# Patient Record
Sex: Female | Born: 1955
Health system: Southern US, Community
[De-identification: ages and names within clinical notes are randomized; demographics above are authoritative.]

## PROBLEM LIST (undated history)

## (undated) DIAGNOSIS — T4145XA Adverse effect of unspecified anesthetic, initial encounter: Secondary | ICD-10-CM

## (undated) DIAGNOSIS — E785 Hyperlipidemia, unspecified: Secondary | ICD-10-CM

## (undated) DIAGNOSIS — M542 Cervicalgia: Secondary | ICD-10-CM

## (undated) DIAGNOSIS — C8590 Non-Hodgkin lymphoma, unspecified, unspecified site: Secondary | ICD-10-CM

## (undated) DIAGNOSIS — F3289 Other specified depressive episodes: Secondary | ICD-10-CM

## (undated) DIAGNOSIS — G8929 Other chronic pain: Secondary | ICD-10-CM

## (undated) DIAGNOSIS — F329 Major depressive disorder, single episode, unspecified: Secondary | ICD-10-CM

## (undated) DIAGNOSIS — I10 Essential (primary) hypertension: Secondary | ICD-10-CM

## (undated) DIAGNOSIS — D72820 Lymphocytosis (symptomatic): Secondary | ICD-10-CM

## (undated) DIAGNOSIS — F419 Anxiety disorder, unspecified: Secondary | ICD-10-CM

## (undated) DIAGNOSIS — K429 Umbilical hernia without obstruction or gangrene: Secondary | ICD-10-CM

## (undated) DIAGNOSIS — R109 Unspecified abdominal pain: Secondary | ICD-10-CM

## (undated) DIAGNOSIS — T8859XA Other complications of anesthesia, initial encounter: Secondary | ICD-10-CM

## (undated) DIAGNOSIS — Z87442 Personal history of urinary calculi: Secondary | ICD-10-CM

## (undated) DIAGNOSIS — K5792 Diverticulitis of intestine, part unspecified, without perforation or abscess without bleeding: Secondary | ICD-10-CM

## (undated) DIAGNOSIS — M5030 Other cervical disc degeneration, unspecified cervical region: Secondary | ICD-10-CM

## (undated) DIAGNOSIS — M25532 Pain in left wrist: Secondary | ICD-10-CM

## (undated) DIAGNOSIS — N390 Urinary tract infection, site not specified: Secondary | ICD-10-CM

## (undated) HISTORY — DX: Other cervical disc degeneration, unspecified cervical region: M50.30

## (undated) HISTORY — DX: Major depressive disorder, single episode, unspecified: F32.9

## (undated) HISTORY — DX: Urinary tract infection, site not specified: N39.0

## (undated) HISTORY — DX: Other complications of anesthesia, initial encounter: T88.59XA

## (undated) HISTORY — DX: Adverse effect of unspecified anesthetic, initial encounter: T41.45XA

## (undated) HISTORY — DX: Non-Hodgkin lymphoma, unspecified, unspecified site: C85.90

## (undated) HISTORY — DX: Hyperlipidemia, unspecified: E78.5

## (undated) HISTORY — DX: Lymphocytosis (symptomatic): D72.820

## (undated) HISTORY — DX: Other specified depressive episodes: F32.89

## (undated) HISTORY — DX: Personal history of urinary calculi: Z87.442

---

## 1979-10-06 HISTORY — PX: MYOMECTOMY: SHX85

## 1979-10-06 HISTORY — PX: APPENDECTOMY: SHX54

## 1999-01-07 ENCOUNTER — Other Ambulatory Visit: Admission: RE | Admit: 1999-01-07 | Discharge: 1999-01-07 | Payer: Self-pay | Admitting: Obstetrics and Gynecology

## 1999-10-06 HISTORY — PX: ABDOMINAL HYSTERECTOMY: SHX81

## 1999-10-06 HISTORY — PX: LEFT OOPHORECTOMY: SHX1961

## 2000-04-23 ENCOUNTER — Encounter: Payer: Self-pay | Admitting: Emergency Medicine

## 2000-04-23 ENCOUNTER — Emergency Department (HOSPITAL_COMMUNITY): Admission: EM | Admit: 2000-04-23 | Discharge: 2000-04-23 | Payer: Self-pay | Admitting: Emergency Medicine

## 2000-10-24 ENCOUNTER — Inpatient Hospital Stay (HOSPITAL_COMMUNITY): Admission: AD | Admit: 2000-10-24 | Discharge: 2000-10-24 | Payer: Self-pay | Admitting: Obstetrics and Gynecology

## 2000-11-09 ENCOUNTER — Inpatient Hospital Stay (HOSPITAL_COMMUNITY): Admission: RE | Admit: 2000-11-09 | Discharge: 2000-11-11 | Payer: Self-pay | Admitting: Obstetrics and Gynecology

## 2000-11-09 ENCOUNTER — Encounter (INDEPENDENT_AMBULATORY_CARE_PROVIDER_SITE_OTHER): Payer: Self-pay | Admitting: Specialist

## 2000-11-09 ENCOUNTER — Encounter (INDEPENDENT_AMBULATORY_CARE_PROVIDER_SITE_OTHER): Payer: Self-pay

## 2001-01-26 ENCOUNTER — Other Ambulatory Visit: Admission: RE | Admit: 2001-01-26 | Discharge: 2001-01-26 | Payer: Self-pay | Admitting: Obstetrics and Gynecology

## 2001-04-04 ENCOUNTER — Encounter: Payer: Self-pay | Admitting: Emergency Medicine

## 2001-04-04 ENCOUNTER — Emergency Department (HOSPITAL_COMMUNITY): Admission: EM | Admit: 2001-04-04 | Discharge: 2001-04-04 | Payer: Self-pay | Admitting: Emergency Medicine

## 2001-06-12 ENCOUNTER — Emergency Department (HOSPITAL_COMMUNITY): Admission: EM | Admit: 2001-06-12 | Discharge: 2001-06-13 | Payer: Self-pay

## 2001-08-21 ENCOUNTER — Emergency Department (HOSPITAL_COMMUNITY): Admission: EM | Admit: 2001-08-21 | Discharge: 2001-08-21 | Payer: Self-pay | Admitting: Emergency Medicine

## 2001-08-21 ENCOUNTER — Encounter: Payer: Self-pay | Admitting: Emergency Medicine

## 2001-10-06 ENCOUNTER — Emergency Department (HOSPITAL_COMMUNITY): Admission: EM | Admit: 2001-10-06 | Discharge: 2001-10-06 | Payer: Self-pay | Admitting: Emergency Medicine

## 2002-02-02 ENCOUNTER — Emergency Department (HOSPITAL_COMMUNITY): Admission: EM | Admit: 2002-02-02 | Discharge: 2002-02-02 | Payer: Self-pay | Admitting: Emergency Medicine

## 2002-02-02 ENCOUNTER — Encounter: Payer: Self-pay | Admitting: Emergency Medicine

## 2002-04-28 ENCOUNTER — Encounter: Admission: RE | Admit: 2002-04-28 | Discharge: 2002-04-28 | Payer: Self-pay | Admitting: Internal Medicine

## 2002-06-13 ENCOUNTER — Encounter: Admission: RE | Admit: 2002-06-13 | Discharge: 2002-06-13 | Payer: Self-pay | Admitting: Internal Medicine

## 2002-06-23 ENCOUNTER — Encounter: Payer: Self-pay | Admitting: Emergency Medicine

## 2002-06-23 ENCOUNTER — Emergency Department (HOSPITAL_COMMUNITY): Admission: EM | Admit: 2002-06-23 | Discharge: 2002-06-23 | Payer: Self-pay | Admitting: Emergency Medicine

## 2002-08-25 ENCOUNTER — Emergency Department (HOSPITAL_COMMUNITY): Admission: EM | Admit: 2002-08-25 | Discharge: 2002-08-25 | Payer: Self-pay | Admitting: Emergency Medicine

## 2002-09-05 ENCOUNTER — Emergency Department (HOSPITAL_COMMUNITY): Admission: EM | Admit: 2002-09-05 | Discharge: 2002-09-05 | Payer: Self-pay | Admitting: Emergency Medicine

## 2002-09-05 ENCOUNTER — Encounter: Payer: Self-pay | Admitting: Emergency Medicine

## 2002-10-02 ENCOUNTER — Other Ambulatory Visit: Admission: RE | Admit: 2002-10-02 | Discharge: 2002-10-02 | Payer: Self-pay | Admitting: Internal Medicine

## 2002-11-11 ENCOUNTER — Encounter: Admission: RE | Admit: 2002-11-11 | Discharge: 2002-11-11 | Payer: Self-pay | Admitting: Internal Medicine

## 2002-11-18 ENCOUNTER — Encounter: Payer: Self-pay | Admitting: Internal Medicine

## 2002-11-18 ENCOUNTER — Encounter: Admission: RE | Admit: 2002-11-18 | Discharge: 2002-11-18 | Payer: Self-pay | Admitting: Internal Medicine

## 2003-01-10 ENCOUNTER — Encounter: Admission: RE | Admit: 2003-01-10 | Discharge: 2003-01-10 | Payer: Self-pay | Admitting: Family Medicine

## 2003-01-10 ENCOUNTER — Encounter: Payer: Self-pay | Admitting: Family Medicine

## 2003-02-12 ENCOUNTER — Emergency Department (HOSPITAL_COMMUNITY): Admission: EM | Admit: 2003-02-12 | Discharge: 2003-02-12 | Payer: Self-pay | Admitting: Emergency Medicine

## 2003-02-12 ENCOUNTER — Encounter: Payer: Self-pay | Admitting: Emergency Medicine

## 2003-09-20 ENCOUNTER — Emergency Department (HOSPITAL_COMMUNITY): Admission: EM | Admit: 2003-09-20 | Discharge: 2003-09-20 | Payer: Self-pay | Admitting: Emergency Medicine

## 2004-01-15 ENCOUNTER — Other Ambulatory Visit: Admission: RE | Admit: 2004-01-15 | Discharge: 2004-01-15 | Payer: Self-pay | Admitting: Obstetrics and Gynecology

## 2004-01-25 ENCOUNTER — Encounter: Admission: RE | Admit: 2004-01-25 | Discharge: 2004-01-25 | Payer: Self-pay | Admitting: Obstetrics and Gynecology

## 2004-05-07 ENCOUNTER — Emergency Department (HOSPITAL_COMMUNITY): Admission: EM | Admit: 2004-05-07 | Discharge: 2004-05-07 | Payer: Self-pay | Admitting: Emergency Medicine

## 2004-08-16 ENCOUNTER — Emergency Department (HOSPITAL_COMMUNITY): Admission: EM | Admit: 2004-08-16 | Discharge: 2004-08-16 | Payer: Self-pay | Admitting: Family Medicine

## 2004-12-08 ENCOUNTER — Emergency Department (HOSPITAL_COMMUNITY): Admission: EM | Admit: 2004-12-08 | Discharge: 2004-12-08 | Payer: Self-pay | Admitting: Emergency Medicine

## 2004-12-15 ENCOUNTER — Encounter: Admission: RE | Admit: 2004-12-15 | Discharge: 2004-12-15 | Payer: Self-pay | Admitting: Occupational Medicine

## 2004-12-23 ENCOUNTER — Encounter: Admission: RE | Admit: 2004-12-23 | Discharge: 2005-01-30 | Payer: Self-pay | Admitting: Occupational Medicine

## 2005-08-23 ENCOUNTER — Emergency Department (HOSPITAL_COMMUNITY): Admission: EM | Admit: 2005-08-23 | Discharge: 2005-08-23 | Payer: Self-pay | Admitting: Emergency Medicine

## 2005-08-25 ENCOUNTER — Inpatient Hospital Stay (HOSPITAL_COMMUNITY): Admission: AD | Admit: 2005-08-25 | Discharge: 2005-08-27 | Payer: Self-pay | Admitting: Internal Medicine

## 2005-10-05 HISTORY — PX: KIDNEY STONE SURGERY: SHX686

## 2006-12-09 ENCOUNTER — Inpatient Hospital Stay (HOSPITAL_COMMUNITY): Admission: EM | Admit: 2006-12-09 | Discharge: 2006-12-09 | Payer: Self-pay | Admitting: Emergency Medicine

## 2006-12-23 ENCOUNTER — Ambulatory Visit (HOSPITAL_COMMUNITY): Admission: RE | Admit: 2006-12-23 | Discharge: 2006-12-23 | Payer: Self-pay | Admitting: Urology

## 2007-01-30 ENCOUNTER — Emergency Department (HOSPITAL_COMMUNITY): Admission: EM | Admit: 2007-01-30 | Discharge: 2007-01-30 | Payer: Self-pay | Admitting: Emergency Medicine

## 2007-02-04 ENCOUNTER — Ambulatory Visit (HOSPITAL_BASED_OUTPATIENT_CLINIC_OR_DEPARTMENT_OTHER): Admission: RE | Admit: 2007-02-04 | Discharge: 2007-02-04 | Payer: Self-pay | Admitting: Urology

## 2007-05-10 ENCOUNTER — Encounter: Admission: RE | Admit: 2007-05-10 | Discharge: 2007-05-10 | Payer: Self-pay | Admitting: Emergency Medicine

## 2007-07-02 ENCOUNTER — Encounter: Admission: RE | Admit: 2007-07-02 | Discharge: 2007-07-02 | Payer: Self-pay | Admitting: Emergency Medicine

## 2007-07-14 ENCOUNTER — Encounter: Admission: RE | Admit: 2007-07-14 | Discharge: 2007-07-14 | Payer: Self-pay | Admitting: Emergency Medicine

## 2007-07-29 ENCOUNTER — Encounter: Admission: RE | Admit: 2007-07-29 | Discharge: 2007-07-29 | Payer: Self-pay | Admitting: Emergency Medicine

## 2007-08-29 ENCOUNTER — Ambulatory Visit (HOSPITAL_COMMUNITY): Admission: RE | Admit: 2007-08-29 | Discharge: 2007-08-29 | Payer: Self-pay | Admitting: Neurosurgery

## 2007-09-25 ENCOUNTER — Encounter: Admission: RE | Admit: 2007-09-25 | Discharge: 2007-09-25 | Payer: Self-pay | Admitting: Emergency Medicine

## 2007-10-06 DIAGNOSIS — G8929 Other chronic pain: Secondary | ICD-10-CM

## 2007-10-06 HISTORY — PX: NECK SURGERY: SHX720

## 2007-10-06 HISTORY — DX: Other chronic pain: G89.29

## 2007-10-10 ENCOUNTER — Encounter: Admission: RE | Admit: 2007-10-10 | Discharge: 2007-10-10 | Payer: Self-pay | Admitting: Emergency Medicine

## 2007-12-02 ENCOUNTER — Other Ambulatory Visit: Admission: RE | Admit: 2007-12-02 | Discharge: 2007-12-02 | Payer: Self-pay | Admitting: Obstetrics and Gynecology

## 2008-01-08 ENCOUNTER — Emergency Department (HOSPITAL_COMMUNITY): Admission: EM | Admit: 2008-01-08 | Discharge: 2008-01-09 | Payer: Self-pay | Admitting: Emergency Medicine

## 2010-06-08 ENCOUNTER — Inpatient Hospital Stay (HOSPITAL_COMMUNITY): Admission: AD | Admit: 2010-06-08 | Discharge: 2010-06-09 | Payer: Self-pay | Admitting: Obstetrics & Gynecology

## 2010-06-08 ENCOUNTER — Ambulatory Visit: Payer: Self-pay | Admitting: Family

## 2010-10-26 ENCOUNTER — Encounter: Payer: Self-pay | Admitting: Obstetrics & Gynecology

## 2010-10-26 ENCOUNTER — Encounter: Payer: Self-pay | Admitting: Emergency Medicine

## 2010-12-18 LAB — WET PREP, GENITAL: Yeast Wet Prep HPF POC: NONE SEEN

## 2010-12-18 LAB — URINE MICROSCOPIC-ADD ON

## 2010-12-18 LAB — URINE CULTURE
Colony Count: NO GROWTH
Culture  Setup Time: 201109051057
Culture: NO GROWTH

## 2010-12-18 LAB — URINALYSIS, ROUTINE W REFLEX MICROSCOPIC
Glucose, UA: NEGATIVE mg/dL
Nitrite: NEGATIVE
pH: 6 (ref 5.0–8.0)

## 2010-12-22 ENCOUNTER — Other Ambulatory Visit: Payer: Medicare Other

## 2010-12-22 ENCOUNTER — Ambulatory Visit (INDEPENDENT_AMBULATORY_CARE_PROVIDER_SITE_OTHER): Payer: Medicare Other | Admitting: Internal Medicine

## 2010-12-22 ENCOUNTER — Encounter: Payer: Self-pay | Admitting: Internal Medicine

## 2010-12-22 ENCOUNTER — Other Ambulatory Visit: Payer: Self-pay | Admitting: Internal Medicine

## 2010-12-22 DIAGNOSIS — N390 Urinary tract infection, site not specified: Secondary | ICD-10-CM | POA: Insufficient documentation

## 2010-12-22 DIAGNOSIS — Z202 Contact with and (suspected) exposure to infections with a predominantly sexual mode of transmission: Secondary | ICD-10-CM

## 2010-12-22 DIAGNOSIS — M1990 Unspecified osteoarthritis, unspecified site: Secondary | ICD-10-CM

## 2010-12-22 DIAGNOSIS — M5030 Other cervical disc degeneration, unspecified cervical region: Secondary | ICD-10-CM | POA: Insufficient documentation

## 2010-12-22 DIAGNOSIS — F329 Major depressive disorder, single episode, unspecified: Secondary | ICD-10-CM | POA: Insufficient documentation

## 2010-12-22 DIAGNOSIS — E785 Hyperlipidemia, unspecified: Secondary | ICD-10-CM

## 2010-12-22 DIAGNOSIS — Z87442 Personal history of urinary calculi: Secondary | ICD-10-CM | POA: Insufficient documentation

## 2010-12-22 DIAGNOSIS — M159 Polyosteoarthritis, unspecified: Secondary | ICD-10-CM | POA: Insufficient documentation

## 2010-12-22 LAB — BASIC METABOLIC PANEL
CO2: 28 mEq/L (ref 19–32)
Calcium: 10 mg/dL (ref 8.4–10.5)
Chloride: 101 mEq/L (ref 96–112)
GFR: 97.37 mL/min (ref >60.00)
Glucose, Bld: 106 mg/dL — ABNORMAL HIGH (ref 70–99)

## 2010-12-22 LAB — CBC WITH DIFFERENTIAL/PLATELET
Eosinophils Absolute: 0.1 10*3/uL (ref 0.0–0.7)
Eosinophils Relative: 1.1 % (ref 0.0–5.0)
HCT: 40.4 % (ref 36.0–46.0)
Lymphocytes Relative: 51.9 % — ABNORMAL HIGH (ref 12.0–46.0)
Lymphs Abs: 3.4 10*3/uL (ref 0.7–4.0)
Monocytes Relative: 9.8 % (ref 3.0–12.0)
Neutrophils Relative %: 36.5 % — ABNORMAL LOW (ref 43.0–77.0)
Platelets: 384 10*3/uL (ref 150.0–400.0)
RDW: 13.8 % (ref 11.5–14.6)

## 2010-12-22 LAB — LIPID PANEL
Cholesterol: 255 mg/dL — ABNORMAL HIGH (ref 0–200)
HDL: 64.1 mg/dL (ref >39.00)

## 2010-12-22 LAB — CONVERTED CEMR LAB: HIV: NONREACTIVE

## 2010-12-22 LAB — TSH: TSH: 0.77 u[IU]/mL (ref 0.35–5.50)

## 2010-12-23 ENCOUNTER — Encounter: Payer: Self-pay | Admitting: Internal Medicine

## 2010-12-31 ENCOUNTER — Telehealth: Payer: Self-pay | Admitting: *Deleted

## 2010-12-31 NOTE — Telephone Encounter (Signed)
Pt left vm ? About lovastatin, Left mess to call office back.  Per last ov notes MD was going to send in lovastatin 40mg  1 qd. Need to get pharm info from pt so RX can be sent in.

## 2011-01-01 NOTE — Assessment & Plan Note (Signed)
Summary: new / medicare / #/cd   Vital Signs:  Patient profile:   55 year old female Height:      66 inches Weight:      156 pounds BMI:     25.27 O2 Sat:      98 % on Room air Temp:     98.5 degrees F oral Pulse rate:   77 / minute BP sitting:   120 / 80  (left arm) Cuff size:   regular  Vitals Entered By: Bill Salinas CMA (December 22, 2010 2:26 PM)  O2 Flow:  Room air CC: New pt here to est care with primary/ ab   Primary Care Provider:  Illene Regulus  CC:  New pt here to est care with primary/ ab.  History of Present Illness: Ms. Lauren Orozco is well known to me due to caring for her family and having seen her in the past. She is here to reestablish now that she has coverage through disability.  She has a mass in the right supraclavicular region.  She did walk 3-4 miles and she subsequently had marked leg pain.   She has chronic back pain and is currently on disability due to the orthopedic issues.  Preventive Screening-Counseling & Management  Alcohol-Tobacco     Alcohol drinks/day: 0     Smoking Status: current     Packs/Day: 15 cigs per day  Caffeine-Diet-Exercise     Caffeine use/day: 1 cup per day     Does Patient Exercise: yes     Type of exercise: walking     Times/week: 3  Hep-HIV-STD-Contraception     Hepatitis Risk: no risk noted     HIV Risk: risk noted     HIV Risk Counseling: to avoid increased HIV risk     STD Risk: risk noted     STD Risk Counseling: to avoid increased STD risk     Dental Visit-last 6 months no     SBE monthly: yes     Sun Exposure-Excessive: no  Safety-Violence-Falls     Seat Belt Use: yes     Helmet Use: n/a     Firearms in the Home: no firearms in the home     Smoke Detectors: yes     Violence in the Home: no risk noted     Sexual Abuse: no     Fall Risk: no fall risk  Comments: She does have unprotected intercourse. She did have treatment for chlymdia - September '11 but final alb repot negative for chlamydia an dGC.  No HIV test done.       Sexual History:  currently monogamous.        Drug Use:  never.        Blood Transfusions:  no.    Current Medications (verified): 1)  None  Allergies (verified): 1)  ! Floxin  Past History:  Past Medical History: DEPRESSION (ICD-311) UTI'S, CHRONIC (ICD-599.0) NEPHROLITHIASIS, HX OF (ICD-V13.01) HYPERLIPIDEMIA (ICD-272.4) DISC DISEASE, CERVICAL (ICD-722.4) LOC OSTEOARTHROS NOT SPEC PRIM/SEC UNSPEC SITE (ICD-715.30)   Phsyician Roster:                NS-- Dr. 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  Smoking Status:  current Packs/Day:  15 cigs per day Caffeine use/day:  1 cup per day Does Patient Exercise:  yes Sun Exposure-Excessive:  no Seat Belt Use:  yes Fall Risk:  no fall risk Hepatitis Risk:  no risk noted HIV Risk:  risk noted STD Risk:  risk noted Dental Care w/in 6 mos.:  no Drug Use:  never Blood Transfusions:  no Sexual History:  currently monogamous  Review of Systems  The patient denies anorexia, fever, weight loss, weight gain, decreased hearing, hoarseness, syncope, dyspnea on exertion, prolonged cough, hemoptysis, abdominal pain, severe indigestion/heartburn, incontinence, suspicious skin lesions, transient blindness, depression, unusual weight change, enlarged lymph nodes, and breast masses.    Physical Exam  General:  alert, well-developed, well-nourished, well-hydrated, normal appearance, and healthy-appearing.   Head:  normocephalic and atraumatic.   Eyes:  vision grossly  intact, pupils equal, pupils round, pupils react to accomodation, and corneas and lenses clear.   Ears:  External ear exam shows no significant lesions or deformities.  Otoscopic examination reveals clear canals, tympanic membranes are intact bilaterally without bulging, retraction, inflammation or discharge. Hearing is grossly normal bilaterally. Nose:  no external deformity and no external erythema.   Mouth:  missing many teeth: molars and premolars. No buccal lesions, posterior pharynx is clear Neck:  supple, full ROM, no thyromegaly, and no carotid bruits.   Chest Wall:  No deformities, masses, or tenderness noted. Breasts:  No mass, nodules, thickening, tenderness, bulging, retraction, inflamation, nipple discharge or skin changes noted.   Lungs:  normal respiratory effort, normal breath sounds, no crackles, and no wheezes.   Heart:  normal rate, regular rhythm, no JVD, and no HJR.   Abdomen:  soft, non-tender, normal bowel sounds, no guarding, and no hepatomegaly.  Umbilical hernia, soft and reducible but tender to reducing Genitalia:  deferred to pelvic exam in Sept '11  Msk:  normal ROM, no joint tenderness, no joint swelling, no joint warmth, and no joint instability.   Pulses:  2+ radial pulses Extremities:  No clubbing, cyanosis, edema, or deformity noted with normal full range of motion of all joints.   Neurologic:  alert & oriented X3, cranial nerves II-XII intact, strength normal in all extremities, gait normal, and DTRs symmetrical and normal.   Skin:  turgor normal, color normal, and no ulcerations.   Cervical Nodes:  no anterior cervical adenopathy and no posterior cervical adenopathy.   Axillary Nodes:  no R axillary adenopathy and no L axillary adenopathy.   Psych:  Oriented X3, memory intact for recent and remote, normally interactive, and good eye contact.     Impression & Recommendations:  Problem # 1:  SEXUALLY TRANSMITTED DISEASE, EXPOSURE TO (ICD-V01.6) Counselled  about the absolute need to [place] barrier protection (condoms) unless she can bet her life on the fidelity of any partner!  Plan - HIV screen today.  Orders: T-HIV Antibody  (Reflex) ([id number])  Addendum - HIV screen in NEGATIVE  Problem # 2:  DEPRESSION (ICD-311) Seems to be doing well without signs of vegetive depression at today's exam.  Problem # 3:  HYPERLIPIDEMIA (ICD-272.4)  For lab with recommendations to follow.  Orders: TLB-Lipid Panel (80061-LIPID) TLB-TSH (Thyroid Stimulating Hormone) (84443-TSH)  addendum - LDL is 170, way above goal of 130 or less.  Plan - recommend lovastatin 40mg  once a day - rx to pharmacy           follow-up lab in 4 weeks.   Her updated medication list for this problem includes:    Lovastatin 40 Mg Tabs (Lovastatin) .Marland Kitchen... 1po q pm for cholesterol  Problem # 4:  Preventive Health Care (ICD-V70.0) General labs today. She had a pelvic done in Sept '11 at ED - will be due for pelvic/pap in Sept '12.  Complete Medication List: 1)  Lovastatin 40 Mg Tabs (Lovastatin) .Marland Kitchen.. 1po q pm for cholesterol  Other Orders: TLB-BMP (Basic Metabolic Panel-BMET) (80048-METABOL) TLB-CBC Platelet - w/Differential (85025-CBCD) Patient: Lauren Orozco Note: All result statuses are Final unless otherwise noted.  Tests: (1) Lipid Panel (LIPID)   Cholesterol          [H]  255 mg/dL                   1-191     ATP III Classification            Desirable:  < 200 mg/dL                    Borderline High:  200 - 239 mg/dL               High:  > = 240 mg/dL   Triglycerides             73.0 mg/dL                  4.7-829.5     Normal:  <150 mg/dL     Borderline High:  621 - 199 mg/dL   HDL                       30.86 mg/dL                 >57.84   VLDL Cholesterol          14.6 mg/dL                  6.9-62.9  CHO/HDL Ratio:  CHD Risk                             4                    Men          Women     1/2 Average Risk     3.4          3.3     Average Risk           5.0          4.4     2X Average Risk          9.6          7.1     3X Average Risk          15.0          11.0                           Tests: (2) BMP (METABOL)   Sodium                    137 mEq/L  135-145   Potassium            [H]  5.3 mEq/L                   3.5-5.1   Chloride                  101 mEq/L                   96-112   Carbon Dioxide            28 mEq/L                    19-32   Glucose              [H]  106 mg/dL                   04-54   BUN                       13 mg/dL                    0-98   Creatinine                0.8 mg/dL                   1.1-9.1   Calcium                   10.0 mg/dL                  4.7-82.9   GFR                       97.37 mL/min                >60.00  Tests: (3) CBC Platelet w/Diff (CBCD)   White Cell Count          6.5 K/uL                    4.5-10.5   Red Cell Count            4.37 Mil/uL                 3.87-5.11   Hemoglobin                13.7 g/dL                   56.2-13.0   Hematocrit                40.4 %                      36.0-46.0   MCV                       92.3 fl                     78.0-100.0   MCHC                      33.9 g/dL                   86.5-78.4   RDW  13.8 %                      11.5-14.6   Platelet Count            384.0 K/uL                  150.0-400.0   Neutrophil %         [L]  36.5 %                      43.0-77.0   Lymphocyte %         [H]  51.9 %                      12.0-46.0   Monocyte %                9.8 %                       3.0-12.0   Eosinophils%              1.1 %                       0.0-5.0   Basophils %               0.7 %                       0.0-3.0   Neutrophill Absolute      2.4 K/uL                    1.4-7.7   Lymphocyte Absolute       3.4 K/uL                    0.7-4.0   Monocyte Absolute         0.6 K/uL                    0.1-1.0  Eosinophils, Absolute                             0.1 K/uL                    0.0-0.7    Basophils Absolute        0.0 K/uL                    0.0-0.1  Tests: (4) TSH (TSH)   FastTSH                   0.77 uIU/mL                 0.35-5.50  Tests: (5) Cholesterol LDL - Direct (DIRLDL)  Cholesterol LDL - Direct                             169.8 mg/dL     Optimal:  <604 mg/dL     Near or Above Optimal:  100-129 mg/dL     Borderline High:  540-981 mg/dL     High:  191-478 mg/dL     Very High:  >295 mg/dL   Orders Added: 1)  TLB-Lipid Panel [80061-LIPID] 2)  TLB-BMP (Basic Metabolic Panel-BMET) [80048-METABOL] 3)  TLB-CBC Platelet -  w/Differential [85025-CBCD] 4)  TLB-TSH (Thyroid Stimulating Hormone) [84443-TSH] 5)  T-HIV Antibody  (Reflex) [[id number]] 6)  New Patient Level IV [14782]

## 2011-01-02 MED ORDER — LOVASTATIN 40 MG PO TABS: 40.0000 mg | ORAL_TABLET | Freq: Every day | ORAL | Status: DC

## 2011-01-02 NOTE — Telephone Encounter (Signed)
Spoke w/pt,   1.She received letter w/her lab results but no rx was sent in. Will send in now. It was noted she needs labs in 4 wks after starting meds, please put in orders.   2. She was dx with low Vit D by previous MD. Wants to know if MD would check Vit D level at same time as labs in 4 wks?

## 2011-01-02 NOTE — Telephone Encounter (Signed)
Addended by: Lamar Sprinkles on: 01/02/2011 11:43 AM   Modules accepted: Orders

## 2011-02-20 NOTE — Op Note (Signed)
NAME:  Lauren Orozco, SPADACCINI            ACCOUNT NO.:  [id number]   MEDICAL RECORD NO.:  [id number]          PATIENT TYPE:  AMB   LOCATION:  [facility]                         FACILITY:  [facility]   PHYSICIAN:  Mark C. Vernie Ammons, M.D.  DATE OF BIRTH:  1956-08-19   DATE OF PROCEDURE:  02/04/2007  DATE OF DISCHARGE:                               OPERATIVE REPORT   PREOPERATIVE DIAGNOSIS:  Left ureteral stone.   POSTOPERATIVE DIAGNOSIS:  Left ureteral stone.   PROCEDURES:  1. Cystoscopy.  2. Left retrograde pyelography with interpretation.  3. Left ureteroscopic stone manipulation with laser lithotripsy and      electrohydraulic lithotripsy.  4. Left ureteral stent placement.   SURGEON:  Mark C. Vernie Ammons, M.D.  [name], MD   ANESTHESIA:  General.   SPECIMEN:  Stone fragments.   BLOOD LOSS:  Minimal.   COMPLICATIONS:  None.   DRAINS:  Six (6)-French 24 cm double-J ureteral stent.   DISPOSITION:  Stable to post anesthesia care unit.   INDICATIONS FOR PROCEDURE:  The patient is a 55 year old female who had  recent left-sided flank pain.  CT scan in the emergency room has shown a  9 mm x 7 mm mid proximal ureteral stone.  She was counseled regarding  treatment options.  Lithotripsy was not an option as this was not  visible on KUB x-ray.  She has therefore elected to proceed with  ureteroscopy after a full discussion of benefits and risks of the  procedure.  Her consent was obtained.   DESCRIPTION OF PROCEDURE:  The patient brought to the operating room and  was properly identified.  Time-out was performed to confirm correct  patient and procedure and side.  She was administered general  anesthesia, given preoperative antibiotics and then placed in the dorsal  lithotomy position, prepped and draped in the sterile fashion.  Care was  taken to properly pad and position to minimize risk of DVT or  compartment syndrome.   We then performed cystoscopy using 12 degrees lens and 22-French  sheath.  The bladder was normal without evidence of mucosal abnormality, tumor or  stone.  Both ureteral orifices were in the normal anatomic position and  effluxing clear urine.   A 6-French end-hole catheter was introduced in the left ureter.  Contrast was injected to perform retrograde pyelogram.  The mid to  distal ureter was normal without evidence of a filling defect.  There  was narrowing in the proximal ureter with a filling defect consistent  with a stone, we therefore elected to proceed with ureteroscopic stone  manipulation.   A wire was then placed through the catheter into the ureter and was  manipulated past the stone into the collecting system of the kidney.  The cystoscope was removed and a semi rigid ureteroscope was introduced  into the ureter after dilation of the ureteral orifice with an 8-French  serial dilator.  The scope then passed easily.  The ureter was normal up  to the level of the stone.  At this point there was a significant amount  of edema and the stone appeared to be impacted.  We were able to get a  good view of the stone and then performed laser lithotripsy using the  holmium laser.  This was initially performed but the stone was unable to  be fully fragmented using the laser due to a technical malfunction with  the generator.  We therefore elected to continued to fragment the stone  using the electrohydraulic lithotriptor.  The 3-French EHL was placed  through the device and the stone was fragmented.  We were able to basket  a few small stone fragments.  The remaining small fragments passed  through the ureter and there were a couple that migrated proximally into  the kidney.  These fragments were not significant and we elected not to  pursue these further at this time.  At this point we decided to  terminate the procedure.  The scope was removed.  Cystoscope was then  reintroduced and a 6-French 24 cm double-J ureteral stent was advanced  under  cystoscopic and fluoroscopic guidance and proper position was  confirmed.  The bladder was emptied and the procedure was terminated.  The patient was awoken from anesthesia and transported to recovery room  in stable condition.  There was no complications.  Please note, Dr.  Vernie Ammons was present and participated in all aspects of this procedures  as the primary surgeon.   The patient will continue with her stent and will be seen in Dr.  Margrett Rud clinic in approximately 1 week.  She was given a prescription  of Keflex x3 days, Vicodin #30 with no refills, Ditropan #15 with one  refill for p.r.n. bladder spasm.     ______________________________  [name], MD      Lauren Orozco. Vernie Ammons, M.D.  Electronically Signed    JH/MEDQ  D:  02/04/2007  T:  02/04/2007  Job:  [id number]   cc:   Lauren Orozco. Vernie Ammons, M.D.  Fax: [phone]

## 2011-02-20 NOTE — Discharge Summary (Signed)
[facility]  Patient:    Lauren Orozco, Lauren Orozco                   MRN: [id number] Adm. Date:  [id number] Disc. Date: [id number] Attending:  Brynda Peon                           Discharge Summary  DISCHARGE DIAGNOSIS:  Menorrhagia, pelvic pain, fibroids, and a left adnexal mass.  PROCEDURES:  Total abdominal hysterectomy, left salpingo-oophorectomy.  HISTORY OF PRESENT ILLNESS:  This is a 55 year old, single, black female, gravida 3, para 3, with incapacitating menorrhagia, clotting, right groin pain, and an ultrasound showing multiple fibroids, and a 4.3 cm left ovarian cyst.  HOSPITAL COURSE:  On November 09, 2000, she underwent a total abdominal hysterectomy and left salpingo-oophorectomy.  Pathology report showed leiomyoma uteri with an endometrial polyp and a benign follicular cyst and fibrous adhesions of the left ovary and a small subserosal adenomatoid tumor on the uterus.  Postoperatively, she did very well, had an uneventful course and was sent home on her second postoperative day afebrile and in good condition.  DISCHARGE INSTRUCTIONS:  She was given discharge instructions regarding pelvic rest and to follow up in the office.  LABORATORY AND ACCESSORY DATA:  Labs showed admission hemoglobin and hematocrit of 9.9 and 29.8, and discharge 9.0 and 27.5, respectively. DD:  12/20/00 TD:  12/21/00 Job: [id number]

## 2011-02-20 NOTE — Discharge Summary (Signed)
Lauren Orozco, Lauren Orozco            ACCOUNT NO.:  [id number]   MEDICAL RECORD NO.:  [id number]          PATIENT TYPE:  INP   LOCATION:  5012                         FACILITY:  [facility]   PHYSICIAN:  Nelma Rothman, MD   DATE OF BIRTH:  31-Mar-1956   DATE OF ADMISSION:  08/25/2005  DATE OF DISCHARGE:  08/27/2005                                 DISCHARGE SUMMARY   PRIMARY CARE PHYSICIAN:  Reuben Likes, M.D.   DISCHARGE DIAGNOSES:  1.  Acute bronchitis.  2.  Depression.   PROCEDURES:  Chest x-ray on admission did not demonstrate any evidence of  infiltrate or acute cardiopulmonary disease.   LABORATORY DATA:  HIV ELISA was negative.  White blood cell count on  admission was 7.3.  Liver function tests demonstrated a mild transaminitis  with AST of 44, ALT of 76, and alkaline phosphatase of 214.  Urinalysis was  essentially negative.   HOSPITAL COURSE:  Ms. Stahlman is a very pleasant 55 year old female who  was admitted directly from her primary care physician's office after  continuing to complain of fever, cough, and shortness of breath, failing  outpatient therapy.  Briefly, she was tried on Avelox as an outpatient,  however, developed a rash likely secondary to fluoroquinolone allergy, and  then presented to her primary care physician's office for further  evaluation.  Given that she was still febrile and persistently coughing, she  was admitted to the hospital.  She was started on IV ceftriaxone and  azithromycin.  However, chest x-ray demonstrated no evidence of infiltrates,  so ceftriaxone was subsequently discontinued and she was continued on  azithromycin alone.  For 24 hours prior to discharge, she remained afebrile  on azithromycin.  She was feeling much better with oxygen saturation of 97%  on room air on the morning of discharge and wished to be home for  Thanksgiving.   The patient does report history of being around someone who also has a  chronic cough and she  requested testing for both HIV and TB which were  initiated.  Her HIV ELISA was negative.  PPD was placed on November 22, but  will need to be read either on the 24th or 25th, which is this Friday or  Saturday, to be followed up.   Finally, on admission labs, she had a mild transaminitis.  She should have  liver function tests rechecked by her primary care physician in  approximately three months to ensure resolution of this.  This could be  related to recent antibiotic.  However, should these not return to normal,  could consider viral hepatitis serologies.   DISCHARGE MEDICATIONS:  1.  Azithromycin 500 mg p.o. daily x5 more days.  2.  Resume Wellbutrin XL 150 mg p.o. daily.   DISCHARGE INSTRUCTIONS:  The patient is to present either her primary care  physician's office if open tomorrow or an urgent care either tomorrow or the  next day to have a PPD read that was placed on November 22.  She will follow  up with her primary care physician, Dr. Lorenz Coaster, as previously scheduled.  ______________________________  Nelma Rothman, MD     RAR/MEDQ  D:  08/27/2005  T:  08/27/2005  Job:  [id number]   cc:   Reuben Likes, M.D.  Fax: [phone]

## 2011-02-20 NOTE — Discharge Summary (Signed)
Lauren Orozco, Lauren Orozco            ACCOUNT NO.:  [id number]   MEDICAL RECORD NO.:  [id number]          PATIENT TYPE:  INP   LOCATION:  5522                         FACILITY:  [facility]   PHYSICIAN:  Martina Sinner, MD DATE OF BIRTH:  1956/06/13   DATE OF ADMISSION:  12/08/2006  DATE OF DISCHARGE:  12/09/2006                               DISCHARGE SUMMARY   ADMISSION AND DISCHARGE DIAGNOSIS:  Left ureteral stone.   CHIEF COMPLAINT ON ADMISSION:  Left flank pain.   HISTORY OF PRESENT ILLNESS:  Lauren Orozco is a 55 year old woman  admitted for left flank pain with nausea but no vomiting.  She was  having flank pain.  She is admitted for pain control.   SOCIAL HISTORY:  [place]ives locally.   PAST MEDICAL HISTORY:  Previous hysterectomy and appendectomy.   MEDICATIONS:  None.   ALLERGIES:  Cipro.   FAMILY HISTORY:  Brother has had a stone.   REVIEW OF SYSTEMS:  The rest of the review of systems is negative.   Several hours after being admitted to the hospital, she felt very  comfortable.  Her white blood count was mildly elevated.  Her creatinine  was 1.0.   She had a CT scan which showed a 12 x 5 mm left proximal stone with mild  hydronephrosis.   She was afebrile and feeling well.  She was discharged home, and she was  scheduled for a lithotripsy early in the week.           ______________________________  Martina Sinner, MD  Electronically Signed     SAM/MEDQ  D:  03/02/2007  T:  03/02/2007  Job:  [id number]

## 2011-02-20 NOTE — H&P (Signed)
NAME:  Lauren Orozco, Lauren Orozco            ACCOUNT NO.:  [id number]   MEDICAL RECORD NO.:  [id number]          PATIENT TYPE:  INP   LOCATION:  5012                         FACILITY:  [facility]   PHYSICIAN:  Toby L. Fugate, D.O.   DATE OF BIRTH:  06-10-1956   DATE OF ADMISSION:  08/25/2005  DATE OF DISCHARGE:                                HISTORY & PHYSICAL   PRIMARY CARE PHYSICIAN:  Reuben Likes, M.D.   CHIEF COMPLAINT:  Fevers, chills, cough, and myalgias.   HISTORY OF PRESENT ILLNESS:  Lauren Orozco is a 55 year old African American  female directly admitted from her primary care physician's office due to the  above mentioned symptoms.  She has been having fevers and chills now for  about 3-4 days.  She has gradually developed a cough that is productive of  brownish sputum.  She has also developed generalized myalgias.  She says  that her illness initially began with a runny nose.  She has had fevers up  as high as 102 measured at home.  On August 23, 2005, she presented to the  [facility] where she was diagnosed with pneumonia.  At that time  no chest x-ray was obtained.  The patient was prescribed Avelox and  discharged home.  Her symptoms have worsened over the past few days.  In  addition, she has developed a diffuse macular rash.  Of note, the patient  has a known history of allergy to ciprofloxacin.  Currently, she feels  feverish.  She continues to cough up brown sputum.  She also complains of  diffuse muscle aches.   PAST MEDICAL HISTORY/PAST SURGICAL HISTORY:  1.  Menorrhagia, fibroids, benign polyp.  2.  Status post hysterectomy in 2002.  3.  Depression.   MEDICATIONS:  1.  Avelox 400 mg p.o. every day.  2.  Wellbutrin XL 150 mg p.o. daily.   ALLERGIES:  CIPRO.   SOCIAL HISTORY:  She smokes one half pack of cigarettes per day.  She denies  alcohol and IV drug abuse.  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   FAMILY HISTORY:   Father died due to coronary artery disease.  Mother died  due to multiple sclerosis.   REVIEW OF SYSTEMS:  A complete 12-point review of systems was obtained.  The  review is negative except for that stated in the HPI.   PHYSICAL EXAMINATION:  VITAL SIGNS:  Temperature 100.8, pulse 99,  respiratory rate 16, blood pressure 128/82.  HEENT:  Pupils were equally round and reactive to light.  Extraocular  muscles were intact.  There is no scleral icterus.  Tympanic membranes  clear.  Oropharynx slightly erythematous and moist.  NECK:  No JVD.  No carotid bruit.  No adenopathy.  HEART:  Regular rate and rhythm.  No murmurs, rubs, or gallops.  LUNGS:  Scattered rhonchi, otherwise clear.  ABDOMEN:  Positive bowel sounds.  Nontender, nondistended.  No  hepatosplenomegaly.  EXTREMITIES:  No edema, clubbing, or cyanosis.  NEUROLOGIC:  Cranial nerves II-XII intact.  No focal deficits.  DTRs 2/4 in  all  extremities.  Strength is 5/5 in all extremities.   ASSESSMENT/PLAN:  1.  Cough, sputum production, fever, and myalgias.  The differential      includes pneumonia, versus bronchitis, versus influenza, versus other.      I will admit the patient to a general medicine bed.  I will place the      patient in droplet isolation until influenza can be ruled out.  I will      initiate Rocephin 1 gram intravenously every 24 hours and azithromycin      500 mg orally every 24 hours to cover community-acquired      pneumonia/bronchitis.  I will provide the patient with Tylenol as      needed.  In addition, the patient will receive Robitussin as needed.  I      will obtain sputum cultures, blood cultures, UA, and urine culture.  As      mentioned above, an influenza nasal swab will be obtained.  A chest x-      ray (PA and lateral) will be obtained as well.  The patient will need      basic labs including CBC and CMP.  2.  Rash.  This is most likely due to Avelox.  The patient has a known      history of  allergy (rash) to Cipro.  I will provide Benadryl as needed.  3.  Depression.  I will continue the patient on Wellbutrin.  4.  This patient is a full code.      Toby L. Fugate, D.O.  Electronically Signed     TLF/MEDQ  D:  08/25/2005  T:  08/25/2005  Job:  [id number]

## 2011-02-20 NOTE — Op Note (Signed)
[facility]  Patient:    Lauren Orozco, Lauren Orozco                   MRN: [id number] Proc. Date: 11/09/00 Adm. Date:  [id number] Attending:  Brynda Peon                           Operative Report  PREOPERATIVE DIAGNOSIS:  Menorrhagia, uterine fibroids, left ovarian cyst.  POSTOPERATIVE DIAGNOSIS:  Menorrhagia, uterine fibroids, left ovarian cyst. Pathology pending.  OPERATION PERFORMED:  Total abdominal hysterectomy, left salpingo-oophorectomy.  SURGEON:  Cynthia P. Ashley Royalty, M.D.  ASSISTANT:  Andres Ege, M.D.  ANESTHESIA:  General endotracheal.  ESTIMATED BLOOD LOSS:  300 cc.  COMPLICATIONS:  None.  DESCRIPTION OF PROCEDURE:  The patient was taken to the operating room and after the induction of adequate general endotracheal anesthesia, was prepped and draped in the usual fashion and Foley catheter placed.  A Pfannenstiel incision was made at the site of the previous Pfannenstiel.  The incision was carried down to the fascia using a deep knife. The fascia was nicked and opened transversely.  Kocher was used to grasp the fascial margins and it was separated from the underlying rectus muscles with sharp dissection.  The rectus muscles were separated sharply in the midline. The underlying peritoneum was elevated between hemostats and entered atraumatically.  The peritoneum was opened vertically.  The abdomen was explored.  The upper abdomen was normal.  The diaphragms were smooth.  The liver edge was smooth. The gallbladder contained no stones.  There was no periaortic or pelvic adenopathy in the pelvis.  The uterus was enlarged.  There was an approximately 4 cm myoma in the left broad ligament.  The left ovary had a clear cyst approximately 4 cm in diameter.  On the right, the ovary was adherent to the posterior leaf of the broad ligament.  The tube appeared normal.  The uterus was grasped at the cornu with [facility].  The round  ligaments were identified, elevated, sutured, cut and held.  The anterior leaf of the broad ligament was taken down sharply.  The bladder was taken down sharply off the cervix.  The pedicle containing the utero-ovarian ligament and the tube was doubly clamped, cut and doubly tied on the patients right using 0 Vicryl. On the left the ureter was identified and then the infundibulopelvic ligament was skeletonized, doubly clamped, doubly tied with 0 Vicryl and cut.  The uterine arteries were skeletonized, clamped, cut and tied with 0 Vicryl.  The cardinal ligament was taken down on both sides with straight Heaneys, clamping, cutting and tying in sequence.  The uterosacral ligaments were taken as separate pedicles and held.  The vagina was entered and using Satinsky scissors, specimen was removed.  The vaginal margins were grasped with Kochers.  Angled sutures were placed of 0 Vicryl on each angle of the vagina. The vagina was closed with interrupted figure-of-eight sutures of 0 Vicryl. The pelvis was irrigated.  Hemostasis was achieved using the Bovie on small bleeders on the vaginal margin.  The pedicles were all inspected and were hemostatic.  The right ovary was tied up to the side wall to keep it out of the cul-de-sac and help prevent dyspareunia.  It was attached to the pedicle that was the round ligament.  The pelvis being hemostatic, the bowel was allowed to return to its anatomic position.  The packs were removed. The peritoneum as grasped  with hemostats and closed in running fashion using 2-0 Vicryl.  The fascia was closed in running fashion using 0 Vicryl going from each angle to the midline.  Hemostasis was achieved in the subcutaneous tissues with the Bovie.  The subcutaneous tissues were irrigated and skin was closed with staples.  The patient tolerated the procedure well and went in ____________ good condition to post anesthesia recovery. DD:  11/09/00 TD:  11/10/00 Job:  [id number]

## 2011-03-27 ENCOUNTER — Encounter: Payer: Self-pay | Admitting: *Deleted

## 2011-03-27 ENCOUNTER — Ambulatory Visit (INDEPENDENT_AMBULATORY_CARE_PROVIDER_SITE_OTHER): Payer: Medicare Other | Admitting: Internal Medicine

## 2011-03-27 VITALS — BP 128/82 | HR 97 | Temp 98.6°F | Ht 66.0 in | Wt 160.8 lb

## 2011-03-27 DIAGNOSIS — B009 Herpesviral infection, unspecified: Secondary | ICD-10-CM

## 2011-03-27 DIAGNOSIS — K130 Diseases of lips: Secondary | ICD-10-CM

## 2011-03-27 DIAGNOSIS — B001 Herpesviral vesicular dermatitis: Secondary | ICD-10-CM

## 2011-03-27 MED ORDER — MUPIROCIN 2 % EX OINT: TOPICAL_OINTMENT | CUTANEOUS | Status: AC

## 2011-03-27 MED ORDER — LOVASTATIN 40 MG PO TABS: 40.0000 mg | ORAL_TABLET | Freq: Every day | ORAL | Status: DC

## 2011-03-27 MED ORDER — DOXYCYCLINE HYCLATE 100 MG PO TABS: 100.0000 mg | ORAL_TABLET | Freq: Two times a day (BID) | ORAL | Status: AC

## 2011-03-27 NOTE — Patient Instructions (Signed)
It was good to see you today. We have "drained" your lip blister today Doxycycline antibiotics and bactroban ointment to use on lip sore - Your prescription(s) have been submitted to your pharmacy. Please take as directed and contact our office if you believe you are having problem(s) with the medication(s). Keep clean - wask with warm soapy water 2x/day and warm compress 2-3x/day as discussed If blister is not improved with these treatments, call for re-evalution as needed

## 2011-03-27 NOTE — Progress Notes (Signed)
  Subjective:    Patient ID: Lauren Orozco, female    DOB: 02-26-1956, 55 y.o.   MRN: [id number]  HPI  complains of fever blister Onset 3 days ago Located left lower corner of mouth - No hx same, denies trauma Progressive swelling and pain - lat night blister became "white" and extremely tender No fever No other skin rash/blisters  PMH reviewed  Review of Systems  Constitutional: Negative for fever, fatigue and unexpected weight change.  HENT: Negative for sore throat, mouth sores, trouble swallowing and dental problem.   Respiratory: Negative for shortness of breath.   Musculoskeletal: Negative for myalgias.  Neurological: Negative for headaches.       Objective:   Physical Exam BP 128/82  Pulse 97  Temp(Src) 98.6 F (37 C) (Oral)  Ht 5\' 6"  (1.676 m)  Wt 160 lb 12.8 oz (72.938 kg)  BMI 25.95 kg/m2  SpO2 97% Physical Exam  Constitutional: She is oriented to person, place, and time. She appears well-developed and well-nourished. No distress.  HENT: Head: Normocephalic and atraumatic. Ears; B TMs ok, no erythema or effusion; Nose: Nose normal.  Mouth/Throat: Oropharynx is clear and moist. No oropharyngeal exudate or mucosal lesions.  Eyes: Conjunctivae and EOM are normal. Pupils are equal, round, and reactive to light. No scleral icterus.  Neck: Normal range of motion. Neck supple. No JVD present. No thyromegaly present.  Skin: 6mm pustule at left lower corner of mouth (lip spared), no vesicles evident - No rash noted. no surrounding erythema.  Psychiatric: She has a normal mood and affect. Her behavior is normal. Judgment and thought content normal.         Assessment & Plan:  ?Fever blister, no hx same - now pustule infected -  s/p "I&D" with 25g syringe and expression of thick yellow green purulent material in office today (sterile technique used throughout) tx with doxy and topical bactracin - erx done, hold antiviral as no vesicles present To keep warm  compress on affected skin 3x/day and prn - Pt will call if unimproved, sooner if worse

## 2011-10-07 ENCOUNTER — Encounter: Payer: Medicare Other | Admitting: Internal Medicine

## 2011-10-16 ENCOUNTER — Encounter: Payer: Self-pay | Admitting: Internal Medicine

## 2011-10-16 ENCOUNTER — Telehealth: Payer: Self-pay | Admitting: *Deleted

## 2011-10-16 ENCOUNTER — Ambulatory Visit (INDEPENDENT_AMBULATORY_CARE_PROVIDER_SITE_OTHER): Payer: Medicare Other | Admitting: Internal Medicine

## 2011-10-16 VITALS — BP 130/82 | HR 85 | Temp 98.9°F

## 2011-10-16 DIAGNOSIS — J069 Acute upper respiratory infection, unspecified: Secondary | ICD-10-CM

## 2011-10-16 DIAGNOSIS — F17200 Nicotine dependence, unspecified, uncomplicated: Secondary | ICD-10-CM

## 2011-10-16 DIAGNOSIS — J0100 Acute maxillary sinusitis, unspecified: Secondary | ICD-10-CM

## 2011-10-16 DIAGNOSIS — Z720 Tobacco use: Secondary | ICD-10-CM

## 2011-10-16 MED ORDER — AMOXICILLIN-POT CLAVULANATE 875-125 MG PO TABS: 1.0000 | ORAL_TABLET | Freq: Two times a day (BID) | ORAL | Status: AC

## 2011-10-16 MED ORDER — HYDROCODONE-HOMATROPINE 5-1.5 MG/5ML PO SYRP: 5.0000 mL | ORAL_SOLUTION | Freq: Four times a day (QID) | ORAL | Status: AC | PRN

## 2011-10-16 NOTE — Progress Notes (Signed)
  Subjective:    HPI  complains of cold symptoms  Onset >1 week ago, wax/wane symptoms  associated with rhinorrhea, sneezing, sore throat, mild headache and low grade fever Now myalgias, sinus pressure and mild-mod chest congestion + thick green nasal discharge/PND Not using OTC meds Precipitated by sick contacts  Past Medical History  Diagnosis Date  . DEPRESSION   . HYPERLIPIDEMIA   . UTI'S, CHRONIC   . DISC DISEASE, CERVICAL   . NEPHROLITHIASIS, HX OF     Review of Systems Constitutional: No fever or night sweats, no unexpected weight change Pulmonary: No pleurisy or hemoptysis Cardiovascular: No chest pain or palpitations     Objective:   Physical Exam BP 130/82  Pulse 85  Temp(Src) 98.9 F (37.2 C) (Oral)  SpO2 98% GEN: mildly ill appearing and audible head/chest congestion, hoarse HENT: NCAT, mild sinus tenderness bilaterally, nares with thick discharge, oropharynx mild erythema, no exudate Eyes: Vision grossly intact, no conjunctivitis Lungs: Clear to auscultation with few rhonchi, no wheeze, no increased work of breathing Cardiovascular: Regular rate and rhythm, no bilateral edema      Assessment & Plan:  Viral URI >> maxillary sinusitis  Cough, postnasal drip related to above Tobacco abuse   Empiric antibiotics prescribed due to symptom duration greater than 7 days Prescription cough suppression - new prescriptions done Symptomatic care with Tylenol or Advil, hydration and rest -  salt gargle advised as needed Encouraged cessation

## 2011-10-16 NOTE — Patient Instructions (Signed)
It was good to see you today. Augmentin antibiotics and prescription cough syrup - Your prescription(s) have been submitted to your pharmacy. Please take as directed and contact our office if you believe you are having problem(s) with the medication(s). Alternate between ibuprofen and tylenol for aches, pain and fever symptoms as discussed Continue to think about giving up those cigarettes! Dr. Debby Bud can help with medications when you're ready to quit!

## 2011-10-16 NOTE — Telephone Encounter (Signed)
Pt c/o Influenza symptoms & sinusitis w/yellow mucus and Cough w/respiratory pain. OV scheduled w/ Dr Felicity Coyer at 3:15pm 01.11.13

## 2011-10-19 ENCOUNTER — Other Ambulatory Visit (INDEPENDENT_AMBULATORY_CARE_PROVIDER_SITE_OTHER): Payer: Medicare Other

## 2011-10-19 ENCOUNTER — Encounter: Payer: Self-pay | Admitting: Internal Medicine

## 2011-10-19 ENCOUNTER — Other Ambulatory Visit: Payer: Self-pay | Admitting: Internal Medicine

## 2011-10-19 ENCOUNTER — Ambulatory Visit (INDEPENDENT_AMBULATORY_CARE_PROVIDER_SITE_OTHER): Payer: Medicare Other | Admitting: Internal Medicine

## 2011-10-19 VITALS — BP 136/88 | HR 77 | Temp 98.1°F | Wt 157.0 lb

## 2011-10-19 DIAGNOSIS — K439 Ventral hernia without obstruction or gangrene: Secondary | ICD-10-CM

## 2011-10-19 DIAGNOSIS — M5030 Other cervical disc degeneration, unspecified cervical region: Secondary | ICD-10-CM

## 2011-10-19 DIAGNOSIS — F17200 Nicotine dependence, unspecified, uncomplicated: Secondary | ICD-10-CM

## 2011-10-19 DIAGNOSIS — Z0000 Encounter for general adult medical examination without abnormal findings: Secondary | ICD-10-CM

## 2011-10-19 DIAGNOSIS — F329 Major depressive disorder, single episode, unspecified: Secondary | ICD-10-CM

## 2011-10-19 DIAGNOSIS — E785 Hyperlipidemia, unspecified: Secondary | ICD-10-CM

## 2011-10-19 DIAGNOSIS — Z136 Encounter for screening for cardiovascular disorders: Secondary | ICD-10-CM

## 2011-10-19 DIAGNOSIS — Z720 Tobacco use: Secondary | ICD-10-CM | POA: Insufficient documentation

## 2011-10-19 DIAGNOSIS — N390 Urinary tract infection, site not specified: Secondary | ICD-10-CM

## 2011-10-19 LAB — COMPREHENSIVE METABOLIC PANEL
ALT: 23 U/L (ref 0–35)
AST: 19 U/L (ref 0–37)
BUN: 12 mg/dL (ref 6–23)
CO2: 27 mEq/L (ref 19–32)
Calcium: 9.5 mg/dL (ref 8.4–10.5)
Chloride: 97 mEq/L (ref 96–112)
Creatinine, Ser: 0.7 mg/dL (ref 0.4–1.2)
GFR: 115.41 mL/min (ref >60.00)
Total Bilirubin: 0.4 mg/dL (ref 0.3–1.2)

## 2011-10-19 LAB — LIPID PANEL
Cholesterol: 227 mg/dL — ABNORMAL HIGH (ref 0–200)
HDL: 51.9 mg/dL (ref >39.00)
Total CHOL/HDL Ratio: 4
Triglycerides: 105 mg/dL (ref 0.0–149.0)

## 2011-10-19 LAB — TSH: TSH: 0.75 u[IU]/mL (ref 0.35–5.50)

## 2011-10-19 MED ORDER — BUPROPION HCL ER (XL) 150 MG PO TB24: 150.0000 mg | ORAL_TABLET | Freq: Every day | ORAL | Status: DC

## 2011-10-19 NOTE — Patient Instructions (Addendum)
Good exam except there does seem to be a hernia at the lower abdomen that is very tender. Plan - refer to [facility] for consultation and probable repair.  Blood pressure is up just a little. NO need for medication. Please monitor your BP at home or the drugstore.  Health maintenance - you need to have a mammogram. You need to have colonoscopy, but will wait until hernia is resolved.  Smoking cessation: study your habit by keeping a diary of cigarettes writing down when you smoke and what you are doing. Rate every cigrette as 3 - didn't remember lighting it; 2 - lite it, want it; 1 - get between me and this cigarette and someone will die! Develop an altenative activity/strategy for the #1 rated cigarettes/ Set a quit date. Clear out all the smoking paraphenalia and quit on schedule. Start a necotine patch 21 mg 2 days before you quit. Start Wellbutrin xl 150 mg 1 week  Before your quit date.    Smoking Cessation This document explains the best ways for you to quit smoking and new treatments to help. It lists new medicines that can double or triple your chances of quitting and quitting for good. It also considers ways to avoid relapses and concerns you may have about quitting, including weight gain. NICOTINE: A POWERFUL ADDICTION If you have tried to quit smoking, you know how hard it can be. It is hard because nicotine is a very addictive drug. For some people, it can be as addictive as heroin or cocaine. Usually, people make 2 or 3 tries, or more, before finally being able to quit. Each time you try to quit, you can learn about what helps and what hurts. Quitting takes hard work and a lot of effort, but you can quit smoking. QUITTING SMOKING IS ONE OF THE MOST IMPORTANT THINGS YOU WILL EVER DO.  You will live longer, feel better, and live better.     The impact on your body of quitting smoking is felt almost immediately:     Within 20 minutes, blood pressure decreases. Pulse returns  to its normal level.     After 8 hours, carbon monoxide levels in the blood return to normal. Oxygen level increases.     After 24 hours, chance of heart attack starts to decrease. Breath, hair, and body stop smelling like smoke.     After 48 hours, damaged nerve endings begin to recover. Sense of taste and smell improve.     After 72 hours, the body is virtually free of nicotine. Bronchial tubes relax and breathing becomes easier.     After 2 to 12 weeks, lungs can hold more air. Exercise becomes easier and circulation improves.     Quitting will reduce your risk of having a heart attack, stroke, cancer, or lung disease:     After 1 year, the risk of coronary heart disease is cut in half.     After 5 years, the risk of stroke falls to the same as a nonsmoker.     After 10 years, the risk of lung cancer is cut in half and the risk of other cancers decreases significantly.     After 15 years, the risk of coronary heart disease drops, usually to the level of a nonsmoker.     If you are pregnant, quitting smoking will improve your chances of having a healthy baby.     The people you live with, especially your children, will be healthier.  You will have extra money to spend on things other than cigarettes.  FIVE KEYS TO QUITTING Studies have shown that these 5 steps will help you quit smoking and quit for good. You have the best chances of quitting if you use them together: 1. Get ready.    2. Get support and encouragement.    3. Learn new skills and behaviors.    4. Get medicine to reduce your nicotine addiction and use it correctly.    5. Be prepared for relapse or difficult situations. Be determined to continue trying to quit, even if you do not succeed at first.  1. GET READY  Set a quit date.     Change your environment.     Get rid of ALL cigarettes, ashtrays, matches, and lighters in your home, car, and place of work.     Do not let people smoke in your home.      Review your past attempts to quit. Think about what worked and what did not.     Once you quit, do not smoke. NOT EVEN A PUFF!  2. GET SUPPORT AND ENCOURAGEMENT Studies have shown that you have a better chance of being successful if you have help. You can get support in many ways.  Tell your family, friends, and coworkers that you are going to quit and need their support. Ask them not to smoke around you.     Talk to your caregivers (doctor, dentist, nurse, pharmacist, psychologist, and/or smoking counselor).     Get individual, group, or telephone counseling and support. The more counseling you have, the better your chances are of quitting. Programs are available at [employer] and health centers. Call your local health department for information about programs in your area.     Spiritual beliefs and practices may help some smokers quit.     Quit meters are Photographer that keep track of quit statistics, such as amount of "quit-time," cigarettes not smoked, and money saved.     Many smokers find one or more of the many self-help books available useful in helping them quit and stay off tobacco.  3. LEARN NEW SKILLS AND BEHAVIORS  Try to distract yourself from urges to smoke. Talk to someone, go for a walk, or occupy your time with a task.     When you first try to quit, change your routine. Take a different route to work. Drink tea instead of coffee. Eat breakfast in a different place.     Do something to reduce your stress. Take a hot bath, exercise, or read a book.     Plan something enjoyable to do every day. Reward yourself for not smoking.     Explore interactive web-based programs that specialize in helping you quit.  4. GET MEDICINE AND USE IT CORRECTLY Medicines can help you stop smoking and decrease the urge to smoke. Combining medicine with the above behavioral methods and support can quadruple your chances of successfully quitting  smoking. The U.S. Food and Drug Administration (FDA) has approved 7 medicines to help you quit smoking. These medicines fall into 3 categories.  Nicotine replacement therapy (delivers nicotine to your body without the negative effects and risks of smoking):     Nicotine gum: Available over-the-counter.     Nicotine lozenges: Available over-the-counter.     Nicotine inhaler: Available by prescription.     Nicotine nasal spray: Available by prescription.     Nicotine skin patches (transdermal): Available  by prescription and over-the-counter.     Antidepressant medicine (helps people abstain from smoking, but how this works is unknown):     Bupropion sustained-release (SR) tablets: Available by prescription.     Nicotinic receptor partial agonist (simulates the effect of nicotine in your brain):     Varenicline tartrate tablets: Available by prescription.     Ask your caregiver for advice about which medicines to use and how to use them. Carefully read the information on the package.     Everyone who is trying to quit may benefit from using a medicine. If you are pregnant or trying to become pregnant, nursing an infant, you are under age 50, or you smoke fewer than 10 cigarettes per day, talk to your caregiver before taking any nicotine replacement medicines.     You should stop using a nicotine replacement product and call your caregiver if you experience nausea, dizziness, weakness, vomiting, fast or irregular heartbeat, mouth problems with the lozenge or gum, or redness or swelling of the skin around the patch that does not go away.     Do not use any other product containing nicotine while using a nicotine replacement product.     Talk to your caregiver before using these products if you have diabetes, heart disease, asthma, stomach ulcers, you had a recent heart attack, you have high blood pressure that is not controlled with medicine, a history of irregular heartbeat, or you have  been prescribed medicine to help you quit smoking.  5. BE PREPARED FOR RELAPSE OR DIFFICULT SITUATIONS  Most relapses occur within the first 3 months after quitting. Do not be discouraged if you start smoking again. Remember, most people try several times before they finally quit.     You may have symptoms of withdrawal because your body is used to nicotine. You may crave cigarettes, be irritable, feel very hungry, cough often, get headaches, or have difficulty concentrating.     The withdrawal symptoms are only temporary. They are strongest when you first quit, but they will go away within 10 to 14 days.  Here are some difficult situations to watch for:  Alcohol. Avoid drinking alcohol. Drinking lowers your chances of successfully quitting.     Caffeine. Try to reduce the amount of caffeine you consume. It also lowers your chances of successfully quitting.     Other smokers. Being around smoking can make you want to smoke. Avoid smokers.     Weight gain. Many smokers will gain weight when they quit, usually less than 10 pounds. Eat a healthy diet and stay active. Do not let weight gain distract you from your main goal, quitting smoking. Some medicines that help you quit smoking may also help delay weight gain. You can always lose the weight gained after you quit.     Bad mood or depression. There are a lot of ways to improve your mood other than smoking.  If you are having problems with any of these situations, talk to your caregiver. SPECIAL SITUATIONS AND CONDITIONS Studies suggest that everyone can quit smoking. Your situation or condition can give you a special reason to quit.  Pregnant women/new mothers: By quitting, you protect your baby's health and your own.     Hospitalized patients: By quitting, you reduce health problems and help healing.     Heart attack patients: By quitting, you reduce your risk of a second heart attack.     Lung, head, and neck cancer patients: By  quitting, you  reduce your chance of a second cancer.     Parents of children and adolescents: By quitting, you protect your children from illnesses caused by secondhand smoke.  QUESTIONS TO THINK ABOUT Think about the following questions before you try to stop smoking. You may want to talk about your answers with your caregiver.  Why do you want to quit?     If you tried to quit in the past, what helped and what did not?     What will be the most difficult situations for you after you quit? How will you plan to handle them?     Who can help you through the tough times? Your family? Friends? Caregiver?     What pleasures do you get from smoking? What ways can you still get pleasure if you quit?  Here are some questions to ask your caregiver:  How can you help me to be successful at quitting?     What medicine do you think would be best for me and how should I take it?     What should I do if I need more help?     What is smoking withdrawal like? How can I get information on withdrawal?  Quitting takes hard work and a lot of effort, but you can quit smoking. FOR MORE INFORMATION   Smokefree.gov (http://www.davis-sullivan.com/) provides free, accurate, evidence-based information and professional assistance to help support the immediate and long-term needs of people trying to quit smoking. Document Released: 09/15/2001 Document Revised: 06/03/2011 Document Reviewed: 07/08/2009 [facility] Patient Information 2012 [place], [place].Smoking Cessation, Tips for Success YOU CAN QUIT SMOKING If you are ready to quit smoking, congratulations! You have chosen to help yourself be healthier. Cigarettes bring nicotine, tar, carbon monoxide, and other irritants into your body. Your lungs, heart, and blood vessels will be able to work better without these poisons. There are many different ways to quit smoking. Nicotine gum, nicotine patches, a nicotine inhaler, or nicotine nasal spray can help with physical  craving. Hypnosis, support groups, and medicines help break the habit of smoking. Here are some tips to help you quit for good.  Throw away all cigarettes.     Clean and remove all ashtrays from your home, work, and car.     On a card, write down your reasons for quitting. Carry the card with you and read it when you get the urge to smoke.     Cleanse your body of nicotine. Drink enough water and fluids to keep your urine clear or pale yellow. Do this after quitting to flush the nicotine from your body.     Learn to predict your moods. Do not let a bad situation be your excuse to have a cigarette. Some situations in your life might tempt you into wanting a cigarette.     Never have "just one" cigarette. It leads to wanting another and another. Remind yourself of your decision to quit.     Change habits associated with smoking. If you smoked while driving or when feeling stressed, try other activities to replace smoking. Stand up when drinking your coffee. Brush your teeth after eating. Sit in a different chair when you read the paper. Avoid alcohol while trying to quit, and try to drink fewer caffeinated beverages. Alcohol and caffeine may urge you to smoke.     Avoid foods and drinks that can trigger a desire to smoke, such as sugary or spicy foods and alcohol.     Ask people who smoke  not to smoke around you.     Have something planned to do right after eating or having a cup of coffee. Take a walk or exercise to perk you up. This will help to keep you from overeating.     Try a relaxation exercise to calm you down and decrease your stress. Remember, you may be tense and nervous for the first 2 weeks after you quit, but this will pass.     Find new activities to keep your hands busy. Play with a pen, coin, or rubber band. Doodle or draw things on paper.     Brush your teeth right after eating. This will help cut down on the craving for the taste of tobacco after meals. You can try  mouthwash, too.     Use oral substitutes, such as lemon drops, carrots, a cinnamon stick, or chewing gum, in place of cigarettes. Keep them handy so they are available when you have the urge to smoke.     When you have the urge to smoke, try deep breathing.     Designate your home as a nonsmoking area.     If you are a heavy smoker, ask your caregiver about a prescription for nicotine chewing gum. It can ease your withdrawal from nicotine.     Reward yourself. Set aside the cigarette money you save and buy yourself something nice.     Look for support from others. Join a support group or smoking cessation program. Ask someone at home or at work to help you with your plan to quit smoking.     Always ask yourself, "Do I need this cigarette or is this just a reflex?" Tell yourself, "Today, I choose not to smoke," or "I do not want to smoke." You are reminding yourself of your decision to quit, even if you do smoke a cigarette.  HOW WILL I FEEL WHEN I QUIT SMOKING?  The benefits of not smoking start within days of quitting.     You may have symptoms of withdrawal because your body is used to nicotine (the addictive substance in cigarettes). You may crave cigarettes, be irritable, feel very hungry, cough often, get headaches, or have difficulty concentrating.     The withdrawal symptoms are only temporary. They are strongest when you first quit but will go away within 10 to 14 days.     When withdrawal symptoms occur, stay in control. Think about your reasons for quitting. Remind yourself that these are signs that your body is healing and getting used to being without cigarettes.     Remember that withdrawal symptoms are easier to treat than the major diseases that smoking can cause.     Even after the withdrawal is over, expect periodic urges to smoke. However, these cravings are generally short-lived and will go away whether you smoke or not. Do not smoke!     If you relapse and smoke  again, do not lose hope. Most smokers quit 3 times before they are successful.     If you relapse, do not give up! Plan ahead and think about what you will do the next time you get the urge to smoke.  LIFE AS A NONSMOKER: MAKE IT FOR A MONTH, MAKE IT FOR LIFE Day 1: Hang this page where you will see it every day. Day 2: Get rid of all ashtrays, matches, and lighters. Day 3: Drink water. Breathe deeply between sips. Day 4: Avoid places with smoke-filled air, such as bars,  clubs, or the smoking section of restaurants. Day 5: Keep track of how much money you save by not smoking. Day 6: Avoid boredom. Keep a good book with you or go to the movies. Day 7: Reward yourself! One week without smoking! Day 8: Make a dental appointment to get your teeth cleaned. Day 9: Decide how you will turn down a cigarette before it is offered to you. Day 10: Review your reasons for quitting. Day 11: Distract yourself. Stay active to keep your mind off smoking and to relieve tension. Take a walk, exercise, read a book, do a crossword puzzle, or try a new hobby. Day 12: Exercise. Get off the bus before your stop or use stairs instead of escalators. Day 13: Call on friends for support and encouragement. Day 14: Reward yourself! Two weeks without smoking! Day 15: Practice deep breathing exercises. Day 16: Bet a friend that you can stay a nonsmoker. Day 17: Ask to sit in nonsmoking sections of restaurants. Day 18: Hang up "No Smoking" signs. Day 19: Think of yourself as a nonsmoker. Day 20: Each morning, tell yourself you will not smoke. Day 21: Reward yourself! Three weeks without smoking! Day 22: Think of smoking in negative ways. Remember how it stains your teeth, gives you bad breath, and leaves you short of breath. Day 23: Eat a nutritious breakfast. Day 24:Do not relive your days as a smoker. Day 25: Hold a pencil in your hand when talking on the telephone. Day 26: Tell all your friends you do not smoke. Day  27: Think about how much better food tastes. Day 28: Remember, one cigarette is one too many. Day 29: Take up a hobby that will keep your hands busy. Day 30: Congratulations! One month without smoking! Give yourself a big reward. Your caregiver can direct you to community resources or hospitals for support, which may include:  Group support.     Education.    Hypnosis.    Subliminal therapy.  Document Released: 06/19/2004 Document Revised: 06/03/2011 Document Reviewed: 07/08/2009 [facility] Patient Information 2012 [place], [place].

## 2011-10-19 NOTE — Progress Notes (Signed)
Subjective:    Patient ID: Lauren Orozco, female    DOB: 1956/02/25, 56 y.o.   MRN: [id number]  HPI Mrs. Lazarz presents for a general physical exam. She is s/p hysterectomy and her last PAP about 2 years ago was OK. Her chief complaint is a hernia in the suprapubic area is now painful, worse at night and feels like a flutter. She is having continued pain in the neck and upper back. She is not taking any medication for this. She does do ROM exercise.  Past Medical History  Diagnosis Date  . DEPRESSION   . HYPERLIPIDEMIA   . UTI'S, CHRONIC   . DISC DISEASE, CERVICAL   . NEPHROLITHIASIS, HX OF    Past Surgical History  Procedure Date  . Appendectomy 1981  . Abdominal hysterectomy 2001  . Left oophorectomy 2001   Family History  Problem Relation Age of Onset  . Hypertension Mother   . Diabetes Mother   . Hyperlipidemia Mother   . Heart attack Father     MI  . Hyperlipidemia Father   . Hypertension Father   . Diabetes Father   . Cancer Neg Hx     Negative for Breast or Colon Cancer   History   Social History  . Marital Status: Single    Spouse Name: N/A    Number of Children: 1  . Years of Education: HSG   Occupational History  . Disabled    Social History Main Topics  . Smoking status: Current Some Day Smoker -- 0.5 packs/day for 30 years    Types: Cigarettes  . Smokeless tobacco: Never Used  . Alcohol Use: Yes  . Drug Use: No  . Sexually Active: Not Currently   Other Topics Concern  . 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 Work-disability due to neck and arthritis problems. Lives-alone. No history of physical or sexual abuse       Review of Systems Constitutional:  Negative for fever, activity change and unexpected weight change. Has had chills and felt cold. HEENT:  Negative for hearing loss, ear pain, congestion, neck stiffness and postnasal drip. Negative  for sore throat or swallowing problems. Still with frontal sinus pain. Negative for dental complaints.   Eyes: Negative for vision loss or change in visual acuity.  Respiratory: Negative for chest tightness and wheezing. Negative for DOE.   Cardiovascular: Negative for chest pain or palpitations. No decreased exercise tolerance Gastrointestinal: No change in bowel habit. No bloating or gas. No reflux or indigestion Genitourinary: Negative for urgency, frequency, flank pain and difficulty urinating.  Musculoskeletal: Negative for myalgias, back pain, arthralgias and gait problem.  Neurological: Negative for dizziness, tremors, weakness and headaches.  Hematological: Negative for adenopathy.  Psychiatric/Behavioral: Negative for behavioral problems and dysphoric mood.       Objective:   Physical Exam Filed Vitals:   10/19/11 1144  BP: 136/88  Pulse: 77  Temp: 98.1 F (36.7 C)    Gen'l: well nourished, well developed AA woman in no distress HEENT - /AT, EACs/TMs normal, oropharynx - missing many teeth, no frank carious teeth, no gingivitis, no buccal or palatal lesions, posterior pharynx clear, mucous membranes moist. C&S clear, PERRLA, fundi - normal Neck - supple, no thyromegaly Nodes- negative submental, cervical, supraclavicular regions Chest - no deformity, no CVAT Lungs - clear without rales, wheezes. No increased work of breathing Breast - nipples w/o discharge, skin  normal, no fixed mass or lesion. No axillary adenopathy. Cardiovascular - regular rate and rhythm, quiet precordium, no murmurs, rubs or gallops, 2+ radial, DP and PT pulses Abdomen - BS+ x 4, no HSM, no guarding or rebound or tenderness. Umbilical hernia small and easily reducible. Ventral hernia/bulge 3-4 cm below umblicus, hard, very tender, irreducible. Pelvic - deferred s/p hysterectomy Rectal - deferred Extremities - no clubbing, cyanosis, edema or deformity.  Neuro - A&O x 3, CN II-XII normal, motor  strength normal and equal, DTRs 2+ and symmetrical biceps, radial, and patellar tendons. Cerebellar - no tremor, no rigidity, fluid movement and normal gait. Derm - Head, neck, back, abdomen and extremities without suspicious lesions          Assessment & Plan:  Ventral hernia - on exam there is a firm, tender bulge 3-4 cm below the umbilicus in midline suspicious for a hernia.   Plan - refer to [facility] for evaluation

## 2011-10-20 ENCOUNTER — Ambulatory Visit (INDEPENDENT_AMBULATORY_CARE_PROVIDER_SITE_OTHER)
Admission: RE | Admit: 2011-10-20 | Discharge: 2011-10-20 | Disposition: A | Discharge status: Home / Self Care | Payer: Medicare Other | Source: Ambulatory Visit | Attending: Internal Medicine | Admitting: Internal Medicine

## 2011-10-20 ENCOUNTER — Telehealth: Payer: Self-pay | Admitting: *Deleted

## 2011-10-20 DIAGNOSIS — Z720 Tobacco use: Secondary | ICD-10-CM

## 2011-10-20 DIAGNOSIS — Z0000 Encounter for general adult medical examination without abnormal findings: Secondary | ICD-10-CM | POA: Insufficient documentation

## 2011-10-20 DIAGNOSIS — F17200 Nicotine dependence, unspecified, uncomplicated: Secondary | ICD-10-CM

## 2011-10-20 DIAGNOSIS — Z0001 Encounter for general adult medical examination with abnormal findings: Secondary | ICD-10-CM | POA: Insufficient documentation

## 2011-10-20 NOTE — Telephone Encounter (Signed)
Pt. Was seen yesterday and was told she needed a chest x-ray but they were unable to find the order. OK to come back today to have x-ray?

## 2011-10-20 NOTE — Assessment & Plan Note (Signed)
Continues to have some neck pain with mild reduction in ROM

## 2011-10-20 NOTE — Assessment & Plan Note (Signed)
Continues on Mevacor.   Plan - lipid panel with recommendations to follow.

## 2011-10-20 NOTE — Telephone Encounter (Signed)
Original order entered Jan 14th at 1300 hrs. May return for CXR today. (could have called up for order clarifications yesterday).

## 2011-10-20 NOTE — Assessment & Plan Note (Signed)
Discussed smoking cessation and smoking cessation plan - see AVS  Plan - cessation using nicoderm patch 21 mg to start; wellbutrin xl 150 qAM to start 1 week prior to quit date.

## 2011-10-20 NOTE — Assessment & Plan Note (Addendum)
Interval medical history is unremarkable for any new medical problems, surgery or injury. Physical exam significant for hernia otherwise normal. Labs are pending. Gyn- s/p hysterectomy. Due for colorectal cancer screening - will wait to schedule until after hernia repair. Immunizations - no record of tetanus found. 12 lead EKG - no evidence of ischemia or injury.  In summary- a very nice woman with a ventral hernia that will need to be evaluated by general surgery. She is committed to smoking cessation and support will be provided.She will need routine immunizations.

## 2011-10-20 NOTE — Assessment & Plan Note (Addendum)
Seems stable at this time.  With prior use of wellbutrin that was well tolerated will use for smoking cessation.

## 2011-10-20 NOTE — Telephone Encounter (Signed)
Phoned pt & she will be coming in today for chest x-ray.

## 2011-10-23 ENCOUNTER — Ambulatory Visit (INDEPENDENT_AMBULATORY_CARE_PROVIDER_SITE_OTHER): Payer: Self-pay | Admitting: Surgery

## 2011-11-11 ENCOUNTER — Encounter (INDEPENDENT_AMBULATORY_CARE_PROVIDER_SITE_OTHER): Payer: Self-pay | Admitting: Surgery

## 2011-11-12 ENCOUNTER — Encounter (INDEPENDENT_AMBULATORY_CARE_PROVIDER_SITE_OTHER): Payer: Self-pay | Admitting: Surgery

## 2011-11-12 ENCOUNTER — Ambulatory Visit (INDEPENDENT_AMBULATORY_CARE_PROVIDER_SITE_OTHER): Payer: Medicare Other | Admitting: Surgery

## 2011-11-12 DIAGNOSIS — K439 Ventral hernia without obstruction or gangrene: Secondary | ICD-10-CM

## 2011-11-12 DIAGNOSIS — R109 Unspecified abdominal pain: Secondary | ICD-10-CM

## 2011-11-12 DIAGNOSIS — L7682 Other postprocedural complications of skin and subcutaneous tissue: Secondary | ICD-10-CM | POA: Insufficient documentation

## 2011-11-12 NOTE — Progress Notes (Signed)
Re:   Lauren Orozco DOB:   06/27/1955 MRN:   [id number]  ASSESSMENT AND PLAN: 1.  Nodular area in right aspect of pfannenstiel incision, but I do not feel a ventral hernia  I reviewed with the patient hernias and hernia surgery.  With her symptoms, one option would be to do a CT scan to check her abdominal wall.  But she is thin enough, I ought to be able to feel something.  So for now, I will recheck her in 6 months.  If her symptoms worsen, she will see [place] earlier.  She is not anxious to have surgery.  In the mean time, I encourage to continue her drive to quit smoking.  And if she is going to get a colonoscopy, go ahead and get one.  2.  Small umbilcal hernia.  I would not recommend surgery on this at this time.  3.  Smokes.  Before any contemplated surgery, she needs to stop smoking.  She says she is down to 4-6 cigs per day. 4.  Nephrolithiasis. 5.  Disability secondary to neck pain.  Since 2009. 6.  Nausea and problems with BM, chronic.  Chief Complaint  Patient presents with  . Hernia    new pt- eval ventral hernia   REFERRING PHYSICIAN: Illene Regulus, MD, MD  HISTORY OF PRESENT ILLNESS: Lauren Orozco is a 56 y.o. (DOB: [birth year])  AA female whose primary care physician is Illene Regulus, MD, MD and comes to me today for possible ventral hernia.  The patient has had stomach pain, trouble with her bowel movements, and nausea that has gone on for years. She has tried heating pads on her abdomen with some symptomatic relief. She had a surgery for an ovarian cyst (side?) and appendectomy in 1981 by Dr. Laureen Ochs. She had a hysterectomy for fibroids by Dr. Amedeo Kinsman in 2001.  She denies any history of stomach ulcers, liver disease, gallbladder disease, or pancreas disease. She's never had a colonoscopy, but plans to get one after any type of surgery.  She saw Dr. Illene Regulus on 20 October 2011. Dr. Debby Bud was concerned about a hard mass he could feel in her  lower abdomen. The patient had no prior history of hernia disease or hernia surgery.    Past Medical History  Diagnosis Date  . DEPRESSION   . HYPERLIPIDEMIA   . UTI'S, CHRONIC   . DISC DISEASE, CERVICAL   . NEPHROLITHIASIS, HX OF       Past Surgical History  Procedure Date  . Appendectomy 1981  . Abdominal hysterectomy 2001  . Left oophorectomy 2001  . Neck surgery 2009    fusion of C1, C2, C3  . Kidney stone surgery 2007  . Myomectomy 1981      Current Outpatient Prescriptions  Medication Sig Dispense Refill  . buPROPion (WELLBUTRIN XL) 150 MG 24 hr tablet Take 1 tablet (150 mg total) by mouth daily.  30 tablet  5  . lovastatin (MEVACOR) 40 MG tablet Take 1 tablet (40 mg total) by mouth daily.  90 tablet  1      Allergies  Allergen Reactions  . Ciprofloxacin     All floxacin drugs  . Ofloxacin     REVIEW OF SYSTEMS: Skin:  No history of rash.  No history of abnormal moles. Infection:  No history of hepatitis or HIV.  No history of MRSA. Neurologic:  No history of stroke.  No history of seizure.  No history of headaches. Cardiac:  No history of hypertension. No history of heart disease.  No history of prior cardiac catheterization.  No history of seeing a cardiologist. Pulmonary:  Smokes.  Endocrine:  No diabetes. No thyroid disease. Gastrointestinal:  See HPI. Urologic:  No history of kidney stones.  No history of bladder infections. Musculoskeletal:  Chronic back pain.  Cervical spine surgery in 2009 by Dr. Winferd Humphrey at [facility].  On disability since then. Hematologic:  No bleeding disorder.  No history of anemia.  Not anticoagulated. Psycho-social:  The patient is oriented.   On Wellbutrin for anxiety/qutting smoking.  SOCIAL and FAMILY HISTORY: Single.  Lives by self. Has one daughter in college.  PHYSICAL EXAM: BP 134/88  Pulse 92  Temp(Src) 97.8 F (36.6 C) (Temporal)  Resp 18  Ht 5\' 6"  (1.676 m)  Wt 159 lb (72.122 kg)  BMI 25.66 kg/m2  General: AA F  who is alert and generally healthy appearing.  HEENT: Normal. Pupils equal. Good dentition. Neck: Supple. No mass.  No thyroid mass.  Carotid pulse okay with no bruit. Lymph Nodes:  No supraclavicular or cervical nodes. Lungs: Clear to auscultation and symmetric breath sounds. Heart:  RRR. No murmur or rub.  Abdomen: Soft. Small umbilical hernia, which is reducible.  Though she has some nodularity to her lower abdomen, I can not feel a ventral hernia, either supine or standing.  Particular attention spent on her pfannenstiel incision. Rectal: Not done. Extremities:  Good strength and ROM  in upper and lower extremities. Neurologic:  Grossly intact to motor and sensory function. Psychiatric: Has normal mood and affect. Behavior is normal.   DATA REVIEWED: Notes from Dr. Debby Bud.  Ovidio Kin, MD,  [facility] [address]  [address]   [place], [place]    [address] Phone:  [phone] FAX:  [phone]

## 2011-12-07 ENCOUNTER — Other Ambulatory Visit (INDEPENDENT_AMBULATORY_CARE_PROVIDER_SITE_OTHER): Payer: Medicare Other

## 2011-12-07 ENCOUNTER — Encounter: Payer: Self-pay | Admitting: Internal Medicine

## 2011-12-07 ENCOUNTER — Ambulatory Visit (INDEPENDENT_AMBULATORY_CARE_PROVIDER_SITE_OTHER): Payer: Medicare Other | Admitting: Internal Medicine

## 2011-12-07 VITALS — BP 138/86 | HR 89 | Temp 97.6°F | Resp 16 | Wt 157.8 lb

## 2011-12-07 DIAGNOSIS — M353 Polymyalgia rheumatica: Secondary | ICD-10-CM | POA: Insufficient documentation

## 2011-12-07 LAB — SEDIMENTATION RATE: Sed Rate: 16 mm/hr (ref 0–22)

## 2011-12-07 NOTE — Progress Notes (Signed)
  Subjective:    Patient ID: Lauren Orozco, female    DOB: 1955-11-26, 56 y.o.   MRN: [id number]  HPI Mr.s Berhe presents for progressive pain in the upper back, lower cervical spine with increasing stiffness and loss of ROM with rotation. She has reports progressive paresthesia in the fingers, no change in strength but she was weak to begin with. She has used otc ibuprofen, rubs all to no avail. She is having a hard time resting due to the pain. She is s/p cervical fusion C3-C7 in '09 at [facility] - no records are retrievably in the system.   PMH, FamHx and SocHx reviewed for any changes and relevance.    Review of Systems System review is negative for any constitutional, cardiac, pulmonary, GI or neuro symptoms or complaints other than as described in the HPI.     Objective:   Physical Exam Filed Vitals:   12/07/11 1315  BP: 138/86  Pulse: 89  Temp: 97.6 F (36.4 C)  Resp: 16   Weight: 157 lb 12 oz (71.555 kg)  Gen'l WNWD AA woman Cor - RRR Pulm - normal respirations MSK - full ROM neck with flexion and extension and rotation. Normal ROM UE. Normal strength UE proximally and distally, normal grip strength, normal DTRs biceps and radial tendons; normal sensation to light touch, pin-prick.        Assessment & Plan:

## 2011-12-07 NOTE — Patient Instructions (Signed)
Neck pain  - there is no evidence of new cervical disk disease. I suspect an inflammatory disease - Polymyalgia rheumatica. Plan - sed rate - if elevated will treat with prednisone.    Polymyalgia Rheumatica Polymyalgia rheumatica (also called PMR or polymyalgia) is a rheumatologic (arthritic) condition that causes pain and morning stiffness in your neck, shoulders, and hips. It is an inflammatory condition. In some people, inflammation of certain structures in the shoulder, hips, or other joints can be seen on special testing. It does not cause joint destruction, as occurs in other arthritic conditions. It usually occurs after 56 years of age, and is more common as you age. It can be confused with several other diseases, but it is usually easily treated. People with PMR often have, or can develop, a more severe rheumatologic condition called giant cell arteritis (also called CGA or temporal arteritis).   CAUSES   The exact cause of PMR is not known.    There are genetic factors involved.   Viruses have been suspected in the cause of PMR. This has not been proven.  SYMPTOMS    Aching, pain, and morning stiffness your neck, both shoulders, or both hips.   Symptoms usually start slowly and build gradually.   Morning stiffness usually lasts at least 30 minutes.   Swelling and tenderness in other joints of the arms, hands, legs, and feet may occur.   Swelling and inflammation in the wrists can cause nerve inflammation at the wrist (carpal tunnel syndrome).   You may also have low grade fever, fatigue, weakness, decreased appetite and weight loss.  DIAGNOSIS    Your caregiver may suspect that you have PMR based on your description of your symptoms and on your exam.   Your caregiver will examine you to be sure you do not have diseases that can be confused with PMR. These diseases include rheumatoid arthritis, fibromyalgia, or thyroid disease.   Your caregiver should check for signs of giant  cell arteritis. This can cause serious complications such as blindness.   Lab tests can help confirm that you have PMR and not other diseases, but are sometimes inconclusive.   X-rays cannot show PMR. However, it can identify other diseases like rheumatoid arthritis. Your caregiver may have you see a specialist in arthritis and inflammatory diseases (rheumatologist).  TREATMENT   The goal of treatment is relief of symptoms. Treatment does not shorten the course of the illness or prevent complications. With proper treatment, you usually feel better almost right away.    The initial treatment of PMR is usually cortisone (steroid) medication, such as prednisone or prednisolone. Your caregiver will help determine a starting dose, which is usually a low to moderate dose. The dose is gradually reduced every few weeks to months. Treatment usually lasts one to three years.   Other stronger medications are rarely needed. They will only be prescribed if your symptoms do not get better on cortisone medication alone, or if they recur as the dose is reduced.   Cortisone medication can have different side effects. With the doses of cortisone needed for PMR, the side effects are usually mild. Discuss this with your caregiver.   Your caregiver will evaluate you regularly during your treatment. They will do this in order to assess progress and to check for complications of the illness or treatment.   Physical therapy is sometimes useful. This is especially true if your joints are still stiff after other symptoms have improved.  HOME CARE INSTRUCTIONS  Follow your caregiver's instructions. Do not change your dose of cortisone medication on your own.   Keep your appointments for follow-up lab tests and caregiver visits. Your lab tests need to be monitored. You must get checked periodically for giant cell arteritis.   Follow your caregiver's guidance regarding physical activity (usually no restrictions are  needed) or physical therapy.   Your caregiver may have instructions to prevent or check for side effects from cortisone medication (including bone density testing or treatment). Follow their instructions carefully.  SEEK MEDICAL CARE IF:    You develop any side effects from treatment. Side effects can include:   Elevated blood pressure.   High blood sugar (or worsening of diabetes, if you are diabetic).   Difficulty fighting off infections.   Weight gain.   Weakness of the bones (osteoporosis).   Your aches, pains, morning stiffness, or other symptoms get worse with time. This is especially true after your dose of cortisone is reduced.   You develop new joint symptoms (pain, swelling, etc.)  SEEK IMMEDIATE MEDICAL CARE IF:    You develop a severe headache.   You start vomiting.   You have problems with your vision.   You have an oral temperature above 102 F (38.9 C), not controlled by medicine.  Document Released: 10/29/2004 Document Revised: 09/10/2011 Document Reviewed: 02/11/2009 [facility] Patient Information 2012 [place], [place].

## 2011-12-07 NOTE — Assessment & Plan Note (Addendum)
Pain and weakness in the shoulder girdle and to a lesser extent in the hip girdle. Exam is w/o radiculopathy making recurrent disk disease unlikely.  Plan - ESR           If ESR elevated will treat with steroids.   Addendum - ESR 1!!  Plan - for continued pain will need follow-up OV

## 2011-12-08 ENCOUNTER — Telehealth: Payer: Self-pay | Admitting: *Deleted

## 2011-12-08 NOTE — Telephone Encounter (Signed)
Message copied by Regis Bill on Tue Dec 08, 2011  5:08 PM ------      Message from: Jacques Navy      Created: Tue Dec 08, 2011  8:54 AM       Please call patient: Sed rate was normal - PMR not likely diagnosis. For continued pain will need follow-up office visit

## 2011-12-08 NOTE — Telephone Encounter (Signed)
LMOM to inform patient. 

## 2011-12-15 ENCOUNTER — Telehealth: Payer: Self-pay | Admitting: Internal Medicine

## 2011-12-15 NOTE — Telephone Encounter (Signed)
Received 14 pages. Sent to Dr. Debby Bud. SD

## 2012-01-04 ENCOUNTER — Telehealth: Payer: Self-pay

## 2012-01-04 MED ORDER — MELOXICAM 15 MG PO TABS: 15.0000 mg | ORAL_TABLET | Freq: Every day | ORAL | Status: DC

## 2012-01-04 NOTE — Telephone Encounter (Signed)
Per MD ok to send in meloxicam 15mg  qd # 30/5 rf. Patient notified

## 2012-01-04 NOTE — Telephone Encounter (Signed)
Patient called c/o continued bilateral hip, shoulder, and hand pain. Heating pad not work nor is OTC ibuprofen. She is requesting that MD send in something to help manage pain.

## 2012-06-16 ENCOUNTER — Telehealth (INDEPENDENT_AMBULATORY_CARE_PROVIDER_SITE_OTHER): Payer: Self-pay | Admitting: General Surgery

## 2012-06-16 ENCOUNTER — Ambulatory Visit (INDEPENDENT_AMBULATORY_CARE_PROVIDER_SITE_OTHER): Payer: Medicare Other | Admitting: Surgery

## 2012-06-16 NOTE — Telephone Encounter (Signed)
Called patient and advised of revised appointment for 07/20/12 at 10:00.

## 2012-06-21 ENCOUNTER — Telehealth: Payer: Self-pay | Admitting: Internal Medicine

## 2012-06-21 NOTE — Telephone Encounter (Signed)
Caller: [name]/Patient; Patient Name: Lauren Orozco; PCP: Illene Regulus (Adults only); Best Callback Phone Number: [phone]; Reason for call: Sore throat, headache, cough, congestion and fever 99. Onset 06/18/12. Body aches. Coughing up brownish mucous that now has some specks of  blood in it. All emergent symtpoms per Upper Respiratory Infection protocol ruled out. Home care advice given. Advised to be seen within 24 hours per guidelines. No appointments available with Dr. Debby Bud. Appointment scheduled with Dr. Everardo All for 06/22/12 at 0930AM.

## 2012-06-22 ENCOUNTER — Encounter: Payer: Self-pay | Admitting: Endocrinology

## 2012-06-22 ENCOUNTER — Ambulatory Visit (INDEPENDENT_AMBULATORY_CARE_PROVIDER_SITE_OTHER)
Admission: RE | Admit: 2012-06-22 | Discharge: 2012-06-22 | Disposition: A | Discharge status: Home / Self Care | Payer: Medicare Other | Source: Ambulatory Visit | Attending: Endocrinology | Admitting: Endocrinology

## 2012-06-22 ENCOUNTER — Ambulatory Visit (INDEPENDENT_AMBULATORY_CARE_PROVIDER_SITE_OTHER): Payer: Medicare Other | Admitting: Endocrinology

## 2012-06-22 VITALS — BP 142/88 | HR 73 | Temp 99.0°F

## 2012-06-22 DIAGNOSIS — J209 Acute bronchitis, unspecified: Secondary | ICD-10-CM

## 2012-06-22 MED ORDER — PROMETHAZINE-CODEINE 6.25-10 MG/5ML PO SYRP: 5.0000 mL | ORAL_SOLUTION | ORAL | Status: AC | PRN

## 2012-06-22 MED ORDER — CEFUROXIME AXETIL 250 MG PO TABS: 250.0000 mg | ORAL_TABLET | Freq: Two times a day (BID) | ORAL | Status: DC

## 2012-06-22 NOTE — Progress Notes (Signed)
  Subjective:    Patient ID: Lauren Orozco, female    DOB: [birth year], 56 y.o.   MRN: [id number]  HPI Pt states 4 days of moderate prod-quality cough in the chest, but no assoc wheezing Past Medical History  Diagnosis Date  . DEPRESSION   . HYPERLIPIDEMIA   . UTI'S, CHRONIC   . DISC DISEASE, CERVICAL   . NEPHROLITHIASIS, HX OF     Past Surgical History  Procedure Date  . Appendectomy 1981  . Abdominal hysterectomy 2001  . Left oophorectomy 2001  . Neck surgery 2009    fusion of C1, C2, C3  . Kidney stone surgery 2007  . Myomectomy 1981    History   Social History  . Marital Status: Single    Spouse Name: N/A    Number of Children: 1  . Years of Education: HSG   Occupational History  . Disabled    Social History Main Topics  . Smoking status: Current Some Day Smoker -- 0.5 packs/day for 30 years    Types: Cigarettes  . Smokeless tobacco: Never Used  . Alcohol Use: No  . Drug Use: No  . Sexually Active: Not Currently   Other Topics Concern  . 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 Work-disability due to neck and arthritis problems. Lives-alone. No history of physical or sexual abuse    Current Outpatient Prescriptions on File Prior to Visit  Medication Sig Dispense Refill  . meloxicam (MOBIC) 15 MG tablet Take 1 tablet (15 mg total) by mouth daily.  30 tablet  5  . buPROPion (WELLBUTRIN XL) 150 MG 24 hr tablet Take 1 tablet (150 mg total) by mouth daily.  30 tablet  5  . lovastatin (MEVACOR) 40 MG tablet Take 1 tablet (40 mg total) by mouth daily.  90 tablet  1    Allergies  Allergen Reactions  . Ciprofloxacin     All floxacin drugs  . Ofloxacin     Family History  Problem Relation Age of Onset  . Hypertension Mother   . Diabetes Mother   . Hyperlipidemia Mother   . Heart attack Father     MI  . Hyperlipidemia Father   . Hypertension Father   . Diabetes  Father   . Heart disease Father   . Cancer Neg Hx     Negative for Breast or Colon Cancer    BP 142/88  Pulse 73  Temp 99 F (37.2 C) (Oral)  SpO2 97%   Review of Systems She has low-grade temp, but no earache    Objective:   Physical Exam VITAL SIGNS:  See vs page GENERAL: no distress head: no deformity eyes: no periorbital swelling, no proptosis external nose and ears are normal mouth: no lesion seen Both eac's and tm's are normal LUNGS:  Clear to auscultation  CXR: NAD    Assessment & Plan:  URI, new

## 2012-06-22 NOTE — Patient Instructions (Addendum)
i have sent a prescription to your pharmacy, for an antibiotic pill.   here is a prescription for cough syrup. I hope you feel better soon.  If you don't feel better by next week, please call back.  Please call sooner if you get worse. A chest-x-ray is requested for you today.  You will receive a letter with results.

## 2012-06-23 ENCOUNTER — Telehealth: Payer: Self-pay | Admitting: *Deleted

## 2012-06-23 MED ORDER — CEFUROXIME AXETIL 250 MG PO TABS: 250.0000 mg | ORAL_TABLET | Freq: Two times a day (BID) | ORAL | Status: AC

## 2012-06-23 NOTE — Telephone Encounter (Signed)
Pt needs rx for ABX sent to [facility] on [address] because [facility] [employer] does not have the medication at this time. Rx sent to [facility].

## 2012-07-10 ENCOUNTER — Emergency Department (HOSPITAL_COMMUNITY)
Admission: EM | Admit: 2012-07-10 | Discharge: 2012-07-10 | Disposition: A | Discharge status: Home / Self Care | Payer: Medicare Other | Attending: Emergency Medicine | Admitting: Emergency Medicine

## 2012-07-10 ENCOUNTER — Encounter (HOSPITAL_COMMUNITY): Payer: Self-pay | Admitting: Emergency Medicine

## 2012-07-10 DIAGNOSIS — F329 Major depressive disorder, single episode, unspecified: Secondary | ICD-10-CM | POA: Insufficient documentation

## 2012-07-10 DIAGNOSIS — F17200 Nicotine dependence, unspecified, uncomplicated: Secondary | ICD-10-CM | POA: Insufficient documentation

## 2012-07-10 DIAGNOSIS — M65839 Other synovitis and tenosynovitis, unspecified forearm: Secondary | ICD-10-CM | POA: Insufficient documentation

## 2012-07-10 DIAGNOSIS — Z981 Arthrodesis status: Secondary | ICD-10-CM | POA: Insufficient documentation

## 2012-07-10 DIAGNOSIS — E785 Hyperlipidemia, unspecified: Secondary | ICD-10-CM | POA: Insufficient documentation

## 2012-07-10 DIAGNOSIS — M778 Other enthesopathies, not elsewhere classified: Secondary | ICD-10-CM

## 2012-07-10 DIAGNOSIS — F3289 Other specified depressive episodes: Secondary | ICD-10-CM | POA: Insufficient documentation

## 2012-07-10 DIAGNOSIS — M5030 Other cervical disc degeneration, unspecified cervical region: Secondary | ICD-10-CM | POA: Insufficient documentation

## 2012-07-10 MED ORDER — IBUPROFEN 800 MG PO TABS: 800.0000 mg | ORAL_TABLET | Freq: Three times a day (TID) | ORAL | Status: DC

## 2012-07-10 MED ORDER — KETOROLAC TROMETHAMINE 60 MG/2ML IM SOLN
60.0000 mg | Freq: Once | INTRAMUSCULAR | Status: AC
  Administered 2012-07-10: 60 mg via INTRAMUSCULAR
  Filled 2012-07-10: qty 2

## 2012-07-10 NOTE — ED Notes (Addendum)
Pt. C/o of left wrist pain radiating up her arm that started this evening.. States "its a weakening pain". Denies pain or weakness anywhere else. Stroke scale negative.  Pt denies injury. Pt also c/o nausea.

## 2012-07-10 NOTE — ED Provider Notes (Signed)
Medical screening examination/treatment/procedure(s) were performed by non-physician practitioner and as supervising physician I was immediately available for consultation/collaboration.  Virgen Belland K Amarii Amy-Rasch, MD 07/10/12 0612 

## 2012-07-10 NOTE — ED Provider Notes (Signed)
History     CSN: [id number]  Arrival date & time 07/10/12  0228   First MD Initiated Contact with Patient 07/10/12 [phone]      Chief Complaint  Patient presents with  . Arm Pain    (Consider location/radiation/quality/duration/timing/severity/associated sxs/prior treatment) HPI History provided by pt.   Pt has had mild, throbbing pain in left wrist w/ radiation to elbow for the past several days.  Acutely worsened yesterday evening and is currently severe.  Aggravated by palpation and slightly by ROM of wrist.  No associated fever, skin changes or paresthesias.  Denies CP and SOB.  Denies trauma and does not do any repetitive activities with her hand.    Past Medical History  Diagnosis Date  . DEPRESSION   . HYPERLIPIDEMIA   . UTI'S, CHRONIC   . DISC DISEASE, CERVICAL   . NEPHROLITHIASIS, HX OF     Past Surgical History  Procedure Date  . Appendectomy 1981  . Abdominal hysterectomy 2001  . Left oophorectomy 2001  . Neck surgery 2009    fusion of C1, C2, C3  . Kidney stone surgery 2007  . Myomectomy 1981    Family History  Problem Relation Age of Onset  . Hypertension Mother   . Diabetes Mother   . Hyperlipidemia Mother   . Heart attack Father     MI  . Hyperlipidemia Father   . Hypertension Father   . Diabetes Father   . Heart disease Father   . Cancer Neg Hx     Negative for Breast or Colon Cancer    History  Substance Use Topics  . Smoking status: Current Some Day Smoker -- 0.5 packs/day for 30 years    Types: Cigarettes  . Smokeless tobacco: Never Used  . Alcohol Use: No    OB History    Grav Para Term Preterm Abortions TAB SAB Ect Mult Living                  Review of Systems  All other systems reviewed and are negative.    Allergies  Ciprofloxacin and Ofloxacin  Home Medications   Current Outpatient Rx  Name Route Sig Dispense Refill  . IBUPROFEN 800 MG PO TABS Oral Take 1 tablet (800 mg total) by mouth 3 (three) times daily. 12 tablet  0    BP 159/99  Pulse 97  Temp 99 F (37.2 C) (Oral)  Resp 20  SpO2 99%  Physical Exam  Nursing note and vitals reviewed. Constitutional: She is oriented to person, place, and time. She appears well-developed and well-nourished. No distress.  HENT:  Head: Normocephalic and atraumatic.  Eyes:       Normal appearance  Neck: Normal range of motion.  Pulmonary/Chest: Effort normal.  Musculoskeletal: Normal range of motion.       Left forearm w/out edema or skin changes.  Tenderness most prominent at on flexor surface of wrist.  Mild pain w/ flexion and supination of wrist.  No pain w/ ROM of elbow. 2+ radial pulse and distal sensation intact.      Neurological: She is alert and oriented to person, place, and time.  Psychiatric: She has a normal mood and affect. Her behavior is normal.    ED Course  Procedures (including critical care time)  Labs Reviewed - No data to display No results found.   1. Tendonitis of wrist, left       MDM  56yo F presents w/ non-traumatic L wrist pain.  Tenderness  at flexor surface of wrist and mild pain w/ wrist flexion/supination on exam.  S/sx most consistent w/ tendonitis.  Ortho tech placed in velcrow volar splint and pt received IM toradol.  D/c'd home w/ ibuprofen and recommendation to ice, rest and elevate.  She has a PCP to f/u with for persistent sx.  Return precautions discussed.         Otilio Miu, [place] 07/10/12 [phone]

## 2012-07-20 ENCOUNTER — Ambulatory Visit (INDEPENDENT_AMBULATORY_CARE_PROVIDER_SITE_OTHER): Payer: Medicare Other | Admitting: Surgery

## 2012-07-20 ENCOUNTER — Encounter (INDEPENDENT_AMBULATORY_CARE_PROVIDER_SITE_OTHER): Payer: Self-pay | Admitting: Surgery

## 2012-07-20 VITALS — BP 138/92 | HR 84 | Temp 97.6°F | Ht 66.0 in | Wt 156.8 lb

## 2012-07-20 DIAGNOSIS — L7682 Other postprocedural complications of skin and subcutaneous tissue: Secondary | ICD-10-CM

## 2012-07-20 DIAGNOSIS — R209 Unspecified disturbances of skin sensation: Secondary | ICD-10-CM

## 2012-07-20 NOTE — Progress Notes (Addendum)
Re:   Lauren Orozco DOB:   1956-08-10 MRN:   [id number]  ASSESSMENT AND PLAN: 1.  Nodular area in right aspect of pfannenstiel incision, but I still do not feel a ventral hernia  Since she has had persistent symptoms (pain) going on over 6 months we talked about doing a CT scan abd/pelvis to check her abdominal wall.  I think it will be low yield, but her symptoms have been persistent.  One problem in trying to evaluate and follow Lauren Orozco, she has a lot of pain issues - neck, back, left wrist - all of which obscure her diagnosis.  I will otherwise see her back in 6 months.  If my next exam is unremarkable and the CT scan shows nothing, then her next visit can be her last.  [CT scan - 07/21/2102 - was negative except for a small right adrenal adenoma which has been there since 2008.  Discussed with patient by phone. DN]  2.  Small umbilcal hernia.  I do not recommend surgery on this.  3.  Smokes.  She has not been successful quitting. 4.  Nephrolithiasis. 5.  Disability secondary to neck pain.  Since 2009. 6.  Nausea and problems with BM, chronic this is better. 7.  Anxiety because her sister now has renal failure and is on dialysis.  Chief Complaint  Patient presents with  . Routine Post Op    reck hernia   REFERRING PHYSICIAN: Illene Regulus, MD  HISTORY OF PRESENT ILLNESS: Lauren Orozco is a 56 y.o. (DOB: 07-15-1956)  AA female whose primary care physician is Lauren Regulus, MD and comes to me follow up for right abdominal/groin pain that radiates down her right thigh.  In the last month, she has been treated for a URI, which has resolved.  And she has gone to the ER for left wrist pain.  She comes with a splint on her left wrist. She said that she had this pain prior to her neck surgery in 2009 and the neck surgery was supposed to take care of the wrist pain.  She has not been back in touch with the surgeon who did her neck surgery.   She is continuing to have  right groin pain which radiates down her anterior right leg.  She said that her family is worried about her groin/right leg pain and that she lives by herself.  The pain does not seem to limit her activity.  The pain is unrelated to eating or bowel habits.  She is doing well with her bowels. She has tried heating pads on her abdomen with some symptomatic relief.   Past abdominal history: She had a surgery for an ovarian cyst (side?) and appendectomy in 1981 by Dr. Laureen Orozco. She had a hysterectomy for fibroids by Dr. Amedeo Orozco in 2001.  She saw Dr. Illene Orozco on 20 October 2011. Dr. Debby Orozco was concerned about a hard mass he could feel in her lower abdomen. The patient had no prior history of hernia disease or hernia surgery.    Past Medical History  Diagnosis Date  . DEPRESSION   . HYPERLIPIDEMIA   . UTI'S, CHRONIC   . DISC DISEASE, CERVICAL   . NEPHROLITHIASIS, HX OF      Current Outpatient Prescriptions  Medication Sig Dispense Refill  . ibuprofen (ADVIL,MOTRIN) 800 MG tablet Take 1 tablet (800 mg total) by mouth 3 (three) times daily.  12 tablet  0    Allergies  Allergen Reactions  .  Ciprofloxacin     All floxacin drugs  . Ofloxacin     REVIEW OF SYSTEMS: Pulmonary:  Smokes.  Unsuccessful at quitting. Endocrine:  No diabetes. No thyroid disease. Gastrointestinal:  See HPI. Urologic:  No history of kidney stones.  No history of bladder infections. Musculoskeletal:  Chronic back pain.  Cervical spine surgery in 2009 by Lauren Orozco at [facility].  On disability since then. Hematologic:  No bleeding disorder.  No history of anemia.  Not anticoagulated. Psycho-social:  The patient is oriented.   On Lauren Orozco for anxiety/qutting smoking.  SOCIAL and FAMILY HISTORY: Single.  Lives by self. Has one daughter in college.  PHYSICAL EXAM: BP 138/92  Pulse 84  Temp 97.6 F (36.4 C) (Temporal)  Ht 5\' 6"  (1.676 m)  Wt 156 lb 12.8 oz (71.124 kg)  BMI 25.31 kg/m2  General: AA  F who is alert and generally healthy appearing.  HEENT: Normal. Pupils equal. Good dentition. Lungs: Clear to auscultation and symmetric breath sounds. Heart:  RRR. No murmur or rub.  Abdomen: Soft. Small umbilical hernia, which is reducible.  Though she has some nodularity to her lower abdomen, I can not feel a ventral hernia, either supine or standing.  Her pfannenstiel incision is 13 cm transversely.  She points to an area about 3 to 4 cm below the right corner of the pfannenstiel incision as the location of her pain. Rectal: Not done. Extremities:  Left wrist is in a splint. Neurologic:  Grossly intact to motor and sensory function. Psychiatric: Has normal mood and affect.  DATA REVIEWED: ER notes.  Ovidio Kin, MD,  [facility] [address] [place]  [address]   [place], [place]    [address] Phone:  [phone] FAX:  [phone]

## 2012-07-21 ENCOUNTER — Ambulatory Visit
Admission: RE | Admit: 2012-07-21 | Discharge: 2012-07-21 | Disposition: A | Discharge status: Home / Self Care | Payer: Medicare Other | Source: Ambulatory Visit | Attending: Surgery | Admitting: Surgery

## 2012-07-21 MED ORDER — IOHEXOL 300 MG/ML  SOLN
100.0000 mL | Freq: Once | INTRAMUSCULAR | Status: AC | PRN
  Administered 2012-07-21: 100 mL via INTRAVENOUS

## 2012-11-19 ENCOUNTER — Other Ambulatory Visit: Payer: Self-pay

## 2012-12-21 ENCOUNTER — Ambulatory Visit: Payer: Medicare Other | Admitting: Internal Medicine

## 2012-12-29 ENCOUNTER — Ambulatory Visit (INDEPENDENT_AMBULATORY_CARE_PROVIDER_SITE_OTHER)
Admission: RE | Admit: 2012-12-29 | Discharge: 2012-12-29 | Disposition: A | Discharge status: Home / Self Care | Payer: Medicare Other | Source: Ambulatory Visit | Attending: Internal Medicine | Admitting: Internal Medicine

## 2012-12-29 ENCOUNTER — Ambulatory Visit (INDEPENDENT_AMBULATORY_CARE_PROVIDER_SITE_OTHER): Payer: Medicare Other | Admitting: Internal Medicine

## 2012-12-29 ENCOUNTER — Encounter: Payer: Self-pay | Admitting: Internal Medicine

## 2012-12-29 VITALS — BP 132/86 | HR 84 | Temp 98.4°F | Resp 12 | Ht 66.0 in | Wt 157.0 lb

## 2012-12-29 DIAGNOSIS — M25561 Pain in right knee: Secondary | ICD-10-CM | POA: Insufficient documentation

## 2012-12-29 DIAGNOSIS — M25569 Pain in unspecified knee: Secondary | ICD-10-CM

## 2012-12-29 MED ORDER — IBUPROFEN 800 MG PO TABS: 800.0000 mg | ORAL_TABLET | Freq: Three times a day (TID) | ORAL | Status: DC

## 2012-12-29 NOTE — Progress Notes (Signed)
  Subjective:    Patient ID: Lauren Orozco, female    DOB: [birth year], 57 y.o.   MRN: [id number]  HPI Lauren Orozco presents for knee pain right. She has minor swelling. She has had her knee give out. Heat and NSAIDs help. She is able to do most of her normal activities. She has not had prior x-rays.   PMH, FamHx and SocHx reviewed for any changes and relevance.  Current Outpatient Prescriptions on File Prior to Visit  Medication Sig Dispense Refill  . ibuprofen (ADVIL,MOTRIN) 800 MG tablet Take 1 tablet (800 mg total) by mouth 3 (three) times daily.  12 tablet  0   No current facility-administered medications on file prior to visit.      Review of Systems System review is negative for any constitutional, cardiac, pulmonary, GI or neuro symptoms or complaints other than as described in the HPI.     Objective:   Physical Exam Filed Vitals:   12/29/12 1456  BP: 132/86  Pulse: 84  Temp: 98.4 F (36.9 C)  Resp: 12  . Wt Readings from Last 3 Encounters:  12/29/12 157 lb (71.215 kg)  07/20/12 156 lb 12.8 oz (71.124 kg)  12/07/11 157 lb 12 oz (71.555 kg)    Gen'l WNWD AA woman in no distress Cor 2+ radial pulse Pulm - normal respirations MSK - right knee w/o click, crepitus, negative drawer sign, no lateral or medial instability, no effusion. Greatest tenderness to palpation is along the medial joint line just at insertion of vastus medialis and sartorious.  Right Knee x-ray: RIGHT KNEE - COMPLETE 4+ VIEW  Comparison: None.  Findings: No acute fracture or dislocation is identified. No gross  soft tissue abnormality is seen. No joint effusion is noted.  IMPRESSION:  No acute abnormality noted.         Assessment & Plan:  Pain at right knee - early OA vs tendonitis  Plan With X-ray being negative - APAP, rub of choice. May use NSAIDs but cautioned about stomach upset

## 2012-12-29 NOTE — Patient Instructions (Addendum)
Right knee pain - good movement on exam today. Suspect early degenerative joint disease.  Plan Bilateral knee x-rays to establish the degree of wear and tear in the joint(s)  Rub of choice along with heat  Ibuprofen as needed.  Wear and Tear Disorders of the Knee (Arthritis, Osteoarthritis) Everyone will experience wear and tear injuries (arthritis, osteoarthritis) of the knee. These are the changes we all get as we age. They come from the joint stress of daily living. The amount of cartilage damage in your knee and your symptoms determine if you need surgery. Mild problems require approximately two months recovery time. More severe problems take several months to recover. With mild problems, your surgeon may find worn and rough cartilage surfaces. With severe changes, your surgeon may find cartilage that has completely worn away and exposed the bone. Loose bodies of bone and cartilage, bone spurs (excess bone growth), and injuries to the menisci (cushions between the large bones of your leg) are also common. All of these problems can cause pain. For a mild wear and tear problem, rough cartilage may simply need to be shaved and smoothed. For more severe problems with areas of exposed bone, your surgeon may use an instrument for roughing up the bone surfaces to stimulate new cartilage growth. Loose bodies are usually removed. Torn menisci may be trimmed or repaired. ABOUT THE ARTHROSCOPIC PROCEDURE Arthroscopy is a surgical technique. It allows your orthopedic surgeon to diagnose and treat your knee injury with accuracy. The surgeon looks into your knee through a small scope. The scope is like a small (pencil-sized) telescope. Arthroscopy is less invasive than open knee surgery. You can expect a more rapid recovery. After the procedure, you will be moved to a recovery area until most of the effects of the medication have worn off. Your caregiver will discuss the test results with you. RECOVERY The  severity of the arthritis and the type of procedure performed will determine recovery time. Other important factors include age, physical condition, medical conditions, and the type of rehabilitation program. Strengthening your muscles after arthroscopy helps guarantee a better recovery. Follow your caregiver's instructions. Use crutches, rest, elevate, ice, and do knee exercises as instructed. Your caregivers will help you and instruct you with exercises and other physical therapy required to regain your mobility, muscle strength, and functioning following surgery. Only take over-the-counter or prescription medicines for pain, discomfort, or fever as directed by your caregiver.  SEEK MEDICAL CARE IF:   There is increased bleeding (more than a small spot) from the wound.  You notice redness, swelling, or increasing pain in the wound.  Pus is coming from wound.  You develop an unexplained oral temperature above 102 F (38.9 C) , or as your caregiver suggests.  You notice a foul smell coming from the wound or dressing.  You have severe pain with motion of the knee. SEEK IMMEDIATE MEDICAL CARE IF:   You develop a rash.  You have difficulty breathing.  You have any allergic problems. MAKE SURE YOU:   Understand these instructions.  Will watch your condition.  Will get help right away if you are not doing well or get worse. Document Released: 09/18/2000 Document Revised: 12/14/2011 Document Reviewed: 02/15/2008 [facility] Patient Information 2013 [place], [place].

## 2013-02-09 ENCOUNTER — Ambulatory Visit (INDEPENDENT_AMBULATORY_CARE_PROVIDER_SITE_OTHER): Payer: Medicare Other | Admitting: Surgery

## 2013-05-23 ENCOUNTER — Encounter: Payer: Self-pay | Admitting: Internal Medicine

## 2013-05-23 ENCOUNTER — Ambulatory Visit (INDEPENDENT_AMBULATORY_CARE_PROVIDER_SITE_OTHER): Payer: Medicare Other | Admitting: Internal Medicine

## 2013-05-23 VITALS — BP 124/82 | HR 77 | Temp 99.7°F | Wt 152.0 lb

## 2013-05-23 DIAGNOSIS — K0401 Reversible pulpitis: Secondary | ICD-10-CM

## 2013-05-23 MED ORDER — AMOXICILLIN-POT CLAVULANATE 875-125 MG PO TABS: 1.0000 | ORAL_TABLET | Freq: Two times a day (BID) | ORAL | Status: DC

## 2013-05-23 NOTE — Patient Instructions (Addendum)
Thanks for working with me Lauren Orozco) today!  It sounds like you do have an infection in your gums.  We prescribed Augmentin, an antibiotic, to help clear the infection.  It will help your body clear the infection.  An NSAID (such as ibuprofen) will help with the inflammation and pain.  You should take generic Zantac or Pepsid to accompany the NSAIDs and help your protect stomach.  Plan: take Augmentin twice daily for one week.  Please make sure to use the full course of the antibiotic. Continue to take ibuprofen to help with the pain and inflammation. Take ranitidine (generic Zantac) or Famotidine (generic Pepsid) to help protect your stomach from problems due to NSAIDs.

## 2013-05-23 NOTE — Progress Notes (Signed)
Subjective:     Patient ID: Lauren Orozco, female   DOB: 1956/08/03, 57 y.o.   MRN: [id number]  HPI Lauren Orozco is a 57 year-old lady here for pressure and swelling in her face which has been present since Saturday.  She went to a dental clinic ([facility]) last Friday and Saturday.  The dentists there pulled five teeth and told her that one of them had an abscess underneath it.  On Sunday morning, she felt pressure behind her upper lip which she attributes to a buildup of pus.  The pressure has slightly improved since Sunday.  She also feels like it is draining into the nasal passages and states that she feels she can "taste" the infection.  She endorses nausea from the taste.  She states that it does not cause her any pain unless it is pressed on.  Lauren Orozco endorses chills, a low grade fever since Friday and clammyness.  She denies shakes and cough.  She has only taken ibuprofen for this and feels that it has been helping.  She has not detected anything which exacerbates the pressure.      Review of Systems Constitutional: no appetite change or fatigue HEENT: no ear pain, difficulty hearing, tinnitus, eye pain or changes in vision CV: no palpitations, no chest pain Pulm: no choking, wheezing or shortness of breath GI: negative for difficulty swallowing, constipation, diarrhea or blood in the stool  Past Medical History  Diagnosis Date   DEPRESSION    HYPERLIPIDEMIA    UTI'S, CHRONIC    DISC DISEASE, CERVICAL    NEPHROLITHIASIS, HX OF    Past Surgical History  Procedure Laterality Date   Appendectomy  1981   Abdominal hysterectomy  2001   Left oophorectomy  2001   Neck surgery  2009    fusion of C1, C2, C3   Kidney stone surgery  2007   Myomectomy  1981   Family History  Problem Relation Age of Onset   Hypertension Mother    Diabetes Mother    Hyperlipidemia Mother    Heart attack Father     MI   Hyperlipidemia Father    Hypertension Father     Diabetes Father    Heart disease Father    Cancer Neg Hx     Negative for Breast or Colon Cancer   History   Social History   Marital Status: Single    Spouse Name: N/A    Number of Children: 1   Years of Education: HSG   Occupational History   Disabled    Social History Main Topics   Smoking status: Current Some Day Smoker -- 0.50 packs/day for 30 years    Types: Cigarettes   Smokeless tobacco: Never Used   Alcohol Use: No   Drug Use: No   Sexual Activity: Not Currently   Other Topics Concern   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 Work-disability due to neck and arthritis problems. Lives-alone. No history of physical or sexual abuse       Objective:   Physical Exam Gen'l: 57 YO lady in NAD HEENT: palantine process tender to palpitation. No visible oral lesion at the gum line - no open wound, no swelling CV: RRR, nl S1/S2, no murmurs, gallops or rubs. Pulm: negative for increased WOB, chest tenderness, wheezes or rales Neuro A&O x3, CN 2-12 bilaterally intact    Assessment  and Plan:     Infected Gum -  Lauren Orozco' presentation and complaint is consistent an infection in the gums.   Plan:  Take Augmentin twice per day for seven days. ( Augmentin is indicated for oral infections of both aerobes and anaerobes)  An NSAID (such as ibuprofen) will help decrease the inflammation and pain.  Generic Zantac or Pepsid will help protect the stomach for the anti-inflammatory effects of chronic NSAID use.

## 2013-07-12 ENCOUNTER — Ambulatory Visit (INDEPENDENT_AMBULATORY_CARE_PROVIDER_SITE_OTHER): Payer: Medicare Other | Admitting: Internal Medicine

## 2013-07-12 ENCOUNTER — Encounter: Payer: Self-pay | Admitting: Internal Medicine

## 2013-07-12 VITALS — BP 130/84 | HR 70 | Temp 98.2°F | Wt 151.0 lb

## 2013-07-12 DIAGNOSIS — F3289 Other specified depressive episodes: Secondary | ICD-10-CM

## 2013-07-12 DIAGNOSIS — R195 Other fecal abnormalities: Secondary | ICD-10-CM | POA: Insufficient documentation

## 2013-07-12 DIAGNOSIS — M353 Polymyalgia rheumatica: Secondary | ICD-10-CM

## 2013-07-12 DIAGNOSIS — F329 Major depressive disorder, single episode, unspecified: Secondary | ICD-10-CM

## 2013-07-12 NOTE — Assessment & Plan Note (Signed)
07/12/13: Presents with diffuse muscle and joint aches with a benign physical exam. Continue to use heating pads as needed for pain control.

## 2013-07-12 NOTE — Assessment & Plan Note (Signed)
07/12/13: Reports 3 days of green stools with no other changes in bowel movements. Bristol scale 4. Occasional constipation for which typically takes Senna. Also reports sensation of occasional stomach fluttering. Benign physical exam. Most likely idiopathic and benign but may represent change in small bowel flora.   Plan: Take metamucil or other bulk laxative. Consider probiotics such as align or florastor.

## 2013-07-12 NOTE — Patient Instructions (Signed)
Good to see you.  Green stool is not a medical problem. Your exam was normal. It may that there has been a little derangement in the normal bowel flora. Plan Take a bulk laxative, like metamucil, daily to regulate your bowels and give you a good bowel habit  Take a probiotic, Align or Florastor  PMR - it is what it is.

## 2013-07-12 NOTE — Progress Notes (Signed)
Subjective:     Patient ID: Lauren Orozco, female   DOB: October 19, 1955, 57 y.o.   MRN: [id number]  HPI Ms. Lauren Orozco is a 57 year old woman who presents with "stomach fluttering." No pain, just a weird feeling and has to bend over. No N/V. Stools were green like turnip greens and like type 4 on bristol stool chart.   No blood in stools. This feels like how she felt when pregnant--is now s/p partial hysterectomy. Spoke with Dr. Danella Sensing surgeon who follows her for an abdominal hernia, who referred her to come here. No major changes in diet. Has had recent constipation, for which she takes [place] which controls it. Normally has about 1 bowel movement each day after drinking coffee, and this has not changed.    Review of Systems General: No fever, chills or night sweats. Has been intentionally trying to lose weight, and has lost about 6 lbs in the past 6 months. Sleep is not restful due to arm pain.  CV: No chest pain, tightness or pressure. No DOE.  Pulm: No coughing, wheezing.  GI: As per HPI.   MSK: Left hip pain for years, not taking any medicines. Sharp pains  "all down my arms all day long" Are positional and worsened by turning her head and laying down. Uses heating pad which helps some.  Neuro: Feels off-balance when she is standing up. Frontal, crushing HA for the past month, happen at night and when she closes her eyes feels "spin-y, like I'm falling".   No changes in gait. No changes in vision. Uro/Gyn: No dysuria, no urinary frequency. No vaginal discharge. No feeling of fullness. Post-menopausal 4-5 years ago.  Psych: Admits to periods of low mood and tearfulness due to stress she is dealing with. Says she is dealing with by her strong faith in god and supportive church community. Continues to be involved in her normal activities. Denies any SI, and knows to call here if that ever changes.     Objective:   Physical Exam Filed Vitals:   07/12/13 1129  BP: 130/84  Pulse: 70  Temp:  98.2 F (36.8 C)   General: Cooperative woman sitting in the exam room in no acute distress.  CV: RRR Pulm: CTAB Abdominal: Normal active bowel sounds. Abdomen is soft. LLQ and RLQ tenderness to light and deep palpation, no rebound or guarding.  MSK: Some point tenderness over right lateral epicondyle. No other point tenderness. No swelling or erythema of UE joints. No pain on supination or pronation. On should abduction, feels "tired"  No current outpatient prescriptions on file prior to visit.   No current facility-administered medications on file prior to visit.       Assessment:     Ms. Lauren Orozco is a 57 year old woman with a history of depression, chronic UTIs, hyperlipidemia, polymyalgia rheumatic, an inguinal hernia, and tobacco abuse who presents today with green stools and a sensation of non-painful stomach fluttering and no other changes in bowel habits. Given the lack of pain, no systemic signs of infection, and benign physical exam, her GI changes are likely completely benign, but may represent a change in her bowel flora. On review of systems, continues to describe diffuse joint and muscle pains consistent with polymyalgia rheumatica with a benign physical exam.     Plan:     See plan by problem list.

## 2013-07-12 NOTE — Assessment & Plan Note (Signed)
07/12/13: Reports episodes of tearfulness and low mood. Reports good social supports. No SI.

## 2013-08-10 ENCOUNTER — Other Ambulatory Visit: Payer: Self-pay

## 2013-09-13 ENCOUNTER — Ambulatory Visit (INDEPENDENT_AMBULATORY_CARE_PROVIDER_SITE_OTHER): Payer: Medicare Other | Admitting: Internal Medicine

## 2013-09-13 ENCOUNTER — Encounter: Payer: Self-pay | Admitting: Internal Medicine

## 2013-09-13 VITALS — BP 138/90 | HR 74 | Temp 99.1°F | Wt 150.8 lb

## 2013-09-13 DIAGNOSIS — K5733 Diverticulitis of large intestine without perforation or abscess with bleeding: Secondary | ICD-10-CM

## 2013-09-13 MED ORDER — AMOXICILLIN-POT CLAVULANATE 875-125 MG PO TABS: 1.0000 | ORAL_TABLET | Freq: Two times a day (BID) | ORAL | Status: DC

## 2013-09-13 NOTE — Progress Notes (Signed)
Subjective:    Patient ID: Lauren Orozco, female    DOB: 11/24/1955, 57 y.o.   MRN: [id number]  HPI Lauren Orozco presents for evaluation of hematochezia. Monday she took a laxative. Starting Tuesday she had prolonged diarrhea to the point of weakness. Since that time she has had multiple episodes of frank hematochezia: with sitting on the commode she will have bleeding with flatus or a BM. She does have a history of internal hemorrhoids. NO recrtal pain. She has never had a colonoscopy. She does have abdominal pain and she has had fevers since last night. She feels generally sick.  Past Medical History  Diagnosis Date  . DEPRESSION   . HYPERLIPIDEMIA   . UTI'S, CHRONIC   . DISC DISEASE, CERVICAL   . NEPHROLITHIASIS, HX OF    Past Surgical History  Procedure Laterality Date  . Appendectomy  1981  . Abdominal hysterectomy  2001  . Left oophorectomy  2001  . Neck surgery  2009    fusion of C1, C2, C3  . Kidney stone surgery  2007  . Myomectomy  1981   Family History  Problem Relation Age of Onset  . Hypertension Mother   . Diabetes Mother   . Hyperlipidemia Mother   . Heart attack Father     MI  . Hyperlipidemia Father   . Hypertension Father   . Diabetes Father   . Heart disease Father   . Cancer Neg Hx     Negative for Breast or Colon Cancer   History   Social History  . Marital Status: Single    Spouse Name: N/A    Number of Children: 1  . Years of Education: HSG   Occupational History  . Disabled    Social History Main Topics  . Smoking status: Current Some Day Smoker -- 0.50 packs/day for 30 years    Types: Cigarettes  . Smokeless tobacco: Never Used  . Alcohol Use: No  . Drug Use: No  . Sexual Activity: Not Currently   Other Topics Concern  . 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 Work-disability due to neck and arthritis problems. Lives-alone. No history of  physical or sexual abuse    No current outpatient prescriptions on file prior to visit.   No current facility-administered medications on file prior to visit.      Review of Systems System review is negative for any constitutional, cardiac, pulmonary, GI or neuro symptoms or complaints other than as described in the HPI.      Objective:   Physical Exam Filed Vitals:   09/13/13 1714  BP: 138/90  Pulse: 74  Temp: 99.1 F (37.3 C)    gen'l - WNWD woman  HEENT- C&S clear Cor - RRR PUlm - normal respirations Abd- BS+ x 4, no guarding but she is tender to percussion and palpation worse at the lower quadrant, R>L.  Rectal - deferred  - no chaperone.      Assessment & Plan:  Diverticulitis - with blood diarrhea, lower abdominal pain and tenderness and low grade fever favor diverticulitis over internal hemorrhoids as source of blood stools. She has never had a colonoscopy.  Plan Augmentin bid x 7 day  Tylenol for fever and pain  Bland diet - grits, rice, cereal, etc.  Hydrate  Take a bulk laxative daily, e.g. Metamucil, benefiber  For continued fever, pain or worsening bleeding call.

## 2013-09-13 NOTE — Progress Notes (Signed)
Pre visit review using our clinic review tool, if applicable. No additional management support is needed unless otherwise documented below in the visit note. 

## 2013-09-13 NOTE — Patient Instructions (Signed)
Diverticulitis - with blood diarrhea, lower abdominal pain and tenderness and low grade fever favor diverticulitis over internal hemorrhoids as source of blood stools.   Plan Augmentin bid x 7 day  Tylenol for fever and pain  Bland diet - grits, rice, cereal, etc.  Hydrate  Take a bulk laxative daily, e.g. Metamucil, benefiber  For continued fever, pain or worsening bleeding call.   Diverticulitis A diverticulum is a small pouch or sac on the colon. Diverticulosis is the presence of these diverticula on the colon. Diverticulitis is the irritation (inflammation) or infection of diverticula. CAUSES  The colon and its diverticula contain bacteria. If food particles block the tiny opening to a diverticulum, the bacteria inside can grow and cause an increase in pressure. This leads to infection and inflammation and is called diverticulitis. SYMPTOMS   Abdominal pain and tenderness. Usually, the pain is located on the left side of your abdomen. However, it could be located elsewhere.  Fever.  Bloating.  Feeling sick to your stomach (nausea).  Throwing up (vomiting).  Abnormal stools. DIAGNOSIS  Your caregiver will take a history and perform a physical exam. Since many things can cause abdominal pain, other tests may be necessary. Tests may include:  Blood tests.  Urine tests.  X-ray of the abdomen.  CT scan of the abdomen. Sometimes, surgery is needed to determine if diverticulitis or other conditions are causing your symptoms. TREATMENT  Most of the time, you can be treated without surgery. Treatment includes:  Resting the bowels by only having liquids for a few days. As you improve, you will need to eat a low-fiber diet.  Intravenous (IV) fluids if you are losing body fluids (dehydrated).  Antibiotic medicines that treat infections may be given.  Pain and nausea medicine, if needed.  Surgery if the inflamed diverticulum has burst. HOME CARE INSTRUCTIONS   Try a clear  liquid diet (broth, tea, or water for as long as directed by your caregiver). You may then gradually begin a low-fiber diet as tolerated.  A low-fiber diet is a diet with less than 10 grams of fiber. Choose the foods below to reduce fiber in the diet:  White breads, cereals, rice, and pasta.  Cooked fruits and vegetables or soft fresh fruits and vegetables without the skin.  Ground or well-cooked tender beef, ham, veal, lamb, pork, or poultry.  Eggs and seafood.  After your diverticulitis symptoms have improved, your caregiver may put you on a high-fiber diet. A high-fiber diet includes 14 grams of fiber for every 1000 calories consumed. For a standard 2000 calorie diet, you would need 28 grams of fiber. Follow these diet guidelines to help you increase the fiber in your diet. It is important to slowly increase the amount fiber in your diet to avoid gas, constipation, and bloating.  Choose whole-grain breads, cereals, pasta, and brown rice.  Choose fresh fruits and vegetables with the skin on. Do not overcook vegetables because the more vegetables are cooked, the more fiber is lost.  Choose more nuts, seeds, legumes, dried peas, beans, and lentils.  Look for food products that have greater than 3 grams of fiber per serving on the Nutrition Facts label.  Take all medicine as directed by your caregiver.  If your caregiver has given you a follow-up appointment, it is very important that you go. Not going could result in lasting (chronic) or permanent injury, pain, and disability. If there is any problem keeping the appointment, call to reschedule. SEEK MEDICAL CARE IF:  Your pain does not improve.  You have a hard time advancing your diet beyond clear liquids.  Your bowel movements do not return to normal. SEEK IMMEDIATE MEDICAL CARE IF:   Your pain becomes worse.  You have an oral temperature above 102 F (38.9 C), not controlled by medicine.  You have repeated vomiting.  You  have bloody or black, tarry stools.  Symptoms that brought you to your caregiver become worse or are not getting better. MAKE SURE YOU:   Understand these instructions.  Will watch your condition.  Will get help right away if you are not doing well or get worse. Document Released: 07/01/2005 Document Revised: 12/14/2011 Document Reviewed: 10/27/2010 [facility] Patient Information 2014 [place], [place].

## 2013-09-15 ENCOUNTER — Telehealth: Payer: Self-pay

## 2013-09-15 NOTE — Telephone Encounter (Signed)
Patient returned call. She states no improvement. She is taking the antibiotic. She's constipated and has had a low fever. Please advise.

## 2013-09-15 NOTE — Telephone Encounter (Signed)
Phone call to patient today (I forgot to call yesterday, per Dr Debby Bud) left a message for her to return call so we can see how she is feeling.

## 2013-09-15 NOTE — Telephone Encounter (Signed)
Called patient - left her a message to call me back. - left my cell #

## 2013-09-20 ENCOUNTER — Telehealth: Payer: Self-pay

## 2013-09-20 ENCOUNTER — Encounter (HOSPITAL_COMMUNITY): Payer: Self-pay | Admitting: Emergency Medicine

## 2013-09-20 ENCOUNTER — Emergency Department (HOSPITAL_COMMUNITY)
Admission: EM | Admit: 2013-09-20 | Discharge: 2013-09-20 | Disposition: A | Discharge status: Home / Self Care | Payer: Medicare Other | Attending: Emergency Medicine | Admitting: Emergency Medicine

## 2013-09-20 ENCOUNTER — Emergency Department (HOSPITAL_COMMUNITY): Payer: Medicare Other

## 2013-09-20 DIAGNOSIS — M545 Low back pain, unspecified: Secondary | ICD-10-CM | POA: Insufficient documentation

## 2013-09-20 DIAGNOSIS — Z8639 Personal history of other endocrine, nutritional and metabolic disease: Secondary | ICD-10-CM | POA: Insufficient documentation

## 2013-09-20 DIAGNOSIS — M79609 Pain in unspecified limb: Secondary | ICD-10-CM | POA: Insufficient documentation

## 2013-09-20 DIAGNOSIS — F17200 Nicotine dependence, unspecified, uncomplicated: Secondary | ICD-10-CM | POA: Insufficient documentation

## 2013-09-20 DIAGNOSIS — Z87442 Personal history of urinary calculi: Secondary | ICD-10-CM | POA: Insufficient documentation

## 2013-09-20 DIAGNOSIS — Z8659 Personal history of other mental and behavioral disorders: Secondary | ICD-10-CM | POA: Insufficient documentation

## 2013-09-20 DIAGNOSIS — Z862 Personal history of diseases of the blood and blood-forming organs and certain disorders involving the immune mechanism: Secondary | ICD-10-CM | POA: Insufficient documentation

## 2013-09-20 DIAGNOSIS — Z792 Long term (current) use of antibiotics: Secondary | ICD-10-CM | POA: Insufficient documentation

## 2013-09-20 DIAGNOSIS — Z87440 Personal history of urinary (tract) infections: Secondary | ICD-10-CM | POA: Insufficient documentation

## 2013-09-20 DIAGNOSIS — K5732 Diverticulitis of large intestine without perforation or abscess without bleeding: Secondary | ICD-10-CM | POA: Insufficient documentation

## 2013-09-20 DIAGNOSIS — K5792 Diverticulitis of intestine, part unspecified, without perforation or abscess without bleeding: Secondary | ICD-10-CM

## 2013-09-20 LAB — CBC WITH DIFFERENTIAL/PLATELET
Hemoglobin: 14 g/dL (ref 12.0–15.0)
Lymphocytes Relative: 63 % — ABNORMAL HIGH (ref 12–46)
Lymphs Abs: 5 10*3/uL — ABNORMAL HIGH (ref 0.7–4.0)
Monocytes Relative: 8 % (ref 3–12)
Neutrophils Relative %: 25 % — ABNORMAL LOW (ref 43–77)
Platelets: 349 10*3/uL (ref 150–400)
RBC: 4.56 MIL/uL (ref 3.87–5.11)
WBC: 7.9 10*3/uL (ref 4.0–10.5)

## 2013-09-20 LAB — URINALYSIS, ROUTINE W REFLEX MICROSCOPIC
Glucose, UA: NEGATIVE mg/dL
Hgb urine dipstick: NEGATIVE
Leukocytes, UA: NEGATIVE
Specific Gravity, Urine: 1.03 (ref 1.005–1.030)

## 2013-09-20 LAB — COMPREHENSIVE METABOLIC PANEL
ALT: 13 U/L (ref 0–35)
BUN: 13 mg/dL (ref 6–23)
CO2: 26 mEq/L (ref 19–32)
Chloride: 103 mEq/L (ref 96–112)
Glucose, Bld: 115 mg/dL — ABNORMAL HIGH (ref 70–99)
Potassium: 4.3 mEq/L (ref 3.5–5.1)
Sodium: 140 mEq/L (ref 135–145)

## 2013-09-20 MED ORDER — OXYCODONE-ACETAMINOPHEN 5-325 MG PO TABS: 1.0000 | ORAL_TABLET | ORAL | Status: DC | PRN

## 2013-09-20 MED ORDER — IOHEXOL 300 MG/ML  SOLN
80.0000 mL | Freq: Once | INTRAMUSCULAR | Status: AC | PRN
  Administered 2013-09-20: 80 mL via INTRAVENOUS

## 2013-09-20 MED ORDER — MORPHINE SULFATE 4 MG/ML IJ SOLN
4.0000 mg | Freq: Once | INTRAMUSCULAR | Status: AC
  Administered 2013-09-20: 4 mg via INTRAVENOUS
  Filled 2013-09-20: qty 1

## 2013-09-20 MED ORDER — ONDANSETRON 4 MG PO TBDP: 4.0000 mg | ORAL_TABLET | Freq: Three times a day (TID) | ORAL | Status: DC | PRN

## 2013-09-20 MED ORDER — ONDANSETRON HCL 4 MG/2ML IJ SOLN
4.0000 mg | Freq: Once | INTRAMUSCULAR | Status: AC
  Administered 2013-09-20: 4 mg via INTRAVENOUS
  Filled 2013-09-20: qty 2

## 2013-09-20 MED ORDER — SODIUM CHLORIDE 0.9 % IV SOLN
INTRAVENOUS | Status: DC
  Administered 2013-09-20: 07:00:00 via INTRAVENOUS

## 2013-09-20 MED ORDER — IOHEXOL 300 MG/ML  SOLN
25.0000 mL | INTRAMUSCULAR | Status: AC
  Administered 2013-09-20: 25 mL via ORAL

## 2013-09-20 NOTE — Telephone Encounter (Signed)
ED visit notes, labs and CT scan reviewed. CT confirmed diverticulitis.  If she is not improving the next step may be in -patient treatment. If seeing some improvement would continue antibiotic regimen.

## 2013-09-20 NOTE — ED Notes (Signed)
IV removed from left tape applied with 2x2 gauze.

## 2013-09-20 NOTE — ED Notes (Signed)
Pt returned from CT. VSS.

## 2013-09-20 NOTE — Telephone Encounter (Signed)
Phone call to patient and let her know you advise. She states no improvement. She is still having the pain down her leg.

## 2013-09-20 NOTE — ED Notes (Signed)
Patient with abdominal pain since last week.  Patient states that she has been treated for diverticulitis by PCP.  Patient continues with abdominal pain, no vomiting, nausea or diarrhea.  Patient states that her left leg is in pain from her groin all the way down her leg to her knee.

## 2013-09-20 NOTE — Telephone Encounter (Signed)
Phone call from patient she states she went to [facility] ER. CT was done, stool still seen on CT. Abdominal pain radiates down her left leg. She was advised to call her PCP. She can be reached at her sisters house, [phone]

## 2013-09-20 NOTE — ED Provider Notes (Signed)
CSN: [id number]     Arrival date & time 09/20/13  4098 History   First MD Initiated Contact with Patient 09/20/13 [phone]     Chief Complaint  Patient presents with  . Abdominal Pain  . Leg Pain   (Consider location/radiation/quality/duration/timing/severity/associated sxs/prior Treatment) The history is provided by the patient and medical records.   This is a 57 year old female with past history significant for hyperlipidemia, chronic UTIs, kidney stones, presenting to the ED for abdominal pain. Patient was treated for suspected diverticulitis last week by her PCP, Dr. Debby Bud.  Pt was experiencing left and right lower abdominal pain, nausea, diarrhea with mild hematochezia, and fever.  Pt was started on augmentin and states sx initially improved with but have since worsened. Pt states last episode of hematochezia was 4 days ago. States she now is experiencing some pain in her left leg that radiates down her left lateral thigh but does not descend past the knee.  No difficulty walking but states it is painful.  No recent falls, injury, or trauma.  No numbness, paresthesias, of weakness of left leg.  Denies urinary sx or vaginal complaints.  Pt has never had a colonoscopy.  Denies chest pain, SOB, fatigue, generalized weakness, or syncopal events.  Past Medical History  Diagnosis Date  . DEPRESSION   . HYPERLIPIDEMIA   . UTI'S, CHRONIC   . DISC DISEASE, CERVICAL   . NEPHROLITHIASIS, HX OF    Past Surgical History  Procedure Laterality Date  . Appendectomy  1981  . Abdominal hysterectomy  2001  . Left oophorectomy  2001  . Neck surgery  2009    fusion of C1, C2, C3  . Kidney stone surgery  2007  . Myomectomy  1981   Family History  Problem Relation Age of Onset  . Hypertension Mother   . Diabetes Mother   . Hyperlipidemia Mother   . Heart attack Father     MI  . Hyperlipidemia Father   . Hypertension Father   . Diabetes Father   . Heart disease Father   . Cancer Neg Hx      Negative for Breast or Colon Cancer   History  Substance Use Topics  . Smoking status: Current Some Day Smoker -- 0.50 packs/day for 30 years    Types: Cigarettes  . Smokeless tobacco: Never Used  . Alcohol Use: No   OB History   Grav Para Term Preterm Abortions TAB SAB Ect Mult Living                 Review of Systems  Gastrointestinal: Positive for abdominal pain.  All other systems reviewed and are negative.    Allergies  Ciprofloxacin and Ofloxacin  Home Medications   Current Outpatient Rx  Name  Route  Sig  Dispense  Refill  . amoxicillin-clavulanate (AUGMENTIN) 875-125 MG per tablet   Oral   Take 1 tablet by mouth 2 (two) times daily.   20 tablet   0    BP 139/90  Pulse 80  Temp(Src) 98 F (36.7 C) (Oral)  Resp 16  SpO2 99%  Physical Exam  Nursing note and vitals reviewed. Constitutional: She is oriented to person, place, and time. She appears well-developed and well-nourished. No distress.  HENT:  Head: Normocephalic and atraumatic.  Mouth/Throat: Oropharynx is clear and moist.  Eyes: Conjunctivae and EOM are normal. Pupils are equal, round, and reactive to light.  Neck: Normal range of motion.  Cardiovascular: Normal rate, regular rhythm and normal heart  sounds.   Pulmonary/Chest: Effort normal and breath sounds normal. No respiratory distress. She has no wheezes.  Abdominal: Soft. Bowel sounds are normal. There is tenderness in the left lower quadrant. There is no CVA tenderness, no tenderness at McBurney's point and negative Murphy's sign.  Abdomen soft, non-distended, TTP RLQ and LLQ  Musculoskeletal: Normal range of motion.  Left lumbar paraspinal TTP; no midline TTP, step-off, or deformity; full ROM maintained with some pain; strong distal pule and cap refill; normal gait  Neurological: She is alert and oriented to person, place, and time.  Skin: Skin is warm and dry. She is not diaphoretic.  Psychiatric: She has a normal mood and affect.    ED  Course  Procedures (including critical care time) Labs Review Labs Reviewed  CBC WITH DIFFERENTIAL - Abnormal; Notable for the following:    Neutrophils Relative % 25 (*)    Lymphocytes Relative 63 (*)    Lymphs Abs 5.0 (*)    All other components within normal limits  COMPREHENSIVE METABOLIC PANEL - Abnormal; Notable for the following:    Glucose, Bld 115 (*)    Total Bilirubin 0.2 (*)    All other components within normal limits  URINALYSIS, ROUTINE W REFLEX MICROSCOPIC - Abnormal; Notable for the following:    Bilirubin Urine SMALL (*)    All other components within normal limits   Imaging Review Ct Abdomen Pelvis W Contrast  09/20/2013   CLINICAL DATA:  Abdominal pain.  Left leg and groin pain.  EXAM: CT ABDOMEN AND PELVIS WITH CONTRAST  TECHNIQUE: Multidetector CT imaging of the abdomen and pelvis was performed using the standard protocol following bolus administration of intravenous contrast.  CONTRAST:  80mL OMNIPAQUE IOHEXOL 300 MG/ML  SOLN  COMPARISON:  07/21/2012 and 01/30/2007.  FINDINGS: Lung bases show no acute findings. Heart size normal. No pericardial effusion.  Liver and gallbladder are unremarkable. A 1.7 x 2.5 cm low-attenuation lesion in the right adrenal gland is unchanged from 01/30/2007. Left adrenal gland is unremarkable. A 1.5 cm low-attenuation lesion in the right kidney is unchanged from 07/21/2012 and statistically, likely represents a cyst. A sub cm low-attenuation lesion in the upper pole right kidney is too small to characterize but stable but also likely a cyst. Left kidney, spleen, pancreas, stomach and small bowel are unremarkable. Fair amount of stool is seen in the colon. Mild haziness is seen adjacent to the proximal descending colon (series 2, image 19). Scattered small peritoneal nodular densities measure up to 12 mm adjacent to the descending colon (series 2, image 26), as before.  Hysterectomy. Atherosclerotic calcification of the arterial vasculature  without abdominal aortic aneurysm. No pathologically enlarged lymph nodes. No free fluid. No worrisome lytic or sclerotic lesions.  IMPRESSION: 1. Mild haziness and stranding adjacent to the descending colon, possibly due to resolving diverticulitis. 2. Probable constipation. 3. Right adrenal adenoma.   Electronically Signed   By: Leanna Battles M.D.   On: 09/20/2013 09:14    EKG Interpretation   None       MDM   1. Diverticulitis    Labs as above.  CT revealing likely resolving diverticulitis and mild constipation-- no abscess formation or perforation noted.  Pt sx well controlled in the ED.  Left hip pain without numbness or weakness of extremity.  No signs/sx concerning for cauda equina. Pt afebrile, non-toxic appearing, NAD, VS stable- ok for discharge.  Rx zofran and percocet.  Instructed to continue taking abx as prescribed. Recommended colonscopy-- GI  referral provided. FU with PCP.  Discussed plan with pt, she agreed.  Return precautions advised.  Discussed with Dr. Blinda Leatherwood who agrees with assessment and plan of care.  Garlon Hatchet, PA-C 09/20/13 1146

## 2013-09-21 ENCOUNTER — Encounter (HOSPITAL_COMMUNITY): Payer: Self-pay | Admitting: Emergency Medicine

## 2013-09-21 ENCOUNTER — Inpatient Hospital Stay (HOSPITAL_COMMUNITY)
Admission: EM | Admit: 2013-09-21 | Discharge: 2013-09-23 | DRG: 392 | Disposition: A | Discharge status: Home / Self Care | Payer: Medicare Other | Attending: Internal Medicine | Admitting: Internal Medicine

## 2013-09-21 DIAGNOSIS — K5792 Diverticulitis of intestine, part unspecified, without perforation or abscess without bleeding: Secondary | ICD-10-CM

## 2013-09-21 DIAGNOSIS — M129 Arthropathy, unspecified: Secondary | ICD-10-CM | POA: Diagnosis present

## 2013-09-21 DIAGNOSIS — M25551 Pain in right hip: Secondary | ICD-10-CM

## 2013-09-21 DIAGNOSIS — K5900 Constipation, unspecified: Secondary | ICD-10-CM

## 2013-09-21 DIAGNOSIS — F17200 Nicotine dependence, unspecified, uncomplicated: Secondary | ICD-10-CM | POA: Diagnosis present

## 2013-09-21 DIAGNOSIS — Z981 Arthrodesis status: Secondary | ICD-10-CM

## 2013-09-21 DIAGNOSIS — F329 Major depressive disorder, single episode, unspecified: Secondary | ICD-10-CM | POA: Diagnosis present

## 2013-09-21 DIAGNOSIS — Z79899 Other long term (current) drug therapy: Secondary | ICD-10-CM

## 2013-09-21 DIAGNOSIS — Z8249 Family history of ischemic heart disease and other diseases of the circulatory system: Secondary | ICD-10-CM

## 2013-09-21 DIAGNOSIS — K5732 Diverticulitis of large intestine without perforation or abscess without bleeding: Principal | ICD-10-CM | POA: Diagnosis present

## 2013-09-21 DIAGNOSIS — E785 Hyperlipidemia, unspecified: Secondary | ICD-10-CM

## 2013-09-21 DIAGNOSIS — Z720 Tobacco use: Secondary | ICD-10-CM | POA: Diagnosis present

## 2013-09-21 DIAGNOSIS — Z833 Family history of diabetes mellitus: Secondary | ICD-10-CM

## 2013-09-21 DIAGNOSIS — Z889 Allergy status to unspecified drugs, medicaments and biological substances status: Secondary | ICD-10-CM

## 2013-09-21 DIAGNOSIS — R195 Other fecal abnormalities: Secondary | ICD-10-CM

## 2013-09-21 DIAGNOSIS — F3289 Other specified depressive episodes: Secondary | ICD-10-CM

## 2013-09-21 DIAGNOSIS — M353 Polymyalgia rheumatica: Secondary | ICD-10-CM

## 2013-09-21 LAB — CBC WITH DIFFERENTIAL/PLATELET
Eosinophils Relative: 3 % (ref 0–5)
HCT: 36.9 % (ref 36.0–46.0)
Lymphocytes Relative: 50 % — ABNORMAL HIGH (ref 12–46)
Lymphs Abs: 3.7 10*3/uL (ref 0.7–4.0)
MCV: 87.4 fL (ref 78.0–100.0)
Monocytes Absolute: 0.6 10*3/uL (ref 0.1–1.0)
Neutro Abs: 2.8 10*3/uL (ref 1.7–7.7)
Platelets: 345 10*3/uL (ref 150–400)
RBC: 4.22 MIL/uL (ref 3.87–5.11)
WBC: 7.3 10*3/uL (ref 4.0–10.5)

## 2013-09-21 LAB — URINALYSIS, ROUTINE W REFLEX MICROSCOPIC
Glucose, UA: NEGATIVE mg/dL
Leukocytes, UA: NEGATIVE
Protein, ur: NEGATIVE mg/dL
pH: 6 (ref 5.0–8.0)

## 2013-09-21 LAB — BASIC METABOLIC PANEL
CO2: 27 mEq/L (ref 19–32)
Calcium: 9.3 mg/dL (ref 8.4–10.5)
Chloride: 104 mEq/L (ref 96–112)
Glucose, Bld: 112 mg/dL — ABNORMAL HIGH (ref 70–99)
Sodium: 140 mEq/L (ref 135–145)

## 2013-09-21 LAB — URINE MICROSCOPIC-ADD ON

## 2013-09-21 MED ORDER — SODIUM CHLORIDE 0.9 % IV SOLN
INTRAVENOUS | Status: AC
  Administered 2013-09-21 (×2): via INTRAVENOUS

## 2013-09-21 MED ORDER — OXYCODONE HCL 5 MG PO TABS
5.0000 mg | ORAL_TABLET | ORAL | Status: DC | PRN
  Administered 2013-09-21 – 2013-09-22 (×2): 5 mg via ORAL
  Filled 2013-09-21 (×2): qty 1

## 2013-09-21 MED ORDER — ACETAMINOPHEN 650 MG RE SUPP: 650.0000 mg | Freq: Four times a day (QID) | RECTAL | Status: DC | PRN

## 2013-09-21 MED ORDER — ACETAMINOPHEN 325 MG PO TABS: 650.0000 mg | ORAL_TABLET | Freq: Four times a day (QID) | ORAL | Status: DC | PRN

## 2013-09-21 MED ORDER — ONDANSETRON HCL 4 MG/2ML IJ SOLN
4.0000 mg | Freq: Once | INTRAMUSCULAR | Status: AC
  Administered 2013-09-21: 4 mg via INTRAVENOUS
  Filled 2013-09-21: qty 2

## 2013-09-21 MED ORDER — MORPHINE SULFATE 4 MG/ML IJ SOLN
4.0000 mg | Freq: Once | INTRAMUSCULAR | Status: AC
  Administered 2013-09-21: 4 mg via INTRAVENOUS
  Filled 2013-09-21: qty 1

## 2013-09-21 MED ORDER — PSYLLIUM 95 % PO PACK
1.0000 | PACK | Freq: Every day | ORAL | Status: DC
  Administered 2013-09-21: 1 via ORAL
  Administered 2013-09-22: 11:00:00 via ORAL
  Administered 2013-09-23: 1 via ORAL
  Filled 2013-09-21 (×4): qty 1

## 2013-09-21 MED ORDER — SODIUM CHLORIDE 0.9 % IV SOLN
Freq: Once | INTRAVENOUS | Status: AC
  Administered 2013-09-21: 05:00:00 via INTRAVENOUS

## 2013-09-21 MED ORDER — ONDANSETRON 4 MG PO TBDP
4.0000 mg | ORAL_TABLET | Freq: Three times a day (TID) | ORAL | Status: DC | PRN
  Filled 2013-09-21: qty 1

## 2013-09-21 MED ORDER — PIPERACILLIN-TAZOBACTAM 3.375 G IVPB
3.3750 g | Freq: Three times a day (TID) | INTRAVENOUS | Status: DC
  Administered 2013-09-21 – 2013-09-23 (×8): 3.375 g via INTRAVENOUS
  Filled 2013-09-21 (×11): qty 50

## 2013-09-21 MED ORDER — MORPHINE SULFATE 2 MG/ML IJ SOLN
2.0000 mg | INTRAMUSCULAR | Status: DC | PRN
  Administered 2013-09-21: 2 mg via INTRAVENOUS
  Filled 2013-09-21: qty 1

## 2013-09-21 MED ORDER — SENNOSIDES-DOCUSATE SODIUM 8.6-50 MG PO TABS
1.0000 | ORAL_TABLET | Freq: Two times a day (BID) | ORAL | Status: DC
  Administered 2013-09-21 – 2013-09-23 (×5): 1 via ORAL
  Filled 2013-09-21 (×6): qty 1

## 2013-09-21 MED ORDER — ENOXAPARIN SODIUM 40 MG/0.4ML ~~LOC~~ SOLN
40.0000 mg | SUBCUTANEOUS | Status: DC
  Administered 2013-09-21: 40 mg via SUBCUTANEOUS
  Filled 2013-09-21 (×3): qty 0.4

## 2013-09-21 MED ORDER — PIPERACILLIN-TAZOBACTAM 4.5 G IVPB
4.5000 g | Freq: Once | INTRAVENOUS | Status: AC
  Administered 2013-09-21: 4.5 g via INTRAVENOUS
  Filled 2013-09-21: qty 100

## 2013-09-21 NOTE — ED Provider Notes (Signed)
CSN: [id number]     Arrival date & time 09/21/13  0129 History   First MD Initiated Contact with Patient 09/21/13 0407     Chief Complaint  Patient presents with  . Abdominal Pain   (Consider location/radiation/quality/duration/timing/severity/associated sxs/prior Treatment) HPI Comments: Pt with CT confirmed diverticulitis, on augmentin for the past few days comes in with cc of abd pain. Pt states that her pain started about 2 weeks ago, and her PCP started her on Augmentin. Despite taking her meds, she has had persistent abd pain, which had gotten worse. She reports pain being sharp, with intermittent flare ups (constant baseline pain), that shoots to her groin. She deneise any n/v/f/c currently, but admits to some emesis and bloody stools over the past few days.  Patient is a 57 y.o. female presenting with abdominal pain. The history is provided by the patient.  Abdominal Pain Associated symptoms: no chest pain, no constipation, no cough, no diarrhea, no dysuria, no fever, no hematuria, no nausea, no shortness of breath and no vomiting     Past Medical History  Diagnosis Date  . DEPRESSION   . HYPERLIPIDEMIA   . UTI'S, CHRONIC   . DISC DISEASE, CERVICAL   . NEPHROLITHIASIS, HX OF    Past Surgical History  Procedure Laterality Date  . Appendectomy  1981  . Abdominal hysterectomy  2001  . Left oophorectomy  2001  . Neck surgery  2009    fusion of C1, C2, C3  . Kidney stone surgery  2007  . Myomectomy  1981   Family History  Problem Relation Age of Onset  . Hypertension Mother   . Diabetes Mother   . Hyperlipidemia Mother   . Heart attack Father     MI  . Hyperlipidemia Father   . Hypertension Father   . Diabetes Father   . Heart disease Father   . Cancer Neg Hx     Negative for Breast or Colon Cancer   History  Substance Use Topics  . Smoking status: Current Some Day Smoker -- 0.50 packs/day for 30 years    Types: Cigarettes  . Smokeless tobacco: Never Used  .  Alcohol Use: No   OB History   Grav Para Term Preterm Abortions TAB SAB Ect Mult Living                 Review of Systems  Constitutional: Positive for activity change. Negative for fever.  HENT: Negative for facial swelling.   Respiratory: Negative for cough, shortness of breath and wheezing.   Cardiovascular: Negative for chest pain.  Gastrointestinal: Positive for abdominal pain and blood in stool. Negative for nausea, vomiting, diarrhea, constipation and abdominal distention.  Genitourinary: Negative for dysuria, hematuria and difficulty urinating.  Musculoskeletal: Negative for neck pain.  Skin: Negative for color change.  Neurological: Negative for speech difficulty.  Hematological: Does not bruise/bleed easily.  Psychiatric/Behavioral: Negative for confusion.    Allergies  Ciprofloxacin and Ofloxacin  Home Medications  No current outpatient prescriptions on file. BP 122/67  Pulse 67  Temp(Src) 98.5 F (36.9 C) (Oral)  Resp 12  Wt 154 lb 14.4 oz (70.262 kg)  SpO2 99% Physical Exam  Nursing note and vitals reviewed. Constitutional: She is oriented to person, place, and time. She appears well-developed and well-nourished.  HENT:  Head: Normocephalic and atraumatic.  Eyes: EOM are normal. Pupils are equal, round, and reactive to light.  Neck: Neck supple.  Cardiovascular: Normal rate, regular rhythm and normal heart sounds.  Pulmonary/Chest: Effort normal. No respiratory distress.  Abdominal: Soft. She exhibits no distension. There is tenderness. There is no rebound and no guarding.  Lower quadrant tenderness, worse in the LLQ with no rebound or guarding.  Neurological: She is alert and oriented to person, place, and time.  Skin: Skin is warm and dry.    ED Course  Procedures (including critical care time) Labs Review Labs Reviewed  CBC WITH DIFFERENTIAL - Abnormal; Notable for the following:    Neutrophils Relative % 38 (*)    Lymphocytes Relative 50 (*)     All other components within normal limits  BASIC METABOLIC PANEL - Abnormal; Notable for the following:    Glucose, Bld 112 (*)    All other components within normal limits  URINALYSIS, ROUTINE W REFLEX MICROSCOPIC - Abnormal; Notable for the following:    Hgb urine dipstick TRACE (*)    All other components within normal limits  URINE CULTURE  URINE MICROSCOPIC-ADD ON  CBC  BASIC METABOLIC PANEL   Imaging Review Ct Abdomen Pelvis W Contrast  09/20/2013   CLINICAL DATA:  Abdominal pain.  Left leg and groin pain.  EXAM: CT ABDOMEN AND PELVIS WITH CONTRAST  TECHNIQUE: Multidetector CT imaging of the abdomen and pelvis was performed using the standard protocol following bolus administration of intravenous contrast.  CONTRAST:  80mL OMNIPAQUE IOHEXOL 300 MG/ML  SOLN  COMPARISON:  07/21/2012 and 01/30/2007.  FINDINGS: Lung bases show no acute findings. Heart size normal. No pericardial effusion.  Liver and gallbladder are unremarkable. A 1.7 x 2.5 cm low-attenuation lesion in the right adrenal gland is unchanged from 01/30/2007. Left adrenal gland is unremarkable. A 1.5 cm low-attenuation lesion in the right kidney is unchanged from 07/21/2012 and statistically, likely represents a cyst. A sub cm low-attenuation lesion in the upper pole right kidney is too small to characterize but stable but also likely a cyst. Left kidney, spleen, pancreas, stomach and small bowel are unremarkable. Fair amount of stool is seen in the colon. Mild haziness is seen adjacent to the proximal descending colon (series 2, image 19). Scattered small peritoneal nodular densities measure up to 12 mm adjacent to the descending colon (series 2, image 26), as before.  Hysterectomy. Atherosclerotic calcification of the arterial vasculature without abdominal aortic aneurysm. No pathologically enlarged lymph nodes. No free fluid. No worrisome lytic or sclerotic lesions.  IMPRESSION: 1. Mild haziness and stranding adjacent to the  descending colon, possibly due to resolving diverticulitis. 2. Probable constipation. 3. Right adrenal adenoma.   Electronically Signed   By: Leanna Battles M.D.   On: 09/20/2013 09:14    EKG Interpretation    Date/Time:    Ventricular Rate:    PR Interval:    QRS Duration:   QT Interval:    QTC Calculation:   R Axis:     Text Interpretation:              MDM   1. Diverticulitis   2. Change in stool   3. Depressive disorder, not elsewhere classified   4. Other and unspecified hyperlipidemia   5. PMR (polymyalgia rheumatica)    Pt comes in w/ cc of LLQ abd pain. On augmentin for it. Despite being on AB, pain is persistent, and worse today. She is taking percocet at home with no significant relief. CT abd done just hours ago shows diverticulitis - no complication of it. Exam is consistent with diverticulitis. Will admit for persistent pain.   Derwood Kaplan, MD 09/21/13  2233 

## 2013-09-21 NOTE — H&P (Signed)
[facility] History and Physical  Lauren Orozco ZOX:[id number] DOB: Dec 02, 1955 DOA: 09/21/2013  Referring physician:  PCP: Illene Regulus, MD  Specialists:   Chief Complaint: Abdominal pain  HPI: Lauren Orozco is a 57 y.o. female  With a history of depression and hyperlipidemia with recently diagnosed diverticulitis that presents emergency with continued and persistent abdominal pain. Patient was seen by Dr. Alvera Novel was placed on Augmentin on 09/13/2013. Her abdominal pain had again has persisted which prompted her to physical ER twice. Patient also states that she has had constipation and does not record her last bowel movement occurred. She denies any recent illnesses. She also denies having a colonoscopy.   Review of Systems:  Constitutional: Denies fever, chills, diaphoresis, appetite change and fatigue.  HEENT: Denies photophobia, eye pain, redness, hearing loss, ear pain, congestion, sore throat, rhinorrhea, sneezing, mouth sores, trouble swallowing, neck pain, neck stiffness and tinnitus.   Respiratory: Denies SOB, DOE, cough, chest tightness,  and wheezing.   Cardiovascular: Denies chest pain, palpitations and leg swelling.  Gastrointestinal: Complains of abdominal pain and constipation. Genitourinary: Denies dysuria, urgency, frequency, hematuria, flank pain and difficulty urinating.  Musculoskeletal: Denies myalgias, back pain, joint swelling, arthralgias and gait problem.  Skin: Denies pallor, rash and wound.  Neurological: Denies dizziness, seizures, syncope, weakness, light-headedness, numbness and headaches.  Hematological: Denies adenopathy. Easy bruising, personal or family bleeding history  Psychiatric/Behavioral: Denies suicidal ideation, mood changes, confusion, nervousness, sleep disturbance and agitation  Past Medical History  Diagnosis Date  . DEPRESSION   . HYPERLIPIDEMIA   . UTI'S, CHRONIC   . DISC DISEASE, CERVICAL   .  NEPHROLITHIASIS, HX OF    Past Surgical History  Procedure Laterality Date  . Appendectomy  1981  . Abdominal hysterectomy  2001  . Left oophorectomy  2001  . Neck surgery  2009    fusion of C1, C2, C3  . Kidney stone surgery  2007  . Myomectomy  1981   Social History:  reports that she has been smoking Cigarettes.  She has a 15 pack-year smoking history. She has never used smokeless tobacco. She reports that she does not drink alcohol or use illicit drugs.   Allergies  Allergen Reactions  . Ciprofloxacin Hives    All floxacin drugs Burn-like hives  . Ofloxacin Hives    Burn like hives    Family History  Problem Relation Age of Onset  . Hypertension Mother   . Diabetes Mother   . Hyperlipidemia Mother   . Heart attack Father     MI  . Hyperlipidemia Father   . Hypertension Father   . Diabetes Father   . Heart disease Father   . Cancer Neg Hx     Negative for Breast or Colon Cancer   Prior to Admission medications   Medication Sig Start Date End Date Taking? Authorizing Provider  amoxicillin-clavulanate (AUGMENTIN) 875-125 MG per tablet Take 1 tablet by mouth 2 (two) times daily. 09/13/13  Yes Jacques Navy, MD  ondansetron (ZOFRAN ODT) 4 MG disintegrating tablet Take 1 tablet (4 mg total) by mouth every 8 (eight) hours as needed for nausea. 09/20/13  Yes Garlon Hatchet, PA-C  oxyCODONE-acetaminophen (PERCOCET/ROXICET) 5-325 MG per tablet Take 1 tablet by mouth every 4 (four) hours as needed. 09/20/13  Yes Garlon Hatchet, PA-C   Physical Exam: Filed Vitals:   09/21/13 0645  BP: 101/58  Pulse: 65  Temp:   Resp: 12     General: Well developed, well  nourished, NAD, appears stated age  HEENT: NCAT, PERRLA, EOMI, Anicteic Sclera, mucous membranes moist. No pharyngeal erythema or exudates  Neck: Supple, no JVD, no masses  Cardiovascular: S1 S2 auscultated, no rubs, murmurs or gallops. Regular rate and rhythm.  Respiratory: Clear to auscultation bilaterally  with equal chest rise  Abdomen: Soft, tender to palpation LLQ and RLQ, nondistended, + bowel sounds, no rebound or guarding  Extremities: warm dry without cyanosis clubbing or edema  Neuro: AAOx3, cranial nerves grossly intact. Strength 5/5 in patient's upper and lower extremities bilaterally  Skin: Without rashes exudates or nodules  Psych: Normal affect and demeanor with intact judgement and insight  Labs on Admission:  Basic Metabolic Panel:  Recent Labs Lab 09/20/13 0647 09/21/13 0520  NA 140 140  K 4.3 3.9  CL 103 104  CO2 26 27  GLUCOSE 115* 112*  BUN 13 13  CREATININE 0.76 0.72  CALCIUM 9.7 9.3   Liver Function Tests:  Recent Labs Lab 09/20/13 0647  AST 15  ALT 13  ALKPHOS 81  BILITOT 0.2*  PROT 7.8  ALBUMIN 4.0   No results found for this basename: LIPASE, AMYLASE,  in the last 168 hours No results found for this basename: AMMONIA,  in the last 168 hours CBC:  Recent Labs Lab 09/20/13 0647 09/21/13 0520  WBC 7.9 7.3  NEUTROABS 2.0 2.8  HGB 14.0 12.9  HCT 39.5 36.9  MCV 86.6 87.4  PLT 349 345   Cardiac Enzymes: No results found for this basename: CKTOTAL, CKMB, CKMBINDEX, TROPONINI,  in the last 168 hours  BNP (last 3 results) No results found for this basename: PROBNP,  in the last 8760 hours CBG: No results found for this basename: GLUCAP,  in the last 168 hours  Radiological Exams on Admission: Ct Abdomen Pelvis W Contrast  09/20/2013   CLINICAL DATA:  Abdominal pain.  Left leg and groin pain.  EXAM: CT ABDOMEN AND PELVIS WITH CONTRAST  TECHNIQUE: Multidetector CT imaging of the abdomen and pelvis was performed using the standard protocol following bolus administration of intravenous contrast.  CONTRAST:  80mL OMNIPAQUE IOHEXOL 300 MG/ML  SOLN  COMPARISON:  07/21/2012 and 01/30/2007.  FINDINGS: Lung bases show no acute findings. Heart size normal. No pericardial effusion.  Liver and gallbladder are unremarkable. A 1.7 x 2.5 cm  low-attenuation lesion in the right adrenal gland is unchanged from 01/30/2007. Left adrenal gland is unremarkable. A 1.5 cm low-attenuation lesion in the right kidney is unchanged from 07/21/2012 and statistically, likely represents a cyst. A sub cm low-attenuation lesion in the upper pole right kidney is too small to characterize but stable but also likely a cyst. Left kidney, spleen, pancreas, stomach and small bowel are unremarkable. Fair amount of stool is seen in the colon. Mild haziness is seen adjacent to the proximal descending colon (series 2, image 19). Scattered small peritoneal nodular densities measure up to 12 mm adjacent to the descending colon (series 2, image 26), as before.  Hysterectomy. Atherosclerotic calcification of the arterial vasculature without abdominal aortic aneurysm. No pathologically enlarged lymph nodes. No free fluid. No worrisome lytic or sclerotic lesions.  IMPRESSION: 1. Mild haziness and stranding adjacent to the descending colon, possibly due to resolving diverticulitis. 2. Probable constipation. 3. Right adrenal adenoma.   Electronically Signed   By: Leanna Battles M.D.   On: 09/20/2013 09:14    Assessment/Plan Active Problems:   HYPERLIPIDEMIA   DEPRESSION   Tobacco abuse   PMR (polymyalgia rheumatica)  Diverticulitis  Abdominal pain with diverticulitis This is a patient of Dr. Scarlett Presto. She was started on Augmentin 09/13/2013. She was found to have diverticulitis at that time however patient's pain continued to persist. Patient came to the emergency department twice at which point she was actually admitted. Patient will be admitted to the medical floor. She will be started on Zosyn IV as she does have a ciprofloxacin allergy.  CT of the abdomen shows mild haziness and stranding of the adjacent ascending colon possibly due to resolving diverticulitis.  Depression Patient states she's not on any medications for this.  Tobacco abuse Counseled patient on  tobacco and smoking cessation.  Polymyalgia rheumatica Patient states that she has been on medication in the past however is no longer taking medication.  Hyperlipidemia On no medications for her hyperlipidemia. She states she tries to watch her diet.  Constipation Will order stool softeners as well as Metamucil. Counseled patient on diet modification.  DVT prophylaxis: Lovenox  Code Status: Full  Condition: Guarded  Family Communication: Friend at bedside. Admission, patients condition and plan of care including tests being ordered have been discussed with the patient and friend who indicate understanding and agree with the plan and Code Status.  Disposition Plan: Admitted. Anticipated length of stay 2-3 midnights.  Time spent: 45 minutes  Shyasia Funches D.O. [facility] Pager [phone]  If 7PM-7AM, please contact night-coverage [email or web address] Password [facility] 09/21/2013, 7:49 AM

## 2013-09-21 NOTE — ED Notes (Signed)
Pt LLQ pain that radiates down left leg. States she was seen earlier today. Pain medications not working

## 2013-09-21 NOTE — Telephone Encounter (Signed)
To ED twice - currently, this AM, is back in the ED.

## 2013-09-21 NOTE — ED Notes (Signed)
Pt states pain is now a 9 out of 10

## 2013-09-21 NOTE — Progress Notes (Signed)
ANTIBIOTIC CONSULT NOTE - INITIAL  Pharmacy Consult for zosyn  Indication: diverticulitis  Allergies  Allergen Reactions  . Ciprofloxacin Hives    All floxacin drugs Burn-like hives  . Ofloxacin Hives    Burn like hives    Patient Measurements: Weight: 154 lb 14.4 oz (70.262 kg)  Vital Signs: Temp: 98.4 F (36.9 C) (12/18 0820) Temp src: Oral (12/18 0820) BP: 120/71 mmHg (12/18 0820) Pulse Rate: 73 (12/18 0820) Intake/Output from previous day: 12/17 0701 - 12/18 0700 In: 100 [I.V.:100] Out: -  Intake/Output from this shift:    Labs:  Recent Labs  09/20/13 0647 09/21/13 0520  WBC 7.9 7.3  HGB 14.0 12.9  PLT 349 345  CREATININE 0.76 0.72   The CrCl is unknown because both a height and weight (above a minimum accepted value) are required for this calculation. No results found for this basename: VANCOTROUGH, VANCOPEAK, VANCORANDOM, GENTTROUGH, GENTPEAK, GENTRANDOM, TOBRATROUGH, TOBRAPEAK, TOBRARND, AMIKACINPEAK, AMIKACINTROU, AMIKACIN,  in the last 72 hours   Microbiology: No results found for this or any previous visit (from the past 720 hour(s)).  Medical History: Past Medical History  Diagnosis Date  . DEPRESSION   . HYPERLIPIDEMIA   . UTI'S, CHRONIC   . DISC DISEASE, CERVICAL   . NEPHROLITHIASIS, HX OF    Assessment: 34 yoF with PMH of HLD, chronic UTIs, kidney stones presents to ED on 12/18 for abd pain. Pt came in 12/17 as well with same complaint but pain was well controlled in ED with CT revealing resolving diverticulitis which was started on augmentin initially on 12/10 (10 day course intended) with minimal improvement. She was sent home but is now back. Denies recent falls, injury, trauma. Cipro allergy  12/18 UCx>>  12/18 Zosyn>>  Plan:  Zosyn 3.375 g IV q8h Follow clinical status, renal function   Chalsea Darko B. Artelia Laroche, PharmD Clinical Pharmacist - Resident Pager: [phone] Phone: [phone] 09/21/2013 11:32 AM

## 2013-09-22 ENCOUNTER — Inpatient Hospital Stay (HOSPITAL_COMMUNITY): Payer: Medicare Other

## 2013-09-22 DIAGNOSIS — M25559 Pain in unspecified hip: Secondary | ICD-10-CM

## 2013-09-22 DIAGNOSIS — K5900 Constipation, unspecified: Secondary | ICD-10-CM

## 2013-09-22 LAB — BASIC METABOLIC PANEL
BUN: 8 mg/dL (ref 6–23)
CO2: 23 mEq/L (ref 19–32)
Calcium: 9.2 mg/dL (ref 8.4–10.5)
Chloride: 106 mEq/L (ref 96–112)
GFR calc Af Amer: >90 mL/min (ref >90)
Glucose, Bld: 104 mg/dL — ABNORMAL HIGH (ref 70–99)
Potassium: 3.9 mEq/L (ref 3.5–5.1)

## 2013-09-22 LAB — CBC
HCT: 38 % (ref 36.0–46.0)
Hemoglobin: 13.2 g/dL (ref 12.0–15.0)
RBC: 4.33 MIL/uL (ref 3.87–5.11)
WBC: 5.4 10*3/uL (ref 4.0–10.5)

## 2013-09-22 LAB — URINE CULTURE
Colony Count: NO GROWTH
Culture: NO GROWTH

## 2013-09-22 LAB — SEDIMENTATION RATE: Sed Rate: 12 mm/hr (ref 0–22)

## 2013-09-22 MED ORDER — POLYETHYLENE GLYCOL 3350 17 G PO PACK
17.0000 g | PACK | Freq: Every day | ORAL | Status: DC
Start: 2013-09-22 — End: 2013-09-22
  Administered 2013-09-22: 17 g via ORAL
  Filled 2013-09-22: qty 1

## 2013-09-22 MED ORDER — BISACODYL 10 MG RE SUPP
10.0000 mg | Freq: Once | RECTAL | Status: AC
  Administered 2013-09-22: 10 mg via RECTAL
  Filled 2013-09-22: qty 1

## 2013-09-22 MED ORDER — POLYETHYLENE GLYCOL 3350 17 G PO PACK
17.0000 g | PACK | Freq: Two times a day (BID) | ORAL | Status: DC
  Administered 2013-09-22 – 2013-09-23 (×2): 17 g via ORAL
  Filled 2013-09-22 (×3): qty 1

## 2013-09-22 MED ORDER — DULOXETINE HCL 30 MG PO CPEP
30.0000 mg | ORAL_CAPSULE | Freq: Every day | ORAL | Status: DC
  Administered 2013-09-23: 30 mg via ORAL
  Filled 2013-09-22 (×2): qty 1

## 2013-09-22 MED ORDER — ACETAMINOPHEN 500 MG PO TABS
1000.0000 mg | ORAL_TABLET | Freq: Three times a day (TID) | ORAL | Status: DC
  Administered 2013-09-22 – 2013-09-23 (×4): 1000 mg via ORAL
  Filled 2013-09-22 (×5): qty 2

## 2013-09-22 MED ORDER — MELOXICAM 15 MG PO TABS
15.0000 mg | ORAL_TABLET | Freq: Every day | ORAL | Status: DC
  Administered 2013-09-22 – 2013-09-23 (×2): 15 mg via ORAL
  Filled 2013-09-22 (×2): qty 1

## 2013-09-22 NOTE — ED Provider Notes (Signed)
Medical screening examination/treatment/procedure(s) were performed by non-physician practitioner and as supervising physician I was immediately available for consultation/collaboration.  EKG Interpretation   None          Christopher J. Pollina, MD 09/22/13 1359 

## 2013-09-22 NOTE — Progress Notes (Signed)
09/21/13 patient refuse duloxetine. [facility] RN.

## 2013-09-22 NOTE — Progress Notes (Signed)
Subjective: Lauren Orozco was seen in the office Dec 10th for abdominal pain and hematochezia, Clinically diagnosed with diverticulitis and started on Augmentin. Over the succeeding days she had worsening pain. She was seen in the ED x 2 where she was confirmed to have diverticulitis w/o abscess but due to her uncontrolled by has been admitted for further care.   [name] is c/o leg pain, more left than right. She also has continued pain in the lower abdomen with radiation to the lower back. She does appear to be in acute distress    Objective: Lab:  Recent Labs  09/20/13 0647 09/21/13 0520 09/22/13 0645  WBC 7.9 7.3 5.4  NEUTROABS 2.0 2.8  --   HGB 14.0 12.9 13.2  HCT 39.5 36.9 38.0  MCV 86.6 87.4 87.8  PLT 349 345 332    Recent Labs  09/20/13 0647 09/21/13 0520  NA 140 140  K 4.3 3.9  CL 103 104  GLUCOSE 115* 112*  BUN 13 13  CREATININE 0.76 0.72  CALCIUM 9.7 9.3    Imaging: 09/21/13 CT abd/pelvis: IMPRESSION:  1. Mild haziness and stranding adjacent to the descending colon,  possibly due to resolving diverticulitis.  2. Probable constipation.  3. Right adrenal adenoma.  Scheduled Meds: . enoxaparin (LOVENOX) injection  40 mg Subcutaneous Q24H  . piperacillin-tazobactam (ZOSYN)  IV  3.375 g Intravenous Q8H  . psyllium  1 packet Oral Daily  . senna-docusate  1 tablet Oral BID   Continuous Infusions:  PRN Meds:.acetaminophen, acetaminophen, morphine injection, ondansetron, oxyCODONE   Physical Exam: Filed Vitals:   09/22/13 0603  BP: 118/54  Pulse: 64  Temp: 98.3 F (36.8 C)  Resp: 16    Intake/Output Summary (Last 24 hours) at 09/22/13 0903 Last data filed at 09/22/13 1610  Gross per 24 hour  Intake    870 ml  Output      0 ml  Net    870 ml   Total this adm: +970  Gen'l  WNWD woman in no distress HEENT_ C&S clear Cor RRR Pulm - normal respirations Abd - BS+ x 4, no guarding or rebound, mild ot moderate tenderness but least at  the LLQ Ext - no deformity. Mild discomfort with internal and external rotation of the hip, L>R. No problems at the knee Neuro - A&O x 3, Sensation to light touch normal both legs, DTRs patellar tendons are brisk.        Assessment/Plan: 1. GI/ID - CT confirmation of resolving diverticulitis. WBC is normal and the patient is afebrile. No guarding or rebound and not really tender at the LLQ  PLan Continue zosyn with plan to change to oral antibiotics 12/20  Miralax 17 g daily for constipation  2. RTheum - patient with a h/o PMR but no particularly tender in muscle groups and no complaints of pain with shoulder or proximal LE movement  Plan ESR  3. MSK- h/o arthritis now c/o leg pain which on exam is located more in the hips than distal leg. Neurologically intact.   Plan - bilateral hip films.    Illene Regulus [facility] (o) [phone]; (c) [phone] Call-grp - [facility]  Tele: [phone]  09/22/2013, 7:34 AM

## 2013-09-22 NOTE — Progress Notes (Signed)
Utilization review completed.  

## 2013-09-23 MED ORDER — AMOXICILLIN-POT CLAVULANATE 875-125 MG PO TABS: 1.0000 | ORAL_TABLET | Freq: Two times a day (BID) | ORAL | Status: DC

## 2013-09-23 MED ORDER — POLYETHYLENE GLYCOL 3350 17 G PO PACK: 17.0000 g | PACK | Freq: Two times a day (BID) | ORAL | Status: DC | PRN

## 2013-09-23 NOTE — Discharge Summary (Signed)
Physician Discharge Summary  Patient ID: [name] MRN: [id number] DOB/AGE: May 31, 1956 57 y.o.  Admit date: 09/21/2013 Discharge date: 09/23/2013  Admission Diagnoses: Acute diverticulitis Hyperlipidemia Depression PMR   Discharge Diagnoses:  Active Problems: Diverticulitis   HYPERLIPIDEMIA   DEPRESSION   Tobacco abuse   PMR (polymyalgia rheumatica)      Discharged Condition: good  Hospital Course: The patient was admitted on December 18. She had presented to her primary physician's office on December 10 complaining of abdominal pain and was placed on Augmentin. She came to the hospital December 18 complaining of persistent left lower part for abdominal pain and constipation. A CT scan of the abdomen showed mild haziness stranding adjacent to the descending colon possibly due to resolving diverticulitis, probable constipation and right adrenal adenoma. The patient was put on IV Zosyn for 2 days. Her bowel pain improved. She was given Senokot and MiraLAX and had good results and felt better on the morning of discharge. She complained of some pain in her left leg and hips, bilateral hip films were negative. She has a history of polymyalgia rheumatica, sedimentation rate was normal at 12.  Consults: None  Significant Diagnostic Studies: labs: Sodium 140 potassium 3.9 chloride 106 bicarbonate 23 glucose 104 BUN 8 creatinine 0.78 and radiology: CT scan: As above  Treatments: IV hydration, antibiotics: Zosyn and analgesia: Oxycodone  Discharge Exam: Blood pressure 118/83, pulse 70, temperature 98.2 F (36.8 C), temperature source Oral, resp. rate 18, weight 70.262 kg (154 lb 14.4 oz), SpO2 100.00%. General appearance: alert and cooperative Resp: clear to auscultation bilaterally Cardio: regular rate and rhythm, S1, S2 normal, no murmur, click, rub or gallop GI: Mild left lower quadrant tenderness  Disposition: 01-Home or Self Care   Future Appointments Provider  Department Dept Phone   10/18/2013 10:15 AM Kandis Cocking, MD [facility], [place] [phone]       Medication List         amoxicillin-clavulanate 875-125 MG per tablet  Commonly known as:  AUGMENTIN  Take 1 tablet by mouth 2 (two) times daily.     ondansetron 4 MG disintegrating tablet  Commonly known as:  ZOFRAN ODT  Take 1 tablet (4 mg total) by mouth every 8 (eight) hours as needed for nausea.     oxyCODONE-acetaminophen 5-325 MG per tablet  Commonly known as:  PERCOCET/ROXICET  Take 1 tablet by mouth every 4 (four) hours as needed.     polyethylene glycol packet  Commonly known as:  MIRALAX / GLYCOLAX  Take 17 g by mouth 2 (two) times daily as needed for mild constipation.           Follow-up Information   Follow up with Illene Regulus, MD In 10 days.   Specialty:  Internal Medicine   Contact information:   [address] [place] [address] [phone]       Signed: Lillia Mountain 09/23/2013, 9:02 AM

## 2013-09-25 ENCOUNTER — Telehealth: Payer: Self-pay

## 2013-09-25 NOTE — Telephone Encounter (Signed)
Message copied by Newell Coral on Mon Sep 25, 2013  2:12 PM ------      Message from: Illene Regulus E      Created: Mon Sep 25, 2013  9:05 AM       hosp f/u visit Weds      thanks ------

## 2013-09-25 NOTE — Telephone Encounter (Signed)
Lvmom to call to make apt

## 2013-09-27 ENCOUNTER — Ambulatory Visit (INDEPENDENT_AMBULATORY_CARE_PROVIDER_SITE_OTHER): Payer: Medicare Other | Admitting: Surgery

## 2013-10-02 ENCOUNTER — Encounter: Payer: Self-pay | Admitting: Internal Medicine

## 2013-10-02 ENCOUNTER — Ambulatory Visit (INDEPENDENT_AMBULATORY_CARE_PROVIDER_SITE_OTHER): Payer: Medicare Other | Admitting: Internal Medicine

## 2013-10-02 VITALS — BP 130/90 | HR 82 | Temp 99.4°F | Wt 148.4 lb

## 2013-10-02 DIAGNOSIS — K5732 Diverticulitis of large intestine without perforation or abscess without bleeding: Secondary | ICD-10-CM

## 2013-10-02 DIAGNOSIS — K5900 Constipation, unspecified: Secondary | ICD-10-CM

## 2013-10-02 NOTE — Progress Notes (Signed)
   Subjective:    Patient ID: Lauren Orozco, female    DOB: Dec 31, 1955, 57 y.o.   MRN: [id number]  HPI Mrs. Parlee presents for hospital follow up. She was seen Dec 10th and diagnosed with diverticulitis. She did not do well and was seen twice in the ED being admitted the second time on Dec 18th. In hospital CT confirmed diverticulitis. She was continued on antibiotic therapy. She was also found on CT to be constipated and did well with Senekot and miralax. She was able to be d/c'd home Dec 20th on Augmentin and a bowel regimen.  Since being home she has done well. Resolution of abdominal pain LLQ. Her bowel habit has been regular on the regimen of senekot and miralax.   PMH, FamHx and SocHx reviewed for any changes and relevance. Current Outpatient Prescriptions on File Prior to Visit  Medication Sig Dispense Refill  . amoxicillin-clavulanate (AUGMENTIN) 875-125 MG per tablet Take 1 tablet by mouth 2 (two) times daily.  10 tablet  0  . ondansetron (ZOFRAN ODT) 4 MG disintegrating tablet Take 1 tablet (4 mg total) by mouth every 8 (eight) hours as needed for nausea.  10 tablet  0  . oxyCODONE-acetaminophen (PERCOCET/ROXICET) 5-325 MG per tablet Take 1 tablet by mouth every 4 (four) hours as needed.  10 tablet  0  . polyethylene glycol (MIRALAX / GLYCOLAX) packet Take 17 g by mouth 2 (two) times daily as needed for mild constipation.  14 each  0   No current facility-administered medications on file prior to visit.      Review of Systems System review is negative for any constitutional, cardiac, pulmonary, GI or neuro symptoms or complaints other than as described in the HPI.     Objective:   Physical Exam Filed Vitals:   10/02/13 1304  BP: 130/90  Pulse: 82  Temp: 99.4 F (37.4 C)   Wt Readings from Last 3 Encounters:  10/02/13 148 lb 6.4 oz (67.314 kg)  09/21/13 154 lb 14.4 oz (70.262 kg)  09/13/13 150 lb 12.8 oz (68.402 kg)   Gen'l - WNWD woman in no  distress HEENT- C&S clear Cor - RRR Pulm - CTAP Abd - BS+ x 4, soft, non-tender       Assessment & Plan:  1. Diverticulitis - resolved. Normal exam today.  2. Constipation - controlled on miralax and sennekot daily.

## 2013-10-02 NOTE — Patient Instructions (Signed)
Happy Holidays  You have made a good recovery from diverticulitis.  Plan Finish the antibiotics    For chronic constipation:  Plan Senekot at bedtime  Miralax (glycolax) in the AM

## 2013-10-02 NOTE — Progress Notes (Signed)
Pre visit review using our clinic review tool, if applicable. No additional management support is needed unless otherwise documented below in the visit note. 

## 2013-10-03 DIAGNOSIS — K5900 Constipation, unspecified: Secondary | ICD-10-CM | POA: Insufficient documentation

## 2013-10-18 ENCOUNTER — Ambulatory Visit (INDEPENDENT_AMBULATORY_CARE_PROVIDER_SITE_OTHER): Payer: Medicare Other | Admitting: Surgery

## 2013-10-18 VITALS — BP 126/64 | HR 76 | Temp 98.0°F | Resp 18 | Ht 66.0 in | Wt 152.0 lb

## 2013-10-18 DIAGNOSIS — L7682 Other postprocedural complications of skin and subcutaneous tissue: Secondary | ICD-10-CM

## 2013-10-18 DIAGNOSIS — R209 Unspecified disturbances of skin sensation: Secondary | ICD-10-CM

## 2013-10-18 NOTE — Progress Notes (Signed)
Re:   Lauren Orozco DOB:   Sep 26, 1956 MRN:   [id number]  ASSESSMENT AND PLAN: 1.  Diverticulitis  Hospitalized at [facility] 09/21/2014 - 09/23/2014 This episode responded well to antiobiotics  To get colonoscopy in about 3 months (she's never had one)  Return is PRN  2.  Nodular area in right aspect of pfannenstiel incision, but not hernia.  3.  Small umbilcal hernia. 4.  Smokes.  Trying to quit, but her BIL has been put on Hospice. 5.  Nephrolithiasis. 6.  Nausea and problems with BM, chronic this is better. 7.  Anxiety because her sister now has renal failure and is on dialysis. 8.  Disability secondary to neck pain.  Since 2009.  Chief Complaint  Patient presents with  . Routine Post Op    diverticulitis   REFERRING PHYSICIAN: Adella Hare, MD  HISTORY OF PRESENT ILLNESS: Lauren Orozco is a 58 y.o. (DOB: 1956/04/14)  AA female whose primary care physician is Adella Hare, MD and comes to me follow up for right abdominal/groin pain.  The pain I saw her for before is better.  But since I saw her, she was hospitalized at [facility] for 3 days in December 2014 for diverticulitis.  She has responded to antibiotics. She has a  Plan going forward of a colonoscopy about 3 to 4 months after this attack.  Her CT scan showed minimal disease.   She is taking Miralax to help with BM's - which appears to be working. She is stressed right now because her brother in law has multiple medical problems and is being managed by Hospice.  History of abdominal pain (October 2014): In the last month, she has been treated for a URI, which has resolved.  And she has gone to the ER for left wrist pain.  She comes with a splint on her left wrist. She said that she had this pain prior to her neck surgery in 2009 and the neck surgery was supposed to take care of the wrist pain.  She has not been back in touch with the surgeon who did her neck surgery.   She is continuing to have right groin  pain which radiates down her anterior right leg.  She said that her family is worried about her groin/right leg pain and that she lives by herself.  The pain does not seem to limit her activity.  The pain is unrelated to eating or bowel habits.  She is doing well with her bowels. She has tried heating pads on her abdomen with some symptomatic relief.   Past abdominal history: She had a surgery for an ovarian cyst (side?) and appendectomy in 1981 by Dr. Maryruth Eve. She had a hysterectomy for fibroids by Dr. Tempie Hoist in 2001.  She saw Dr. Adella Hare on 20 October 2011. Dr. Linda Hedges was concerned about a hard mass he could feel in her lower abdomen. The patient had no prior history of hernia disease or hernia surgery.    Past Medical History  Diagnosis Date  . DEPRESSION   . HYPERLIPIDEMIA   . UTI'S, CHRONIC   . DISC DISEASE, CERVICAL   . NEPHROLITHIASIS, HX OF      Current Outpatient Prescriptions  Medication Sig Dispense Refill  . polyethylene glycol (MIRALAX / GLYCOLAX) packet Take 17 g by mouth 2 (two) times daily as needed for mild constipation.  14 each  0  . amoxicillin-clavulanate (AUGMENTIN) 875-125 MG per tablet Take 1 tablet by mouth 2 (two) times  daily.  10 tablet  0  . ondansetron (ZOFRAN ODT) 4 MG disintegrating tablet Take 1 tablet (4 mg total) by mouth every 8 (eight) hours as needed for nausea.  10 tablet  0  . oxyCODONE-acetaminophen (PERCOCET/ROXICET) 5-325 MG per tablet Take 1 tablet by mouth every 4 (four) hours as needed.  10 tablet  0   No current facility-administered medications for this visit.    Allergies  Allergen Reactions  . Ciprofloxacin Hives    All floxacin drugs Burn-like hives  . Ofloxacin Hives    Burn like hives    REVIEW OF SYSTEMS: Pulmonary:  Smokes.  Unsuccessful at quitting. Endocrine:  No diabetes. No thyroid disease. Gastrointestinal:  See HPI. Urologic:  No history of kidney stones.  No history of bladder  infections. Musculoskeletal:  Chronic back pain.  Cervical spine surgery in 2009 by Dr. Sampson Goon at [facility].  On disability since then. Hematologic:  No bleeding disorder.  No history of anemia.  Not anticoagulated. Psycho-social:  The patient is oriented.   On Wellbutrin for anxiety/qutting smoking.  SOCIAL and FAMILY HISTORY: Single.  Lives by self. Has one daughter in college. On chronic disability.  PHYSICAL EXAM: BP 126/64  Pulse 76  Temp(Src) 98 F (36.7 C)  Resp 18  Ht 5\' 6"  (1.676 m)  Wt 152 lb (68.947 kg)  BMI 24.55 kg/m2  General: AA F who is alert and generally healthy appearing.  HEENT: Normal. Pupils equal.  Lungs: Clear to auscultation and symmetric breath sounds. Heart:  RRR. No murmur or rub. Abdomen: Small umbilical hernia, which is reducible.  Soft.  No localized tenderness or mass.  DATA REVIEWED: ER notes.  Alphonsa Overall, MD,  [facility], [address]  [place], [place]    [place] Phone:  [phone] FAX:  [phone]

## 2013-11-15 ENCOUNTER — Emergency Department (HOSPITAL_COMMUNITY)
Admission: EM | Admit: 2013-11-15 | Discharge: 2013-11-15 | Disposition: A | Discharge status: Home / Self Care | Payer: Medicare Other | Attending: Emergency Medicine | Admitting: Emergency Medicine

## 2013-11-15 ENCOUNTER — Emergency Department (HOSPITAL_COMMUNITY): Payer: Medicare Other

## 2013-11-15 ENCOUNTER — Encounter (HOSPITAL_COMMUNITY): Payer: Self-pay | Admitting: Emergency Medicine

## 2013-11-15 DIAGNOSIS — Y939 Activity, unspecified: Secondary | ICD-10-CM | POA: Insufficient documentation

## 2013-11-15 DIAGNOSIS — S0990XA Unspecified injury of head, initial encounter: Secondary | ICD-10-CM | POA: Insufficient documentation

## 2013-11-15 DIAGNOSIS — G8929 Other chronic pain: Secondary | ICD-10-CM | POA: Insufficient documentation

## 2013-11-15 DIAGNOSIS — W010XXA Fall on same level from slipping, tripping and stumbling without subsequent striking against object, initial encounter: Secondary | ICD-10-CM | POA: Insufficient documentation

## 2013-11-15 DIAGNOSIS — Z862 Personal history of diseases of the blood and blood-forming organs and certain disorders involving the immune mechanism: Secondary | ICD-10-CM | POA: Insufficient documentation

## 2013-11-15 DIAGNOSIS — S0993XA Unspecified injury of face, initial encounter: Secondary | ICD-10-CM | POA: Insufficient documentation

## 2013-11-15 DIAGNOSIS — Z8719 Personal history of other diseases of the digestive system: Secondary | ICD-10-CM | POA: Insufficient documentation

## 2013-11-15 DIAGNOSIS — M545 Low back pain, unspecified: Secondary | ICD-10-CM

## 2013-11-15 DIAGNOSIS — F17200 Nicotine dependence, unspecified, uncomplicated: Secondary | ICD-10-CM | POA: Insufficient documentation

## 2013-11-15 DIAGNOSIS — W19XXXA Unspecified fall, initial encounter: Secondary | ICD-10-CM

## 2013-11-15 DIAGNOSIS — Z87442 Personal history of urinary calculi: Secondary | ICD-10-CM | POA: Insufficient documentation

## 2013-11-15 DIAGNOSIS — Z8659 Personal history of other mental and behavioral disorders: Secondary | ICD-10-CM | POA: Insufficient documentation

## 2013-11-15 DIAGNOSIS — W108XXA Fall (on) (from) other stairs and steps, initial encounter: Secondary | ICD-10-CM | POA: Insufficient documentation

## 2013-11-15 DIAGNOSIS — Z87440 Personal history of urinary (tract) infections: Secondary | ICD-10-CM | POA: Insufficient documentation

## 2013-11-15 DIAGNOSIS — W1809XA Striking against other object with subsequent fall, initial encounter: Secondary | ICD-10-CM | POA: Insufficient documentation

## 2013-11-15 DIAGNOSIS — Z8639 Personal history of other endocrine, nutritional and metabolic disease: Secondary | ICD-10-CM | POA: Insufficient documentation

## 2013-11-15 DIAGNOSIS — Y929 Unspecified place or not applicable: Secondary | ICD-10-CM | POA: Insufficient documentation

## 2013-11-15 DIAGNOSIS — S199XXA Unspecified injury of neck, initial encounter: Secondary | ICD-10-CM

## 2013-11-15 DIAGNOSIS — T07XXXA Unspecified multiple injuries, initial encounter: Secondary | ICD-10-CM

## 2013-11-15 HISTORY — DX: Anxiety disorder, unspecified: F41.9

## 2013-11-15 HISTORY — DX: Cervicalgia: M54.2

## 2013-11-15 HISTORY — DX: Diverticulitis of intestine, part unspecified, without perforation or abscess without bleeding: K57.92

## 2013-11-15 HISTORY — DX: Other chronic pain: G89.29

## 2013-11-15 HISTORY — DX: Umbilical hernia without obstruction or gangrene: K42.9

## 2013-11-15 HISTORY — DX: Unspecified abdominal pain: R10.9

## 2013-11-15 HISTORY — DX: Pain in left wrist: M25.532

## 2013-11-15 MED ORDER — OXYCODONE-ACETAMINOPHEN 5-325 MG PO TABS
1.0000 | ORAL_TABLET | Freq: Once | ORAL | Status: AC
  Administered 2013-11-15: 1 via ORAL
  Filled 2013-11-15: qty 1

## 2013-11-15 MED ORDER — OXYCODONE-ACETAMINOPHEN 5-325 MG PO TABS: ORAL_TABLET | ORAL | Status: DC

## 2013-11-15 MED ORDER — METHOCARBAMOL 500 MG PO TABS: 1000.0000 mg | ORAL_TABLET | Freq: Four times a day (QID) | ORAL | Status: DC | PRN

## 2013-11-15 NOTE — Discharge Instructions (Signed)
°Emergency Department Resource Guide °1) Find a Doctor and Pay Out of Pocket °Although you won't have to find out who is covered by your insurance plan, it is a good idea to ask around and get recommendations. You will then need to call the office and see if the doctor you have chosen will accept you as a new patient and what types of options they offer for patients who are self-pay. Some doctors offer discounts or will set up payment plans for their patients who do not have insurance, but you will need to ask so you aren't surprised when you get to your appointment. ° °2) Contact Your Local Health Department °Not all health departments have doctors that can see patients for sick visits, but many do, so it is worth a call to see if yours does. If you don't know where your local health department is, you can check in your phone book. The CDC also has a tool to help you locate your state's health department, and many state websites also have listings of all of their local health departments. ° °3) Find a Walk-in Clinic °If your illness is not likely to be very severe or complicated, you may want to try a walk in clinic. These are popping up all over the country in pharmacies, drugstores, and shopping centers. They're usually staffed by nurse practitioners or physician assistants that have been trained to treat common illnesses and complaints. They're usually fairly quick and inexpensive. However, if you have serious medical issues or chronic medical problems, these are probably not your best option. ° °No Primary Care Doctor: °- Call Health Connect at  832-8000 - they can help you locate a primary care doctor that  accepts your insurance, provides certain services, etc. °- Physician Referral Service- 1-800-533-3463 ° °Chronic Pain Problems: °Organization         Address  Phone   Notes  °[facility]  (336) 297-2271 Patients need to be referred by their primary care doctor.  ° °Medication  Assistance: °Organization         Address  Phone   Notes  °[place] [facility] [address] °[place], [place] [address] (336) 641-8030 --Must be a resident of [place] °-- Must have NO insurance coverage whatsoever (no Medicaid/ Medicare, etc.) °-- The pt. MUST have a primary care doctor that directs their care regularly and follows them in the community °  °[facility]  (866) 331-1348   °[employer]  (888) 892-1162   ° °Agencies that provide inexpensive medical care: °Organization         Address  Phone   Notes  °[facility]  (336) 832-8035   °[facility]    (336) 832-7272   °[facility] [address] °[place], [place] [address] (336) 832-4777   °[facility] [address], °[place] (336) 271-4999   °[facility]    (336) 373-0678   °[facility]    (336) 272-1050   °[facility] ° [address], [place] Phone:  (336) 832-4444, Fax:  (336) 832-4440 Hours of Operation:  9 am - 6 pm, M-F.  Also accepts Medicaid/Medicare and self-pay.  °[facility] ° [address], [place] Phone: (336) 832-3150, Fax: (336) [phone]. Hours of Operation:  8:30 am - 5:30 pm, M-F.  Also accepts Medicaid and self-pay.  °[facility] [place] [address], [place] Phone: (336) 878-6027   °[facility] [address], [place], [place] (336)723-1848, Ext. 123 Mondays & Thursdays: 7-9 AM.  First 15 patients are seen on a first come, first serve basis. °  ° °Medicaid-accepting [place] Providers: ° °Organization         Address  Phone   Notes  °[facility] [address], [place] (336) 641-2100 Also accepts self-pay patients.  °[facility] [address], [place] ° (336) 856-9996   °[facility] [address], [place]  (336) 288-8857   °[facility] [address] [place] [address], [place] (336) 299-7000   °Veita Bland [address], [place]  ° (336) 373-1557 Only accepts [place] Access Medicaid patients after they have their name applied to their card.  ° °Self-Pay (no insurance) in [place]: ° °Organization         Address  Phone   Notes  °Sickle Cell Patients, [facility] [address], [place] (336) 832-1970   °[facility] [address], [place] (336) 832-4400   °[facility] ° [address], [place] (336) 992-4800   °[facility]/Dr. Osei-Bonsu ° [address] [place] [address], [place] or [address], [place] (336) 841-8500 Phone number for both [place] and [place] locations is the same.  °[facility] [address], [place] (336) 299-0000   °[facility] [address] [place] [address], [place] or [address] (336) 852-7530 °(336) 878-2260   °[facility] [address], [place] (336) 350-1642, phone; (336) 294-5005, fax Sees patients 1st and 3rd Saturday of every month.  Must not qualify for public or private insurance (i.e. Medicaid, Medicare, [place] Health Choice, Veterans' Benefits) • Household income should be no more than 200% of the poverty level •The clinic cannot treat you if you are pregnant or think you are pregnant • Sexually transmitted diseases are not treated at the clinic.  ° ° °Dental Care: °Organization         Address  Phone  Notes  °[place] [facility] [address], [place] (336) 641-6152 Accepts children up to age 21 who are enrolled in Medicaid or [place] Health Choice; pregnant women with a Medicaid card; and children who have applied for Medicaid or [place] Health Choice, but were declined, whose parents can pay a reduced fee at time of service.  °[place]  [facility] [place]  [address], [place] (336) 641-7733 Accepts children up to age 21 who are enrolled in Medicaid or [place] Health Choice; pregnant women with a Medicaid card; and children who have applied for Medicaid or [place] Health Choice, but were declined, whose parents can pay a reduced fee at time of service.  °[facility] ° [address], [place] (336) 641-4533 Patients are seen by appointment only. Walk-ins are not accepted. [facility] will see patients 18 years of age and older. °Monday - Tuesday (8am-5pm) °Most Wednesdays (8:30-5pm) °$30 per visit, cash only  °[facility] ° [address], [place] (336) 641-4533 Patients are seen by appointment only. Walk-ins are not accepted. [facility] will see patients 18 years of age and older. °One   Wednesday Evening (Monthly: Volunteer Based).  $30 per visit, cash only  °[facility]  (919) 537-3737 for adults; Children under age 4, call Graduate Pediatric Dentistry at ([phone]) [phone]. Children aged 4-14, please call (919) [phone] to request a pediatric application. ° Dental services are provided in all areas of dental care including fillings, crowns and bridges, complete and partial dentures, implants, gum treatment, root canals, and extractions. Preventive care is also provided. Treatment is provided to both adults and children. °Patients are selected via a lottery and there is often a waiting list. °  °[facility] [address], °[place] ° (336) 763-8833 [email or web address] °  °[facility] [address], [place], [place] (336)723-1848, Ext. 123 Second and Fourth Thursday of each month, opens at 6:30 AM; Clinic ends at 9 AM.  Patients are seen on a first-come first-served basis, and a limited number are seen during each clinic.  ° °[facility] ° [address], [place], [place] (336) 723-7904    Eligibility Requirements °You must have lived in [place], [place], or [place] counties for at least the last three months. °  You cannot be eligible for state or federal sponsored healthcare insurance, including Veterans Administration, Medicaid, or Medicare. °  You generally cannot be eligible for healthcare insurance through your employer.  °  How to apply: °Eligibility screenings are held every Tuesday and Wednesday afternoon from 1:00 pm until 4:00 pm. You do not need an appointment for the interview!  °[facility] [address], [place], [place] 336-631-2330   °[facility] [facility]  336-342-8273   °[facility]  336-703-3100   °[facility]  336-570-6415   ° °Behavioral Health Resources in the Community: °Intensive Outpatient Programs °Organization         Address  Phone  Notes  °[facility] [address], [place], [place] 336-878-6098   °[facility] [address], [place], [place] 336-832-9800   °[facility] [address], [place], [place] ° 336-882-2125   °[facility] [address],  °[place], [place] 1-800-853-5163 or 336-641-4981   °Substance Abuse Resources °Organization         Address  Phone  Notes  °[facility]  336-882-2125   °[facility]  336-784-9470   °[facility]  336-285-9073   °[facility]  336-845-3988   °Residential & Outpatient Substance Abuse Program  1-800-659-3381   °Psychological Services °Organization         Address  Phone  Notes  °[facility]  336- 832-9600   °[facility]  336- 378-7881   °[facility] [address], [place] 1-800-853-5163 or 336-641-4981   ° °Mobile Crisis Teams °Organization         Address  Phone  Notes  °[facility], Mobile Crisis Care Unit  1-877-626-1772   °Assertive °[facility] ° [address]  [place], [place] 336-834-9664   °Elias DeEsch [address] °[place] 336-554-5454   ° °Self-Help/Support Groups °Organization         Address  Phone             Notes  °[facility] - variety of support groups  336- 373-1402 Call for more information  °Narcotics Anonymous (NA), [facility] [address], °[place]  2 meetings at this location  ° °  Residential Treatment Programs °Organization         Address  Phone  Notes  °[facility] [address],    °[place]  1-866-801-8205   °[facility] ° [address], [place], [place] 704-293-8524   °[facility] [address], [place] 336-845-3988 Admissions: 8am-3pm M-F  °[facility] [address]    °[place], [place] 336-841-1104   °[facility] [address], [place], [place] 336-379-7146   °[facility] [address]  °[place], [place] 336-285-9073   °[facility] - Intensive Outpatient [address], [place], [place] 336-852-3033   °[facility] [address]  °[place], [place] 1-877-615-2722 or 336-784-9470   °[facility] [address] [place], [place] 336-227-7417 Accepts Medicaid  °[facility] [address]  °[place] 1-800-659-3381 Substance Abuse/Addiction Treatment  ° °[place] Behavioral Health Resources °Organization         Address  Phone  Notes  °[facility]  (888) 581-9988   °Julie Brannon, PhD [address] [place], [place]   (336) 349-5553 or (336) 951-0000   °[facility]   [address] °[place], [place] (336) 349-4454   °[facility] [address], [place], [place] (336) 342-8316 Insurance/Medicaid/sponsorship through [facility]  °[facility] [address]                                    [place], [place] (336) 342-8316 Therapy/tele-psych/case    °[facility] [address]  ° [place], [place] (336) 349-2233    °Dr. Arfeen  (336) 349-4544   °[facility] [place]  [employer] [place] [facility] 1) [address], [place] °2) [address], [place] °3)  [address], [place] (336) 349-3220 °(336) 342-7768 ° °(336) 342-8140   °[place] Child Abuse [facility] (336) 342-1394 or (336) 342-3537 (After Hours)    ° ° °Take the prescriptions as directed.  Apply moist heat or ice to the area(s) of discomfort, for 15 minutes at a time, several times per day for the next few days.  Do not fall asleep on a heating or ice pack.  Call your regular medical doctor today to schedule a follow up appointment in the next 2 days.  Return to the Emergency Department immediately if worsening. ° °

## 2013-11-15 NOTE — ED Notes (Signed)
Pt reports to the ED for eval of pain following a fall this morning. Pt reports neck, back, head, right arm, and buttock pain following the fall. Pt reports she fell down 5 wooden stairs. Pt reports she did hit her head but denies any LOC. Pt was ambulatory following the incident. CMS and full ROM intact in bilateral upper arms. Pt has hx of neck surgery in 2009 and is concerned about her neck. Pt reports her right arm is numb but reports this is not unusual for her. Denies any tingling, paralysis, or bowel or bladder changes. Pt A&Ox4, resp e/u, and skin warm and dry.

## 2013-11-15 NOTE — ED Provider Notes (Signed)
CSN: [id number]     Arrival date & time 11/15/13  0705 History   First MD Initiated Contact with Patient 11/15/13 0710     Chief Complaint  Patient presents with  . Fall      HPI Pt was seen at [place]. Per pt, c/o sudden onset and resolution of one episode of slip and fall down several steps this morning PTA. Pt states she fell onto her buttocks and hit her head. Pt c/o acute flair of her chronic low back and neck pain as well as head injury. Pain worsens with palpation of the area and body position changes. Pt was ambulatory immediately after and since the fall. Denies LOC, no AMS/confusion, no prodromal symptoms before fall, no CP/SOB, no abd pain, no N/V/D, no visual changes, no focal motor weakness, no tingling/numbness in extremities, no ataxia, no slurred speech, no facial droop. Denies incont/retention of bowel or bladder, no saddle anesthesia.     Past Medical History  Diagnosis Date  . DEPRESSION   . HYPERLIPIDEMIA   . UTI'S, CHRONIC   . DISC DISEASE, CERVICAL   . NEPHROLITHIASIS, HX OF   . Umbilical hernia   . Chronic neck pain   . Anxiety   . Diverticulitis   . Chronic pain of left wrist 2009    "since neck surgery"   Past Surgical History  Procedure Laterality Date  . Appendectomy  1981  . Abdominal hysterectomy  2001  . Left oophorectomy  2001  . Neck surgery  2009    fusion of C1, C2, C3  . Kidney stone surgery  2007  . Myomectomy  1981   Family History  Problem Relation Age of Onset  . Hypertension Mother   . Diabetes Mother   . Hyperlipidemia Mother   . Heart attack Father     MI  . Hyperlipidemia Father   . Hypertension Father   . Diabetes Father   . Heart disease Father   . Cancer Neg Hx     Negative for Breast or Colon Cancer   History  Substance Use Topics  . Smoking status: Current Some Day Smoker -- 0.50 packs/day for 30 years    Types: Cigarettes  . Smokeless tobacco: Never Used  . Alcohol Use: No    Review of Systems ROS: Statement:  All systems negative except as marked or noted in the HPI; Constitutional: Negative for fever and chills. ; ; Eyes: Negative for eye pain, redness and discharge. ; ; ENMT: Negative for ear pain, hoarseness, nasal congestion, sinus pressure and sore throat. ; ; Cardiovascular: Negative for chest pain, palpitations, diaphoresis, dyspnea and peripheral edema. ; ; Respiratory: Negative for cough, wheezing and stridor. ; ; Gastrointestinal: Negative for nausea, vomiting, diarrhea, abdominal pain, blood in stool, hematemesis, jaundice and rectal bleeding. . ; ; Genitourinary: Negative for dysuria, flank pain and hematuria. ; ; Musculoskeletal: +LBP, neck pain, head injury. Negative for swelling and deformity..; ; Skin: Negative for pruritus, rash, abrasions, blisters, bruising and skin lesion.; ; Neuro: Negative for headache, lightheadedness and neck stiffness. Negative for weakness, altered level of consciousness , altered mental status, extremity weakness, paresthesias, involuntary movement, seizure and syncope.      Allergies  Ciprofloxacin; Ofloxacin; and Other  Home Medications  No current outpatient prescriptions on file. BP 141/73  Pulse 76  Temp(Src) 98.5 F (36.9 C) (Oral)  Resp 18  SpO2 100% Physical Exam 0720: Physical examination: Vital signs and O2 SAT: Reviewed; Constitutional: Well developed, Well nourished, Well hydrated,  In no acute distress; Head and Face: Normocephalic, Atraumatic; Eyes: EOMI, PERRL, No scleral icterus; ENMT: Mouth and pharynx normal, Left TM normal, Right TM normal, Mucous membranes moist; Neck: Immobilized in C-collar, Trachea midline; Spine: No midline CS, TS, LS tenderness. +TTP bilat hypertonic trapezius muscles. +TTP bilat lower lumbar paraspinal muscles. No abrasions or ecchymosis.; Cardiovascular: Regular rate and rhythm, No gallop; Respiratory: Breath sounds clear & equal bilaterally, No wheezes, Normal respiratory effort/excursion; Chest: Nontender, No  deformity, Movement normal, No crepitus, No abrasions or ecchymosis.; Abdomen: Soft, Nontender, Nondistended, Normal bowel sounds, No abrasions or ecchymosis.; Genitourinary: No CVA tenderness;; Extremities: No deformity, Full range of motion major/large joints of bilat UE's and LE's without pain or tenderness to palp, Neurovascularly intact, Pulses normal, No tenderness, No ecchymosis bilat UE's. No edema, Pelvis stable; Neuro: AA&Ox3, GCS 15.  Major CN grossly intact. Speech clear. No gross focal motor or sensory deficits in extremities.; Skin: Color normal, Warm, Dry.   ED Course  Procedures    EKG Interpretation   None       MDM  MDM Reviewed: previous chart, nursing note and vitals Interpretation: CT scan and x-ray     Dg Chest 2 View 11/15/2013   CLINICAL DATA:  Fall, mid thoracic pain  EXAM: CHEST  2 VIEW  COMPARISON:  Prior chest x-ray 06/22/2010; concurrently obtained radiographs of the lumbar spine  FINDINGS: The lungs are clear and negative for focal airspace consolidation, pulmonary edema or suspicious pulmonary nodule. No pleural effusion or pneumothorax. Cardiac and mediastinal contours are within normal limits. Trace atherosclerotic calcification noted in the transverse aorta. No acute fracture or lytic or blastic osseous lesions. Incompletely evaluated cervical stabilization hardware. The visualized upper abdominal bowel gas pattern is unremarkable.  IMPRESSION: No active cardiopulmonary disease.   Electronically Signed   By: Jacqulynn Cadet M.D.   On: 11/15/2013 08:49   Dg Lumbar Spine Complete 11/15/2013   CLINICAL DATA:  Fall  EXAM: LUMBAR SPINE - COMPLETE 4+ VIEW  COMPARISON:  Concurrently obtained radiographs of the pelvis and thoracic spine  FINDINGS: There is no evidence of lumbar spine fracture. Alignment is normal. Intervertebral disc spaces are maintained. Minimal lower lumbar facet arthropathy at L4-L5 and L5-S1. Normal bony mineralization.  IMPRESSION: Negative.    Electronically Signed   By: Jacqulynn Cadet M.D.   On: 11/15/2013 08:47   Dg Sacrum/coccyx 11/15/2013   CLINICAL DATA:  Fall  EXAM: SACRUM AND COCCYX - 2+ VIEW  COMPARISON:  Concurrently obtained radiographs of the thoracic and lumbar spine ; prior radiographs of the pelvis and hips 09/22/2013  FINDINGS: There is no evidence of fracture or other focal bone lesions  IMPRESSION: Negative.   Electronically Signed   By: Jacqulynn Cadet M.D.   On: 11/15/2013 08:48   Ct Head Wo Contrast 11/15/2013   CLINICAL DATA:  Patient fell hitting the back of her head. Head and neck pain.  EXAM: CT HEAD WITHOUT CONTRAST  CT CERVICAL SPINE WITHOUT CONTRAST  TECHNIQUE: Multidetector CT imaging of the head and cervical spine was performed following the standard protocol without intravenous contrast. Multiplanar CT image reconstructions of the cervical spine were also generated.  COMPARISON:  Head CT, 05/07/2004.  Cervical CT, 08/29/2007  FINDINGS: CT HEAD FINDINGS  The ventricles are normal in size and configuration. There are no parenchymal masses or mass effect. There are no areas of abnormal parenchymal attenuation. No evidence of a cortical infarct.  There are no extra-axial masses or abnormal fluid collections.  There  is no intracranial hemorrhage.  No skull fracture. The visualized sinuses and mastoid air cells are clear.  CT CERVICAL SPINE FINDINGS  There is no fracture or spondylolisthesis.  There has been a along anterior cervical spine fusion from C3 through C7 with an anterior fusion plate, fixation screws and bone graft material all well-seated with no evidence of loosening or malalignment.  The central spinal canal and neural foramina are well preserved.  Unremarkable soft tissues.  Lung apices are clear.  IMPRESSION: HEAD CT:  NORMAL  CERVICAL CT: No fracture or acute finding. No evidence of loosening or malalignment of the orthopedic hardware.   Electronically Signed   By: Lajean Manes M.D.   On: 11/15/2013 08:23    Ct Cervical Spine Wo Contrast 11/15/2013   CLINICAL DATA:  Patient fell hitting the back of her head. Head and neck pain.  EXAM: CT HEAD WITHOUT CONTRAST  CT CERVICAL SPINE WITHOUT CONTRAST  TECHNIQUE: Multidetector CT imaging of the head and cervical spine was performed following the standard protocol without intravenous contrast. Multiplanar CT image reconstructions of the cervical spine were also generated.  COMPARISON:  Head CT, 05/07/2004.  Cervical CT, 08/29/2007  FINDINGS: CT HEAD FINDINGS  The ventricles are normal in size and configuration. There are no parenchymal masses or mass effect. There are no areas of abnormal parenchymal attenuation. No evidence of a cortical infarct.  There are no extra-axial masses or abnormal fluid collections.  There is no intracranial hemorrhage.  No skull fracture. The visualized sinuses and mastoid air cells are clear.  CT CERVICAL SPINE FINDINGS  There is no fracture or spondylolisthesis.  There has been a along anterior cervical spine fusion from C3 through C7 with an anterior fusion plate, fixation screws and bone graft material all well-seated with no evidence of loosening or malalignment.  The central spinal canal and neural foramina are well preserved.  Unremarkable soft tissues.  Lung apices are clear.  IMPRESSION: HEAD CT:  NORMAL  CERVICAL CT: No fracture or acute finding. No evidence of loosening or malalignment of the orthopedic hardware.   Electronically Signed   By: Lajean Manes M.D.   On: 11/15/2013 08:23    1000:  No midline CS tenderness, FROM CS without midline tenderness. No NMS changes. C-collar removed. XR/CT reassuring. Pt wants to go home now. Dx and testing d/w pt and family.  Questions answered.  Verb understanding, agreeable to d/c home with outpt f/u.    Alfonzo Feller, DO 11/17/13 [phone]

## 2013-11-27 ENCOUNTER — Encounter: Payer: Self-pay | Admitting: Internal Medicine

## 2013-11-27 ENCOUNTER — Ambulatory Visit (INDEPENDENT_AMBULATORY_CARE_PROVIDER_SITE_OTHER): Payer: Medicare Other | Admitting: Internal Medicine

## 2013-11-27 VITALS — BP 120/80 | HR 86 | Temp 99.6°F | Wt 151.0 lb

## 2013-11-27 DIAGNOSIS — F329 Major depressive disorder, single episode, unspecified: Secondary | ICD-10-CM

## 2013-11-27 DIAGNOSIS — S300XXA Contusion of lower back and pelvis, initial encounter: Secondary | ICD-10-CM | POA: Insufficient documentation

## 2013-11-27 DIAGNOSIS — F3289 Other specified depressive episodes: Secondary | ICD-10-CM

## 2013-11-27 MED ORDER — METHOCARBAMOL 500 MG PO TABS: 1000.0000 mg | ORAL_TABLET | Freq: Four times a day (QID) | ORAL | Status: DC | PRN

## 2013-11-27 NOTE — Assessment & Plan Note (Addendum)
Coccygeal contusion after fall. Continued pain and muscle spasm w/o focal neurologic deficit.  Plan Renewed robaxin 1,000 mg qid for muscle spasm.  NSIAD - otc ibuprofen 400 mg q 6 prn  Call for persistent pain.

## 2013-11-27 NOTE — Progress Notes (Signed)
Subjective:     Patient ID: Lauren Orozco, female   DOB: September 28, 1956, 58 y.o.   MRN: [id number]  HPI  [place] down the stairs at 5:30am Feb 11th. She went to  [facility] ED and was seen and evaluated- notes and studies reviewed: CT scan head and cervical spine and X-ray chest, lumbar spine/coccyx w/o fracture or major injury. Was given percocet and robaxin.She has been using epsom salt and bathwater twice a day. Has to stay on heating pad. Coccygeal pain is still 8-9/10 at night, less severe during the day. Pain does interfere with sleep. Pain worsens with palpation of the area and body position changes. Pt was ambulatory immediately after and since the fall. Denies LOC, no AMS/confusion, no prodromal symptoms prior to fall. No CP. Shortness of breath +/- rib discomfort. No new abd pain, no N/V/D. No tingling/numbness in extremities, Denies incont/retention of bowel or bladder, no saddle anesthesia. She does report relief with anti-spasm medication and has not been taking Percocet.    Past Medical History  Diagnosis Date  . DEPRESSION   . HYPERLIPIDEMIA   . UTI'S, CHRONIC   . DISC DISEASE, CERVICAL   . NEPHROLITHIASIS, HX OF   . Umbilical hernia   . Chronic neck pain     with radiculopathy  . Anxiety   . Diverticulitis   . Chronic pain of left wrist 2009    "since neck surgery"  . Recurrent abdominal pain    Past Surgical History  Procedure Laterality Date  . Appendectomy  1981  . Abdominal hysterectomy  2001  . Left oophorectomy  2001  . Neck surgery  2009    fusion of C1, C2, C3  . Kidney stone surgery  2007  . Myomectomy  1981   Family History  Problem Relation Age of Onset  . Hypertension Mother   . Diabetes Mother   . Hyperlipidemia Mother   . Heart attack Father     MI  . Hyperlipidemia Father   . Hypertension Father   . Diabetes Father   . Heart disease Father   . Cancer Neg Hx     Negative for Breast or Colon Cancer   History   Social History  . Marital  Status: Single    Spouse Name: N/A    Number of Children: 1  . Years of Education: HSG   Occupational History  . Disabled    Social History Main Topics  . Smoking status: Current Some Day Smoker -- 0.50 packs/day for 30 years    Types: Cigarettes  . Smokeless tobacco: Never Used  . Alcohol Use: No  . Drug Use: No  . Sexual Activity: Not Currently   Other Topics Concern  . Not on file   Social History Narrative   [employer], [employer]-CNA (in process). [place]. 1 dtr-'91 [place] college at [employer] (March '12). Work-disability due to neck and arthritis problems. Lives-alone. No history of physical or sexual abuse    Review of Systems  System review is negative for any constitutional, cardiac, pulmonary, GI or neuro symptoms or complaints other than as described in the HPI.     Objective:   Physical Exam  Filed Vitals:   11/27/13 1642  BP: 120/80  Pulse: 86  Temp: 99.6 F (37.6 C)   Filed Weights   11/27/13 1642  Weight: 151 lb (68.493 kg)   General: Well developed, well nourished, NAD, appears stated age HEENT: NCAT, PERRLA, EOMI, Anicteic Sclera, mucous membranes moist.  Neck: Supple, no JVD, no  masses  Cardiovascular: S1 S2 auscultated, no rubs, murmurs or gallops. Regular rate and rhythm.  Respiratory: Clear to auscultation bilaterally with equal chest rise  Abdomen: Soft, nontender, nondistended, + bowel sounds  Extremities: warm, dry without cyanosis, clubbing, or edema  Neuro: Alert and Oriented x3, cranial nerves II-XII grossly intact.  Skin: Without rashes, exudates, or nodules  Psych: Normal mood and affect with intact judgement and insight Back exam: normal stand; normal flex to greater than 100 degrees; normal gait; normal toe/heel walk; normal step up to exam table; normal SLR sitting; normal DTRs at the patellar tendons; normal sensation to light touch, pin-prick and deep vibratory stimulus; no  CVA tenderness; able to move supine to sitting witout  assistance.  Current Outpatient Prescriptions on File Prior to Visit  Medication Sig Dispense Refill  . methocarbamol (ROBAXIN) 500 MG tablet Take 2 tablets (1,000 mg total) by mouth 4 (four) times daily as needed for muscle spasms (muscle spasm/pain).  25 tablet  0  . oxyCODONE-acetaminophen (PERCOCET/ROXICET) 5-325 MG per tablet 1 or 2 tabs PO q6h prn pain  20 tablet  0   No current facility-administered medications on file prior to visit.       Assessment and Plan:

## 2013-11-27 NOTE — Patient Instructions (Addendum)
  1) Back pain - It seems like you fell right on your tailbone.  - Continue alternating ice and heat - Continue epsom salts as tolerated - We will start Robaxin - take two pills four times a day for muscle spasm - Take ibuprofen- two pills (400mg ) up to four times a day as needed for pain.     2) Retirement - I am retiring at the end of March - At that point, Dr. Ronnald Ramp will take over your care.   Tailbone Injury The tailbone (coccyx) is the small bone at the lower end of the spine. A tailbone injury may involve stretched ligaments, bruising, or a broken bone (fracture). Women are more vulnerable to this injury due to having a wider pelvis. CAUSES  This type of injury typically occurs from falling and landing on the tailbone. Repeated strain or friction from actions such as rowing and bicycling may also injure the area. The tailbone can be injured during childbirth. Infections or tumors may also press on the tailbone and cause pain. Sometimes, the cause of injury is unknown. SYMPTOMS   Bruising.  Pain when sitting.  Painful bowel movements.  In women, pain during intercourse. DIAGNOSIS  Your caregiver can diagnose a tailbone injury based on your symptoms and a physical exam. X-rays may be taken if a fracture is suspected. Your caregiver may also use an MRI scan imaging test to evaluate your symptoms. TREATMENT  Your caregiver may prescribe medicines to help relieve your pain. Most tailbone injuries heal on their own in 4 to 6 weeks. However, if the injury is caused by an infection or tumor, the recovery period may vary. PREVENTION  Wear appropriate padding and sports gear when bicycling and rowing. This can help prevent an injury from repeated strain or friction. HOME CARE INSTRUCTIONS   Put ice on the injured area.  Put ice in a plastic bag.  Place a towel between your skin and the bag.  Leave the ice on for 15-20 minutes, every hour while awake for the first 1 to 2  days.  Sit on a large, rubber or inflated ring or cushion to ease your pain. Lean forward when sitting to help decrease discomfort.  Avoid sitting for long periods of time.  Increase your activity as the pain allows.  Only take over-the-counter or prescription medicines for pain, discomfort, or fever as directed by your caregiver.  You may use stool softeners if it is painful to have a bowel movement, or as directed by your caregiver.  Eat a diet with plenty of fiber to help prevent constipation.  Keep all follow-up appointments as directed by your caregiver. SEEK MEDICAL CARE IF:   Your pain becomes worse.  Your bowel movements cause a great deal of discomfort.  You are unable to have a bowel movement.  You have a fever. MAKE SURE YOU:  Understand these instructions.  Will watch your condition.  Will get help right away if you are not doing well or get worse. Document Released: 09/18/2000 Document Revised: 12/14/2011 Document Reviewed: 04/16/2011 [facility] Patient Information 2014 [place], [place].

## 2013-11-27 NOTE — Assessment & Plan Note (Signed)
Patient reports a much improved mood. Not currently on medication and not currently depressed.

## 2013-11-27 NOTE — Progress Notes (Signed)
Pre visit review using our clinic review tool, if applicable. No additional management support is needed unless otherwise documented below in the visit note. 

## 2014-03-05 ENCOUNTER — Encounter (HOSPITAL_COMMUNITY): Payer: Self-pay | Admitting: Emergency Medicine

## 2014-03-05 ENCOUNTER — Emergency Department (HOSPITAL_COMMUNITY): Payer: Medicare Other

## 2014-03-05 ENCOUNTER — Emergency Department (HOSPITAL_COMMUNITY)
Admission: EM | Admit: 2014-03-05 | Discharge: 2014-03-05 | Disposition: A | Discharge status: Home / Self Care | Payer: Medicare Other | Attending: Emergency Medicine | Admitting: Emergency Medicine

## 2014-03-05 ENCOUNTER — Ambulatory Visit: Payer: Medicare Other | Admitting: Internal Medicine

## 2014-03-05 DIAGNOSIS — R059 Cough, unspecified: Secondary | ICD-10-CM | POA: Insufficient documentation

## 2014-03-05 DIAGNOSIS — R5381 Other malaise: Secondary | ICD-10-CM | POA: Insufficient documentation

## 2014-03-05 DIAGNOSIS — Z8719 Personal history of other diseases of the digestive system: Secondary | ICD-10-CM | POA: Insufficient documentation

## 2014-03-05 DIAGNOSIS — R079 Chest pain, unspecified: Secondary | ICD-10-CM | POA: Insufficient documentation

## 2014-03-05 DIAGNOSIS — Z862 Personal history of diseases of the blood and blood-forming organs and certain disorders involving the immune mechanism: Secondary | ICD-10-CM | POA: Insufficient documentation

## 2014-03-05 DIAGNOSIS — Z87440 Personal history of urinary (tract) infections: Secondary | ICD-10-CM | POA: Insufficient documentation

## 2014-03-05 DIAGNOSIS — Z8639 Personal history of other endocrine, nutritional and metabolic disease: Secondary | ICD-10-CM | POA: Insufficient documentation

## 2014-03-05 DIAGNOSIS — R4182 Altered mental status, unspecified: Secondary | ICD-10-CM | POA: Insufficient documentation

## 2014-03-05 DIAGNOSIS — Z8659 Personal history of other mental and behavioral disorders: Secondary | ICD-10-CM | POA: Insufficient documentation

## 2014-03-05 DIAGNOSIS — Z87442 Personal history of urinary calculi: Secondary | ICD-10-CM | POA: Insufficient documentation

## 2014-03-05 DIAGNOSIS — Z792 Long term (current) use of antibiotics: Secondary | ICD-10-CM | POA: Insufficient documentation

## 2014-03-05 DIAGNOSIS — Z87891 Personal history of nicotine dependence: Secondary | ICD-10-CM | POA: Insufficient documentation

## 2014-03-05 DIAGNOSIS — G8929 Other chronic pain: Secondary | ICD-10-CM | POA: Insufficient documentation

## 2014-03-05 DIAGNOSIS — R509 Fever, unspecified: Secondary | ICD-10-CM

## 2014-03-05 DIAGNOSIS — R05 Cough: Secondary | ICD-10-CM | POA: Insufficient documentation

## 2014-03-05 DIAGNOSIS — Z8739 Personal history of other diseases of the musculoskeletal system and connective tissue: Secondary | ICD-10-CM | POA: Insufficient documentation

## 2014-03-05 DIAGNOSIS — R0602 Shortness of breath: Secondary | ICD-10-CM | POA: Insufficient documentation

## 2014-03-05 DIAGNOSIS — R058 Other specified cough: Secondary | ICD-10-CM

## 2014-03-05 DIAGNOSIS — R5383 Other fatigue: Secondary | ICD-10-CM

## 2014-03-05 DIAGNOSIS — R52 Pain, unspecified: Secondary | ICD-10-CM | POA: Insufficient documentation

## 2014-03-05 DIAGNOSIS — IMO0002 Reserved for concepts with insufficient information to code with codable children: Secondary | ICD-10-CM | POA: Insufficient documentation

## 2014-03-05 LAB — BASIC METABOLIC PANEL
BUN: 8 mg/dL (ref 6–23)
CO2: 23 mEq/L (ref 19–32)
Calcium: 10.1 mg/dL (ref 8.4–10.5)
Chloride: 96 mEq/L (ref 96–112)
Creatinine, Ser: 0.64 mg/dL (ref 0.50–1.10)
GFR calc Af Amer: >90 mL/min (ref >90)
GFR calc non Af Amer: >90 mL/min (ref >90)
Glucose, Bld: 101 mg/dL — ABNORMAL HIGH (ref 70–99)
Potassium: 3.8 mEq/L (ref 3.7–5.3)
Sodium: 135 mEq/L — ABNORMAL LOW (ref 137–147)

## 2014-03-05 LAB — CBC WITH DIFFERENTIAL/PLATELET
Basophils Absolute: 0 10*3/uL (ref 0.0–0.1)
Basophils Relative: 0 % (ref 0–1)
Eosinophils Absolute: 0.1 10*3/uL (ref 0.0–0.7)
Eosinophils Relative: 1 % (ref 0–5)
HCT: 37.7 % (ref 36.0–46.0)
Hemoglobin: 13.1 g/dL (ref 12.0–15.0)
Lymphocytes Relative: 28 % (ref 12–46)
Lymphs Abs: 2.4 10*3/uL (ref 0.7–4.0)
MCH: 30 pg (ref 26.0–34.0)
MCHC: 34.7 g/dL (ref 30.0–36.0)
MCV: 86.3 fL (ref 78.0–100.0)
Monocytes Absolute: 0.6 10*3/uL (ref 0.1–1.0)
Monocytes Relative: 7 % (ref 3–12)
Neutro Abs: 5.5 10*3/uL (ref 1.7–7.7)
Neutrophils Relative %: 64 % (ref 43–77)
Platelets: 419 10*3/uL — ABNORMAL HIGH (ref 150–400)
RBC: 4.37 MIL/uL (ref 3.87–5.11)
RDW: 13 % (ref 11.5–15.5)
WBC: 8.6 10*3/uL (ref 4.0–10.5)

## 2014-03-05 LAB — URINE MICROSCOPIC-ADD ON

## 2014-03-05 LAB — URINALYSIS, ROUTINE W REFLEX MICROSCOPIC
Bilirubin Urine: NEGATIVE
Glucose, UA: NEGATIVE mg/dL
Ketones, ur: NEGATIVE mg/dL
Leukocytes, UA: NEGATIVE
Nitrite: NEGATIVE
Protein, ur: NEGATIVE mg/dL
Specific Gravity, Urine: 1.008 (ref 1.005–1.030)
Urobilinogen, UA: 0.2 mg/dL (ref 0.0–1.0)
pH: 7 (ref 5.0–8.0)

## 2014-03-05 MED ORDER — SODIUM CHLORIDE 0.9 % IV BOLUS (SEPSIS)
1000.0000 mL | Freq: Once | INTRAVENOUS | Status: AC
  Administered 2014-03-05: 1000 mL via INTRAVENOUS

## 2014-03-05 MED ORDER — DEXTROSE 5 % IV SOLN
1.0000 g | Freq: Once | INTRAVENOUS | Status: AC
  Administered 2014-03-05: 1 g via INTRAVENOUS
  Filled 2014-03-05: qty 10

## 2014-03-05 MED ORDER — GUAIFENESIN-CODEINE 100-10 MG/5ML PO SOLN: 10.0000 mL | Freq: Three times a day (TID) | ORAL | Status: DC | PRN

## 2014-03-05 MED ORDER — DEXTROSE 5 % IV SOLN
500.0000 mg | Freq: Once | INTRAVENOUS | Status: AC
  Administered 2014-03-05: 500 mg via INTRAVENOUS

## 2014-03-05 MED ORDER — AZITHROMYCIN 250 MG PO TABS: 250.0000 mg | ORAL_TABLET | Freq: Every day | ORAL | Status: DC

## 2014-03-05 MED ORDER — GUAIFENESIN-CODEINE 100-10 MG/5ML PO SOLN
10.0000 mL | Freq: Once | ORAL | Status: AC
  Administered 2014-03-05: 10 mL via ORAL
  Filled 2014-03-05: qty 10

## 2014-03-05 MED ORDER — KETOROLAC TROMETHAMINE 15 MG/ML IJ SOLN
15.0000 mg | Freq: Once | INTRAMUSCULAR | Status: AC
  Administered 2014-03-05: 15 mg via INTRAVENOUS
  Filled 2014-03-05: qty 1

## 2014-03-05 NOTE — ED Notes (Signed)
Patient transported to X-ray 

## 2014-03-05 NOTE — ED Notes (Signed)
Pt states that she has had a cough since last night and has had pain in her chest. Brown sputum. Hx of pneumonia. Alert and oriented.

## 2014-03-05 NOTE — ED Notes (Signed)
Patient upset about wait. She wanted to know who was attending to care. I explained that we are working on her case. She will be up for x-ray in approximately 15 minutes and that the attending will see her once x-ray has resulted. I have been in the patient's room 3 times since she was brought back. Assessment complete. Patient is in no distress.

## 2014-03-05 NOTE — ED Notes (Signed)
MD at bedside. 

## 2014-03-07 ENCOUNTER — Ambulatory Visit (INDEPENDENT_AMBULATORY_CARE_PROVIDER_SITE_OTHER): Payer: Medicare Other | Admitting: Internal Medicine

## 2014-03-07 ENCOUNTER — Encounter: Payer: Self-pay | Admitting: Internal Medicine

## 2014-03-07 VITALS — BP 138/88 | HR 87 | Temp 98.6°F | Wt 149.2 lb

## 2014-03-07 DIAGNOSIS — J209 Acute bronchitis, unspecified: Secondary | ICD-10-CM | POA: Insufficient documentation

## 2014-03-07 DIAGNOSIS — R062 Wheezing: Secondary | ICD-10-CM

## 2014-03-07 DIAGNOSIS — F17200 Nicotine dependence, unspecified, uncomplicated: Secondary | ICD-10-CM

## 2014-03-07 DIAGNOSIS — Z720 Tobacco use: Secondary | ICD-10-CM

## 2014-03-07 MED ORDER — PREDNISONE 10 MG PO TABS: ORAL_TABLET | ORAL | Status: DC

## 2014-03-07 MED ORDER — ALBUTEROL SULFATE HFA 108 (90 BASE) MCG/ACT IN AERS: 2.0000 | INHALATION_SPRAY | Freq: Four times a day (QID) | RESPIRATORY_TRACT | Status: DC | PRN

## 2014-03-07 NOTE — Assessment & Plan Note (Signed)
Mild to mod, for antibx course,  to f/u any worsening symptoms or concerns 

## 2014-03-07 NOTE — Assessment & Plan Note (Signed)
Mild to mod, for predpack asd,  to f/u any worsening symptoms or concerns 

## 2014-03-07 NOTE — Assessment & Plan Note (Signed)
Urged to quit 

## 2014-03-07 NOTE — Progress Notes (Signed)
Subjective:    Patient ID: Lauren Orozco, female    DOB: 1956/05/17, 58 y.o.   MRN: [id number]  HPI  Here with acute onset mild to mod 2-3 days ST, HA, general weakness and malaise, with prod cough greenish sputum, but Pt denies chest pain, increased sob or doe, wheezing, orthopnea, PND, increased LE swelling, palpitations, dizziness or syncope, except for onset mild wheezing last night.   Pt denies wt loss, night sweats, loss of appetite, or other constitutional symptoms.  Has sharp fleeting pains to diffuse chest and back with cough.   Past Medical History  Diagnosis Date  . DEPRESSION   . HYPERLIPIDEMIA   . UTI'S, CHRONIC   . DISC DISEASE, CERVICAL   . NEPHROLITHIASIS, HX OF   . Umbilical hernia   . Chronic neck pain     with radiculopathy  . Anxiety   . Diverticulitis   . Chronic pain of left wrist 2009    "since neck surgery"  . Recurrent abdominal pain    Past Surgical History  Procedure Laterality Date  . Appendectomy  1981  . Abdominal hysterectomy  2001  . Left oophorectomy  2001  . Neck surgery  2009    fusion of C1, C2, C3  . Kidney stone surgery  2007  . Myomectomy  1981    reports that she quit smoking about 3 months ago. Her smoking use included Cigarettes. She has a 15 pack-year smoking history. She has never used smokeless tobacco. She reports that she does not drink alcohol or use illicit drugs. family history includes Diabetes in her father and mother; Heart attack in her father; Heart disease in her father; Hyperlipidemia in her father and mother; Hypertension in her father and mother. There is no history of Cancer. Allergies  Allergen Reactions  . Ciprofloxacin Hives    All floxacin drugs Burn-like hives  . Ofloxacin Hives    Burn like hives  . Other Other (See Comments)    All NUTS and corn due to diverticulitis   Current Outpatient Prescriptions on File Prior to Visit  Medication Sig Dispense Refill  . azithromycin (ZITHROMAX) 250 MG  tablet Take 1 tablet (250 mg total) by mouth daily. Take first 2 tablets together, then 1 every day until finished.  6 tablet  0  . guaiFENesin-codeine 100-10 MG/5ML syrup Take 10 mLs by mouth 3 (three) times daily as needed for cough.  120 mL  0  . Multiple Vitamin (MULTIVITAMIN WITH MINERALS) TABS tablet Take 1 tablet by mouth daily.       No current facility-administered medications on file prior to visit.   Review of Systems  Constitutional: Negative for unusual diaphoresis or other sweats  HENT: Negative for ringing in ear Eyes: Negative for double vision or worsening visual disturbance.  Respiratory: Negative for choking and stridor.   Gastrointestinal: Negative for vomiting or other signifcant bowel change Genitourinary: Negative for hematuria or decreased urine volume.  Musculoskeletal: Negative for other MSK pain or swelling Skin: Negative for color change and worsening wound.  Neurological: Negative for tremors and numbness other than noted  Psychiatric/Behavioral: Negative for decreased concentration or agitation other than above       Objective:   Physical Exam BP 138/88  Pulse 87  Temp(Src) 98.6 F (37 C) (Oral)  Wt 149 lb 4 oz (67.699 kg)  SpO2 95% VS noted, mild ill Constitutional: Pt appears well-developed, well-nourished.  HENT: Head: NCAT.  Right Ear: External ear normal.  Left Ear:  External ear normal.  Eyes: . Pupils are equal, round, and reactive to light. Conjunctivae and EOM are normal Neck: Normal range of motion. Neck supple.  Cardiovascular: Normal rate and regular rhythm.   Pulmonary/Chest: Effort normal and breath sounds decreased, with few wheeze bilat  Neurological: Pt is alert. Not confused , motor grossly intact Skin: Skin is warm. No rash Psychiatric: Pt behavior is normal. No agitation.     Assessment & Plan:

## 2014-03-07 NOTE — Progress Notes (Signed)
Pre visit review using our clinic review tool, if applicable. No additional management support is needed unless otherwise documented below in the visit note. 

## 2014-03-07 NOTE — Patient Instructions (Signed)
Please take all new medication as prescribed - the prednisone  You are also given the inhaler to use as needed for shortness of breath  Please continue all other medications as before, and refills have been done if requested. Please have the pharmacy call with any other refills you may need.

## 2014-03-08 NOTE — ED Provider Notes (Signed)
CSN: [id number]     Arrival date & time 03/05/14  1548 History   First MD Initiated Contact with Patient 03/05/14 1627     Chief Complaint  Patient presents with  . Cough  . Shortness of Breath     (Consider location/radiation/quality/duration/timing/severity/associated sxs/prior Treatment) HPI  58 year old female with fever and cough. Symptom onset last night progressively worsening. Patient has pain in the center of her chest which is worse with coughing. Brownish colored sputum. Generalized fatigue and body aches. Subjective fever. Unusual leg pain or swelling. No sick contacts. Patient smokes.  Past Medical History  Diagnosis Date  . DEPRESSION   . HYPERLIPIDEMIA   . UTI'S, CHRONIC   . DISC DISEASE, CERVICAL   . NEPHROLITHIASIS, HX OF   . Umbilical hernia   . Chronic neck pain     with radiculopathy  . Anxiety   . Diverticulitis   . Chronic pain of left wrist 2009    "since neck surgery"  . Recurrent abdominal pain    Past Surgical History  Procedure Laterality Date  . Appendectomy  1981  . Abdominal hysterectomy  2001  . Left oophorectomy  2001  . Neck surgery  2009    fusion of C1, C2, C3  . Kidney stone surgery  2007  . Myomectomy  1981   Family History  Problem Relation Age of Onset  . Hypertension Mother   . Diabetes Mother   . Hyperlipidemia Mother   . Heart attack Father     MI  . Hyperlipidemia Father   . Hypertension Father   . Diabetes Father   . Heart disease Father   . Cancer Neg Hx     Negative for Breast or Colon Cancer   History  Substance Use Topics  . Smoking status: Former Smoker -- 0.50 packs/day for 30 years    Types: Cigarettes    Quit date: 11/18/2013  . Smokeless tobacco: Never Used  . Alcohol Use: No   OB History   Grav Para Term Preterm Abortions TAB SAB Ect Mult Living                 Review of Systems All systems reviewed and negative, other than as noted in HPI.    Allergies  Ciprofloxacin; Ofloxacin; and  Other  Home Medications   Prior to Admission medications   Medication Sig Start Date End Date Taking? Authorizing Provider  Multiple Vitamin (MULTIVITAMIN WITH MINERALS) TABS tablet Take 1 tablet by mouth daily.   Yes Historical Provider, MD  albuterol (PROVENTIL HFA;VENTOLIN HFA) 108 (90 BASE) MCG/ACT inhaler Inhale 2 puffs into the lungs every 6 (six) hours as needed for wheezing or shortness of breath. 03/07/14   Biagio Borg, MD  azithromycin (ZITHROMAX) 250 MG tablet Take 1 tablet (250 mg total) by mouth daily. Take first 2 tablets together, then 1 every day until finished. 03/05/14   Virgel Manifold, MD  guaiFENesin-codeine 100-10 MG/5ML syrup Take 10 mLs by mouth 3 (three) times daily as needed for cough. 03/05/14   Virgel Manifold, MD  predniSONE (DELTASONE) 10 MG tablet 3 tabs by mouth per day for 3 days,2tabs per day for 3 days,1tab per day for 3 days 03/07/14   Biagio Borg, MD   BP 130/78  Pulse 84  Temp(Src) 98.6 F (37 C) (Oral)  Resp 18  SpO2 100% Physical Exam  Nursing note and vitals reviewed. Constitutional: She appears well-developed and well-nourished. No distress.  HENT:  Head: Normocephalic and atraumatic.  Eyes: Conjunctivae are normal. Right eye exhibits no discharge. Left eye exhibits no discharge.  Neck: Neck supple.  Cardiovascular: Normal rate, regular rhythm and normal heart sounds.  Exam reveals no gallop and no friction rub.   No murmur heard. Pulmonary/Chest: Effort normal. No respiratory distress. She has wheezes.  Abdominal: Soft. She exhibits no distension. There is no tenderness.  Musculoskeletal: She exhibits no edema and no tenderness.  Lower extremities symmetric as compared to each other. No calf tenderness. Negative Homan's. No palpable cords.   Neurological: She is alert.  Skin: Skin is warm and dry.  Psychiatric: She has a normal mood and affect. Her behavior is normal. Thought content normal.    ED Course  Procedures (including critical care  time) Labs Review Labs Reviewed  CBC WITH DIFFERENTIAL - Abnormal; Notable for the following:    Platelets 419 (*)    All other components within normal limits  BASIC METABOLIC PANEL - Abnormal; Notable for the following:    Sodium 135 (*)    Glucose, Bld 101 (*)    All other components within normal limits  URINALYSIS, ROUTINE W REFLEX MICROSCOPIC - Abnormal; Notable for the following:    Hgb urine dipstick SMALL (*)    All other components within normal limits  URINE MICROSCOPIC-ADD ON    Imaging Review No results found.   EKG Interpretation   Date/Time:  Monday March 05 2014 16:03:36 EDT Ventricular Rate:  84 PR Interval:  140 QRS Duration: 84 QT Interval:  370 QTC Calculation: 437 R Axis:   79 Text Interpretation:  Sinus rhythm Biatrial enlargement Baseline wander in  lead(s) V5 ED PHYSICIAN INTERPRETATION AVAILABLE IN [facility]  Confirmed by TEST, Record ([id number]) on 03/07/2014 7:30:54 AM      MDM   Final diagnoses:  Fever  Productive cough   58 year old female with fever and cough. Consider pneumonia versus viral infection versus much less likely pulmonary embolism or myo/pericarditis. Chest x-ray does not show any focal infiltrate. There is no leukocytosis. She does not appear ill. Normotensive. Oxygen saturations are 100% on room air. Patient is concerned for pneumonia. Clinically it probably isn't, but she was provided with a prescription for azithromycin. As needed cough medication. Return precautions were discussed.   Virgel Manifold, MD 03/08/14 [phone]

## 2014-07-20 ENCOUNTER — Other Ambulatory Visit: Payer: Self-pay

## 2014-08-02 ENCOUNTER — Emergency Department (HOSPITAL_COMMUNITY)
Admission: EM | Admit: 2014-08-02 | Discharge: 2014-08-02 | Disposition: A | Discharge status: Home / Self Care | Payer: Medicare Other

## 2014-08-13 ENCOUNTER — Ambulatory Visit (INDEPENDENT_AMBULATORY_CARE_PROVIDER_SITE_OTHER): Payer: Medicare Other | Admitting: Internal Medicine

## 2014-08-13 ENCOUNTER — Ambulatory Visit (INDEPENDENT_AMBULATORY_CARE_PROVIDER_SITE_OTHER)
Admission: RE | Admit: 2014-08-13 | Discharge: 2014-08-13 | Disposition: A | Discharge status: Home / Self Care | Payer: Medicare Other | Source: Ambulatory Visit | Attending: Internal Medicine | Admitting: Internal Medicine

## 2014-08-13 ENCOUNTER — Encounter: Payer: Self-pay | Admitting: Internal Medicine

## 2014-08-13 DIAGNOSIS — M542 Cervicalgia: Secondary | ICD-10-CM

## 2014-08-13 DIAGNOSIS — M546 Pain in thoracic spine: Secondary | ICD-10-CM

## 2014-08-13 MED ORDER — IBUPROFEN 600 MG PO TABS: 600.0000 mg | ORAL_TABLET | Freq: Three times a day (TID) | ORAL | Status: DC | PRN

## 2014-08-13 NOTE — Progress Notes (Signed)
Pre visit review using our clinic review tool, if applicable. No additional management support is needed unless otherwise documented below in the visit note. 

## 2014-08-13 NOTE — Patient Instructions (Signed)

## 2014-08-14 NOTE — Assessment & Plan Note (Signed)
Prior hardware is stable Exam is normal Will start nsaids for the pain

## 2014-08-14 NOTE — Assessment & Plan Note (Signed)
Exam and xrays are okay Will start nsaids for the pain

## 2014-08-14 NOTE — Assessment & Plan Note (Signed)
Her exam is normal Will check xrays for fractures, etc.
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
## 2014-08-24 ENCOUNTER — Other Ambulatory Visit (INDEPENDENT_AMBULATORY_CARE_PROVIDER_SITE_OTHER): Payer: Medicare Other

## 2014-08-24 ENCOUNTER — Ambulatory Visit (INDEPENDENT_AMBULATORY_CARE_PROVIDER_SITE_OTHER): Payer: Medicare Other | Admitting: Internal Medicine

## 2014-08-24 ENCOUNTER — Encounter: Payer: Self-pay | Admitting: Internal Medicine

## 2014-08-24 ENCOUNTER — Other Ambulatory Visit (HOSPITAL_COMMUNITY)
Admission: RE | Admit: 2014-08-24 | Discharge: 2014-08-24 | Disposition: A | Discharge status: Home / Self Care | Payer: Medicare Other | Source: Ambulatory Visit | Attending: Internal Medicine | Admitting: Internal Medicine

## 2014-08-24 VITALS — BP 136/80 | HR 76 | Temp 98.3°F | Resp 16 | Ht 66.0 in | Wt 151.0 lb

## 2014-08-24 DIAGNOSIS — Z124 Encounter for screening for malignant neoplasm of cervix: Secondary | ICD-10-CM | POA: Insufficient documentation

## 2014-08-24 DIAGNOSIS — Z23 Encounter for immunization: Secondary | ICD-10-CM

## 2014-08-24 DIAGNOSIS — Z1231 Encounter for screening mammogram for malignant neoplasm of breast: Secondary | ICD-10-CM

## 2014-08-24 DIAGNOSIS — K5909 Other constipation: Secondary | ICD-10-CM

## 2014-08-24 DIAGNOSIS — E785 Hyperlipidemia, unspecified: Secondary | ICD-10-CM

## 2014-08-24 DIAGNOSIS — M5030 Other cervical disc degeneration, unspecified cervical region: Secondary | ICD-10-CM

## 2014-08-24 DIAGNOSIS — Z0000 Encounter for general adult medical examination without abnormal findings: Secondary | ICD-10-CM

## 2014-08-24 LAB — CBC WITH DIFFERENTIAL/PLATELET
BASOS PCT: 0.9 % (ref 0.0–3.0)
Basophils Absolute: 0.1 10*3/uL (ref 0.0–0.1)
EOS ABS: 0.1 10*3/uL (ref 0.0–0.7)
Eosinophils Relative: 0.9 % (ref 0.0–5.0)
HCT: 41.8 % (ref 36.0–46.0)
Hemoglobin: 13.8 g/dL (ref 12.0–15.0)
Lymphs Abs: 4.1 10*3/uL — ABNORMAL HIGH (ref 0.7–4.0)
MCHC: 32.9 g/dL (ref 30.0–36.0)
MCV: 90.8 fl (ref 78.0–100.0)
MONO ABS: 0.5 10*3/uL (ref 0.1–1.0)
Monocytes Relative: 7.8 % (ref 3.0–12.0)
NEUTROS PCT: 29.3 % — AB (ref 43.0–77.0)
Neutro Abs: 2 10*3/uL (ref 1.4–7.7)
Platelets: 378 10*3/uL (ref 150.0–400.0)
RBC: 4.61 Mil/uL (ref 3.87–5.11)
RDW: 13.9 % (ref 11.5–15.5)
WBC: 6.8 10*3/uL (ref 4.0–10.5)

## 2014-08-24 LAB — COMPREHENSIVE METABOLIC PANEL
ALBUMIN: 4.8 g/dL (ref 3.5–5.2)
ALT: 18 U/L (ref 0–35)
AST: 17 U/L (ref 0–37)
Alkaline Phosphatase: 73 U/L (ref 39–117)
BUN: 11 mg/dL (ref 6–23)
CO2: 27 mEq/L (ref 19–32)
Calcium: 9.8 mg/dL (ref 8.4–10.5)
Chloride: 103 mEq/L (ref 96–112)
Creatinine, Ser: 0.7 mg/dL (ref 0.4–1.2)
GFR: 108.69 mL/min (ref >60.00)
Glucose, Bld: 105 mg/dL — ABNORMAL HIGH (ref 70–99)
POTASSIUM: 4.2 meq/L (ref 3.5–5.1)
SODIUM: 138 meq/L (ref 135–145)
Total Bilirubin: 0.6 mg/dL (ref 0.2–1.2)
Total Protein: 7.9 g/dL (ref 6.0–8.3)

## 2014-08-24 LAB — LIPID PANEL
CHOL/HDL RATIO: 4
Cholesterol: 231 mg/dL — ABNORMAL HIGH (ref 0–200)
HDL: 59.3 mg/dL (ref >39.00)
LDL Cholesterol: 156 mg/dL — ABNORMAL HIGH (ref 0–99)
NonHDL: 171.7
TRIGLYCERIDES: 79 mg/dL (ref 0.0–149.0)
VLDL: 15.8 mg/dL (ref 0.0–40.0)

## 2014-08-24 LAB — FECAL OCCULT BLOOD, GUAIAC: Fecal Occult Blood: NEGATIVE

## 2014-08-24 LAB — TSH: TSH: 1.12 u[IU]/mL (ref 0.35–4.50)

## 2014-08-24 NOTE — Progress Notes (Signed)
Pre visit review using our clinic review tool, if applicable. No additional management support is needed unless otherwise documented below in the visit note. 

## 2014-08-24 NOTE — Patient Instructions (Signed)
Preventive Care for Adults A healthy lifestyle and preventive care can promote health and wellness. Preventive health guidelines for women include the following key practices.  A routine yearly physical is a good way to check with your health care provider about your health and preventive screening. It is a chance to share any concerns and updates on your health and to receive a thorough exam.  Visit your dentist for a routine exam and preventive care every 6 months. Brush your teeth twice a day and floss once a day. Good oral hygiene prevents tooth decay and gum disease.  The frequency of eye exams is based on your age, health, family medical history, use of contact lenses, and other factors. Follow your health care provider's recommendations for frequency of eye exams.  Eat a healthy diet. Foods like vegetables, fruits, whole grains, low-fat dairy products, and lean protein foods contain the nutrients you need without too many calories. Decrease your intake of foods high in solid fats, added sugars, and salt. Eat the right amount of calories for you.Get information about a proper diet from your health care provider, if necessary.  Regular physical exercise is one of the most important things you can do for your health. Most adults should get at least 150 minutes of moderate-intensity exercise (any activity that increases your heart rate and causes you to sweat) each week. In addition, most adults need muscle-strengthening exercises on 2 or more days a week.  Maintain a healthy weight. The body mass index (BMI) is a screening tool to identify possible weight problems. It provides an estimate of body fat based on height and weight. Your health care provider can find your BMI and can help you achieve or maintain a healthy weight.For adults 20 years and older:  A BMI below 18.5 is considered underweight.  A BMI of 18.5 to 24.9 is normal.  A BMI of 25 to 29.9 is considered overweight.  A BMI of  30 and above is considered obese.  Maintain normal blood lipids and cholesterol levels by exercising and minimizing your intake of saturated fat. Eat a balanced diet with plenty of fruit and vegetables. Blood tests for lipids and cholesterol should begin at age 76 and be repeated every 5 years. If your lipid or cholesterol levels are high, you are over 50, or you are at high risk for heart disease, you may need your cholesterol levels checked more frequently.Ongoing high lipid and cholesterol levels should be treated with medicines if diet and exercise are not working.  If you smoke, find out from your health care provider how to quit. If you do not use tobacco, do not start.  Lung cancer screening is recommended for adults aged 22-80 years who are at high risk for developing lung cancer because of a history of smoking. A yearly low-dose CT scan of the lungs is recommended for people who have at least a 30-pack-year history of smoking and are a current smoker or have quit within the past 15 years. A pack year of smoking is smoking an average of 1 pack of cigarettes a day for 1 year (for example: 1 pack a day for 30 years or 2 packs a day for 15 years). Yearly screening should continue until the smoker has stopped smoking for at least 15 years. Yearly screening should be stopped for people who develop a health problem that would prevent them from having lung cancer treatment.  If you are pregnant, do not drink alcohol. If you are breastfeeding,  be very cautious about drinking alcohol. If you are not pregnant and choose to drink alcohol, do not have more than 1 drink per day. One drink is considered to be 12 ounces (355 mL) of beer, 5 ounces (148 mL) of wine, or 1.5 ounces (44 mL) of liquor.  Avoid use of street drugs. Do not share needles with anyone. Ask for help if you need support or instructions about stopping the use of drugs.  High blood pressure causes heart disease and increases the risk of  stroke. Your blood pressure should be checked at least every 1 to 2 years. Ongoing high blood pressure should be treated with medicines if weight loss and exercise do not work.  If you are 75-52 years old, ask your health care provider if you should take aspirin to prevent strokes.  Diabetes screening involves taking a blood sample to check your fasting blood sugar level. This should be done once every 3 years, after age 15, if you are within normal weight and without risk factors for diabetes. Testing should be considered at a younger age or be carried out more frequently if you are overweight and have at least 1 risk factor for diabetes.  Breast cancer screening is essential preventive care for women. You should practice "breast self-awareness." This means understanding the normal appearance and feel of your breasts and may include breast self-examination. Any changes detected, no matter how small, should be reported to a health care provider. Women in their 58s and 30s should have a clinical breast exam (CBE) by a health care provider as part of a regular health exam every 1 to 3 years. After age 16, women should have a CBE every year. Starting at age 53, women should consider having a mammogram (breast X-ray test) every year. Women who have a family history of breast cancer should talk to their health care provider about genetic screening. Women at a high risk of breast cancer should talk to their health care providers about having an MRI and a mammogram every year.  Breast cancer gene (BRCA)-related cancer risk assessment is recommended for women who have family members with BRCA-related cancers. BRCA-related cancers include breast, ovarian, tubal, and peritoneal cancers. Having family members with these cancers may be associated with an increased risk for harmful changes (mutations) in the breast cancer genes BRCA1 and BRCA2. Results of the assessment will determine the need for genetic counseling and  BRCA1 and BRCA2 testing.  Routine pelvic exams to screen for cancer are no longer recommended for nonpregnant women who are considered low risk for cancer of the pelvic organs (ovaries, uterus, and vagina) and who do not have symptoms. Ask your health care provider if a screening pelvic exam is right for you.  If you have had past treatment for cervical cancer or a condition that could lead to cancer, you need Pap tests and screening for cancer for at least 20 years after your treatment. If Pap tests have been discontinued, your risk factors (such as having a new sexual partner) need to be reassessed to determine if screening should be resumed. Some women have medical problems that increase the chance of getting cervical cancer. In these cases, your health care provider may recommend more frequent screening and Pap tests.  The HPV test is an additional test that may be used for cervical cancer screening. The HPV test looks for the virus that can cause the cell changes on the cervix. The cells collected during the Pap test can be  tested for HPV. The HPV test could be used to screen women aged 30 years and older, and should be used in women of any age who have unclear Pap test results. After the age of 30, women should have HPV testing at the same frequency as a Pap test.  Colorectal cancer can be detected and often prevented. Most routine colorectal cancer screening begins at the age of 50 years and continues through age 75 years. However, your health care provider may recommend screening at an earlier age if you have risk factors for colon cancer. On a yearly basis, your health care provider may provide home test kits to check for hidden blood in the stool. Use of a small camera at the end of a tube, to directly examine the colon (sigmoidoscopy or colonoscopy), can detect the earliest forms of colorectal cancer. Talk to your health care provider about this at age 50, when routine screening begins. Direct  exam of the colon should be repeated every 5-10 years through age 75 years, unless early forms of pre-cancerous polyps or small growths are found.  People who are at an increased risk for hepatitis B should be screened for this virus. You are considered at high risk for hepatitis B if:  You were born in a country where hepatitis B occurs often. Talk with your health care provider about which countries are considered high risk.  Your parents were born in a high-risk country and you have not received a shot to protect against hepatitis B (hepatitis B vaccine).  You have HIV or AIDS.  You use needles to inject street drugs.  You live with, or have sex with, someone who has hepatitis B.  You get hemodialysis treatment.  You take certain medicines for conditions like cancer, organ transplantation, and autoimmune conditions.  Hepatitis C blood testing is recommended for all people born from 1945 through 1965 and any individual with known risks for hepatitis C.  Practice safe sex. Use condoms and avoid high-risk sexual practices to reduce the spread of sexually transmitted infections (STIs). STIs include gonorrhea, chlamydia, syphilis, trichomonas, herpes, HPV, and human immunodeficiency virus (HIV). Herpes, HIV, and HPV are viral illnesses that have no cure. They can result in disability, cancer, and death.  You should be screened for sexually transmitted illnesses (STIs) including gonorrhea and chlamydia if:  You are sexually active and are younger than 24 years.  You are older than 24 years and your health care provider tells you that you are at risk for this type of infection.  Your sexual activity has changed since you were last screened and you are at an increased risk for chlamydia or gonorrhea. Ask your health care provider if you are at risk.  If you are at risk of being infected with HIV, it is recommended that you take a prescription medicine daily to prevent HIV infection. This is  called preexposure prophylaxis (PrEP). You are considered at risk if:  You are a heterosexual woman, are sexually active, and are at increased risk for HIV infection.  You take drugs by injection.  You are sexually active with a partner who has HIV.  Talk with your health care provider about whether you are at high risk of being infected with HIV. If you choose to begin PrEP, you should first be tested for HIV. You should then be tested every 3 months for as long as you are taking PrEP.  Osteoporosis is a disease in which the bones lose minerals and strength   with aging. This can result in serious bone fractures or breaks. The risk of osteoporosis can be identified using a bone density scan. Women ages 65 years and over and women at risk for fractures or osteoporosis should discuss screening with their health care providers. Ask your health care provider whether you should take a calcium supplement or vitamin D to reduce the rate of osteoporosis.  Menopause can be associated with physical symptoms and risks. Hormone replacement therapy is available to decrease symptoms and risks. You should talk to your health care provider about whether hormone replacement therapy is right for you.  Use sunscreen. Apply sunscreen liberally and repeatedly throughout the day. You should seek shade when your shadow is shorter than you. Protect yourself by wearing long sleeves, pants, a wide-brimmed hat, and sunglasses year round, whenever you are outdoors.  Once a month, do a whole body skin exam, using a mirror to look at the skin on your back. Tell your health care provider of new moles, moles that have irregular borders, moles that are larger than a pencil eraser, or moles that have changed in shape or color.  Stay current with required vaccines (immunizations).  Influenza vaccine. All adults should be immunized every year.  Tetanus, diphtheria, and acellular pertussis (Td, Tdap) vaccine. Pregnant women should  receive 1 dose of Tdap vaccine during each pregnancy. The dose should be obtained regardless of the length of time since the last dose. Immunization is preferred during the 27th-36th week of gestation. An adult who has not previously received Tdap or who does not know her vaccine status should receive 1 dose of Tdap. This initial dose should be followed by tetanus and diphtheria toxoids (Td) booster doses every 10 years. Adults with an unknown or incomplete history of completing a 3-dose immunization series with Td-containing vaccines should begin or complete a primary immunization series including a Tdap dose. Adults should receive a Td booster every 10 years.  Varicella vaccine. An adult without evidence of immunity to varicella should receive 2 doses or a second dose if she has previously received 1 dose. Pregnant females who do not have evidence of immunity should receive the first dose after pregnancy. This first dose should be obtained before leaving the health care facility. The second dose should be obtained 4-8 weeks after the first dose.  Human papillomavirus (HPV) vaccine. Females aged 13-26 years who have not received the vaccine previously should obtain the 3-dose series. The vaccine is not recommended for use in pregnant females. However, pregnancy testing is not needed before receiving a dose. If a female is found to be pregnant after receiving a dose, no treatment is needed. In that case, the remaining doses should be delayed until after the pregnancy. Immunization is recommended for any person with an immunocompromised condition through the age of 26 years if she did not get any or all doses earlier. During the 3-dose series, the second dose should be obtained 4-8 weeks after the first dose. The third dose should be obtained 24 weeks after the first dose and 16 weeks after the second dose.  Zoster vaccine. One dose is recommended for adults aged 60 years or older unless certain conditions are  present.  Measles, mumps, and rubella (MMR) vaccine. Adults born before 1957 generally are considered immune to measles and mumps. Adults born in 1957 or later should have 1 or more doses of MMR vaccine unless there is a contraindication to the vaccine or there is laboratory evidence of immunity to   each of the three diseases. A routine second dose of MMR vaccine should be obtained at least 28 days after the first dose for students attending postsecondary schools, health care workers, or international travelers. People who received inactivated measles vaccine or an unknown type of measles vaccine during 1963-1967 should receive 2 doses of MMR vaccine. People who received inactivated mumps vaccine or an unknown type of mumps vaccine before 1979 and are at high risk for mumps infection should consider immunization with 2 doses of MMR vaccine. For females of childbearing age, rubella immunity should be determined. If there is no evidence of immunity, females who are not pregnant should be vaccinated. If there is no evidence of immunity, females who are pregnant should delay immunization until after pregnancy. Unvaccinated health care workers born before 1957 who lack laboratory evidence of measles, mumps, or rubella immunity or laboratory confirmation of disease should consider measles and mumps immunization with 2 doses of MMR vaccine or rubella immunization with 1 dose of MMR vaccine.  Pneumococcal 13-valent conjugate (PCV13) vaccine. When indicated, a person who is uncertain of her immunization history and has no record of immunization should receive the PCV13 vaccine. An adult aged 19 years or older who has certain medical conditions and has not been previously immunized should receive 1 dose of PCV13 vaccine. This PCV13 should be followed with a dose of pneumococcal polysaccharide (PPSV23) vaccine. The PPSV23 vaccine dose should be obtained at least 8 weeks after the dose of PCV13 vaccine. An adult aged 19  years or older who has certain medical conditions and previously received 1 or more doses of PPSV23 vaccine should receive 1 dose of PCV13. The PCV13 vaccine dose should be obtained 1 or more years after the last PPSV23 vaccine dose.  Pneumococcal polysaccharide (PPSV23) vaccine. When PCV13 is also indicated, PCV13 should be obtained first. All adults aged 65 years and older should be immunized. An adult younger than age 65 years who has certain medical conditions should be immunized. Any person who resides in a nursing home or long-term care facility should be immunized. An adult smoker should be immunized. People with an immunocompromised condition and certain other conditions should receive both PCV13 and PPSV23 vaccines. People with human immunodeficiency virus (HIV) infection should be immunized as soon as possible after diagnosis. Immunization during chemotherapy or radiation therapy should be avoided. Routine use of PPSV23 vaccine is not recommended for American Indians, [place] Natives, or people younger than 65 years unless there are medical conditions that require PPSV23 vaccine. When indicated, people who have unknown immunization and have no record of immunization should receive PPSV23 vaccine. One-time revaccination 5 years after the first dose of PPSV23 is recommended for people aged 19-64 years who have chronic kidney failure, nephrotic syndrome, asplenia, or immunocompromised conditions. People who received 1-2 doses of PPSV23 before age 65 years should receive another dose of PPSV23 vaccine at age 65 years or later if at least 5 years have passed since the previous dose. Doses of PPSV23 are not needed for people immunized with PPSV23 at or after age 65 years.  Meningococcal vaccine. Adults with asplenia or persistent complement component deficiencies should receive 2 doses of quadrivalent meningococcal conjugate (MenACWY-D) vaccine. The doses should be obtained at least 2 months apart.  Microbiologists working with certain meningococcal bacteria, military recruits, people at risk during an outbreak, and people who travel to or live in countries with a high rate of meningitis should be immunized. A first-year college student up through age   21 years who is living in a residence hall should receive a dose if she did not receive a dose on or after her 16th birthday. Adults who have certain high-risk conditions should receive one or more doses of vaccine.  Hepatitis A vaccine. Adults who wish to be protected from this disease, have certain high-risk conditions, work with hepatitis A-infected animals, work in hepatitis A research labs, or travel to or work in countries with a high rate of hepatitis A should be immunized. Adults who were previously unvaccinated and who anticipate close contact with an international adoptee during the first 60 days after arrival in the [place] from a country with a high rate of hepatitis A should be immunized.  Hepatitis B vaccine. Adults who wish to be protected from this disease, have certain high-risk conditions, may be exposed to blood or other infectious body fluids, are household contacts or sex partners of hepatitis B positive people, are clients or workers in certain care facilities, or travel to or work in countries with a high rate of hepatitis B should be immunized.  Haemophilus influenzae type b (Hib) vaccine. A previously unvaccinated person with asplenia or sickle cell disease or having a scheduled splenectomy should receive 1 dose of Hib vaccine. Regardless of previous immunization, a recipient of a hematopoietic stem cell transplant should receive a 3-dose series 6-12 months after her successful transplant. Hib vaccine is not recommended for adults with HIV infection. Preventive Services / Frequency Ages 64 to 68 years  Blood pressure check.** / Every 1 to 2 years.  Lipid and cholesterol check.** / Every 5 years beginning at age  22.  Clinical breast exam.** / Every 3 years for women in their 88s and 53s.  BRCA-related cancer risk assessment.** / For women who have family members with a BRCA-related cancer (breast, ovarian, tubal, or peritoneal cancers).  Pap test.** / Every 2 years from ages 90 through 51. Every 3 years starting at age 21 through age 56 or 3 with a history of 3 consecutive normal Pap tests.  HPV screening.** / Every 3 years from ages 24 through ages 1 to 46 with a history of 3 consecutive normal Pap tests.  Hepatitis C blood test.** / For any individual with known risks for hepatitis C.  Skin self-exam. / Monthly.  Influenza vaccine. / Every year.  Tetanus, diphtheria, and acellular pertussis (Tdap, Td) vaccine.** / Consult your health care provider. Pregnant women should receive 1 dose of Tdap vaccine during each pregnancy. 1 dose of Td every 10 years.  Varicella vaccine.** / Consult your health care provider. Pregnant females who do not have evidence of immunity should receive the first dose after pregnancy.  HPV vaccine. / 3 doses over 6 months, if 72 and younger. The vaccine is not recommended for use in pregnant females. However, pregnancy testing is not needed before receiving a dose.  Measles, mumps, rubella (MMR) vaccine.** / You need at least 1 dose of MMR if you were born in 1957 or later. You may also need a 2nd dose. For females of childbearing age, rubella immunity should be determined. If there is no evidence of immunity, females who are not pregnant should be vaccinated. If there is no evidence of immunity, females who are pregnant should delay immunization until after pregnancy.  Pneumococcal 13-valent conjugate (PCV13) vaccine.** / Consult your health care provider.  Pneumococcal polysaccharide (PPSV23) vaccine.** / 1 to 2 doses if you smoke cigarettes or if you have certain conditions.  Meningococcal vaccine.** /  1 dose if you are age 19 to 21 years and a first-year college  student living in a residence hall, or have one of several medical conditions, you need to get vaccinated against meningococcal disease. You may also need additional booster doses.  Hepatitis A vaccine.** / Consult your health care provider.  Hepatitis B vaccine.** / Consult your health care provider.  Haemophilus influenzae type b (Hib) vaccine.** / Consult your health care provider. Ages 40 to 64 years  Blood pressure check.** / Every 1 to 2 years.  Lipid and cholesterol check.** / Every 5 years beginning at age 20 years.  Lung cancer screening. / Every year if you are aged 55-80 years and have a 30-pack-year history of smoking and currently smoke or have quit within the past 15 years. Yearly screening is stopped once you have quit smoking for at least 15 years or develop a health problem that would prevent you from having lung cancer treatment.  Clinical breast exam.** / Every year after age 40 years.  BRCA-related cancer risk assessment.** / For women who have family members with a BRCA-related cancer (breast, ovarian, tubal, or peritoneal cancers).  Mammogram.** / Every year beginning at age 40 years and continuing for as long as you are in good health. Consult with your health care provider.  Pap test.** / Every 3 years starting at age 30 years through age 65 or 70 years with a history of 3 consecutive normal Pap tests.  HPV screening.** / Every 3 years from ages 30 years through ages 65 to 70 years with a history of 3 consecutive normal Pap tests.  Fecal occult blood test (FOBT) of stool. / Every year beginning at age 50 years and continuing until age 75 years. You may not need to do this test if you get a colonoscopy every 10 years.  Flexible sigmoidoscopy or colonoscopy.** / Every 5 years for a flexible sigmoidoscopy or every 10 years for a colonoscopy beginning at age 50 years and continuing until age 75 years.  Hepatitis C blood test.** / For all people born from 1945 through  1965 and any individual with known risks for hepatitis C.  Skin self-exam. / Monthly.  Influenza vaccine. / Every year.  Tetanus, diphtheria, and acellular pertussis (Tdap/Td) vaccine.** / Consult your health care provider. Pregnant women should receive 1 dose of Tdap vaccine during each pregnancy. 1 dose of Td every 10 years.  Varicella vaccine.** / Consult your health care provider. Pregnant females who do not have evidence of immunity should receive the first dose after pregnancy.  Zoster vaccine.** / 1 dose for adults aged 60 years or older.  Measles, mumps, rubella (MMR) vaccine.** / You need at least 1 dose of MMR if you were born in 1957 or later. You may also need a 2nd dose. For females of childbearing age, rubella immunity should be determined. If there is no evidence of immunity, females who are not pregnant should be vaccinated. If there is no evidence of immunity, females who are pregnant should delay immunization until after pregnancy.  Pneumococcal 13-valent conjugate (PCV13) vaccine.** / Consult your health care provider.  Pneumococcal polysaccharide (PPSV23) vaccine.** / 1 to 2 doses if you smoke cigarettes or if you have certain conditions.  Meningococcal vaccine.** / Consult your health care provider.  Hepatitis A vaccine.** / Consult your health care provider.  Hepatitis B vaccine.** / Consult your health care provider.  Haemophilus influenzae type b (Hib) vaccine.** / Consult your health care provider. Ages 65   years and over  Blood pressure check.** / Every 1 to 2 years.  Lipid and cholesterol check.** / Every 5 years beginning at age 77 years.  Lung cancer screening. / Every year if you are aged 13-80 years and have a 30-pack-year history of smoking and currently smoke or have quit within the past 15 years. Yearly screening is stopped once you have quit smoking for at least 15 years or develop a health problem that would prevent you from having lung cancer  treatment.  Clinical breast exam.** / Every year after age 45 years.  BRCA-related cancer risk assessment.** / For women who have family members with a BRCA-related cancer (breast, ovarian, tubal, or peritoneal cancers).  Mammogram.** / Every year beginning at age 57 years and continuing for as long as you are in good health. Consult with your health care provider.  Pap test.** / Every 3 years starting at age 68 years through age 95 or 39 years with 3 consecutive normal Pap tests. Testing can be stopped between 65 and 70 years with 3 consecutive normal Pap tests and no abnormal Pap or HPV tests in the past 10 years.  HPV screening.** / Every 3 years from ages 14 years through ages 19 or 18 years with a history of 3 consecutive normal Pap tests. Testing can be stopped between 65 and 70 years with 3 consecutive normal Pap tests and no abnormal Pap or HPV tests in the past 10 years.  Fecal occult blood test (FOBT) of stool. / Every year beginning at age 51 years and continuing until age 64 years. You may not need to do this test if you get a colonoscopy every 10 years.  Flexible sigmoidoscopy or colonoscopy.** / Every 5 years for a flexible sigmoidoscopy or every 10 years for a colonoscopy beginning at age 43 years and continuing until age 52 years.  Hepatitis C blood test.** / For all people born from [birth year] through 1965 and any individual with known risks for hepatitis C.  Osteoporosis screening.** / A one-time screening for women ages 79 years and over and women at risk for fractures or osteoporosis.  Skin self-exam. / Monthly.  Influenza vaccine. / Every year.  Tetanus, diphtheria, and acellular pertussis (Tdap/Td) vaccine.** / 1 dose of Td every 10 years.  Varicella vaccine.** / Consult your health care provider.  Zoster vaccine.** / 1 dose for adults aged 68 years or older.  Pneumococcal 13-valent conjugate (PCV13) vaccine.** / Consult your health care provider.  Pneumococcal  polysaccharide (PPSV23) vaccine.** / 1 dose for all adults aged 13 years and older.  Meningococcal vaccine.** / Consult your health care provider.  Hepatitis A vaccine.** / Consult your health care provider.  Hepatitis B vaccine.** / Consult your health care provider.  Haemophilus influenzae type b (Hib) vaccine.** / Consult your health care provider. ** Family history and personal history of risk and conditions may change your health care provider's recommendations. Document Released: 11/17/2001 Document Revised: 02/05/2014 Document Reviewed: 02/16/2011 [facility] Patient Information 2015 [place], [place]. This information is not intended to replace advice given to you by your health care provider. Make sure you discuss any questions you have with your health care provider. High Cholesterol High cholesterol refers to having a high level of cholesterol in your blood. Cholesterol is a white, waxy, fat-like protein that your body needs in small amounts. Your liver makes all the cholesterol you need. Excess cholesterol comes from the food you eat. Cholesterol travels in your bloodstream through your blood vessels.  If you have high cholesterol, deposits (plaque) may build up on the walls of your blood vessels. This makes the arteries narrower and stiffer. Plaque increases your risk of heart attack and stroke. Work with your health care provider to keep your cholesterol levels in a healthy range. RISK FACTORS Several things can make you more likely to have high cholesterol. These include:   Eating foods high in animal fat (saturated fat) or cholesterol.  Being overweight.  Not getting enough exercise.  Having a family history of high cholesterol. SIGNS AND SYMPTOMS High cholesterol does not cause symptoms. DIAGNOSIS  Your health care provider can do a blood test to check whether you have high cholesterol. If you are older than 20, your health care provider may check your cholesterol every 4-6  years. You may be checked more often if you already have high cholesterol or other risk factors for heart disease. The blood test for cholesterol measures the following:  Bad cholesterol (LDL cholesterol). This is the type of cholesterol that causes heart disease. This number should be less than 100.  Good cholesterol (HDL cholesterol). This type helps protect against heart disease. A healthy level of HDL cholesterol is 60 or higher.  Total cholesterol. This is the combined number of LDL cholesterol and HDL cholesterol. A healthy number is less than 200. TREATMENT  High cholesterol can be treated with diet changes, lifestyle changes, and medicine.   Diet changes may include eating more whole grains, fruits, vegetables, nuts, and fish. You may also have to cut back on red meat and foods with a lot of added sugar.  Lifestyle changes may include getting at least 40 minutes of aerobic exercise three times a week. Aerobic exercises include walking, biking, and swimming. Aerobic exercise along with a healthy diet can help you maintain a healthy weight. Lifestyle changes may also include quitting smoking.  If diet and lifestyle changes are not enough to lower your cholesterol, your health care provider may prescribe a statin medicine. This medicine has been shown to lower cholesterol and also lower the risk of heart disease. HOME CARE INSTRUCTIONS  Only take over-the-counter or prescription medicines as directed by your health care provider.   Follow a healthy diet as directed by your health care provider. For instance:   Eat chicken (without skin), fish, veal, shellfish, ground [place] breast, and round or loin cuts of red meat.  Do not eat fried foods and fatty meats, such as hot dogs and salami.   Eat plenty of fruits, such as apples.   Eat plenty of vegetables, such as broccoli, potatoes, and carrots.   Eat beans, peas, and lentils.   Eat grains, such as barley, rice, couscous, and  bulgur wheat.   Eat pasta without cream sauces.   Use skim or nonfat milk and low-fat or nonfat yogurt and cheeses. Do not eat or drink whole milk, cream, ice cream, egg yolks, and hard cheeses.   Do not eat stick margarine or tub margarines that contain trans fats (also called partially hydrogenated oils).   Do not eat cakes, cookies, crackers, or other baked goods that contain trans fats.   Do not eat saturated tropical oils, such as coconut and palm oil.   Exercise as directed by your health care provider. Increase your activity level with activities such as gardening or walking.   Keep all follow-up appointments.  SEEK MEDICAL CARE IF:  You are struggling to maintain a healthy diet or weight.  You need help starting an  exercise program.  You need help to stop smoking. SEEK IMMEDIATE MEDICAL CARE IF:  You have chest pain.  You have trouble breathing. Document Released: 09/21/2005 Document Revised: 02/05/2014 Document Reviewed: 07/14/2013 [facility] Patient Information 2015 [place], [place]. This information is not intended to replace advice given to you by your health care provider. Make sure you discuss any questions you have with your health care provider.

## 2014-08-24 NOTE — Progress Notes (Signed)
Subjective:    Patient ID: Lauren Orozco, female    DOB: 09-Sep-1956, 58 y.o.   MRN: [id number]  Hyperlipidemia This is a chronic problem. The problem is uncontrolled. Recent lipid tests were reviewed and are high. Exacerbating diseases include obesity. She has no history of chronic renal disease, diabetes, hypothyroidism, liver disease or nephrotic syndrome. Factors aggravating her hyperlipidemia include smoking. Pertinent negatives include no chest pain, focal sensory loss, focal weakness, leg pain, myalgias or shortness of breath. She is currently on no antihyperlipidemic treatment. The current treatment provides no improvement of lipids. Compliance problems include adherence to diet and adherence to exercise.       Review of Systems  Constitutional: Negative.  Negative for fever, chills, diaphoresis, appetite change and fatigue.  HENT: Negative.   Eyes: Negative.   Respiratory: Negative.  Negative for cough, choking, chest tightness, shortness of breath and stridor.   Cardiovascular: Negative.  Negative for chest pain, palpitations and leg swelling.  Gastrointestinal: Negative.  Negative for nausea, abdominal pain, diarrhea, constipation and blood in stool.  Endocrine: Negative.   Genitourinary: Negative.   Musculoskeletal: Positive for neck pain and neck stiffness. Negative for myalgias, back pain, joint swelling, arthralgias and gait problem.  Skin: Negative.   Allergic/Immunologic: Negative.   Neurological: Negative.  Negative for tremors, focal weakness, seizures, syncope, speech difficulty, light-headedness, numbness and headaches.  Hematological: Negative.  Negative for adenopathy. Does not bruise/bleed easily.  Psychiatric/Behavioral: Negative.        Objective:   Physical Exam  Constitutional: She is oriented to person, place, and time. She appears well-developed and well-nourished. No distress.  HENT:  Head: Normocephalic and atraumatic.  Mouth/Throat:  Oropharynx is clear and moist. No oropharyngeal exudate.  Eyes: Conjunctivae are normal. Right eye exhibits no discharge. Left eye exhibits no discharge. No scleral icterus.  Neck: Normal range of motion. Neck supple. No JVD present. No tracheal deviation present. No thyromegaly present.  Cardiovascular: Normal rate, regular rhythm, normal heart sounds and intact distal pulses.  Exam reveals no gallop and no friction rub.   No murmur heard. Pulmonary/Chest: Effort normal and breath sounds normal. No stridor. No respiratory distress. She has no wheezes. She has no rales. She exhibits no tenderness.  Abdominal: Soft. Bowel sounds are normal. She exhibits no distension and no mass. There is no tenderness. There is no rebound and no guarding. Hernia confirmed negative in the right inguinal area and confirmed negative in the left inguinal area.  Genitourinary: Rectum normal and vagina normal. Rectal exam shows no external hemorrhoid, no internal hemorrhoid, no fissure, no mass, no tenderness and anal tone normal. Guaiac negative stool. No breast swelling, tenderness, discharge or bleeding. Pelvic exam was performed with patient supine. No labial fusion. There is no rash, tenderness, lesion or injury on the right labia. There is no rash, tenderness, lesion or injury on the left labia. Cervix exhibits no discharge. Right adnexum displays no mass, no tenderness and no fullness. Left adnexum displays no mass, no tenderness and no fullness. No erythema, tenderness or bleeding in the vagina. No foreign body around the vagina. No signs of injury around the vagina. No vaginal discharge found.  Musculoskeletal: Normal range of motion. She exhibits no edema or tenderness.  Lymphadenopathy:    She has no cervical adenopathy.       Right: No inguinal adenopathy present.       Left: No inguinal adenopathy present.  Neurological: She is oriented to person, place, and time.  Skin: Skin  is warm and dry. No rash noted. She  is not diaphoretic. No erythema. No pallor.  Psychiatric: She has a normal mood and affect. Her behavior is normal. Judgment and thought content normal.  Vitals reviewed.     Lab Results  Component Value Date   WBC 8.6 03/05/2014   HGB 13.1 03/05/2014   HCT 37.7 03/05/2014   PLT 419* 03/05/2014   GLUCOSE 101* 03/05/2014   CHOL 227* 10/19/2011   TRIG 105.0 10/19/2011   HDL 51.90 10/19/2011   LDLDIRECT 150.8 10/19/2011   ALT 13 09/20/2013   AST 15 09/20/2013   NA 135* 03/05/2014   K 3.8 03/05/2014   CL 96 03/05/2014   CREATININE 0.64 03/05/2014   BUN 8 03/05/2014   CO2 23 03/05/2014   TSH 0.75 10/19/2011      Assessment & Plan:

## 2014-08-26 MED ORDER — ATORVASTATIN CALCIUM 40 MG PO TABS: 40.0000 mg | ORAL_TABLET | Freq: Every day | ORAL | Status: DC

## 2014-08-26 NOTE — Assessment & Plan Note (Signed)
She wants to see a specialist about Will refer to sports medicine

## 2014-08-26 NOTE — Assessment & Plan Note (Signed)
She agrees to start a statin

## 2014-08-27 ENCOUNTER — Encounter: Payer: Self-pay | Admitting: Internal Medicine

## 2014-08-28 ENCOUNTER — Encounter: Payer: Self-pay | Admitting: Internal Medicine

## 2014-08-28 LAB — CYTOLOGY - PAP

## 2014-08-29 ENCOUNTER — Encounter: Payer: Self-pay | Admitting: Internal Medicine

## 2014-08-29 LAB — HM PAP SMEAR: HM Pap smear: NORMAL

## 2014-09-03 ENCOUNTER — Ambulatory Visit (INDEPENDENT_AMBULATORY_CARE_PROVIDER_SITE_OTHER): Payer: Medicare Other | Admitting: Internal Medicine

## 2014-09-03 ENCOUNTER — Other Ambulatory Visit (INDEPENDENT_AMBULATORY_CARE_PROVIDER_SITE_OTHER): Payer: Medicare Other

## 2014-09-03 ENCOUNTER — Encounter: Payer: Self-pay | Admitting: Internal Medicine

## 2014-09-03 VITALS — BP 120/84 | HR 77 | Temp 98.3°F | Resp 16 | Ht 66.0 in | Wt 152.0 lb

## 2014-09-03 DIAGNOSIS — D72820 Lymphocytosis (symptomatic): Secondary | ICD-10-CM | POA: Insufficient documentation

## 2014-09-03 DIAGNOSIS — Z0000 Encounter for general adult medical examination without abnormal findings: Secondary | ICD-10-CM

## 2014-09-03 LAB — SEDIMENTATION RATE: Sed Rate: 20 mm/hr (ref 0–22)

## 2014-09-03 LAB — CBC WITH DIFFERENTIAL/PLATELET
BASOS ABS: 0 10*3/uL (ref 0.0–0.1)
Basophils Relative: 0.7 % (ref 0.0–3.0)
EOS ABS: 0.1 10*3/uL (ref 0.0–0.7)
Eosinophils Relative: 1.7 % (ref 0.0–5.0)
HCT: 40 % (ref 36.0–46.0)
HEMOGLOBIN: 13.2 g/dL (ref 12.0–15.0)
LYMPHS ABS: 3.4 10*3/uL (ref 0.7–4.0)
MCHC: 33 g/dL (ref 30.0–36.0)
MCV: 91.1 fl (ref 78.0–100.0)
Monocytes Absolute: 0.5 10*3/uL (ref 0.1–1.0)
Monocytes Relative: 9.2 % (ref 3.0–12.0)
Neutro Abs: 1.7 10*3/uL (ref 1.4–7.7)
Neutrophils Relative %: 29.9 % — ABNORMAL LOW (ref 43.0–77.0)
PLATELETS: 370 10*3/uL (ref 150.0–400.0)
RBC: 4.39 Mil/uL (ref 3.87–5.11)
RDW: 13.9 % (ref 11.5–15.5)
WBC: 5.8 10*3/uL (ref 4.0–10.5)

## 2014-09-03 LAB — HEPATITIS C ANTIBODY: HCV Ab: NEGATIVE

## 2014-09-03 NOTE — Patient Instructions (Signed)
Complete Blood Count A complete blood count is a group of tests that measures several characteristics of the three types of cells in your blood. The liquid portion of your blood (plasma) is not used in these tests. Irregularities found in results from these tests can indicate different conditions, such as anemia, infections, bleeding problems, and cancers. The blood tests included in a complete blood count can be broken down into the cell types that they examine and what they measure:   White blood cells.  White blood cell count. This is a measurement of the number of white blood cells in a standard volume (concentration) in your blood sample.  White blood cell differential. This identifies the types of white blood cells and the concentration of each in the sample of your blood. There are five different types of white blood cells. They all help you fight infection but in different ways. The differential also identifies immature white blood cells.  Red blood cells.  Red blood cell count. This is a measurement of the concentration of red blood cells in your blood sample.  Hemoglobin. This is a measurement of the amount of hemoglobin in the sample of your blood. This measurement indicates your blood's overall oxygen-carrying capacity.  Hematocrit. This is a measurement of the percentage of space that the red blood cells take up in your blood sample.  Mean corpuscular volume. This is a measurement of the average size of your red blood cells.  Mean corpuscular hemoglobin. This is a measurement of the average amount of hemoglobin inside each of your red blood cells.  Mean corpuscular hemoglobin concentration. This is a calculation of the average concentration of hemoglobin inside each of your red blood cells in your blood sample.  Red blood cell distribution width. This is a measurement of the variation in the size of your red blood cells.  Platelets.  The platelet count. This is a measurement  of the concentration of platelets in your blood sample.  Mean platelet volume. This is a measurement of the average size of the platelets in your blood sample. RESULTS It is your responsibility to obtain your test results. Ask the laboratory or department performing the test when and how you will get your results. Contact your health care provider to discuss any questions you have about your results. Results outside of normal ranges can be an indication of an illness. Examples of abnormal results and possible causes are listed as follows:   White blood cells.  An abnormally low concentration of white blood cells can be caused by certain infections and by conditions that interfere with white blood cell production that happens in the inner part of your bone (bone marrow).  An abnormally high concentration of white blood cells often indicates infections and conditions that cause inflammation. It can also be an indication of blood-related cancer.  Immature white blood cells can indicate an infection or an abnormal condition affecting your bone marrow.  Red blood cells.  An abnormally low concentration of red blood cells, hemoglobin, or hematocrit is called anemia.  An abnormally high concentration of red blood cells, hemoglobin, or hematocrit is called polycythemia. Abnormally high levels of red blood cells can indicate mild thalassemia. Thalassemia is a type of anemia that is passed down through families (hereditary).  When your mean corpuscular volume is abnormally low, your red blood cells are smaller than normal. This can be caused by thalassemia or iron deficiency anemia. Iron deficiency anemia is a type of anemia that is the   result of a deficiency of a nutrient (deficiency anemia). In this case, the nutrient is iron.  An abnormally high mean corpuscular volume means your red blood cells are larger than normal. This can indicate a deficiency anemia caused by a lack of vitamin B12. An  abnormally high mean corpuscular volume also can be caused by a lot of new red blood cells in your blood. This can happen after you have suddenly lost a lot of blood.  Abnormally low mean corpuscular hemoglobin concentration can indicate conditions in which your hemoglobin is abnormally diluted inside the red cells. Examples of these conditions are iron deficiency anemia and thalassemia.  An abnormally high mean corpuscular hemoglobin concentration can indicate the presence of certain hemolytic anemias. Hemolytic anemia is anemia that results from the abnormal breakdown of your red blood cells.  Red cell distribution width is abnormally increased in certain anemias, when new red blood cells are produced after acute blood loss, and with severe thalassemia.  Platelets.  An abnormally low concentration of platelets can be a sign of a bleeding disorder.  An abnormally high concentration of platelets can occur with iron deficiency anemia, inflammatory disorders, and cancers, or result from physical stresses, such as exercise or blood loss. However, it may also be a sign of a clotting disorder.  New platelets are larger than old platelets. An abnormally high mean platelet volume occurs with a large increase in the number of new platelets being produced by your bone marrow. This can happen after the loss of a large amount of blood or the destruction of your platelets by antibodies. An abnormally high mean platelet volume also can occur with certain bone marrow cancers. Document Released: 10/24/2004 Document Revised: 02/05/2014 Document Reviewed: 02/03/2012 [employer] Patient Information 2015 [employer]. This information is not intended to replace advice given to you by your health care provider. Make sure you discuss any questions you have with your health care provider.  

## 2014-09-03 NOTE — Progress Notes (Signed)
   Subjective:    Patient ID: Lauren Orozco, female    DOB: [birth year], 58 y.o.   MRN: [id number]  HPI  She returns for f/up on recent abnormal CBC that shows a significant increase in lymphocytes. Other than chronic neck and low back pain she offers no complaints.   Review of Systems  Constitutional: Negative.  Negative for fever, chills, diaphoresis, appetite change and fatigue.  HENT: Negative.   Eyes: Negative.   Respiratory: Negative.  Negative for choking, chest tightness, shortness of breath and stridor.   Cardiovascular: Negative.  Negative for chest pain, palpitations and leg swelling.  Gastrointestinal: Negative.  Negative for nausea, vomiting, abdominal pain, diarrhea, constipation and blood in stool.  Endocrine: Negative.   Genitourinary: Negative.   Musculoskeletal: Positive for back pain and arthralgias. Negative for myalgias, joint swelling, gait problem, neck pain and neck stiffness.  Skin: Negative.  Negative for rash.  Allergic/Immunologic: Negative.   Neurological: Negative.  Negative for dizziness, seizures, syncope, speech difficulty, weakness, light-headedness, numbness and headaches.  Hematological: Negative.  Negative for adenopathy. Does not bruise/bleed easily.  Psychiatric/Behavioral: Negative.        Objective:   Physical Exam  Constitutional: She is oriented to person, place, and time. She appears well-developed and well-nourished. No distress.  HENT:  Head: Normocephalic and atraumatic.  Mouth/Throat: Oropharynx is clear and moist. No oropharyngeal exudate.  Eyes: Conjunctivae are normal. Right eye exhibits no discharge. Left eye exhibits no discharge. No scleral icterus.  Neck: Normal range of motion. Neck supple. No JVD present. No tracheal deviation present. No thyromegaly present.  Cardiovascular: Normal rate, regular rhythm, normal heart sounds and intact distal pulses.  Exam reveals no gallop and no friction rub.   No murmur  heard. Pulmonary/Chest: Effort normal and breath sounds normal. No stridor. No respiratory distress. She has no wheezes. She has no rales. She exhibits no tenderness.  Abdominal: Soft. Bowel sounds are normal. She exhibits no distension and no mass. There is no tenderness. There is no rebound and no guarding.  Musculoskeletal: Normal range of motion. She exhibits no edema or tenderness.  Lymphadenopathy:    She has no cervical adenopathy.  Neurological: She is oriented to person, place, and time.  Skin: Skin is warm and dry. No rash noted. She is not diaphoretic. No erythema. No pallor.  Psychiatric: She has a normal mood and affect. Her behavior is normal. Judgment and thought content normal.  Vitals reviewed.    Lab Results  Component Value Date   WBC 6.8 08/24/2014   HGB 13.8 08/24/2014   HCT 41.8 08/24/2014   PLT 378.0 08/24/2014   GLUCOSE 105* 08/24/2014   CHOL 231* 08/24/2014   TRIG 79.0 08/24/2014   HDL 59.30 08/24/2014   LDLDIRECT 150.8 10/19/2011   LDLCALC 156* 08/24/2014   ALT 18 08/24/2014   AST 17 08/24/2014   NA 138 08/24/2014   K 4.2 08/24/2014   CL 103 08/24/2014   CREATININE 0.7 08/24/2014   BUN 11 08/24/2014   CO2 27 08/24/2014   TSH 1.12 08/24/2014       Assessment & Plan:

## 2014-09-03 NOTE — Progress Notes (Signed)
Pre visit review using our clinic review tool, if applicable. No additional management support is needed unless otherwise documented below in the visit note. 

## 2014-09-03 NOTE — Assessment & Plan Note (Signed)
Exam done Vaccines were updated She was referred for a mammo and colonoscopy Labs ordered Pt ed material was given

## 2014-09-03 NOTE — Assessment & Plan Note (Signed)
I will recheck her CBC today to see if this was a lab error Will get an ESR done to screen for inflammation Will check an SPEP to see if there is also a paraproteinemia My concern is for leukemia, lymphoma If the lymphs remain elevated will refer to hematology

## 2014-09-05 ENCOUNTER — Encounter: Payer: Self-pay | Admitting: Internal Medicine

## 2014-09-05 ENCOUNTER — Other Ambulatory Visit: Payer: Self-pay | Admitting: Internal Medicine

## 2014-09-05 DIAGNOSIS — D72820 Lymphocytosis (symptomatic): Secondary | ICD-10-CM

## 2014-09-05 LAB — SPEP & IFE WITH QIG
ALPHA-2-GLOBULIN: 10.4 % (ref 7.1–11.8)
Albumin ELP: 59.9 % (ref 55.8–66.1)
Alpha-1-Globulin: 3.8 % (ref 2.9–4.9)
BETA 2: 5.6 % (ref 3.2–6.5)
Beta Globulin: 5.6 % (ref 4.7–7.2)
GAMMA GLOBULIN: 14.7 % (ref 11.1–18.8)
IGG (IMMUNOGLOBIN G), SERUM: 1160 mg/dL (ref 690–1700)
IgA: 239 mg/dL (ref 69–380)
IgM, Serum: 58 mg/dL (ref 52–322)
Total Protein, Serum Electrophoresis: 7.5 g/dL (ref 6.0–8.3)

## 2014-09-07 ENCOUNTER — Telehealth: Payer: Self-pay | Admitting: Hematology and Oncology

## 2014-09-07 ENCOUNTER — Ambulatory Visit (INDEPENDENT_AMBULATORY_CARE_PROVIDER_SITE_OTHER): Payer: Medicare Other | Admitting: Family Medicine

## 2014-09-07 ENCOUNTER — Encounter: Payer: Self-pay | Admitting: Family Medicine

## 2014-09-07 VITALS — BP 126/82 | HR 78 | Ht 66.25 in | Wt 152.0 lb

## 2014-09-07 DIAGNOSIS — M5030 Other cervical disc degeneration, unspecified cervical region: Secondary | ICD-10-CM

## 2014-09-07 MED ORDER — NITROGLYCERIN 2 % TD OINT
TOPICAL_OINTMENT | TRANSDERMAL | Status: AC
  Filled 2014-09-07: qty 1

## 2014-09-07 MED ORDER — GABAPENTIN 100 MG PO CAPS: 100.0000 mg | ORAL_CAPSULE | Freq: Every day | ORAL | Status: DC

## 2014-09-07 MED ORDER — PREDNISONE 50 MG PO TABS: 50.0000 mg | ORAL_TABLET | Freq: Every day | ORAL | Status: DC
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
## 2014-09-07 NOTE — Telephone Encounter (Signed)
S/W PATIENT AND GAVE NP APPT FOR 12/15 @ 1:30 W/DR. [place] PATIENT NOTIFIED BY [facility]

## 2014-09-07 NOTE — Patient Instructions (Signed)
Verbal instructions given

## 2014-09-18 ENCOUNTER — Encounter: Payer: Self-pay | Admitting: Hematology and Oncology

## 2014-09-18 ENCOUNTER — Ambulatory Visit (HOSPITAL_BASED_OUTPATIENT_CLINIC_OR_DEPARTMENT_OTHER): Payer: Medicare Other | Admitting: Hematology and Oncology

## 2014-09-18 ENCOUNTER — Ambulatory Visit: Payer: Medicare Other

## 2014-09-18 ENCOUNTER — Telehealth: Payer: Self-pay | Admitting: Hematology and Oncology

## 2014-09-18 VITALS — BP 159/83 | HR 78 | Temp 98.4°F | Resp 18 | Ht 66.25 in | Wt 150.8 lb

## 2014-09-18 DIAGNOSIS — D72820 Lymphocytosis (symptomatic): Secondary | ICD-10-CM

## 2014-09-18 NOTE — Assessment & Plan Note (Signed)
Unfortunately, she was placed on prednisone recently. Certainly, low-grade lymphoproliferative disorder or CLL could manifest as lymphocytosis. I do not believe ordering blood work right now would be helpful. I recommend the patient to return in 3 months with blood work a week ahead of time and we will reassess next year to see if the patient have some cervical low-grade lymphoproliferative disorder or not. Clinically, she is not symptomatic. The patient is reassured.

## 2014-09-18 NOTE — Progress Notes (Signed)
Checked in new pt with no financial concerns at this time.  Pt stated she's here for a hematology concern so financial assistance may not be needed but she has Raquel's card for any billing or insurance questions or concerns.

## 2014-09-18 NOTE — Telephone Encounter (Signed)
Gave avs & cal for March. °

## 2014-09-18 NOTE — Progress Notes (Signed)
[facility] NOTE  Patient Care Team: Janith Lima, MD as PCP - General (Internal Medicine)  CHIEF COMPLAINTS/PURPOSE OF CONSULTATION:  Chronic lymphocytosis  HISTORY OF PRESENTING ILLNESS:  Lauren Orozco 58 y.o. female is here because of lymphocytosis without associated elevated WBC.  She was found to have abnormal CBC from abnormal CBC from routine blood work. She denies recent infection. The last prescription antibiotics was more than 3 months ago There is not reported symptoms of sinus congestion, cough, urinary frequency/urgency or dysuria, diarrhea, joint swelling/pain or abnormal skin rash. She has chronic degenerative joint pain. She had no prior history or diagnosis of cancer. Her age appropriate screening programs are up-to-date. The patient has no prior diagnosis of autoimmune disease. Recently, due to severe bone pain, she was prescribed corticosteroids related products with prednisone. She does not smoke. MEDICAL HISTORY:  Past Medical History  Diagnosis Date  . DEPRESSION   . HYPERLIPIDEMIA   . UTI'S, CHRONIC   . DISC DISEASE, CERVICAL   . NEPHROLITHIASIS, HX OF   . Umbilical hernia   . Chronic neck pain     with radiculopathy  . Anxiety   . Diverticulitis   . Chronic pain of left wrist 2009    "since neck surgery"  . Recurrent abdominal pain     SURGICAL HISTORY: Past Surgical History  Procedure Laterality Date  . Appendectomy  1981  . Abdominal hysterectomy  2001  . Left oophorectomy  2001  . Neck surgery  2009    fusion of C1, C2, C3  . Kidney stone surgery  2007  . Myomectomy  1981    SOCIAL HISTORY: History   Social History  . Marital Status: Single    Spouse Name: N/A    Number of Children: 1  . Years of Education: [employer]   Occupational History  . Disabled    Social History Main Topics  . Smoking status: Former Smoker -- 0.50 packs/day for 30 years    Types: Cigarettes    Quit date: 11/18/2013  .  Smokeless tobacco: Never Used  . Alcohol Use: No  . Drug Use: No  . Sexual Activity: Not Currently   Other Topics Concern  . Not on file   Social History Narrative   HS[employer], [employer]-CNA (in process). [place]. Daughter in '91 [place]. Work-disability due to neck and arthritis problems. Lives-alone. No history of physical or sexual abuse    FAMILY HISTORY: Family History  Problem Relation Age of Onset  . Hypertension Mother   . Diabetes Mother   . Hyperlipidemia Mother   . Heart attack Father     MI  . Hyperlipidemia Father   . Hypertension Father   . Diabetes Father   . Heart disease Father   . Cancer Neg Hx     Negative for Breast or Colon Cancer    ALLERGIES:  is allergic to ciprofloxacin; ofloxacin; and other.  MEDICATIONS:  No current outpatient prescriptions on file.   No current facility-administered medications for this visit.    REVIEW OF SYSTEMS:   Constitutional: Denies fevers, chills or abnormal night sweats Eyes: Denies blurriness of vision, double vision or watery eyes Ears, nose, mouth, throat, and face: Denies mucositis or sore throat Respiratory: Denies cough, dyspnea or wheezes Cardiovascular: Denies palpitation, chest discomfort or lower extremity swelling Gastrointestinal:  Denies nausea, heartburn or change in bowel habits Skin: Denies abnormal skin rashes Lymphatics: Denies new lymphadenopathy or easy bruising Neurological:Denies numbness, tingling or new  weaknesses Behavioral/Psych: Mood is stable, no new changes  All other systems were reviewed with the patient and are negative.  PHYSICAL EXAMINATION: ECOG PERFORMANCE STATUS: 0 - Asymptomatic  Filed Vitals:   09/18/14 1343  BP: 159/83  Pulse: 78  Temp: 98.4 F (36.9 C)  Resp: 18   Filed Weights   09/18/14 1343  Weight: 150 lb 12.8 oz (68.402 kg)    GENERAL:alert, no distress and comfortable SKIN: skin color, texture, turgor are normal, no rashes or significant lesions. No  lipoma on the right neck EYES: normal, conjunctiva are pink and non-injected, sclera clear OROPHARYNX:no exudate, no erythema and lips, buccal mucosa, and tongue normal  NECK: supple, thyroid normal size, non-tender, without nodularity LYMPH:  no palpable lymphadenopathy in the cervical, axillary or inguinal LUNGS: clear to auscultation and percussion with normal breathing effort HEART: regular rate & rhythm and no murmurs and no lower extremity edema ABDOMEN:abdomen soft, non-tender and normal bowel sounds. No splenomegaly Musculoskeletal:no cyanosis of digits and no clubbing  PSYCH: alert & oriented x 3 with fluent speech NEURO: no focal motor/sensory deficits  LABORATORY DATA:  I have reviewed the data as listed Recent Results (from the past 2160 hour(s))  Fecal Occult Blood, Guaiac     Status: None   Collection Time: 08/24/14 12:00 AM  Result Value Ref Range   Fecal Occult Blood Negative   Cytology - PAP     Status: None   Collection Time: 08/24/14 12:00 AM  Result Value Ref Range   CYTOLOGY - PAP PAP RESULT   Lipid panel     Status: Abnormal   Collection Time: 08/24/14  2:22 PM  Result Value Ref Range   Cholesterol 231 (H) 0 - 200 mg/dL    Comment: ATP III Classification       Desirable:  < 200 mg/dL               Borderline High:  200 - 239 mg/dL          High:  > = 240 mg/dL   Triglycerides 79.0 0.0 - 149.0 mg/dL    Comment: Normal:  <150 mg/dLBorderline High:  150 - 199 mg/dL   HDL 59.30 >39.00 mg/dL   VLDL 15.8 0.0 - 40.0 mg/dL   LDL Cholesterol 156 (H) 0 - 99 mg/dL   Total CHOL/HDL Ratio 4     Comment:                Men          Women1/2 Average Risk     3.4          3.3Average Risk          5.0          4.42X Average Risk          9.6          7.13X Average Risk          15.0          11.0                       NonHDL 171.70     Comment: NOTE:  Non-HDL goal should be 30 mg/dL higher than patient's LDL goal (i.e. LDL goal of < 70 mg/dL, would have non-HDL goal of < 100  mg/dL)  Comprehensive metabolic panel     Status: Abnormal   Collection Time: 08/24/14  2:22 PM  Result Value Ref Range   Sodium  138 135 - 145 mEq/L   Potassium 4.2 3.5 - 5.1 mEq/L   Chloride 103 96 - 112 mEq/L   CO2 27 19 - 32 mEq/L   Glucose, Bld 105 (H) 70 - 99 mg/dL   BUN 11 6 - 23 mg/dL   Creatinine, Ser 0.7 0.4 - 1.2 mg/dL   Total Bilirubin 0.6 0.2 - 1.2 mg/dL   Alkaline Phosphatase 73 39 - 117 U/L   AST 17 0 - 37 U/L   ALT 18 0 - 35 U/L   Total Protein 7.9 6.0 - 8.3 g/dL   Albumin 4.8 3.5 - 5.2 g/dL   Calcium 9.8 8.4 - 10.5 mg/dL   GFR 108.69 >60.00 mL/min  CBC with Differential     Status: Abnormal   Collection Time: 08/24/14  2:22 PM  Result Value Ref Range   WBC 6.8 4.0 - 10.5 K/uL   RBC 4.61 3.87 - 5.11 Mil/uL   Hemoglobin 13.8 12.0 - 15.0 g/dL   HCT 41.8 36.0 - 46.0 %   MCV 90.8 78.0 - 100.0 fl   MCHC 32.9 30.0 - 36.0 g/dL   RDW 13.9 11.5 - 15.5 %   Platelets 378.0 150.0 - 400.0 K/uL   Neutrophils Relative % 29.3 (L) 43.0 - 77.0 %   Lymphocytes Relative 61.1 Repeated and verified X2. (H) 12.0 - 46.0 %   Monocytes Relative 7.8 3.0 - 12.0 %   Eosinophils Relative 0.9 0.0 - 5.0 %   Basophils Relative 0.9 0.0 - 3.0 %   Neutro Abs 2.0 1.4 - 7.7 K/uL   Lymphs Abs 4.1 (H) 0.7 - 4.0 K/uL   Monocytes Absolute 0.5 0.1 - 1.0 K/uL   Eosinophils Absolute 0.1 0.0 - 0.7 K/uL   Basophils Absolute 0.1 0.0 - 0.1 K/uL  TSH     Status: None   Collection Time: 08/24/14  2:22 PM  Result Value Ref Range   TSH 1.12 0.35 - 4.50 uIU/mL  HM PAP SMEAR     Status: None   Collection Time: 08/29/14 12:00 AM  Result Value Ref Range   HM Pap smear normal   CBC with Differential     Status: Abnormal   Collection Time: 09/03/14  8:29 AM  Result Value Ref Range   WBC 5.8 4.0 - 10.5 K/uL   RBC 4.39 3.87 - 5.11 Mil/uL   Hemoglobin 13.2 12.0 - 15.0 g/dL   HCT 40.0 36.0 - 46.0 %   MCV 91.1 78.0 - 100.0 fl   MCHC 33.0 30.0 - 36.0 g/dL   RDW 13.9 11.5 - 15.5 %   Platelets 370.0 150.0 -  400.0 K/uL   Neutrophils Relative % 29.9 (L) 43.0 - 77.0 %   Lymphocytes Relative 58.5 Repeated and verified X2. (H) 12.0 - 46.0 %   Monocytes Relative 9.2 3.0 - 12.0 %   Eosinophils Relative 1.7 0.0 - 5.0 %   Basophils Relative 0.7 0.0 - 3.0 %   Neutro Abs 1.7 1.4 - 7.7 K/uL   Lymphs Abs 3.4 0.7 - 4.0 K/uL   Monocytes Absolute 0.5 0.1 - 1.0 K/uL   Eosinophils Absolute 0.1 0.0 - 0.7 K/uL   Basophils Absolute 0.0 0.0 - 0.1 K/uL  SPEP & IFE with QIG     Status: None   Collection Time: 09/03/14  8:29 AM  Result Value Ref Range   IgG (Immunoglobin G), Serum 1160 690 - 1700 mg/dL   IgA 239 69 - 380 mg/dL   IgM, Serum 58 52 -  322 mg/dL   Immunofix Electr Int SEE NOTE     Comment: No monoclonal protein identified. Reviewed by Francis Gaines Mammarappallil MD (Electronic Signature on File)    Total Protein, Serum Electrophoresis 7.5 6.0 - 8.3 g/dL   Albumin ELP 59.9 55.8 - 66.1 %   Alpha-1-Globulin 3.8 2.9 - 4.9 %   Alpha-2-Globulin 10.4 7.1 - 11.8 %   Beta Globulin 5.6 4.7 - 7.2 %   Beta 2 5.6 3.2 - 6.5 %   Gamma Globulin 14.7 11.1 - 18.8 %   M-Spike, % NOT DET g/dL   SPE Interp. SEE NOTE     Comment: Normal pattern. Reviewed by Francis Gaines Mammarappallil MD (Electronic Signature on File)    COMMENT (PROTEIN ELECTROPHOR) SEE NOTE     Comment: --------------- Serum protein electrophoresis is a useful screening procedure in the detection of various pathophysiologic states such as inflammation, gammopathies, protein loss and other dysproteinemias.  Immunofixation electrophoresis (IFE) is a more sensitive technique for the identification of M-proteins found in patients with monoclonal gammopathy of unknown significance (MGUS), amyloidosis, early or treated myeloma or macroglobulinemia, solitary plasmacytoma or extramedullary plasmacytoma.   Sedimentation rate     Status: None   Collection Time: 09/03/14  8:29 AM  Result Value Ref Range   Sed Rate 20 0 - 22 mm/hr  Hepatitis C antibody      Status: None   Collection Time: 09/03/14  8:29 AM  Result Value Ref Range   HCV Ab NEGATIVE NEGATIVE   ASSESSMENT & PLAN Lymphocytosis Unfortunately, she was placed on prednisone recently. Certainly, low-grade lymphoproliferative disorder or CLL could manifest as lymphocytosis. I do not believe ordering blood work right now would be helpful. I recommend the patient to return in 3 months with blood work a week ahead of time and we will reassess next year to see if the patient have some cervical low-grade lymphoproliferative disorder or not. Clinically, she is not symptomatic. The patient is reassured.

## 2014-09-24 ENCOUNTER — Encounter: Payer: Self-pay | Admitting: Family Medicine

## 2014-09-24 ENCOUNTER — Ambulatory Visit (INDEPENDENT_AMBULATORY_CARE_PROVIDER_SITE_OTHER): Payer: Medicare Other | Admitting: Family Medicine

## 2014-09-24 VITALS — BP 130/84 | HR 73 | Wt 151.0 lb

## 2014-09-24 DIAGNOSIS — M5030 Other cervical disc degeneration, unspecified cervical region: Secondary | ICD-10-CM

## 2014-09-24 NOTE — Assessment & Plan Note (Signed)
Patient did have an exacerbation. Patient is improving with conservative therapy and I think she will do very well. We discussed continuing the home exercises, icing protocol and we discussed home manipulation techniques with a trigger point. Patient continues to have pain she will come back for further evaluation and treatment. 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 Discussed with patient then like her to follow-up in 3 weeks for further evaluation and treatment.  Spent greater than 25 minutes with patient face-to-face and had greater than 50% of counseling including as described above in assessment and plan.

## 2014-09-24 NOTE — Patient Instructions (Signed)
Good to see you Heat before activity and ice after activity Take a tennis ball and rub it in the right shoulder blade.  Come back in 2-3 weeks and if still in pain we will try some trigger point injections.  Happy holidays!
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
## 2014-10-04 ENCOUNTER — Emergency Department (HOSPITAL_COMMUNITY): Payer: Medicare Other

## 2014-10-04 ENCOUNTER — Encounter (HOSPITAL_COMMUNITY): Payer: Self-pay | Admitting: *Deleted

## 2014-10-04 ENCOUNTER — Emergency Department (HOSPITAL_COMMUNITY)
Admission: EM | Admit: 2014-10-04 | Discharge: 2014-10-05 | Disposition: A | Discharge status: Home / Self Care | Payer: Medicare Other | Attending: Emergency Medicine | Admitting: Emergency Medicine

## 2014-10-04 DIAGNOSIS — R059 Cough, unspecified: Secondary | ICD-10-CM

## 2014-10-04 DIAGNOSIS — G8929 Other chronic pain: Secondary | ICD-10-CM | POA: Insufficient documentation

## 2014-10-04 DIAGNOSIS — Z8659 Personal history of other mental and behavioral disorders: Secondary | ICD-10-CM | POA: Diagnosis not present

## 2014-10-04 DIAGNOSIS — Z8739 Personal history of other diseases of the musculoskeletal system and connective tissue: Secondary | ICD-10-CM | POA: Diagnosis not present

## 2014-10-04 DIAGNOSIS — Z87440 Personal history of urinary (tract) infections: Secondary | ICD-10-CM | POA: Diagnosis not present

## 2014-10-04 DIAGNOSIS — Z87891 Personal history of nicotine dependence: Secondary | ICD-10-CM | POA: Insufficient documentation

## 2014-10-04 DIAGNOSIS — R05 Cough: Secondary | ICD-10-CM | POA: Diagnosis present

## 2014-10-04 DIAGNOSIS — J069 Acute upper respiratory infection, unspecified: Secondary | ICD-10-CM | POA: Insufficient documentation

## 2014-10-04 DIAGNOSIS — Z87442 Personal history of urinary calculi: Secondary | ICD-10-CM | POA: Diagnosis not present

## 2014-10-04 DIAGNOSIS — Z8719 Personal history of other diseases of the digestive system: Secondary | ICD-10-CM | POA: Insufficient documentation

## 2014-10-04 DIAGNOSIS — Z8639 Personal history of other endocrine, nutritional and metabolic disease: Secondary | ICD-10-CM | POA: Insufficient documentation

## 2014-10-04 LAB — CBC WITH DIFFERENTIAL/PLATELET
BASOS PCT: 0 % (ref 0–1)
Basophils Absolute: 0 10*3/uL (ref 0.0–0.1)
Eosinophils Absolute: 0.1 10*3/uL (ref 0.0–0.7)
Eosinophils Relative: 1 % (ref 0–5)
HCT: 36.6 % (ref 36.0–46.0)
Hemoglobin: 12.6 g/dL (ref 12.0–15.0)
LYMPHS PCT: 40 % (ref 12–46)
Lymphs Abs: 3.7 10*3/uL (ref 0.7–4.0)
MCH: 30.7 pg (ref 26.0–34.0)
MCHC: 34.4 g/dL (ref 30.0–36.0)
MCV: 89.1 fL (ref 78.0–100.0)
MONO ABS: 0.8 10*3/uL (ref 0.1–1.0)
Monocytes Relative: 9 % (ref 3–12)
NEUTROS ABS: 4.7 10*3/uL (ref 1.7–7.7)
NEUTROS PCT: 50 % (ref 43–77)
PLATELETS: 362 10*3/uL (ref 150–400)
RBC: 4.11 MIL/uL (ref 3.87–5.11)
RDW: 13.2 % (ref 11.5–15.5)
WBC: 9.2 10*3/uL (ref 4.0–10.5)

## 2014-10-04 LAB — I-STAT CHEM 8, ED
BUN: 9 mg/dL (ref 6–23)
Calcium, Ion: 1.22 mmol/L (ref 1.12–1.23)
Chloride: 103 mEq/L (ref 96–112)
Creatinine, Ser: 0.9 mg/dL (ref 0.50–1.10)
Glucose, Bld: 137 mg/dL — ABNORMAL HIGH (ref 70–99)
HCT: 40 % (ref 36.0–46.0)
HEMOGLOBIN: 13.6 g/dL (ref 12.0–15.0)
POTASSIUM: 3.5 mmol/L (ref 3.5–5.1)
Sodium: 141 mmol/L (ref 135–145)
TCO2: 23 mmol/L (ref 0–100)

## 2014-10-04 MED ORDER — HYDROCODONE-HOMATROPINE 5-1.5 MG/5ML PO SYRP
5.0000 mL | ORAL_SOLUTION | Freq: Once | ORAL | Status: AC
  Administered 2014-10-04: 5 mL via ORAL
  Filled 2014-10-04: qty 5

## 2014-10-04 MED ORDER — SODIUM CHLORIDE 0.9 % IV BOLUS (SEPSIS)
1000.0000 mL | Freq: Once | INTRAVENOUS | Status: AC
  Administered 2014-10-04: 1000 mL via INTRAVENOUS

## 2014-10-04 MED ORDER — HYDROCODONE-HOMATROPINE 5-1.5 MG/5ML PO SYRP: 5.0000 mL | ORAL_SOLUTION | Freq: Four times a day (QID) | ORAL | Status: DC | PRN

## 2014-10-04 NOTE — ED Notes (Signed)
Pt began feeling sick on Monday with a sore throat and then she began having having a URI with a productive cough (brown sputum) and also fever and chills, body aches and some sob starting last night, she woke up coughing last night and sob.  Pt speaking in full sentences in triage, no acute distress with some hoarseness.

## 2014-10-04 NOTE — ED Provider Notes (Signed)
CSN: [id number]     Arrival date & time 10/04/14  2121 History   First MD Initiated Contact with Patient 10/04/14 2134     Chief Complaint  Patient presents with  . URI     (Consider location/radiation/quality/duration/timing/severity/associated sxs/prior Treatment) HPI Comments: Patient presents emergency department with chief complaint of cough. She states that she has had a cough and sore throat since Monday. She reports cough productive for yellow, green, brown sputum. She also complains of tactile fever and chills, but has not measured her temperature. She also complains body aches. She has tried taking throat lozenges, TheraFlu, and using Vicks vapor rub for her symptoms with minimal relief. There are no aggravating factors.  The history is provided by the patient. No language interpreter was used.    Past Medical History  Diagnosis Date  . DEPRESSION   . HYPERLIPIDEMIA   . UTI'S, CHRONIC   . DISC DISEASE, CERVICAL   . NEPHROLITHIASIS, HX OF   . Umbilical hernia   . Chronic neck pain     with radiculopathy  . Anxiety   . Diverticulitis   . Chronic pain of left wrist 2009    "since neck surgery"  . Recurrent abdominal pain    Past Surgical History  Procedure Laterality Date  . Appendectomy  1981  . Abdominal hysterectomy  2001  . Left oophorectomy  2001  . Neck surgery  2009    fusion of C1, C2, C3  . Kidney stone surgery  2007  . Myomectomy  1981   Family History  Problem Relation Age of Onset  . Hypertension Mother   . Diabetes Mother   . Hyperlipidemia Mother   . Heart attack Father     MI  . Hyperlipidemia Father   . Hypertension Father   . Diabetes Father   . Heart disease Father   . Cancer Neg Hx     Negative for Breast or Colon Cancer   History  Substance Use Topics  . Smoking status: Former Smoker -- 0.50 packs/day for 30 years    Types: Cigarettes    Quit date: 11/18/2013  . Smokeless tobacco: Never Used  . Alcohol Use: No   OB History    No data available     Review of Systems  Constitutional: Positive for fever and chills.  HENT: Positive for postnasal drip, rhinorrhea, sinus pressure, sneezing and sore throat.   Respiratory: Positive for cough. Negative for shortness of breath.   Cardiovascular: Negative for chest pain.  Gastrointestinal: Negative for nausea, vomiting, abdominal pain, diarrhea and constipation.  Genitourinary: Negative for dysuria.  All other systems reviewed and are negative.     Allergies  Ciprofloxacin; Ofloxacin; and Other  Home Medications   Prior to Admission medications   Not on File   BP 171/79 mmHg  Pulse 101  Temp(Src) 99 F (37.2 C) (Oral)  Resp 20  Ht 5\' 6"  (1.676 m)  Wt 151 lb (68.493 kg)  BMI 24.38 kg/m2  SpO2 100% Physical Exam  Constitutional: She appears well-developed and well-nourished. No distress.  HENT:  Head: Normocephalic.  Right Ear: External ear normal.  Left Ear: External ear normal.  Mildly erythematous, no tonsillar exudate, no abscess, no stridor, uvula is midline  TMs clear bilaterally  Eyes: Conjunctivae and EOM are normal. Pupils are equal, round, and reactive to light.  Neck: Normal range of motion. Neck supple.  Cardiovascular: Normal rate, regular rhythm and normal heart sounds.  Exam reveals no gallop and no  friction rub.   No murmur heard. Pulmonary/Chest: Effort normal and breath sounds normal. No stridor. No respiratory distress. She has no wheezes. She has no rales. She exhibits no tenderness.  CTAB  Abdominal: Soft. Bowel sounds are normal. She exhibits no distension. There is no tenderness.  Musculoskeletal: Normal range of motion. She exhibits no tenderness.  Neurological: She is alert.  Skin: Skin is warm and dry. No rash noted. She is not diaphoretic.  Psychiatric: She has a normal mood and affect. Her behavior is normal. Judgment and thought content normal.  Nursing note and vitals reviewed.   ED Course  Procedures (including  critical care time) Results for orders placed or performed during the hospital encounter of 10/04/14  CBC with Differential  Result Value Ref Range   WBC 9.2 4.0 - 10.5 K/uL   RBC 4.11 3.87 - 5.11 MIL/uL   Hemoglobin 12.6 12.0 - 15.0 g/dL   HCT 36.6 36.0 - 46.0 %   MCV 89.1 78.0 - 100.0 fL   MCH 30.7 26.0 - 34.0 pg   MCHC 34.4 30.0 - 36.0 g/dL   RDW 13.2 11.5 - 15.5 %   Platelets 362 150 - 400 K/uL   Neutrophils Relative % 50 43 - 77 %   Neutro Abs 4.7 1.7 - 7.7 K/uL   Lymphocytes Relative 40 12 - 46 %   Lymphs Abs 3.7 0.7 - 4.0 K/uL   Monocytes Relative 9 3 - 12 %   Monocytes Absolute 0.8 0.1 - 1.0 K/uL   Eosinophils Relative 1 0 - 5 %   Eosinophils Absolute 0.1 0.0 - 0.7 K/uL   Basophils Relative 0 0 - 1 %   Basophils Absolute 0.0 0.0 - 0.1 K/uL  I-stat chem 8, ed  Result Value Ref Range   Sodium 141 135 - 145 mmol/L   Potassium 3.5 3.5 - 5.1 mmol/L   Chloride 103 96 - 112 mEq/L   BUN 9 6 - 23 mg/dL   Creatinine, Ser 0.90 0.50 - 1.10 mg/dL   Glucose, Bld 137 (H) 70 - 99 mg/dL   Calcium, Ion 1.22 1.12 - 1.23 mmol/L   TCO2 23 0 - 100 mmol/L   Hemoglobin 13.6 12.0 - 15.0 g/dL   HCT 40.0 36.0 - 46.0 %   Dg Chest 2 View  10/04/2014   CLINICAL DATA:  Cough for 4 days.  EXAM: CHEST  2 VIEW  COMPARISON:  Chest radiograph March 05, 2014  FINDINGS: Cardiomediastinal silhouette is nonsuspicious, mildly tortuous partially calcified aorta. No pleural effusions. No focal consolidation. No pneumothorax. Mild degenerative change of thoracic spine ACDF. Soft tissue planes are unremarkable.  IMPRESSION: No active cardiopulmonary disease. Stable appearance of the chest from March 05, 2014.   Electronically Signed   By: Elon Alas   On: 10/04/2014 23:34      Imaging Review No results found.   EKG Interpretation None      MDM   Final diagnoses:  Cough    Pt CXR negative for acute infiltrate. Patients symptoms are consistent with URI, likely viral etiology. Discussed that  antibiotics are not indicated for viral infections. Pt will be discharged with symptomatic treatment.  Verbalizes understanding and is agreeable with plan. Pt is hemodynamically stable & in NAD prior to dc.     Montine Circle, PA-C 10/04/14 2346  Quintella Reichert, MD 10/04/14 [phone]

## 2014-10-04 NOTE — Discharge Instructions (Signed)
Upper Respiratory Infection, Adult An upper respiratory infection (URI) is also sometimes known as the common cold. The upper respiratory tract includes the nose, sinuses, throat, trachea, and bronchi. Bronchi are the airways leading to the lungs. Most people improve within 1 week, but symptoms can last up to 2 weeks. A residual cough may last even longer.  CAUSES Many different viruses can infect the tissues lining the upper respiratory tract. The tissues become irritated and inflamed and often become very moist. Mucus production is also common. A cold is contagious. You can easily spread the virus to others by oral contact. This includes kissing, sharing a glass, coughing, or sneezing. Touching your mouth or nose and then touching a surface, which is then touched by another person, can also spread the virus. SYMPTOMS  Symptoms typically develop 1 to 3 days after you come in contact with a cold virus. Symptoms vary from person to person. They may include:  Runny nose.  Sneezing.  Nasal congestion.  Sinus irritation.  Sore throat.  Loss of voice (laryngitis).  Cough.  Fatigue.  Muscle aches.  Loss of appetite.  Headache.  Low-grade fever. DIAGNOSIS  You might diagnose your own cold based on familiar symptoms, since most people get a cold 2 to 3 times a year. Your caregiver can confirm this based on your exam. Most importantly, your caregiver can check that your symptoms are not due to another disease such as strep throat, sinusitis, pneumonia, asthma, or epiglottitis. Blood tests, throat tests, and X-rays are not necessary to diagnose a common cold, but they may sometimes be helpful in excluding other more serious diseases. Your caregiver will decide if any further tests are required. RISKS AND COMPLICATIONS  You may be at risk for a more severe case of the common cold if you smoke cigarettes, have chronic heart disease (such as heart failure) or lung disease (such as asthma), or if  you have a weakened immune system. The very young and very old are also at risk for more serious infections. Bacterial sinusitis, middle ear infections, and bacterial pneumonia can complicate the common cold. The common cold can worsen asthma and chronic obstructive pulmonary disease (COPD). Sometimes, these complications can require emergency medical care and may be life-threatening. PREVENTION  The best way to protect against getting a cold is to practice good hygiene. Avoid oral or hand contact with people with cold symptoms. Wash your hands often if contact occurs. There is no clear evidence that vitamin C, vitamin E, echinacea, or exercise reduces the chance of developing a cold. However, it is always recommended to get plenty of rest and practice good nutrition. TREATMENT  Treatment is directed at relieving symptoms. There is no cure. Antibiotics are not effective, because the infection is caused by a virus, not by bacteria. Treatment may include:  Increased fluid intake. Sports drinks offer valuable electrolytes, sugars, and fluids.  Breathing heated mist or steam (vaporizer or shower).  Eating chicken soup or other clear broths, and maintaining good nutrition.  Getting plenty of rest.  Using gargles or lozenges for comfort.  Controlling fevers with ibuprofen or acetaminophen as directed by your caregiver.  Increasing usage of your inhaler if you have asthma. Zinc gel and zinc lozenges, taken in the first 24 hours of the common cold, can shorten the duration and lessen the severity of symptoms. Pain medicines may help with fever, muscle aches, and throat pain. A variety of non-prescription medicines are available to treat congestion and runny nose. Your caregiver   can make recommendations and may suggest nasal or lung inhalers for other symptoms.  HOME CARE INSTRUCTIONS   Only take over-the-counter or prescription medicines for pain, discomfort, or fever as directed by your  caregiver.  Use a warm mist humidifier or inhale steam from a shower to increase air moisture. This may keep secretions moist and make it easier to breathe.  Drink enough water and fluids to keep your urine clear or pale yellow.  Rest as needed.  Return to work when your temperature has returned to normal or as your caregiver advises. You may need to stay home longer to avoid infecting others. You can also use a face mask and careful hand washing to prevent spread of the virus. SEEK MEDICAL CARE IF:   After the first few days, you feel you are getting worse rather than better.  You need your caregiver's advice about medicines to control symptoms.  You develop chills, worsening shortness of breath, or brown or red sputum. These may be signs of pneumonia.  You develop yellow or brown nasal discharge or pain in the face, especially when you bend forward. These may be signs of sinusitis.  You develop a fever, swollen neck glands, pain with swallowing, or white areas in the back of your throat. These may be signs of strep throat. SEEK IMMEDIATE MEDICAL CARE IF:   You have a fever.  You develop severe or persistent headache, ear pain, sinus pain, or chest pain.  You develop wheezing, a prolonged cough, cough up blood, or have a change in your usual mucus (if you have chronic lung disease).  You develop sore muscles or a stiff neck. Document Released: 03/17/2001 Document Revised: 12/14/2011 Document Reviewed: 12/27/2013 Exit[employer] Patient Information 2015 [employer]. This information is not intended to replace advice given to you by your health care provider. Make sure you discuss any questions you have with your health care provider.  

## 2014-11-12 ENCOUNTER — Encounter: Payer: Self-pay | Admitting: Family Medicine

## 2014-11-12 ENCOUNTER — Ambulatory Visit (INDEPENDENT_AMBULATORY_CARE_PROVIDER_SITE_OTHER): Payer: Self-pay | Admitting: Family Medicine

## 2014-11-12 ENCOUNTER — Ambulatory Visit (AMBULATORY_SURGERY_CENTER): Payer: Self-pay | Admitting: *Deleted

## 2014-11-12 VITALS — BP 128/78 | HR 86 | Wt 150.0 lb

## 2014-11-12 VITALS — Ht 66.25 in | Wt 149.0 lb

## 2014-11-12 DIAGNOSIS — Z1211 Encounter for screening for malignant neoplasm of colon: Secondary | ICD-10-CM

## 2014-11-12 DIAGNOSIS — M5030 Other cervical disc degeneration, unspecified cervical region: Secondary | ICD-10-CM

## 2014-11-12 MED ORDER — MOVIPREP 100 G PO SOLR: ORAL | Status: DC
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
## 2014-11-12 NOTE — Patient Instructions (Addendum)
Good to see you Lauren Orozco is your friend.  Try the pennsaid twice daily as needed Hit the gym but avoid overhead activity.  Your labs look great! See me again in 6 weeks to make sure you are doing well.
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
## 2014-11-21 ENCOUNTER — Ambulatory Visit (AMBULATORY_SURGERY_CENTER): Payer: Medicare Other | Admitting: Internal Medicine

## 2014-11-21 ENCOUNTER — Encounter: Payer: Self-pay | Admitting: Internal Medicine

## 2014-11-21 VITALS — BP 139/85 | HR 76 | Temp 97.4°F | Resp 20 | Ht 66.0 in | Wt 149.0 lb

## 2014-11-21 DIAGNOSIS — Z1211 Encounter for screening for malignant neoplasm of colon: Secondary | ICD-10-CM | POA: Diagnosis not present

## 2014-11-21 DIAGNOSIS — K635 Polyp of colon: Secondary | ICD-10-CM

## 2014-11-21 DIAGNOSIS — D127 Benign neoplasm of rectosigmoid junction: Secondary | ICD-10-CM

## 2014-11-21 MED ORDER — SODIUM CHLORIDE 0.9 % IV SOLN: 500.0000 mL | INTRAVENOUS | Status: DC

## 2014-11-21 NOTE — Progress Notes (Signed)
Called to room to assist during endoscopic procedure.  Patient ID and intended procedure confirmed with present staff. Received instructions for my participation in the procedure from the performing physician.  

## 2014-11-21 NOTE — Patient Instructions (Addendum)
YOU HAD AN ENDOSCOPIC PROCEDURE TODAY AT THE [facility]: Refer to the procedure report that was given to you for any specific questions about what was found during the examination.  If the procedure report does not answer your questions, please call your gastroenterologist to clarify.  If you requested that your care partner not be given the details of your procedure findings, then the procedure report has been included in a sealed envelope for you to review at your convenience later.  YOU SHOULD EXPECT: Some feelings of bloating in the abdomen. Passage of more gas than usual.  Walking can help get rid of the air that was put into your GI tract during the procedure and reduce the bloating. If you had a lower endoscopy (such as a colonoscopy or flexible sigmoidoscopy) you may notice spotting of blood in your stool or on the toilet paper. If you underwent a bowel prep for your procedure, then you may not have a normal bowel movement for a few days.  DIET: Your first meal following the procedure should be a light meal and then it is ok to progress to your normal diet.  A half-sandwich or bowl of soup is an example of a good first meal.  Heavy or fried foods are harder to digest and may make you feel nauseous or bloated.  Likewise meals heavy in dairy and vegetables can cause extra gas to form and this can also increase the bloating.  Drink plenty of fluids but you should avoid alcoholic beverages for 24 hours.  ACTIVITY: Your care partner should take you home directly after the procedure.  You should plan to take it easy, moving slowly for the rest of the day.  You can resume normal activity the day after the procedure however you should NOT DRIVE or use heavy machinery for 24 hours (because of the sedation medicines used during the test).    SYMPTOMS TO REPORT IMMEDIATELY: A gastroenterologist can be reached at any hour.  During normal business hours, 8:30 AM to 5:00 PM Monday through Friday,  call [phone].  After hours and on weekends, please call the GI answering service at [phone] who will take a message and have the physician on call contact you.   Following lower endoscopy (colonoscopy or flexible sigmoidoscopy):  Excessive amounts of blood in the stool  Significant tenderness or worsening of abdominal pains  Swelling of the abdomen that is new, acute  Fever of 100F or higher  FOLLOW UP: If any biopsies were taken you will be contacted by phone or by letter within the next 1-3 weeks.  Call your gastroenterologist if you have not heard about the biopsies in 3 weeks.  Our staff will call the home number listed on your records the next business day following your procedure to check on you and address any questions or concerns that you may have at that time regarding the information given to you following your procedure. This is a courtesy call and so if there is no answer at the home number and we have not heard from you through the emergency physician on call, we will assume that you have returned to your regular daily activities without incident.  SIGNATURES/CONFIDENTIALITY: You and/or your care partner have signed paperwork which will be entered into your electronic medical record.  These signatures attest to the fact that that the information above on your After Visit Summary has been reviewed and is understood.  Full responsibility of the confidentiality of this   discharge information lies with you and/or your care-partner.  Recommendations Discharge instructions provided to patient and/or care partner. High fiber diet and polyp handout provided. Await pathology results.

## 2014-11-21 NOTE — Progress Notes (Signed)
A/ox3, pleased with MAC, report to RN 

## 2014-11-21 NOTE — Op Note (Signed)
[place]  [address] [place], [id number]   COLONOSCOPY PROCEDURE REPORT  PATIENT: Lauren Orozco, Lauren Orozco  MR#: [id number] BIRTHDATE: 07-21-1956 , 58  yrs. old GENDER: female ENDOSCOPIST: Lafayette Dragon, MD REFERRED LX:[id number] Evalina Field, M.D. PROCEDURE DATE:  11/21/2014 PROCEDURE:   Colonoscopy with cold biopsy polypectomy and Colonoscopy with snare polypectomy First Screening Colonoscopy - Avg.  risk and is 50 yrs.  old or older Yes.  Prior Negative Screening - Now for repeat screening. N/A  History of Adenoma - Now for follow-up colonoscopy & has been > or = to 3 yrs.  N/A  Polyps Removed Today? Yes. ASA CLASS:   Class I INDICATIONS:average risk patient for colon cancer. MEDICATIONS: Monitored anesthesia care and Propofol 400 mg IV  DESCRIPTION OF PROCEDURE:   After the risks benefits and alternatives of the procedure were thoroughly explained, informed consent was obtained.  The digital rectal exam revealed no abnormalities of the rectum.   The LB PCF Q180 [id number]  endoscope was introduced through the anus and advanced to the cecum, which was identified by both the appendix and ileocecal valve. No adverse events experienced.   The quality of the prep was good, using MoviPrep  The instrument was then slowly withdrawn as the colon was fully examined.      COLON FINDINGS: Eight smooth sessile polyps measuring 4 mm in size were found in the sigmoid colon.  A polypectomy was performed with cold forceps.  The resection was complete, the polyp tissue was completely retrieved and sent to histology.  A polypectomy was performed with a cold snare.  The resection was complete, the polyp tissue was completely retrieved and sent to histology.  Retroflexed views revealed no abnormalities. The time to cecum=7 minutes 19 seconds.  Withdrawal time=17 minutes 07 seconds.  The scope was withdrawn and the procedure completed. COMPLICATIONS: There were no immediate  complications.  ENDOSCOPIC IMPRESSION: Eight sessile polyps were found in the sigmoid colon; polypectomy was performed with cold forceps; polypectomy was performed with a cold snare ,polyps appeared diminutive or hyperplastic  RECOMMENDATIONS: 1.  Await pathology results 2.  High fiber diet Recall colonoscopy pending path report  eSigned:  Lafayette Dragon, MD 11/21/2014 10:36 AM   cc:   PATIENT NAME:  Lauren Orozco, Lauren Orozco MR#: [id number]

## 2014-11-22 ENCOUNTER — Telehealth: Payer: Self-pay | Admitting: *Deleted

## 2014-11-22 NOTE — Telephone Encounter (Signed)
  Follow up Call-  Call back number 11/21/2014  Post procedure Call Back phone  # [phone]  Permission to leave phone message Yes     Patient questions:  Do you have a fever, pain , or abdominal swelling? No. Pain Score  0 *  Have you tolerated food without any problems? Yes.    Have you been able to return to your normal activities? Yes.    Do you have any questions about your discharge instructions: Diet   No. Medications  No. Follow up visit  No.  Do you have questions or concerns about your Care? No.  Actions: * If pain score is 4 or above: No action needed, pain <4.

## 2014-11-26 ENCOUNTER — Encounter: Payer: Self-pay | Admitting: Internal Medicine

## 2014-11-28 LAB — HM COLONOSCOPY: HM Colonoscopy: 8

## 2014-12-18 ENCOUNTER — Other Ambulatory Visit (HOSPITAL_BASED_OUTPATIENT_CLINIC_OR_DEPARTMENT_OTHER): Payer: Medicare Other

## 2014-12-18 ENCOUNTER — Other Ambulatory Visit (HOSPITAL_COMMUNITY)
Admission: RE | Admit: 2014-12-18 | Discharge: 2014-12-18 | Disposition: A | Discharge status: Home / Self Care | Payer: Medicare Other | Source: Ambulatory Visit | Attending: Hematology and Oncology | Admitting: Hematology and Oncology

## 2014-12-18 DIAGNOSIS — D72820 Lymphocytosis (symptomatic): Secondary | ICD-10-CM | POA: Diagnosis not present

## 2014-12-18 LAB — COMPREHENSIVE METABOLIC PANEL (CC13)
ALT: 12 U/L (ref 0–55)
AST: 12 U/L (ref 5–34)
Albumin: 3.9 g/dL (ref 3.5–5.0)
Alkaline Phosphatase: 69 U/L (ref 40–150)
Anion Gap: 10 mEq/L (ref 3–11)
BUN: 11.4 mg/dL (ref 7.0–26.0)
CALCIUM: 9.1 mg/dL (ref 8.4–10.4)
CHLORIDE: 107 meq/L (ref 98–109)
CO2: 25 mEq/L (ref 22–29)
Creatinine: 0.8 mg/dL (ref 0.6–1.1)
EGFR: >90 mL/min/{1.73_m2} (ref >90)
GLUCOSE: 109 mg/dL (ref 70–140)
POTASSIUM: 4.1 meq/L (ref 3.5–5.1)
SODIUM: 142 meq/L (ref 136–145)
TOTAL PROTEIN: 7.1 g/dL (ref 6.4–8.3)
Total Bilirubin: 0.27 mg/dL (ref 0.20–1.20)

## 2014-12-18 LAB — CBC WITH DIFFERENTIAL/PLATELET
BASO%: 0.4 % (ref 0.0–2.0)
Basophils Absolute: 0 10*3/uL (ref 0.0–0.1)
EOS%: 1.6 % (ref 0.0–7.0)
Eosinophils Absolute: 0.1 10*3/uL (ref 0.0–0.5)
HEMATOCRIT: 39 % (ref 34.8–46.6)
HEMOGLOBIN: 13.3 g/dL (ref 11.6–15.9)
LYMPH#: 2.8 10*3/uL (ref 0.9–3.3)
LYMPH%: 61.6 % — ABNORMAL HIGH (ref 14.0–49.7)
MCH: 30.2 pg (ref 25.1–34.0)
MCHC: 34.1 g/dL (ref 31.5–36.0)
MCV: 88.6 fL (ref 79.5–101.0)
MONO#: 0.5 10*3/uL (ref 0.1–0.9)
MONO%: 11.4 % (ref 0.0–14.0)
NEUT#: 1.1 10*3/uL — ABNORMAL LOW (ref 1.5–6.5)
NEUT%: 25 % — ABNORMAL LOW (ref 38.4–76.8)
PLATELETS: 352 10*3/uL (ref 145–400)
RBC: 4.4 10*6/uL (ref 3.70–5.45)
RDW: 13.4 % (ref 11.2–14.5)
WBC: 4.5 10*3/uL (ref 3.9–10.3)

## 2014-12-18 LAB — LACTATE DEHYDROGENASE (CC13): LDH: 174 U/L (ref 125–245)

## 2014-12-20 LAB — FLOW CYTOMETRY

## 2014-12-25 ENCOUNTER — Telehealth: Payer: Self-pay | Admitting: Hematology and Oncology

## 2014-12-25 ENCOUNTER — Ambulatory Visit (HOSPITAL_BASED_OUTPATIENT_CLINIC_OR_DEPARTMENT_OTHER): Payer: Medicare Other | Admitting: Hematology and Oncology

## 2014-12-25 ENCOUNTER — Encounter: Payer: Self-pay | Admitting: Hematology and Oncology

## 2014-12-25 VITALS — BP 132/79 | HR 79 | Temp 98.1°F | Resp 19 | Ht 66.0 in | Wt 150.1 lb

## 2014-12-25 DIAGNOSIS — Z720 Tobacco use: Secondary | ICD-10-CM

## 2014-12-25 DIAGNOSIS — R079 Chest pain, unspecified: Secondary | ICD-10-CM

## 2014-12-25 DIAGNOSIS — C8590 Non-Hodgkin lymphoma, unspecified, unspecified site: Secondary | ICD-10-CM | POA: Diagnosis not present

## 2014-12-25 DIAGNOSIS — D72820 Lymphocytosis (symptomatic): Secondary | ICD-10-CM

## 2014-12-25 DIAGNOSIS — R0789 Other chest pain: Secondary | ICD-10-CM | POA: Insufficient documentation

## 2014-12-25 HISTORY — DX: Non-Hodgkin lymphoma, unspecified, unspecified site: C85.90

## 2014-12-25 NOTE — Assessment & Plan Note (Signed)
She has nonspecific chest pain. The patient is a chronic smoker and would qualify for CT chest screening for lung cancer.  in addition, I felt that the CT scan of the chest would be a good screening modality to look for lymphoma as a potential cause of her chest pain.

## 2014-12-25 NOTE — Progress Notes (Signed)
[facility] OFFICE PROGRESS NOTE  Patient Care Team: Janith Lima, MD as PCP - General (Internal Medicine)  SUMMARY OF ONCOLOGIC HISTORY:   NHL (non-Hodgkin's lymphoma)   12/18/2014 Pathology Results Accession: [id number] Flow cytometry showed NHL    INTERVAL HISTORY: Please see below for problem oriented charting. She returns today for further follow-up. She comes plain off intermittent chest discomfort especially with deep inspiration and when she lie down. Denies recent cough. No signs and symptoms of recent infection. Denies new lymphadenopathy. No abnormal night sweats or change in appetite  REVIEW OF SYSTEMS:   Constitutional: Denies fevers, chills or abnormal weight loss Eyes: Denies blurriness of vision Ears, nose, mouth, throat, and face: Denies mucositis or sore throat Respiratory: Denies cough, dyspnea or wheezes Cardiovascular: Denies palpitation,or lower extremity swelling Gastrointestinal:  Denies nausea, heartburn or change in bowel habits Skin: Denies abnormal skin rashes Lymphatics: Denies new lymphadenopathy or easy bruising Neurological:Denies numbness, tingling or new weaknesses Behavioral/Psych: Mood is stable, no new changes  All other systems were reviewed with the patient and are negative.  I have reviewed the past medical history, past surgical history, social history and family history with the patient and they are unchanged from previous note.  ALLERGIES:  is allergic to ciprofloxacin; ibuprofen; ofloxacin; and other.  MEDICATIONS:  Current Outpatient Prescriptions  Medication Sig Dispense Refill  . acetaminophen (TYLENOL) 325 MG tablet Take 650 mg by mouth every 6 (six) hours as needed.     No current facility-administered medications for this visit.    PHYSICAL EXAMINATION: ECOG PERFORMANCE STATUS: 1 - Symptomatic but completely ambulatory  Filed Vitals:   12/25/14 1112  BP: 132/79  Pulse: 79  Temp: 98.1 F (36.7 C)  Resp:  19   Filed Weights   12/25/14 1112  Weight: 150 lb 1.6 oz (68.085 kg)    GENERAL:alert, no distress and comfortable SKIN: skin color, texture, turgor are normal, no rashes or significant lesions EYES: normal, Conjunctiva are pink and non-injected, sclera clear OROPHARYNX:no exudate, no erythema and lips, buccal mucosa, and tongue normal  NECK: supple, thyroid normal size, non-tender, without nodularity LYMPH:  no palpable lymphadenopathy in the cervical, axillary or inguinal LUNGS: clear to auscultation and percussion with normal breathing effort HEART: regular rate & rhythm and no murmurs and no lower extremity edema ABDOMEN:abdomen soft, non-tender and normal bowel sounds Musculoskeletal:no cyanosis of digits and no clubbing  NEURO: alert & oriented x 3 with fluent speech, no focal motor/sensory deficits  LABORATORY DATA:  I have reviewed the data as listed    Component Value Date/Time   NA 142 12/18/2014 1112   NA 141 10/04/2014 2216   K 4.1 12/18/2014 1112   K 3.5 10/04/2014 2216   CL 103 10/04/2014 2216   CO2 25 12/18/2014 1112   CO2 27 08/24/2014 1422   GLUCOSE 109 12/18/2014 1112   GLUCOSE 137* 10/04/2014 2216   BUN 11.4 12/18/2014 1112   BUN 9 10/04/2014 2216   CREATININE 0.8 12/18/2014 1112   CREATININE 0.90 10/04/2014 2216   CALCIUM 9.1 12/18/2014 1112   CALCIUM 9.8 08/24/2014 1422   PROT 7.1 12/18/2014 1112   PROT 7.9 08/24/2014 1422   ALBUMIN 3.9 12/18/2014 1112   ALBUMIN 4.8 08/24/2014 1422   AST 12 12/18/2014 1112   AST 17 08/24/2014 1422   ALT 12 12/18/2014 1112   ALT 18 08/24/2014 1422   ALKPHOS 69 12/18/2014 1112   ALKPHOS 73 08/24/2014 1422   BILITOT 0.27 12/18/2014 1112  BILITOT 0.6 08/24/2014 1422   GFRNONAA >90 03/05/2014 1750   GFRAA >90 03/05/2014 1750    No results found for: SPEP, UPEP  Lab Results  Component Value Date   WBC 4.5 12/18/2014   NEUTROABS 1.1* 12/18/2014   HGB 13.3 12/18/2014   HCT 39.0 12/18/2014   MCV 88.6  12/18/2014   PLT 352 12/18/2014      Chemistry      Component Value Date/Time   NA 142 12/18/2014 1112   NA 141 10/04/2014 2216   K 4.1 12/18/2014 1112   K 3.5 10/04/2014 2216   CL 103 10/04/2014 2216   CO2 25 12/18/2014 1112   CO2 27 08/24/2014 1422   BUN 11.4 12/18/2014 1112   BUN 9 10/04/2014 2216   CREATININE 0.8 12/18/2014 1112   CREATININE 0.90 10/04/2014 2216      Component Value Date/Time   CALCIUM 9.1 12/18/2014 1112   CALCIUM 9.8 08/24/2014 1422   ALKPHOS 69 12/18/2014 1112   ALKPHOS 73 08/24/2014 1422   AST 12 12/18/2014 1112   AST 17 08/24/2014 1422   ALT 12 12/18/2014 1112   ALT 18 08/24/2014 1422   BILITOT 0.27 12/18/2014 1112   BILITOT 0.6 08/24/2014 1422      ASSESSMENT & PLAN:  NHL (non-Hodgkin's lymphoma) Clinically, she has early-stage disease. No palpable lymphadenopathy of the symptoms. The patient would be at risk for secondary malignancy. I will see her on a regular basis. I recommend she quit smoking entirely. She will be coming here once or twice a year with observation and blood work.   Tobacco abuse I spent some time counseling the patient the importance of tobacco cessation. she is currently attempting to quit on her own  I gave her patient education handout and encouraged her to sign up for smoking cessation class.    Chest discomfort She has nonspecific chest pain. The patient is a chronic smoker and would qualify for CT chest screening for lung cancer.  in addition, I felt that the CT scan of the chest would be a good screening modality to look for lymphoma as a potential cause of her chest pain.    Orders Placed This Encounter  Procedures  . CT Chest Wo Contrast    Standing Status: Future     Number of Occurrences:      Standing Expiration Date: 02/24/2016    Order Specific Question:  Reason for Exam (SYMPTOM  OR DIAGNOSIS REQUIRED)    Answer:  smoker, cough, chest pain, exclude lung ca, lymphocytosis, exclude lymphoma.     Order Specific Question:  Is the patient pregnant?    Answer:  No    Order Specific Question:  Preferred imaging location?    Answer:  [facility]   All questions were answered. The patient knows to call the clinic with any problems, questions or concerns. No barriers to learning was detected. I spent 25 minutes counseling the patient face to face. The total time spent in the appointment was 30 minutes and more than 50% was on counseling and review of test results     [facility], [place], MD 12/25/2014 12:23 PM

## 2014-12-25 NOTE — Telephone Encounter (Signed)
gave and printed appt sched and avs for pt for March

## 2014-12-25 NOTE — Assessment & Plan Note (Signed)
Clinically, she has early-stage disease. No palpable lymphadenopathy of the symptoms. The patient would be at risk for secondary malignancy. I will see her on a regular basis. I recommend she quit smoking entirely. She will be coming here once or twice a year with observation and blood work.

## 2014-12-25 NOTE — Assessment & Plan Note (Signed)
I spent some time counseling the patient the importance of tobacco cessation. she is currently attempting to quit on her own  I gave her patient education handout and encouraged her to sign up for smoking cessation class.  

## 2015-01-01 ENCOUNTER — Ambulatory Visit (HOSPITAL_COMMUNITY)
Admission: RE | Admit: 2015-01-01 | Discharge: 2015-01-01 | Disposition: A | Discharge status: Home / Self Care | Payer: Medicare Other | Source: Ambulatory Visit | Attending: Hematology and Oncology | Admitting: Hematology and Oncology

## 2015-01-01 DIAGNOSIS — R05 Cough: Secondary | ICD-10-CM | POA: Insufficient documentation

## 2015-01-01 DIAGNOSIS — R079 Chest pain, unspecified: Secondary | ICD-10-CM | POA: Insufficient documentation

## 2015-01-01 DIAGNOSIS — Z720 Tobacco use: Secondary | ICD-10-CM

## 2015-01-01 DIAGNOSIS — D72820 Lymphocytosis (symptomatic): Secondary | ICD-10-CM

## 2015-01-01 DIAGNOSIS — C8590 Non-Hodgkin lymphoma, unspecified, unspecified site: Secondary | ICD-10-CM | POA: Diagnosis not present

## 2015-01-01 DIAGNOSIS — D3501 Benign neoplasm of right adrenal gland: Secondary | ICD-10-CM | POA: Diagnosis not present

## 2015-01-02 ENCOUNTER — Encounter: Payer: Self-pay | Admitting: Hematology and Oncology

## 2015-01-02 ENCOUNTER — Ambulatory Visit (HOSPITAL_BASED_OUTPATIENT_CLINIC_OR_DEPARTMENT_OTHER): Payer: Medicare Other | Admitting: Hematology and Oncology

## 2015-01-02 ENCOUNTER — Telehealth: Payer: Self-pay | Admitting: Hematology and Oncology

## 2015-01-02 VITALS — BP 149/85 | HR 84 | Temp 98.6°F | Resp 18 | Ht 66.0 in | Wt 148.0 lb

## 2015-01-02 DIAGNOSIS — Z720 Tobacco use: Secondary | ICD-10-CM

## 2015-01-02 DIAGNOSIS — C8590 Non-Hodgkin lymphoma, unspecified, unspecified site: Secondary | ICD-10-CM

## 2015-01-02 DIAGNOSIS — R079 Chest pain, unspecified: Secondary | ICD-10-CM | POA: Diagnosis not present

## 2015-01-02 DIAGNOSIS — R0789 Other chest pain: Secondary | ICD-10-CM

## 2015-01-02 NOTE — Assessment & Plan Note (Signed)
I spent some time counseling the patient the importance of tobacco cessation. she is currently attempting to quit on her own  I gave her patient education handout and encouraged her to sign up for smoking cessation class.  

## 2015-01-02 NOTE — Telephone Encounter (Signed)
lvm for pt regarding to march 2017 appt....mailed pt appt sched/avs and letter

## 2015-01-02 NOTE — Progress Notes (Signed)
[facility] OFFICE PROGRESS NOTE  Patient Care Team: Janith Lima, MD as PCP - General (Internal Medicine)  SUMMARY OF ONCOLOGIC HISTORY:   NHL (non-Hodgkin's lymphoma)   12/18/2014 Pathology Results Accession: [id number] Flow cytometry showed NHL   01/01/2015 Imaging CT scan of the chest was performed due to chest pressure and it came back negative.    INTERVAL HISTORY: Please see below for problem oriented charting. She returns for further follow-up. She continued to have nonspecific chest discomfort.  REVIEW OF SYSTEMS:   Constitutional: Denies fevers, chills or abnormal weight loss Eyes: Denies blurriness of vision Ears, nose, mouth, throat, and face: Denies mucositis or sore throat Respiratory: Denies cough, dyspnea or wheezes Cardiovascular: Denies palpitation, chest discomfort or lower extremity swelling Gastrointestinal:  Denies nausea, heartburn or change in bowel habits Skin: Denies abnormal skin rashes Lymphatics: Denies new lymphadenopathy or easy bruising Neurological:Denies numbness, tingling or new weaknesses Behavioral/Psych: Mood is stable, no new changes  All other systems were reviewed with the patient and are negative.  I have reviewed the past medical history, past surgical history, social history and family history with the patient and they are unchanged from previous note.  ALLERGIES:  is allergic to ciprofloxacin; ibuprofen; ofloxacin; and other.  MEDICATIONS:  Current Outpatient Prescriptions  Medication Sig Dispense Refill  . acetaminophen (TYLENOL) 325 MG tablet Take 650 mg by mouth every 6 (six) hours as needed.     No current facility-administered medications for this visit.    PHYSICAL EXAMINATION: ECOG PERFORMANCE STATUS: 0 - Asymptomatic  Filed Vitals:   01/02/15 1106  BP: 149/85  Pulse: 84  Temp: 98.6 F (37 C)  Resp: 18   Filed Weights   01/02/15 1106  Weight: 148 lb (67.132 kg)    GENERAL:alert, no distress and  comfortable SKIN: skin color, texture, turgor are normal, no rashes or significant lesions EYES: normal, Conjunctiva are pink and non-injected, sclera clear Musculoskeletal:no cyanosis of digits and no clubbing  NEURO: alert & oriented x 3 with fluent speech, no focal motor/sensory deficits  LABORATORY DATA:  I have reviewed the data as listed    Component Value Date/Time   NA 142 12/18/2014 1112   NA 141 10/04/2014 2216   K 4.1 12/18/2014 1112   K 3.5 10/04/2014 2216   CL 103 10/04/2014 2216   CO2 25 12/18/2014 1112   CO2 27 08/24/2014 1422   GLUCOSE 109 12/18/2014 1112   GLUCOSE 137* 10/04/2014 2216   BUN 11.4 12/18/2014 1112   BUN 9 10/04/2014 2216   CREATININE 0.8 12/18/2014 1112   CREATININE 0.90 10/04/2014 2216   CALCIUM 9.1 12/18/2014 1112   CALCIUM 9.8 08/24/2014 1422   PROT 7.1 12/18/2014 1112   PROT 7.9 08/24/2014 1422   ALBUMIN 3.9 12/18/2014 1112   ALBUMIN 4.8 08/24/2014 1422   AST 12 12/18/2014 1112   AST 17 08/24/2014 1422   ALT 12 12/18/2014 1112   ALT 18 08/24/2014 1422   ALKPHOS 69 12/18/2014 1112   ALKPHOS 73 08/24/2014 1422   BILITOT 0.27 12/18/2014 1112   BILITOT 0.6 08/24/2014 1422   GFRNONAA >90 03/05/2014 1750   GFRAA >90 03/05/2014 1750    No results found for: SPEP, UPEP  Lab Results  Component Value Date   WBC 4.5 12/18/2014   NEUTROABS 1.1* 12/18/2014   HGB 13.3 12/18/2014   HCT 39.0 12/18/2014   MCV 88.6 12/18/2014   PLT 352 12/18/2014      Chemistry  Component Value Date/Time   NA 142 12/18/2014 1112   NA 141 10/04/2014 2216   K 4.1 12/18/2014 1112   K 3.5 10/04/2014 2216   CL 103 10/04/2014 2216   CO2 25 12/18/2014 1112   CO2 27 08/24/2014 1422   BUN 11.4 12/18/2014 1112   BUN 9 10/04/2014 2216   CREATININE 0.8 12/18/2014 1112   CREATININE 0.90 10/04/2014 2216      Component Value Date/Time   CALCIUM 9.1 12/18/2014 1112   CALCIUM 9.8 08/24/2014 1422   ALKPHOS 69 12/18/2014 1112   ALKPHOS 73 08/24/2014 1422    AST 12 12/18/2014 1112   AST 17 08/24/2014 1422   ALT 12 12/18/2014 1112   ALT 18 08/24/2014 1422   BILITOT 0.27 12/18/2014 1112   BILITOT 0.6 08/24/2014 1422       RADIOGRAPHIC STUDIES: I have personally reviewed the radiological images as listed and agreed with the findings in the report. Ct Chest Wo Contrast  01/01/2015   CLINICAL DATA:  New diagnosis of non-Hodgkin's lymphoma in March 2016. Chest pain and cough.  EXAM: CT CHEST WITHOUT CONTRAST  TECHNIQUE: Multidetector CT imaging of the chest was performed following the standard protocol without IV contrast.  COMPARISON:  None.  FINDINGS: Chest wall: No breast masses, supraclavicular or axillary lymphadenopathy. Small scattered lymph nodes are noted. Thyroid gland is grossly normal. The bony thorax is intact. No destructive bone lesions or spinal canal compromise. Multilevel Schmorl's nodes are noted. Cervical spine fusion hardware is noted.  Mediastinum: The heart is normal in size. No pericardial effusion. No mediastinal or hilar mass or adenopathy. Minimal thymic tissue noted in the anterior mediastinum. The aorta is normal in caliber. The esophagus is grossly normal.  Lungs/pleura: No acute pulmonary findings. No pulmonary lesions. No bronchiectasis or interstitial lung disease.  Upper abdomen: A right adrenal gland adenoma is noted. No worrisome upper abdominal findings otherwise.  IMPRESSION: Unremarkable chest CT.  No adenopathy or acute pulmonary findings.   Electronically Signed   By: Marijo Sanes M.D.   On: 01/01/2015 17:06     ASSESSMENT & PLAN:  NHL (non-Hodgkin's lymphoma) Clinically, she has early-stage disease. No palpable lymphadenopathy of the symptoms. I recommend she quit smoking entirely. She will be coming here once a year with observation and blood work.     Chest discomfort She has nonspecific chest pain. The patient is a chronic smoker. Thankfully, CT scan showed no evidence of cancer. I reassured the patient  that this is nonspecific chest discomfort.   Tobacco abuse I spent some time counseling the patient the importance of tobacco cessation. she is currently attempting to quit on her own  I gave her patient education handout and encouraged her to sign up for smoking cessation class.      Orders Placed This Encounter  Procedures  . CBC with Differential/Platelet    Standing Status: Future     Number of Occurrences:      Standing Expiration Date: 02/06/2016  . Comprehensive metabolic panel    Standing Status: Future     Number of Occurrences:      Standing Expiration Date: 02/06/2016  . Lactate dehydrogenase    Standing Status: Future     Number of Occurrences:      Standing Expiration Date: 02/06/2016   All questions were answered. The patient knows to call the clinic with any problems, questions or concerns. No barriers to learning was detected. I spent 15 minutes counseling the patient face to face.  The total time spent in the appointment was 20 minutes and more than 50% was on counseling and review of test results     [facility], [place], MD 01/02/2015 1:34 PM

## 2015-01-02 NOTE — Assessment & Plan Note (Signed)
She has nonspecific chest pain. The patient is a chronic smoker. Thankfully, CT scan showed no evidence of cancer. I reassured the patient that this is nonspecific chest discomfort.

## 2015-01-02 NOTE — Assessment & Plan Note (Signed)
Clinically, she has early-stage disease. No palpable lymphadenopathy of the symptoms. I recommend she quit smoking entirely. She will be coming here once a year with observation and blood work.  

## 2015-01-29 ENCOUNTER — Encounter: Payer: Self-pay | Admitting: Internal Medicine

## 2015-01-29 ENCOUNTER — Ambulatory Visit (INDEPENDENT_AMBULATORY_CARE_PROVIDER_SITE_OTHER): Payer: Medicare Other | Admitting: Internal Medicine

## 2015-01-29 VITALS — BP 128/86 | HR 83 | Temp 98.5°F | Resp 16 | Ht 66.0 in | Wt 151.0 lb

## 2015-01-29 DIAGNOSIS — C8590 Non-Hodgkin lymphoma, unspecified, unspecified site: Secondary | ICD-10-CM | POA: Diagnosis not present

## 2015-01-29 DIAGNOSIS — G5603 Carpal tunnel syndrome, bilateral upper limbs: Secondary | ICD-10-CM

## 2015-01-29 DIAGNOSIS — G5601 Carpal tunnel syndrome, right upper limb: Secondary | ICD-10-CM | POA: Diagnosis not present

## 2015-01-29 DIAGNOSIS — G5602 Carpal tunnel syndrome, left upper limb: Secondary | ICD-10-CM | POA: Diagnosis not present

## 2015-01-29 MED ORDER — IBUPROFEN 600 MG PO TABS: 600.0000 mg | ORAL_TABLET | Freq: Three times a day (TID) | ORAL | Status: DC | PRN

## 2015-01-29 NOTE — Progress Notes (Signed)
Subjective:    Patient ID: Lauren Orozco, female    DOB: [birth year], 59 y.o.   MRN: [id number]  HPI Comments: She complains of pain in both arms for about 12 years, she took ibuprofen previously and it controlled the pain but about 1 year ago she was told to stop taking ibuprofen because she has a hx of diverticulitis (I don't understand the concern). She describes that pain as diffuse burning that starts over the volar side of both wrists and travels up, she reports numbness in both arms. She has C-spine surgery many years ago and tells me that the sx did not help her pain.     Review of Systems  Constitutional: Positive for fatigue. Negative for fever, chills, diaphoresis and appetite change.  HENT: Negative.   Eyes: Negative.   Respiratory: Negative.   Cardiovascular: Negative.  Negative for chest pain, palpitations and leg swelling.  Gastrointestinal: Negative.  Negative for nausea, vomiting, abdominal pain, diarrhea, constipation and blood in stool.  Endocrine: Negative.   Genitourinary: Negative.   Musculoskeletal: Positive for myalgias, arthralgias and neck pain. Negative for back pain, joint swelling and gait problem.  Skin: Negative.  Negative for rash.  Allergic/Immunologic: Negative.   Neurological: Positive for numbness. Negative for dizziness, tremors and facial asymmetry.  Hematological: Negative.  Negative for adenopathy. Does not bruise/bleed easily.  Psychiatric/Behavioral: Negative.        Objective:   Physical Exam  Constitutional: She is oriented to person, place, and time. She appears well-developed and well-nourished. No distress.  HENT:  Head: Normocephalic and atraumatic.  Mouth/Throat: Oropharynx is clear and moist. No oropharyngeal exudate.  Eyes: Conjunctivae are normal. Right eye exhibits no discharge. Left eye exhibits no discharge. No scleral icterus.  Neck: Normal range of motion. Neck supple. No JVD present. No tracheal deviation present.  No thyromegaly present.  Cardiovascular: Normal rate, regular rhythm and intact distal pulses.  Exam reveals no gallop and no friction rub.   No murmur heard. Pulmonary/Chest: Effort normal and breath sounds normal. No stridor. No respiratory distress. She has no wheezes. She has no rales. She exhibits no tenderness.  Abdominal: Soft. Bowel sounds are normal. She exhibits no distension and no mass. There is no tenderness. There is no rebound and no guarding.  Musculoskeletal: Normal range of motion. She exhibits no edema or tenderness.       Right elbow: Normal.      Left elbow: Normal.       Right wrist: Normal.       Left wrist: Normal.       Cervical back: Normal. She exhibits normal range of motion, no tenderness, no bony tenderness, no swelling, no edema, no deformity, no laceration, no pain, no spasm and normal pulse.  + phalen's and tinel's tests in B UE  Lymphadenopathy:    She has no cervical adenopathy.  Neurological: She is oriented to person, place, and time. She has normal strength. She displays no atrophy, no tremor and normal reflexes. No cranial nerve deficit or sensory deficit. She exhibits normal muscle tone. She displays a negative Romberg sign. She displays no seizure activity. Coordination and gait normal.  Reflex Scores:      Tricep reflexes are 1+ on the right side and 1+ on the left side.      Bicep reflexes are 1+ on the right side and 1+ on the left side.      Brachioradialis reflexes are 1+ on the right side and 1+ on the  left side.      Patellar reflexes are 1+ on the right side and 1+ on the left side.      Achilles reflexes are 1+ on the right side and 1+ on the left side. Skin: Skin is warm and dry. No rash noted. She is not diaphoretic. No erythema. No pallor.  Psychiatric: She has a normal mood and affect. Her behavior is normal. Judgment and thought content normal.  Vitals reviewed.    Lab Results  Component Value Date   WBC 4.5 12/18/2014   HGB 13.3  12/18/2014   HCT 39.0 12/18/2014   PLT 352 12/18/2014   GLUCOSE 109 12/18/2014   CHOL 231* 08/24/2014   TRIG 79.0 08/24/2014   HDL 59.30 08/24/2014   LDLDIRECT 150.8 10/19/2011   LDLCALC 156* 08/24/2014   ALT 12 12/18/2014   AST 12 12/18/2014   NA 142 12/18/2014   K 4.1 12/18/2014   CL 103 10/04/2014   CREATININE 0.8 12/18/2014   BUN 11.4 12/18/2014   CO2 25 12/18/2014   TSH 1.12 08/24/2014       Assessment & Plan:

## 2015-01-29 NOTE — Assessment & Plan Note (Signed)
She asks for a referral to [facility] for a second opinion

## 2015-01-29 NOTE — Progress Notes (Signed)
Pre visit review using our clinic review tool, if applicable. No additional management support is needed unless otherwise documented below in the visit note. 

## 2015-01-29 NOTE — Patient Instructions (Signed)
Carpal Tunnel Syndrome The carpal tunnel is a narrow area located on the palm side of your wrist. The tunnel is formed by the wrist bones and ligaments. Nerves, blood vessels, and tendons pass through the carpal tunnel. Repeated wrist motion or certain diseases may cause swelling within the tunnel. This swelling pinches the main nerve in the wrist (median nerve) and causes the painful hand and arm condition called carpal tunnel syndrome. CAUSES   Repeated wrist motions.  Wrist injuries.  Certain diseases like arthritis, diabetes, alcoholism, hyperthyroidism, and kidney failure.  Obesity.  Pregnancy. SYMPTOMS   A "pins and needles" feeling in your fingers or hand, especially in your thumb, index and middle fingers.  Tingling or numbness in your fingers or hand.  An aching feeling in your entire arm, especially when your wrist and elbow are bent for long periods of time.  Wrist pain that goes up your arm to your shoulder.  Pain that goes down into your palm or fingers.  A weak feeling in your hands. DIAGNOSIS  Your health care provider will take your history and perform a physical exam. An electromyography test may be needed. This test measures electrical signals sent out by your nerves into the muscles. The electrical signals are usually slowed by carpal tunnel syndrome. You may also need X-rays. TREATMENT  Carpal tunnel syndrome may clear up by itself. Your health care provider may recommend a wrist splint or medicine such as a nonsteroidal anti-inflammatory medicine. Cortisone injections may help. Sometimes, surgery may be needed to free the pinched nerve.  HOME CARE INSTRUCTIONS   Take all medicine as directed by your health care provider. Only take over-the-counter or prescription medicines for pain, discomfort, or fever as directed by your health care provider.  If you were given a splint to keep your wrist from bending, wear it as directed. It is important to wear the splint at  night. Wear the splint for as long as you have pain or numbness in your hand, arm, or wrist. This may take 1 to 2 months.  Rest your wrist from any activity that may be causing your pain. If your symptoms are work-related, you may need to talk to your employer about changing to a job that does not require using your wrist.  Put ice on your wrist after long periods of wrist activity.  Put ice in a plastic bag.  Place a towel between your skin and the bag.  Leave the ice on for 15-20 minutes, 03-04 times a day.  Keep all follow-up visits as directed by your health care provider. This includes any orthopedic referrals, physical therapy, and rehabilitation. Any delay in getting necessary care could result in a delay or failure of your condition to heal. SEEK IMMEDIATE MEDICAL CARE IF:   You have new, unexplained symptoms.  Your symptoms get worse and are not helped or controlled with medicines. MAKE SURE YOU:   Understand these instructions.  Will watch your condition.  Will get help right away if you are not doing well or get worse. Document Released: 09/18/2000 Document Revised: 02/05/2014 Document Reviewed: 08/07/2011 Exit[employer] Patient Information 2015 [employer]. This information is not intended to replace advice given to you by your health care provider. Make sure you discuss any questions you have with your health care provider.  

## 2015-01-29 NOTE — Assessment & Plan Note (Signed)
She will restart ibuprofen I have asked her to have NCS/EMG done of the UE's to confirm the diagnosis and to gauge the severity

## 2015-02-01 ENCOUNTER — Telehealth: Payer: Self-pay | Admitting: Hematology and Oncology

## 2015-02-01 NOTE — Telephone Encounter (Signed)
Faxed pt medical records to [facility]. Ordered cd to be fedex'ed

## 2015-02-04 ENCOUNTER — Ambulatory Visit: Payer: Medicare Other | Admitting: Hematology and Oncology

## 2015-02-04 ENCOUNTER — Other Ambulatory Visit: Payer: Self-pay | Admitting: *Deleted

## 2015-02-04 DIAGNOSIS — G5603 Carpal tunnel syndrome, bilateral upper limbs: Secondary | ICD-10-CM

## 2015-02-06 DIAGNOSIS — D127 Benign neoplasm of rectosigmoid junction: Secondary | ICD-10-CM | POA: Diagnosis not present

## 2015-02-26 ENCOUNTER — Telehealth: Payer: Self-pay

## 2015-02-26 DIAGNOSIS — Z1231 Encounter for screening mammogram for malignant neoplasm of breast: Secondary | ICD-10-CM

## 2015-02-26 NOTE — Telephone Encounter (Signed)
Called patient to see if a recent mammogram has been done. LMOVM to call back to inform if it has been done or if ok to order one.

## 2015-02-26 NOTE — Addendum Note (Signed)
Addended by: Estell Harpin T on: 02/26/2015 03:29 PM   Modules accepted: Orders

## 2015-02-26 NOTE — Telephone Encounter (Signed)
Spoke with patient who has not had a recent scan but states ok to order.

## 2015-02-28 ENCOUNTER — Encounter: Payer: Medicare Other | Admitting: Neurology

## 2015-03-05 ENCOUNTER — Ambulatory Visit (INDEPENDENT_AMBULATORY_CARE_PROVIDER_SITE_OTHER): Payer: Medicare Other | Admitting: Neurology

## 2015-03-05 DIAGNOSIS — G5602 Carpal tunnel syndrome, left upper limb: Secondary | ICD-10-CM

## 2015-03-05 DIAGNOSIS — G5601 Carpal tunnel syndrome, right upper limb: Secondary | ICD-10-CM | POA: Diagnosis not present

## 2015-03-05 DIAGNOSIS — G5603 Carpal tunnel syndrome, bilateral upper limbs: Secondary | ICD-10-CM

## 2015-03-05 NOTE — Procedures (Signed)
[facility]  [address] [place]  [place], [place] [address] Tel: [phone] Fax:  [phone] Test Date:  03/05/2015  Patient: Lauren Orozco DOB: 09-27-1956 Physician: Narda Amber, DO  Sex: Female Height: 5\' 6"  Ref Phys: Scarlette Calico, M.D.  ID#: [id number] Temp: 34.0C Technician: Jerilynn Mages. Dean   Patient Complaints: This is a 59 year old female with bilateral hand numbness and tingling a number of year.  She has had previous cervical fusion in 2009 at C3 to C7.  NCV & EMG Findings: Extensive electrodiagnostic testing of the right upper extremity and additional studies of the left shows:  1. Bilateral median, ulnar, radial, and palmar sensory responses are within normal limits.  2. Bilateral median and ulnar motor responses are within normal limits.  3. There is no evidence of active or chronic motor axonal loss changes affecting any of the tested muscles. Motor unit configuration and recruitment pattern is within normal limits.  Impression: This is a normal study of the upper extremities.  In particular, there is no evidence of carpal tunnel syndrome, ulnar neuropathy, or a cervical radiculopathy.   ___________________________ Narda Amber, DO    Nerve Conduction Studies Anti Sensory Summary Table   Site NR Peak (ms) Norm Peak (ms) P-T Amp (V) Norm P-T Amp  Left Median Anti Sensory (2nd Digit)  Wrist    3.4 <3.6 29.0 >15  Right Median Anti Sensory (2nd Digit)  Wrist    3.4 <3.6 22.6 >15  Left Radial Anti Sensory (Base 1st Digit)  Wrist    2.1 <2.7 39.9 >14  Right Radial Anti Sensory (Base 1st Digit)  Wrist    1.9 <2.7 48.4 >14  Left Ulnar Anti Sensory (5th Digit)  Wrist    3.1 <3.1 33.8 >10  Right Ulnar Anti Sensory (5th Digit)  Wrist    2.8 <3.1 28.9 >10   Motor Summary Table   Site NR Onset (ms) Norm Onset (ms) O-P Amp (mV) Norm O-P Amp Site1 Site2 Delta-0 (ms) Dist (cm) Vel (m/s) Norm Vel (m/s)  Left Median Motor (Abd Poll Brev)  Wrist    2.9  <4.0 9.7 >6 Elbow Wrist 4.1 24.0 59 >50  Elbow    7.0  9.0         Right Median Motor (Abd Poll Brev)  Wrist    3.4 <4.0 7.2 >6 Elbow Wrist 4.2 23.0 55 >50  Elbow    7.6  6.9         Left Ulnar Motor (Abd Dig Minimi)  Wrist    2.2 <3.1 7.4 >7 B Elbow Wrist 3.7 20.0 54 >50  B Elbow    5.9  6.9  A Elbow B Elbow 1.6 10.0 63 >50  A Elbow    7.5  6.8         Right Ulnar Motor (Abd Dig Minimi)  Wrist    2.3 <3.1 7.7 >7 B Elbow Wrist 3.8 21.0 55 >50  B Elbow    6.1  7.4  A Elbow B Elbow 1.6 10.0 62 >50  A Elbow    7.7  7.5          Comparison Summary Table   Site NR Peak (ms) Norm Peak (ms) P-T Amp (V) Site1 Site2 Delta-P (ms) Norm Delta (ms)  Left Median/Ulnar Palm Comparison (Wrist - 8cm)  Median Palm    2.0 <2.2 38.2 Median Palm Ulnar Palm 0.1   Ulnar Palm    1.9 <2.2 19.1  Right Median/Ulnar Palm Comparison (Wrist - 8cm)  Median Palm    2.2 <2.2 30.1 Median Palm Ulnar Palm 0.1   Ulnar Palm    2.1 <2.2 22.0       EMG   Side Muscle Ins Act Fibs Psw Fasc Number Recrt Dur Dur. Amp Amp. Poly Poly. Comment  Right 1stDorInt Nml Nml Nml Nml Nml Nml Nml Nml Nml Nml Nml Nml N/A  Right Ext Indicis Nml Nml Nml Nml Nml Nml Nml Nml Nml Nml Nml Nml N/A  Right PronatorTeres Nml Nml Nml Nml Nml Nml Nml Nml Nml Nml Nml Nml N/A  Right Triceps Nml Nml Nml Nml Nml Nml Nml Nml Nml Nml Nml Nml N/A  Right Biceps Nml Nml Nml Nml Nml Nml Nml Nml Nml Nml Nml Nml N/A  Right Deltoid Nml Nml Nml Nml Nml Nml Nml Nml Nml Nml Nml Nml N/A  Left 1stDorInt Nml Nml Nml Nml Nml Nml Nml Nml Nml Nml Nml Nml N/A  Left Ext Indicis Nml Nml Nml Nml Nml Nml Nml Nml Nml Nml Nml Nml N/A  Left PronatorTeres Nml Nml Nml Nml Nml Nml Nml Nml Nml Nml Nml Nml N/A  Left Biceps Nml Nml Nml Nml Nml Nml Nml Nml Nml Nml Nml Nml N/A  Left Triceps Nml Nml Nml Nml Nml Nml Nml Nml Nml Nml Nml Nml N/A  Left Deltoid Nml Nml Nml Nml Nml Nml Nml Nml Nml Nml Nml Nml N/A      Waveforms:

## 2015-05-15 ENCOUNTER — Ambulatory Visit: Payer: Medicare Other | Admitting: Internal Medicine

## 2015-05-15 DIAGNOSIS — R4689 Other symptoms and signs involving appearance and behavior: Secondary | ICD-10-CM | POA: Insufficient documentation

## 2015-06-14 ENCOUNTER — Ambulatory Visit (INDEPENDENT_AMBULATORY_CARE_PROVIDER_SITE_OTHER): Payer: Medicare Other | Admitting: Internal Medicine

## 2015-06-14 ENCOUNTER — Encounter: Payer: Self-pay | Admitting: Internal Medicine

## 2015-06-14 ENCOUNTER — Telehealth: Payer: Self-pay

## 2015-06-14 VITALS — BP 148/88 | HR 81 | Temp 97.9°F | Resp 16 | Ht 66.0 in | Wt 147.0 lb

## 2015-06-14 DIAGNOSIS — L739 Follicular disorder, unspecified: Secondary | ICD-10-CM

## 2015-06-14 DIAGNOSIS — Z1231 Encounter for screening mammogram for malignant neoplasm of breast: Secondary | ICD-10-CM | POA: Diagnosis not present

## 2015-06-14 DIAGNOSIS — J209 Acute bronchitis, unspecified: Secondary | ICD-10-CM

## 2015-06-14 DIAGNOSIS — R030 Elevated blood-pressure reading, without diagnosis of hypertension: Secondary | ICD-10-CM | POA: Diagnosis not present

## 2015-06-14 DIAGNOSIS — C8590 Non-Hodgkin lymphoma, unspecified, unspecified site: Secondary | ICD-10-CM | POA: Diagnosis not present

## 2015-06-14 MED ORDER — AZITHROMYCIN 250 MG PO TABS: ORAL_TABLET | ORAL | Status: DC

## 2015-06-14 MED ORDER — MUPIROCIN 2 % EX OINT: TOPICAL_OINTMENT | CUTANEOUS | Status: DC

## 2015-06-14 MED ORDER — HYDROCODONE-HOMATROPINE 5-1.5 MG/5ML PO SYRP: 5.0000 mL | ORAL_SOLUTION | Freq: Four times a day (QID) | ORAL | Status: DC | PRN

## 2015-06-14 NOTE — Progress Notes (Signed)
   Subjective:    Patient ID: Lauren Orozco, female    DOB: 1956-07-25, 59 y.o.   MRN: [id number]  HPI She's had a cough for approximately one month.The cough has become productive of up to a half a cup of brown sputum per day. This is associated with shortness of breath. She's had some sore throat but no other upper respiratory tract infection symptoms  She has sweats at night. She also describes fatigue. She has taken a generic Robitussin type product.  Chest x-rays performed in June 2015 and December 2015 revealed no active disease.  She continues to smoke; she states she smokes less than 2 cigarettes per day.  She is followed by Dr Alvy Bimler for NHL every 6-12 months for "early stage disease" w/o palpable LA.  She has no PMH of hypertension and does not monitor BP @ home.   Review of Systems  She denies associated wheezing.  Frontal headache, facial pain , nasal purulence, dental pain,  otic pain or otic discharge denied. No fever or chills . She has had intermittent red bumps on her extremities in the last week. She questioned whether these were fleabites.     Objective:   Physical Exam  General appearance:Adequately nourished; no acute distress or increased work of breathing is present.    Lymphatic: No  lymphadenopathy about the head, neck, or axilla .  Eyes: No conjunctival inflammation or lid edema is present. There is no scleral icterus.  Ears:  External ear exam shows no significant lesions or deformities.  Otoscopic examination reveals clear canals, tympanic membranes are intact bilaterally without bulging, retraction, inflammation or discharge.  Nose:  External nasal examination shows no deformity or inflammation. Nasal mucosa are pink and moist without lesions or exudates No septal dislocation or deviation.No obstruction to airflow.   Oral exam: Dental hygiene is extremely poor with caries to and below the gumline and a few remaining teeth; lips and gums  are healthy appearing.There is no oropharyngeal erythema or exudate .  Neck:  No deformities, thyromegaly, masses, or tenderness noted.   Supple with full range of motion without pain.   Heart:  Normal rate and regular rhythm. S1 and S2 normal without gallop, murmur, click, rub or other extra sounds.   Lungs:Chest clear to auscultation; no wheezes, rhonchi,rales ,or rubs present. Breath sounds are decreased  Extremities:  No cyanosis, edema, or clubbing  noted   Skin: Warm & dry w/o tenting. She has a few scattered slightly raised red papules without pustule vesicle formation. The most notable was over the right shin.       Assessment & Plan:  #1 acute bronchitis w/o bronchospasm #2 probable folliculitis #3 asymptomatic NHL #4 elevated BP w/o diagnosis of HTN Plan: See orders and recommendations

## 2015-06-14 NOTE — Progress Notes (Signed)
Pre visit review using our clinic review tool, if applicable. No additional management support is needed unless otherwise documented below in the visit note. 

## 2015-06-14 NOTE — Telephone Encounter (Signed)
Spoke with pt regarding mammogram. Pt agrees, an order has been put in. Pt is aware someone will be contacting her to schedule visit.

## 2015-06-14 NOTE — Patient Instructions (Addendum)
Plain Mucinex (NOT D) for thick secretions ;force NON dairy fluids .   Marland KitchenCarry room temperature water and sip liberally after coughing. Minimal Blood Pressure Goal= AVERAGE < 140/90;  Ideal is an AVERAGE < 135/85. This AVERAGE should be calculated from @ least 5-7 BP readings taken @ different times of day on different days of week. You should not respond to isolated BP readings , but rather the AVERAGE for that week .Please bring your  blood pressure cuff to office visits to verify that it is reliable.It  can also be checked against the blood pressure device at the pharmacy. Finger or wrist cuffs are not dependable; an arm cuff is.

## 2015-11-12 ENCOUNTER — Ambulatory Visit
Admission: RE | Admit: 2015-11-12 | Discharge: 2015-11-12 | Disposition: A | Discharge status: Home / Self Care | Payer: Medicare Other | Source: Ambulatory Visit | Attending: Internal Medicine | Admitting: Internal Medicine

## 2015-11-12 DIAGNOSIS — Z1231 Encounter for screening mammogram for malignant neoplasm of breast: Secondary | ICD-10-CM

## 2015-11-13 LAB — HM MAMMOGRAPHY: HM Mammogram: ABNORMAL

## 2015-11-15 ENCOUNTER — Other Ambulatory Visit: Payer: Self-pay | Admitting: Internal Medicine

## 2015-11-15 DIAGNOSIS — R928 Other abnormal and inconclusive findings on diagnostic imaging of breast: Secondary | ICD-10-CM

## 2015-11-26 ENCOUNTER — Other Ambulatory Visit: Payer: Medicare Other

## 2015-12-02 ENCOUNTER — Ambulatory Visit
Admission: RE | Admit: 2015-12-02 | Discharge: 2015-12-02 | Disposition: A | Discharge status: Home / Self Care | Payer: Medicare Other | Source: Ambulatory Visit | Attending: Internal Medicine | Admitting: Internal Medicine

## 2015-12-02 ENCOUNTER — Other Ambulatory Visit: Payer: Self-pay | Admitting: Internal Medicine

## 2015-12-02 ENCOUNTER — Inpatient Hospital Stay: Admission: RE | Admit: 2015-12-02 | Payer: Medicare Other | Source: Ambulatory Visit

## 2015-12-02 DIAGNOSIS — R928 Other abnormal and inconclusive findings on diagnostic imaging of breast: Secondary | ICD-10-CM

## 2015-12-02 DIAGNOSIS — N6002 Solitary cyst of left breast: Secondary | ICD-10-CM | POA: Diagnosis not present

## 2015-12-02 DIAGNOSIS — N63 Unspecified lump in breast: Secondary | ICD-10-CM | POA: Diagnosis not present

## 2016-01-02 ENCOUNTER — Encounter: Payer: Self-pay | Admitting: Hematology and Oncology

## 2016-01-02 ENCOUNTER — Other Ambulatory Visit (HOSPITAL_BASED_OUTPATIENT_CLINIC_OR_DEPARTMENT_OTHER): Payer: Medicare Other

## 2016-01-02 ENCOUNTER — Ambulatory Visit (HOSPITAL_BASED_OUTPATIENT_CLINIC_OR_DEPARTMENT_OTHER): Payer: Medicare Other | Admitting: Hematology and Oncology

## 2016-01-02 VITALS — BP 158/90 | HR 73 | Temp 98.4°F | Resp 18 | Ht 66.0 in | Wt 150.8 lb

## 2016-01-02 DIAGNOSIS — C8590 Non-Hodgkin lymphoma, unspecified, unspecified site: Secondary | ICD-10-CM | POA: Diagnosis not present

## 2016-01-02 DIAGNOSIS — I10 Essential (primary) hypertension: Secondary | ICD-10-CM

## 2016-01-02 DIAGNOSIS — D72820 Lymphocytosis (symptomatic): Secondary | ICD-10-CM | POA: Insufficient documentation

## 2016-01-02 DIAGNOSIS — Z720 Tobacco use: Secondary | ICD-10-CM | POA: Diagnosis not present

## 2016-01-02 HISTORY — DX: Lymphocytosis (symptomatic): D72.820

## 2016-01-02 LAB — COMPREHENSIVE METABOLIC PANEL
ALT: 17 U/L (ref 0–55)
AST: 17 U/L (ref 5–34)
Albumin: 4.1 g/dL (ref 3.5–5.0)
Alkaline Phosphatase: 76 U/L (ref 40–150)
Anion Gap: 6 mEq/L (ref 3–11)
BUN: 9 mg/dL (ref 7.0–26.0)
CALCIUM: 9.6 mg/dL (ref 8.4–10.4)
CHLORIDE: 107 meq/L (ref 98–109)
CO2: 27 meq/L (ref 22–29)
Creatinine: 0.8 mg/dL (ref 0.6–1.1)
EGFR: >90 mL/min/{1.73_m2} (ref >90)
GLUCOSE: 99 mg/dL (ref 70–140)
POTASSIUM: 4 meq/L (ref 3.5–5.1)
SODIUM: 140 meq/L (ref 136–145)
Total Bilirubin: 0.45 mg/dL (ref 0.20–1.20)
Total Protein: 7.8 g/dL (ref 6.4–8.3)

## 2016-01-02 LAB — CBC WITH DIFFERENTIAL/PLATELET
BASO%: 1.1 % (ref 0.0–2.0)
Basophils Absolute: 0.1 10*3/uL (ref 0.0–0.1)
EOS%: 1.4 % (ref 0.0–7.0)
Eosinophils Absolute: 0.1 10*3/uL (ref 0.0–0.5)
HCT: 42.3 % (ref 34.8–46.6)
HGB: 13.7 g/dL (ref 11.6–15.9)
LYMPH%: 53.4 % — AB (ref 14.0–49.7)
MCH: 29.2 pg (ref 25.1–34.0)
MCHC: 32.5 g/dL (ref 31.5–36.0)
MCV: 90 fL (ref 79.5–101.0)
MONO#: 0.5 10*3/uL (ref 0.1–0.9)
MONO%: 10.8 % (ref 0.0–14.0)
NEUT#: 1.7 10*3/uL (ref 1.5–6.5)
NEUT%: 33.3 % — AB (ref 38.4–76.8)
Platelets: 366 10*3/uL (ref 145–400)
RBC: 4.7 10*6/uL (ref 3.70–5.45)
RDW: 13.6 % (ref 11.2–14.5)
WBC: 5.1 10*3/uL (ref 3.9–10.3)
lymph#: 2.7 10*3/uL (ref 0.9–3.3)

## 2016-01-02 LAB — LACTATE DEHYDROGENASE: LDH: 182 U/L (ref 125–245)

## 2016-01-02 NOTE — Assessment & Plan Note (Signed)
She is not on any antihypertensives. I have 2 documented high blood pressure values in my office. I recommend she discuss with her primary care doctor for possible treatment for hypertension

## 2016-01-02 NOTE — Assessment & Plan Note (Signed)
I spent some time counseling the patient the importance of tobacco cessation. she is currently attempting to quit on her own 

## 2016-01-02 NOTE — Assessment & Plan Note (Signed)
Clinically, she has early-stage disease. No palpable lymphadenopathy of the symptoms. I recommend she quit smoking entirely. She will be coming here once a year with observation and blood work.  

## 2016-01-02 NOTE — Progress Notes (Signed)
[facility] OFFICE PROGRESS NOTE  Patient Care Team: Janith Lima, MD as PCP - General (Internal Medicine)  SUMMARY OF ONCOLOGIC HISTORY:   Low grade lymphoma, stage I ([place])   12/18/2014 Pathology Results Accession: [id number] Flow cytometry showed NHL   01/01/2015 Imaging CT scan of the chest was performed due to chest pressure and it came back negative.    INTERVAL HISTORY: Please see below for problem oriented charting. She returns for further follow-up. Denies new lymphadenopathy. No recent infection. She is still smoking half a pack of cigarettes per day and is attempting to quit smoking on her own.  REVIEW OF SYSTEMS:   Constitutional: Denies fevers, chills or abnormal weight loss Eyes: Denies blurriness of vision Ears, nose, mouth, throat, and face: Denies mucositis or sore throat Respiratory: Denies cough, dyspnea or wheezes Cardiovascular: Denies palpitation, chest discomfort or lower extremity swelling Gastrointestinal:  Denies nausea, heartburn or change in bowel habits Skin: Denies abnormal skin rashes Lymphatics: Denies new lymphadenopathy or easy bruising Neurological:Denies numbness, tingling or new weaknesses Behavioral/Psych: Mood is stable, no new changes  All other systems were reviewed with the patient and are negative.  I have reviewed the past medical history, past surgical history, social history and family history with the patient and they are unchanged from previous note.  ALLERGIES:  is allergic to ciprofloxacin; ofloxacin; and other.  MEDICATIONS:  No current outpatient prescriptions on file.   No current facility-administered medications for this visit.    PHYSICAL EXAMINATION: ECOG PERFORMANCE STATUS: 0 - Asymptomatic  Filed Vitals:   01/02/16 1045  BP: 158/90  Pulse: 73  Temp: 98.4 F (36.9 C)  Resp: 18   Filed Weights   01/02/16 1045  Weight: 150 lb 12.8 oz (68.402 kg)    GENERAL:alert, no distress and  comfortable SKIN: skin color, texture, turgor are normal, no rashes or significant lesions EYES: normal, Conjunctiva are pink and non-injected, sclera clear OROPHARYNX:no exudate, no erythema and lips, buccal mucosa, and tongue normal  NECK: supple, thyroid normal size, non-tender, without nodularity LYMPH:  no palpable lymphadenopathy in the cervical, axillary or inguinal LUNGS: clear to auscultation and percussion with normal breathing effort HEART: regular rate & rhythm and no murmurs and no lower extremity edema ABDOMEN:abdomen soft, non-tender and normal bowel sounds Musculoskeletal:no cyanosis of digits and no clubbing  NEURO: alert & oriented x 3 with fluent speech, no focal motor/sensory deficits  LABORATORY DATA:  I have reviewed the data as listed    Component Value Date/Time   NA 142 12/18/2014 1112   NA 141 10/04/2014 2216   K 4.1 12/18/2014 1112   K 3.5 10/04/2014 2216   CL 103 10/04/2014 2216   CO2 25 12/18/2014 1112   CO2 27 08/24/2014 1422   GLUCOSE 109 12/18/2014 1112   GLUCOSE 137* 10/04/2014 2216   BUN 11.4 12/18/2014 1112   BUN 9 10/04/2014 2216   CREATININE 0.8 12/18/2014 1112   CREATININE 0.90 10/04/2014 2216   CALCIUM 9.1 12/18/2014 1112   CALCIUM 9.8 08/24/2014 1422   PROT 7.1 12/18/2014 1112   PROT 7.9 08/24/2014 1422   ALBUMIN 3.9 12/18/2014 1112   ALBUMIN 4.8 08/24/2014 1422   AST 12 12/18/2014 1112   AST 17 08/24/2014 1422   ALT 12 12/18/2014 1112   ALT 18 08/24/2014 1422   ALKPHOS 69 12/18/2014 1112   ALKPHOS 73 08/24/2014 1422   BILITOT 0.27 12/18/2014 1112   BILITOT 0.6 08/24/2014 1422   GFRNONAA >90 03/05/2014 1750  GFRAA >90 03/05/2014 1750    No results found for: SPEP, UPEP  Lab Results  Component Value Date   WBC 5.1 01/02/2016   NEUTROABS 1.7 01/02/2016   HGB 13.7 01/02/2016   HCT 42.3 01/02/2016   MCV 90.0 01/02/2016   PLT 366 01/02/2016      Chemistry      Component Value Date/Time   NA 142 12/18/2014 1112   NA 141  10/04/2014 2216   K 4.1 12/18/2014 1112   K 3.5 10/04/2014 2216   CL 103 10/04/2014 2216   CO2 25 12/18/2014 1112   CO2 27 08/24/2014 1422   BUN 11.4 12/18/2014 1112   BUN 9 10/04/2014 2216   CREATININE 0.8 12/18/2014 1112   CREATININE 0.90 10/04/2014 2216      Component Value Date/Time   CALCIUM 9.1 12/18/2014 1112   CALCIUM 9.8 08/24/2014 1422   ALKPHOS 69 12/18/2014 1112   ALKPHOS 73 08/24/2014 1422   AST 12 12/18/2014 1112   AST 17 08/24/2014 1422   ALT 12 12/18/2014 1112   ALT 18 08/24/2014 1422   BILITOT 0.27 12/18/2014 1112   BILITOT 0.6 08/24/2014 1422      ASSESSMENT & PLAN:  Low grade lymphoma, stage I ([facility]) Clinically, she has early-stage disease. No palpable lymphadenopathy of the symptoms. I recommend she quit smoking entirely. She will be coming here once a year with observation and blood work.   Tobacco abuse I spent some time counseling the patient the importance of tobacco cessation. she is currently attempting to quit on her own   Essential hypertension She is not on any antihypertensives. I have 2 documented high blood pressure values in my office. I recommend she discuss with her primary care doctor for possible treatment for hypertension   Orders Placed This Encounter  Procedures  . CBC with Differential/Platelet    Standing Status: Future     Number of Occurrences:      Standing Expiration Date: 02/05/2017   All questions were answered. The patient knows to call the clinic with any problems, questions or concerns. No barriers to learning was detected. I spent 15 minutes counseling the patient face to face. The total time spent in the appointment was 20 minutes and more than 50% was on counseling and review of test results     [facility], Schwanda Zima, MD 01/02/2016 11:12 AM
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
## 2016-07-02 ENCOUNTER — Encounter: Payer: Self-pay | Admitting: Internal Medicine

## 2016-07-08 ENCOUNTER — Other Ambulatory Visit (INDEPENDENT_AMBULATORY_CARE_PROVIDER_SITE_OTHER): Payer: Medicare Other

## 2016-07-08 ENCOUNTER — Encounter: Payer: Self-pay | Admitting: Internal Medicine

## 2016-07-08 ENCOUNTER — Ambulatory Visit (INDEPENDENT_AMBULATORY_CARE_PROVIDER_SITE_OTHER): Payer: Medicare Other | Admitting: Internal Medicine

## 2016-07-08 VITALS — BP 150/90 | HR 77 | Temp 98.6°F | Resp 16 | Ht 66.0 in | Wt 153.2 lb

## 2016-07-08 DIAGNOSIS — L081 Erythrasma: Secondary | ICD-10-CM | POA: Insufficient documentation

## 2016-07-08 DIAGNOSIS — I10 Essential (primary) hypertension: Secondary | ICD-10-CM

## 2016-07-08 DIAGNOSIS — M5030 Other cervical disc degeneration, unspecified cervical region: Secondary | ICD-10-CM

## 2016-07-08 DIAGNOSIS — M159 Polyosteoarthritis, unspecified: Secondary | ICD-10-CM

## 2016-07-08 DIAGNOSIS — C8590 Non-Hodgkin lymphoma, unspecified, unspecified site: Secondary | ICD-10-CM

## 2016-07-08 DIAGNOSIS — B356 Tinea cruris: Secondary | ICD-10-CM

## 2016-07-08 LAB — CBC WITH DIFFERENTIAL/PLATELET
BASOS ABS: 0 10*3/uL (ref 0.0–0.1)
Basophils Relative: 0.5 % (ref 0.0–3.0)
EOS ABS: 0.1 10*3/uL (ref 0.0–0.7)
Eosinophils Relative: 0.9 % (ref 0.0–5.0)
HCT: 41 % (ref 36.0–46.0)
Hemoglobin: 13.8 g/dL (ref 12.0–15.0)
LYMPHS ABS: 3.3 10*3/uL (ref 0.7–4.0)
Lymphocytes Relative: 50.8 % — ABNORMAL HIGH (ref 12.0–46.0)
MCHC: 33.7 g/dL (ref 30.0–36.0)
MCV: 89.1 fl (ref 78.0–100.0)
MONO ABS: 0.6 10*3/uL (ref 0.1–1.0)
MONOS PCT: 9.3 % (ref 3.0–12.0)
NEUTROS ABS: 2.5 10*3/uL (ref 1.4–7.7)
NEUTROS PCT: 38.5 % — AB (ref 43.0–77.0)
PLATELETS: 378 10*3/uL (ref 150.0–400.0)
RBC: 4.6 Mil/uL (ref 3.87–5.11)
RDW: 14.1 % (ref 11.5–15.5)
WBC: 6.4 10*3/uL (ref 4.0–10.5)

## 2016-07-08 LAB — COMPREHENSIVE METABOLIC PANEL
ALK PHOS: 70 U/L (ref 39–117)
ALT: 14 U/L (ref 0–35)
AST: 14 U/L (ref 0–37)
Albumin: 4.4 g/dL (ref 3.5–5.2)
BILIRUBIN TOTAL: 0.4 mg/dL (ref 0.2–1.2)
BUN: 10 mg/dL (ref 6–23)
CO2: 29 meq/L (ref 19–32)
Calcium: 9.4 mg/dL (ref 8.4–10.5)
Chloride: 104 mEq/L (ref 96–112)
Creatinine, Ser: 0.75 mg/dL (ref 0.40–1.20)
GFR: 101.37 mL/min (ref >60.00)
GLUCOSE: 107 mg/dL — AB (ref 70–99)
Potassium: 4.1 mEq/L (ref 3.5–5.1)
SODIUM: 140 meq/L (ref 135–145)
TOTAL PROTEIN: 7.9 g/dL (ref 6.0–8.3)

## 2016-07-08 LAB — TSH: TSH: 0.71 u[IU]/mL (ref 0.35–4.50)

## 2016-07-08 MED ORDER — TAPENTADOL HCL ER 50 MG PO TB12: 50.0000 mg | ORAL_TABLET | Freq: Two times a day (BID) | ORAL | 0 refills | Status: DC

## 2016-07-08 MED ORDER — KETOCONAZOLE 200 MG PO TABS: 200.0000 mg | ORAL_TABLET | Freq: Every day | ORAL | 0 refills | Status: AC

## 2016-07-08 MED ORDER — IBUPROFEN 600 MG PO TABS: 600.0000 mg | ORAL_TABLET | Freq: Three times a day (TID) | ORAL | 4 refills | Status: DC | PRN

## 2016-07-08 NOTE — Patient Instructions (Signed)

## 2016-07-08 NOTE — Progress Notes (Signed)
Subjective:  Patient ID: Lauren Orozco, female    DOB: 29-Dec-1955  Age: 60 y.o. MRN: [id number]  CC: Rash and Osteoarthritis   HPI Lauren Orozco presents for complaints of rash in her groin and widespread arthritis pain.   She tells me that years ago she thought she had fibromyalgia so she saw a rheumatologist at [facility] and was told that she did not have fibromyalgia or rheumatological disease. She complains of pain in her large joints, symmetrically, for several years. She tells me the pain interferes with her activities during the day. She has tried Tylenol and over-the-counter doses of ibuprofen for symptom relief without much relief from her symptoms. She also complains of chronic neck pain and tells me that she has had prior cervical spine surgery. She describes an intermittent sharp pain in the right side of her neck that radiates to the top of right shoulder. She denies paresthesias in her arms or legs.  She also complains of an itchy rash in both groin area.  No outpatient prescriptions prior to visit.   No facility-administered medications prior to visit.     ROS Review of Systems  Constitutional: Negative.  Negative for appetite change, chills, diaphoresis, fatigue and fever.  HENT: Negative.   Eyes: Negative.   Respiratory: Negative.  Negative for cough, choking, chest tightness, shortness of breath and stridor.   Cardiovascular: Negative.  Negative for chest pain, palpitations and leg swelling.  Gastrointestinal: Negative.  Negative for abdominal pain, constipation, diarrhea, nausea and vomiting.  Endocrine: Negative.   Genitourinary: Negative.  Negative for difficulty urinating.  Musculoskeletal: Positive for arthralgias and neck pain. Negative for back pain, gait problem, joint swelling and myalgias.  Skin: Positive for rash. Negative for color change, pallor and wound.  Allergic/Immunologic: Negative.   Neurological: Negative.   Negative for dizziness.  Hematological: Negative.  Negative for adenopathy. Does not bruise/bleed easily.  Psychiatric/Behavioral: Negative.  Negative for decreased concentration, dysphoric mood and sleep disturbance. The patient is not nervous/anxious.     Objective:  BP (!) 150/90 (BP Location: Left Arm, Patient Position: Sitting, Cuff Size: Normal)   Pulse 77   Temp 98.6 F (37 C) (Oral)   Resp 16   Ht 5\' 6"  (1.676 m)   Wt 153 lb 4 oz (69.5 kg)   SpO2 97%   BMI 24.74 kg/m   BP Readings from Last 3 Encounters:  07/08/16 (!) 150/90  01/02/16 (!) 158/90  06/14/15 (!) 148/88    Wt Readings from Last 3 Encounters:  07/08/16 153 lb 4 oz (69.5 kg)  01/02/16 150 lb 12.8 oz (68.4 kg)  06/14/15 147 lb (66.7 kg)    Physical Exam  Constitutional: She is oriented to person, place, and time. No distress.  HENT:  Mouth/Throat: Oropharynx is clear and moist. No oropharyngeal exudate.  Eyes: Conjunctivae are normal. Right eye exhibits no discharge. Left eye exhibits no discharge. No scleral icterus.  Neck: Normal range of motion. Neck supple. No JVD present. No tracheal deviation present. No thyromegaly present.  Cardiovascular: Normal rate, regular rhythm, normal heart sounds and intact distal pulses.  Exam reveals no gallop and no friction rub.   No murmur heard. Pulmonary/Chest: Effort normal and breath sounds normal. No stridor. No respiratory distress. She has no wheezes. She has no rales. She exhibits no tenderness.  Abdominal: Soft. Bowel sounds are normal. She exhibits no distension and no mass. There is no tenderness. There is no rebound and no guarding.  Musculoskeletal:  Normal range of motion. She exhibits no edema, tenderness or deformity.  Lymphadenopathy:    She has no cervical adenopathy.  Neurological: She is oriented to person, place, and time.  Skin: Skin is warm and dry. No rash noted. She is not diaphoretic. No erythema. No pallor.  In the groin, symmetrically and  bilaterally in the intertriginous region there is erythema and scale with very sharp borders.  Psychiatric: She has a normal mood and affect. Her behavior is normal. Judgment and thought content normal.  Vitals reviewed.   Lab Results  Component Value Date   WBC 6.4 07/08/2016   HGB 13.8 07/08/2016   HCT 41.0 07/08/2016   PLT 378.0 07/08/2016   GLUCOSE 107 (H) 07/08/2016   CHOL 231 (H) 08/24/2014   TRIG 79.0 08/24/2014   HDL 59.30 08/24/2014   LDLDIRECT 150.8 10/19/2011   LDLCALC 156 (H) 08/24/2014   ALT 14 07/08/2016   AST 14 07/08/2016   NA 140 07/08/2016   K 4.1 07/08/2016   CL 104 07/08/2016   CREATININE 0.75 07/08/2016   BUN 10 07/08/2016   CO2 29 07/08/2016   TSH 0.71 07/08/2016    US Breast Ltd Uni Left Inc Axilla  Result Date: 12/02/2015 CLINICAL DATA:  Patient was called back from screening mammogram for possible mass in the left breast. EXAM: DIGITAL DIAGNOSTIC LEFT MAMMOGRAM WITH 3D TOMOSYNTHESIS WITH CAD ULTRASOUND LEFT BREAST COMPARISON:  Previous exam(s). ACR Breast Density Category b: There are scattered areas of fibroglandular density. FINDINGS: Additional imaging of the left breast was performed. There is persistence of a 7 mm mass in the lower-inner quadrant of the breast. There are no malignant type microcalcifications. Mammographic images were processed with CAD. On physical exam, I do not palpate a mass in the lower-inner quadrant of the left breast. Targeted ultrasound is performed, showing a well-circumscribed anechoic cyst in the left breast at 7 o'clock 3 cm from the nipple measuring 6 x 7 x 6 mm. IMPRESSION: Left breast cyst.  No evidence of malignancy. RECOMMENDATION: Bilateral screening mammogram in 1 year is recommended. I have discussed the findings and recommendations with the patient. Results were also provided in writing at the conclusion of the visit. If applicable, a reminder letter will be sent to the patient regarding the next appointment. BI-RADS  CATEGORY  2: Benign. Electronically Signed   By: Lillia Mountain M.D.   On: 12/02/2015 15:15   Mm Diag Breast Tomo Uni Left  Result Date: 12/02/2015 CLINICAL DATA:  Patient was called back from screening mammogram for possible mass in the left breast. EXAM: DIGITAL DIAGNOSTIC LEFT MAMMOGRAM WITH 3D TOMOSYNTHESIS WITH CAD ULTRASOUND LEFT BREAST COMPARISON:  Previous exam(s). ACR Breast Density Category b: There are scattered areas of fibroglandular density. FINDINGS: Additional imaging of the left breast was performed. There is persistence of a 7 mm mass in the lower-inner quadrant of the breast. There are no malignant type microcalcifications. Mammographic images were processed with CAD. On physical exam, I do not palpate a mass in the lower-inner quadrant of the left breast. Targeted ultrasound is performed, showing a well-circumscribed anechoic cyst in the left breast at 7 o'clock 3 cm from the nipple measuring 6 x 7 x 6 mm. IMPRESSION: Left breast cyst.  No evidence of malignancy. RECOMMENDATION: Bilateral screening mammogram in 1 year is recommended. I have discussed the findings and recommendations with the patient. Results were also provided in writing at the conclusion of the visit. If applicable, a reminder letter will be sent  to the patient regarding the next appointment. BI-RADS CATEGORY  2: Benign. Electronically Signed   By: Lillia Mountain M.D.   On: 12/02/2015 15:15    Assessment & Plan:   Garda was seen today for rash and osteoarthritis.  Diagnoses and all orders for this visit:  Essential hypertension- Her blood pressures adequately well-controlled, electrolytes and renal function are stable. -     Comprehensive metabolic panel; Future -     TSH; Future  Low grade lymphoma, stage I ([place])- her CBC today does not show any evidence of recurrence and she has no symptoms of lymphoma -     CBC with Differential/Platelet; Future -     Comprehensive metabolic panel; Future  DISC DISEASE,  CERVICAL -     ibuprofen (ADVIL,MOTRIN) 600 MG tablet; Take 1 tablet (600 mg total) by mouth every 8 (eight) hours as needed. -     tapentadol (NUCYNTA ER) 50 MG 12 hr tablet; Take 1 tablet (50 mg total) by mouth every 12 (twelve) hours.  Generalized OA -     ibuprofen (ADVIL,MOTRIN) 600 MG tablet; Take 1 tablet (600 mg total) by mouth every 8 (eight) hours as needed. -     tapentadol (NUCYNTA ER) 50 MG 12 hr tablet; Take 1 tablet (50 mg total) by mouth every 12 (twelve) hours.  Tinea cruris -     ketoconazole (NIZORAL) 200 MG tablet; Take 1 tablet (200 mg total) by mouth daily.   I am having Ms. Standard start on ketoconazole, ibuprofen, and tapentadol.  Meds ordered this encounter  Medications  . ketoconazole (NIZORAL) 200 MG tablet    Sig: Take 1 tablet (200 mg total) by mouth daily.    Dispense:  14 tablet    Refill:  0  . ibuprofen (ADVIL,MOTRIN) 600 MG tablet    Sig: Take 1 tablet (600 mg total) by mouth every 8 (eight) hours as needed.    Dispense:  90 tablet    Refill:  4  . tapentadol (NUCYNTA ER) 50 MG 12 hr tablet    Sig: Take 1 tablet (50 mg total) by mouth every 12 (twelve) hours.    Dispense:  60 tablet    Refill:  0     Follow-up: Return in about 4 weeks (around 08/05/2016).  Scarlette Calico, MD

## 2016-07-08 NOTE — Progress Notes (Signed)
Pre visit review using our clinic review tool, if applicable. No additional management support is needed unless otherwise documented below in the visit note. 

## 2016-07-24 ENCOUNTER — Telehealth: Payer: Self-pay

## 2016-07-24 NOTE — Telephone Encounter (Signed)
PA initiated via CoverMyMeds key [id number]

## 2016-07-27 NOTE — Telephone Encounter (Signed)
PA DENIED, please advise. Thanks!

## 2016-08-21 ENCOUNTER — Telehealth: Payer: Self-pay | Admitting: Internal Medicine

## 2016-08-21 NOTE — Telephone Encounter (Signed)
Patient has sent a mychart appt request.  Stating that she has finished all her antibiotic but is still having discomfort and discoloration.  She would like to know if she needs to do anything else.  Please follow up with patient in regard.

## 2016-08-24 NOTE — Telephone Encounter (Signed)
Acute scheduled with PCP for Wednesday.

## 2016-08-24 NOTE — Telephone Encounter (Signed)
Ask her to come in for a recheck I have not seen her for a cough

## 2016-08-24 NOTE — Telephone Encounter (Signed)
Please advise. Pt finished abx and is still having discomfort and phlegm.

## 2016-08-26 ENCOUNTER — Encounter: Payer: Self-pay | Admitting: Internal Medicine

## 2016-08-26 ENCOUNTER — Ambulatory Visit (INDEPENDENT_AMBULATORY_CARE_PROVIDER_SITE_OTHER): Payer: Medicare Other | Admitting: Internal Medicine

## 2016-08-26 ENCOUNTER — Ambulatory Visit (INDEPENDENT_AMBULATORY_CARE_PROVIDER_SITE_OTHER)
Admission: RE | Admit: 2016-08-26 | Discharge: 2016-08-26 | Disposition: A | Discharge status: Home / Self Care | Payer: Medicare Other | Source: Ambulatory Visit | Attending: Internal Medicine | Admitting: Internal Medicine

## 2016-08-26 VITALS — BP 160/98 | HR 74 | Temp 98.9°F | Resp 16 | Ht 66.0 in | Wt 156.2 lb

## 2016-08-26 DIAGNOSIS — Z720 Tobacco use: Secondary | ICD-10-CM

## 2016-08-26 DIAGNOSIS — I10 Essential (primary) hypertension: Secondary | ICD-10-CM | POA: Diagnosis not present

## 2016-08-26 DIAGNOSIS — R079 Chest pain, unspecified: Secondary | ICD-10-CM | POA: Diagnosis not present

## 2016-08-26 DIAGNOSIS — R059 Cough, unspecified: Secondary | ICD-10-CM

## 2016-08-26 DIAGNOSIS — R05 Cough: Secondary | ICD-10-CM | POA: Diagnosis not present

## 2016-08-26 DIAGNOSIS — L081 Erythrasma: Secondary | ICD-10-CM | POA: Diagnosis not present

## 2016-08-26 MED ORDER — CLARITHROMYCIN 500 MG PO TABS: 500.0000 mg | ORAL_TABLET | Freq: Two times a day (BID) | ORAL | 0 refills | Status: AC

## 2016-08-26 MED ORDER — HYDROCODONE-HOMATROPINE 5-1.5 MG/5ML PO SYRP: 5.0000 mL | ORAL_SOLUTION | Freq: Three times a day (TID) | ORAL | 0 refills | Status: DC | PRN

## 2016-08-26 MED ORDER — CHLORTHALIDONE 25 MG PO TABS: 25.0000 mg | ORAL_TABLET | Freq: Every day | ORAL | 1 refills | Status: DC

## 2016-08-26 NOTE — Progress Notes (Signed)
Pre visit review using our clinic review tool, if applicable. No additional management support is needed unless otherwise documented below in the visit note. 

## 2016-08-26 NOTE — Patient Instructions (Signed)
Cough, Adult Coughing is a reflex that clears your throat and your airways. Coughing helps to heal and protect your lungs. It is normal to cough occasionally, but a cough that happens with other symptoms or lasts a long time may be a sign of a condition that needs treatment. A cough may last only 2-3 weeks (acute), or it may last longer than 8 weeks (chronic). What are the causes? Coughing is commonly caused by:  Breathing in substances that irritate your lungs.  A viral or bacterial respiratory infection.  Allergies.  Asthma.  Postnasal drip.  Smoking.  Acid backing up from the stomach into the esophagus (gastroesophageal reflux).  Certain medicines.  Chronic lung problems, including COPD (or rarely, lung cancer).  Other medical conditions such as heart failure.  Follow these instructions at home: Pay attention to any changes in your symptoms. Take these actions to help with your discomfort:  Take medicines only as told by your health care provider. ? If you were prescribed an antibiotic medicine, take it as told by your health care provider. Do not stop taking the antibiotic even if you start to feel better. ? Talk with your health care provider before you take a cough suppressant medicine.  Drink enough fluid to keep your urine clear or pale yellow.  If the air is dry, use a cold steam vaporizer or humidifier in your bedroom or your home to help loosen secretions.  Avoid anything that causes you to cough at work or at home.  If your cough is worse at night, try sleeping in a semi-upright position.  Avoid cigarette smoke. If you smoke, quit smoking. If you need help quitting, ask your health care provider.  Avoid caffeine.  Avoid alcohol.  Rest as needed.  Contact a health care provider if:  You have new symptoms.  You cough up pus.  Your cough does not get better after 2-3 weeks, or your cough gets worse.  You cannot control your cough with suppressant  medicines and you are losing sleep.  You develop pain that is getting worse or pain that is not controlled with pain medicines.  You have a fever.  You have unexplained weight loss.  You have night sweats. Get help right away if:  You cough up blood.  You have difficulty breathing.  Your heartbeat is very fast. This information is not intended to replace advice given to you by your health care provider. Make sure you discuss any questions you have with your health care provider. Document Released: 03/20/2011 Document Revised: 02/27/2016 Document Reviewed: 11/28/2014 Elsevier Interactive Patient Education  2017 Elsevier Inc.  

## 2016-08-26 NOTE — Progress Notes (Signed)
Subjective:  Patient ID: Lauren Orozco, female    DOB: 1956/02/24  Age: 60 y.o. MRN: [id number]  CC: Cough and Rash   HPI Lauren Orozco presents for concerns about a persistent rash in her groin. It is relatively asymptomatic. She is concerned about the appearance. She has used a topical antifungal and steroid with no improvement.  She also complains of a cough and sore throat for 1 day. The cough is nonproductive but she does complain of diffuse right-sided chest wall pain and shortness of breath. She denies wheezing, hemoptysis, fever, chills, or night sweats.  Outpatient Medications Prior to Visit  Medication Sig Dispense Refill  . ibuprofen (ADVIL,MOTRIN) 600 MG tablet Take 1 tablet (600 mg total) by mouth every 8 (eight) hours as needed. 90 tablet 4  . tapentadol (NUCYNTA ER) 50 MG 12 hr tablet Take 1 tablet (50 mg total) by mouth every 12 (twelve) hours. 60 tablet 0   No facility-administered medications prior to visit.     ROS Review of Systems  Constitutional: Negative.  Negative for appetite change, chills, diaphoresis, fatigue, fever and unexpected weight change.  HENT: Positive for sore throat. Negative for congestion, facial swelling, sinus pressure, trouble swallowing and voice change.   Eyes: Negative.  Negative for visual disturbance.  Respiratory: Positive for cough and shortness of breath. Negative for apnea, choking, chest tightness, wheezing and stridor.   Cardiovascular: Positive for chest pain. Negative for palpitations and leg swelling.  Gastrointestinal: Negative.  Negative for abdominal pain, blood in stool, constipation, diarrhea, nausea and vomiting.  Endocrine: Negative.   Genitourinary: Negative.  Negative for difficulty urinating.  Musculoskeletal: Negative.  Negative for back pain, joint swelling, myalgias and neck pain.  Skin: Positive for rash.  Allergic/Immunologic: Negative.   Neurological: Negative.  Negative for dizziness,  seizures, weakness, light-headedness, numbness and headaches.  Hematological: Negative.  Negative for adenopathy. Does not bruise/bleed easily.  Psychiatric/Behavioral: Negative.     Objective:  BP (!) 160/98 (BP Location: Left Arm, Patient Position: Sitting, Cuff Size: Normal)   Pulse 74   Temp 98.9 F (37.2 C) (Oral)   Resp 16   Ht 5\' 6"  (1.676 m)   Wt 156 lb 4 oz (70.9 kg)   SpO2 95%   BMI 25.22 kg/m   BP Readings from Last 3 Encounters:  08/26/16 (!) 160/98  07/08/16 (!) 150/90  01/02/16 (!) 158/90    Wt Readings from Last 3 Encounters:  08/26/16 156 lb 4 oz (70.9 kg)  07/08/16 153 lb 4 oz (69.5 kg)  01/02/16 150 lb 12.8 oz (68.4 kg)    Physical Exam  Constitutional: She is oriented to person, place, and time. No distress.  HENT:  Mouth/Throat: Oropharynx is clear and moist. No oropharyngeal exudate.  Eyes: Conjunctivae are normal. Right eye exhibits no discharge. Left eye exhibits no discharge. No scleral icterus.  Neck: Normal range of motion. Neck supple. No JVD present. No tracheal deviation present. No thyromegaly present.  Cardiovascular: Normal rate, regular rhythm, normal heart sounds and intact distal pulses.  Exam reveals no gallop and no friction rub.   No murmur heard. Pulmonary/Chest: Effort normal and breath sounds normal. No stridor. No respiratory distress. She has no wheezes. She has no rales. She exhibits no tenderness.  Abdominal: Soft. Bowel sounds are normal. She exhibits no distension and no mass. There is no tenderness. There is no rebound and no guarding.  Musculoskeletal: Normal range of motion. She exhibits no edema, tenderness or deformity.  Lymphadenopathy:  She has no cervical adenopathy.  Neurological: She is oriented to person, place, and time.  Skin: Skin is warm and dry. Rash noted. She is not diaphoretic. No erythema. No pallor.  Symmetrically, in the intertriginous region of the groin there is a uniformly brown and scaly, confluent  rash with no advancing border. The area fluoresces a purplish color under Woods lamp examination.  Vitals reviewed.   Lab Results  Component Value Date   WBC 6.4 07/08/2016   HGB 13.8 07/08/2016   HCT 41.0 07/08/2016   PLT 378.0 07/08/2016   GLUCOSE 107 (H) 07/08/2016   CHOL 231 (H) 08/24/2014   TRIG 79.0 08/24/2014   HDL 59.30 08/24/2014   LDLDIRECT 150.8 10/19/2011   LDLCALC 156 (H) 08/24/2014   ALT 14 07/08/2016   AST 14 07/08/2016   NA 140 07/08/2016   K 4.1 07/08/2016   CL 104 07/08/2016   CREATININE 0.75 07/08/2016   BUN 10 07/08/2016   CO2 29 07/08/2016   TSH 0.71 07/08/2016    US Breast Ltd Uni Left Inc Axilla  Result Date: 12/02/2015 CLINICAL DATA:  Patient was called back from screening mammogram for possible mass in the left breast. EXAM: DIGITAL DIAGNOSTIC LEFT MAMMOGRAM WITH 3D TOMOSYNTHESIS WITH CAD ULTRASOUND LEFT BREAST COMPARISON:  Previous exam(s). ACR Breast Density Category b: There are scattered areas of fibroglandular density. FINDINGS: Additional imaging of the left breast was performed. There is persistence of a 7 mm mass in the lower-inner quadrant of the breast. There are no malignant type microcalcifications. Mammographic images were processed with CAD. On physical exam, I do not palpate a mass in the lower-inner quadrant of the left breast. Targeted ultrasound is performed, showing a well-circumscribed anechoic cyst in the left breast at 7 o'clock 3 cm from the nipple measuring 6 x 7 x 6 mm. IMPRESSION: Left breast cyst.  No evidence of malignancy. RECOMMENDATION: Bilateral screening mammogram in 1 year is recommended. I have discussed the findings and recommendations with the patient. Results were also provided in writing at the conclusion of the visit. If applicable, a reminder letter will be sent to the patient regarding the next appointment. BI-RADS CATEGORY  2: Benign. Electronically Signed   By: Lillia Mountain M.D.   On: 12/02/2015 15:15   Mm Diag Breast  Tomo Uni Left  Result Date: 12/02/2015 CLINICAL DATA:  Patient was called back from screening mammogram for possible mass in the left breast. EXAM: DIGITAL DIAGNOSTIC LEFT MAMMOGRAM WITH 3D TOMOSYNTHESIS WITH CAD ULTRASOUND LEFT BREAST COMPARISON:  Previous exam(s). ACR Breast Density Category b: There are scattered areas of fibroglandular density. FINDINGS: Additional imaging of the left breast was performed. There is persistence of a 7 mm mass in the lower-inner quadrant of the breast. There are no malignant type microcalcifications. Mammographic images were processed with CAD. On physical exam, I do not palpate a mass in the lower-inner quadrant of the left breast. Targeted ultrasound is performed, showing a well-circumscribed anechoic cyst in the left breast at 7 o'clock 3 cm from the nipple measuring 6 x 7 x 6 mm. IMPRESSION: Left breast cyst.  No evidence of malignancy. RECOMMENDATION: Bilateral screening mammogram in 1 year is recommended. I have discussed the findings and recommendations with the patient. Results were also provided in writing at the conclusion of the visit. If applicable, a reminder letter will be sent to the patient regarding the next appointment. BI-RADS CATEGORY  2: Benign. Electronically Signed   By: Georgiana Shore.D.  On: 12/02/2015 15:15    Assessment & Plan:   Prissy was seen today for cough and rash.  Diagnoses and all orders for this visit:  Erythrasma- will treat treat this infection with clarithromycin. -     clarithromycin (BIAXIN) 500 MG tablet; Take 1 tablet (500 mg total) by mouth 2 (two) times daily.  Essential hypertension- her blood pressure is not adequately well controlled. I will treat this with a thiazide diuretic. -     chlorthalidone (HYGROTON) 25 MG tablet; Take 1 tablet (25 mg total) by mouth daily.  Tobacco abuse  Cough- her exam and chest x-ray are within normal limits. Will treat for viral upper respiratory infection with Hycodan as needed. -      DG Chest 2 View; Future -     HYDROcodone-homatropine (HYCODAN) 5-1.5 MG/5ML syrup; Take 5 mLs by mouth every 8 (eight) hours as needed for cough.   I have discontinued Ms. Crisco's tapentadol. I am also having her start on clarithromycin, HYDROcodone-homatropine, and chlorthalidone. Additionally, I am having her maintain her ibuprofen.  Meds ordered this encounter  Medications  . clarithromycin (BIAXIN) 500 MG tablet    Sig: Take 1 tablet (500 mg total) by mouth 2 (two) times daily.    Dispense:  20 tablet    Refill:  0  . HYDROcodone-homatropine (HYCODAN) 5-1.5 MG/5ML syrup    Sig: Take 5 mLs by mouth every 8 (eight) hours as needed for cough.    Dispense:  120 mL    Refill:  0  . chlorthalidone (HYGROTON) 25 MG tablet    Sig: Take 1 tablet (25 mg total) by mouth daily.    Dispense:  90 tablet    Refill:  1     Follow-up: Return in about 3 weeks (around 09/16/2016).  Scarlette Calico, MD

## 2016-10-08 ENCOUNTER — Ambulatory Visit: Payer: Medicare Other | Admitting: Vascular Surgery

## 2016-10-13 ENCOUNTER — Ambulatory Visit (INDEPENDENT_AMBULATORY_CARE_PROVIDER_SITE_OTHER): Payer: Medicare Other | Admitting: Internal Medicine

## 2016-10-13 ENCOUNTER — Encounter: Payer: Self-pay | Admitting: Internal Medicine

## 2016-10-13 ENCOUNTER — Other Ambulatory Visit (INDEPENDENT_AMBULATORY_CARE_PROVIDER_SITE_OTHER): Payer: Medicare Other

## 2016-10-13 VITALS — BP 150/80 | HR 86 | Temp 97.8°F | Resp 16 | Ht 66.0 in | Wt 158.0 lb

## 2016-10-13 DIAGNOSIS — R739 Hyperglycemia, unspecified: Secondary | ICD-10-CM

## 2016-10-13 DIAGNOSIS — Z0000 Encounter for general adult medical examination without abnormal findings: Secondary | ICD-10-CM

## 2016-10-13 DIAGNOSIS — Z23 Encounter for immunization: Secondary | ICD-10-CM

## 2016-10-13 DIAGNOSIS — I10 Essential (primary) hypertension: Secondary | ICD-10-CM | POA: Diagnosis not present

## 2016-10-13 DIAGNOSIS — D171 Benign lipomatous neoplasm of skin and subcutaneous tissue of trunk: Secondary | ICD-10-CM | POA: Insufficient documentation

## 2016-10-13 DIAGNOSIS — D1739 Benign lipomatous neoplasm of skin and subcutaneous tissue of other sites: Secondary | ICD-10-CM | POA: Diagnosis not present

## 2016-10-13 DIAGNOSIS — E118 Type 2 diabetes mellitus with unspecified complications: Secondary | ICD-10-CM | POA: Insufficient documentation

## 2016-10-13 DIAGNOSIS — E785 Hyperlipidemia, unspecified: Secondary | ICD-10-CM

## 2016-10-13 LAB — LIPID PANEL
CHOL/HDL RATIO: 4
Cholesterol: 223 mg/dL — ABNORMAL HIGH (ref 0–200)
HDL: 56.9 mg/dL (ref >39.00)
LDL CALC: 145 mg/dL — AB (ref 0–99)
NONHDL: 166.47
TRIGLYCERIDES: 108 mg/dL (ref 0.0–149.0)
VLDL: 21.6 mg/dL (ref 0.0–40.0)

## 2016-10-13 LAB — URINALYSIS, ROUTINE W REFLEX MICROSCOPIC
Bilirubin Urine: NEGATIVE
Ketones, ur: NEGATIVE
Leukocytes, UA: NEGATIVE
NITRITE: NEGATIVE
Specific Gravity, Urine: 1.02 (ref 1.000–1.030)
Total Protein, Urine: NEGATIVE
Urine Glucose: NEGATIVE
Urobilinogen, UA: 0.2 (ref 0.0–1.0)
pH: 6.5 (ref 5.0–8.0)

## 2016-10-13 LAB — CBC WITH DIFFERENTIAL/PLATELET
Basophils Absolute: 0 10*3/uL (ref 0.0–0.1)
Basophils Relative: 0.8 % (ref 0.0–3.0)
EOS ABS: 0.1 10*3/uL (ref 0.0–0.7)
Eosinophils Relative: 1.6 % (ref 0.0–5.0)
HEMATOCRIT: 40.5 % (ref 36.0–46.0)
Hemoglobin: 13.9 g/dL (ref 12.0–15.0)
Lymphs Abs: 3.5 10*3/uL (ref 0.7–4.0)
MCHC: 34.2 g/dL (ref 30.0–36.0)
MCV: 89.1 fl (ref 78.0–100.0)
MONOS PCT: 8.2 % (ref 3.0–12.0)
Monocytes Absolute: 0.5 10*3/uL (ref 0.1–1.0)
NEUTROS ABS: 2.1 10*3/uL (ref 1.4–7.7)
Neutrophils Relative %: 33.3 % — ABNORMAL LOW (ref 43.0–77.0)
PLATELETS: 417 10*3/uL — AB (ref 150.0–400.0)
RBC: 4.55 Mil/uL (ref 3.87–5.11)
RDW: 13.6 % (ref 11.5–15.5)
WBC: 6.3 10*3/uL (ref 4.0–10.5)

## 2016-10-13 LAB — COMPREHENSIVE METABOLIC PANEL
ALT: 14 U/L (ref 0–35)
AST: 13 U/L (ref 0–37)
Albumin: 4.6 g/dL (ref 3.5–5.2)
Alkaline Phosphatase: 73 U/L (ref 39–117)
BUN: 12 mg/dL (ref 6–23)
CALCIUM: 9.8 mg/dL (ref 8.4–10.5)
CHLORIDE: 103 meq/L (ref 96–112)
CO2: 29 meq/L (ref 19–32)
CREATININE: 0.76 mg/dL (ref 0.40–1.20)
GFR: 99.74 mL/min (ref >60.00)
GLUCOSE: 101 mg/dL — AB (ref 70–99)
Potassium: 4 mEq/L (ref 3.5–5.1)
Sodium: 140 mEq/L (ref 135–145)
Total Bilirubin: 0.4 mg/dL (ref 0.2–1.2)
Total Protein: 7.9 g/dL (ref 6.0–8.3)

## 2016-10-13 LAB — HEMOGLOBIN A1C: Hgb A1c MFr Bld: 6.4 % (ref 4.6–6.5)

## 2016-10-13 LAB — TSH: TSH: 0.6 u[IU]/mL (ref 0.35–4.50)

## 2016-10-13 NOTE — Patient Instructions (Signed)

## 2016-10-13 NOTE — Progress Notes (Signed)
Subjective:  Patient ID: Lauren Orozco, female    DOB: [birth year]  Age: 61 y.o. MRN: [id number]  CC: Hypertension; Hyperlipidemia; and Annual Exam   HPI Jovon Magadan Kosinski presents for a CPX.  She complains of an uncomfortable mass over the top of her right chest. She said she's had this for many years and was previously told that it's a lipoma. She feels like it is becoming uncomfortable and wants to know if it needs to be removed.  She tells me her blood pressures have been well controlled and she denies any recent episodes of headache/blurred vision/chest pain/shortness of breath/palpitations/edema/fatigue.  Outpatient Medications Prior to Visit  Medication Sig Dispense Refill  . chlorthalidone (HYGROTON) 25 MG tablet Take 1 tablet (25 mg total) by mouth daily. 90 tablet 1  . ibuprofen (ADVIL,MOTRIN) 600 MG tablet Take 1 tablet (600 mg total) by mouth every 8 (eight) hours as needed. 90 tablet 4  . HYDROcodone-homatropine (HYCODAN) 5-1.5 MG/5ML syrup Take 5 mLs by mouth every 8 (eight) hours as needed for cough. 120 mL 0   No facility-administered medications prior to visit.     ROS Review of Systems  Constitutional: Negative for activity change, appetite change, diaphoresis, fatigue and unexpected weight change.  HENT: Negative.  Negative for voice change.   Eyes: Negative for photophobia and visual disturbance.  Respiratory: Negative for cough, chest tightness, shortness of breath, wheezing and stridor.   Cardiovascular: Negative.  Negative for chest pain, palpitations and leg swelling.  Gastrointestinal: Negative for abdominal pain, anal bleeding, blood in stool, constipation, diarrhea, nausea and vomiting.  Endocrine: Negative.  Negative for cold intolerance and heat intolerance.  Genitourinary: Negative.  Negative for decreased urine volume, difficulty urinating, dysuria, flank pain, hematuria and urgency.  Musculoskeletal: Negative.  Negative for arthralgias,  back pain, myalgias and neck pain.  Skin: Negative.  Negative for color change and rash.  Allergic/Immunologic: Negative.   Neurological: Negative.  Negative for dizziness, speech difficulty, weakness, numbness and headaches.  Hematological: Negative.  Negative for adenopathy. Does not bruise/bleed easily.  Psychiatric/Behavioral: Negative.     Objective:  BP (!) 150/80 (BP Location: Left Arm, Patient Position: Sitting, Cuff Size: Normal)   Pulse 86   Temp 97.8 F (36.6 C) (Oral)   Resp 16   Ht 5\' 6"  (1.676 m)   Wt 158 lb (71.7 kg)   SpO2 96%   BMI 25.50 kg/m   BP Readings from Last 3 Encounters:  10/13/16 (!) 150/80  08/26/16 (!) 160/98  07/08/16 (!) 150/90    Wt Readings from Last 3 Encounters:  10/13/16 158 lb (71.7 kg)  08/26/16 156 lb 4 oz (70.9 kg)  07/08/16 153 lb 4 oz (69.5 kg)    Physical Exam  Constitutional: She is oriented to person, place, and time. No distress.  HENT:  Mouth/Throat: Oropharynx is clear and moist. No oropharyngeal exudate.  Eyes: Conjunctivae are normal. Right eye exhibits no discharge. Left eye exhibits no discharge. No scleral icterus.  Neck: Normal range of motion. Neck supple. No JVD present. No tracheal deviation present. No thyromegaly present.  Cardiovascular: Normal rate, regular rhythm, normal heart sounds and intact distal pulses.  Exam reveals no gallop and no friction rub.   No murmur heard. Pulmonary/Chest: Effort normal and breath sounds normal. No stridor. No respiratory distress. She has no wheezes. She has no rales. She exhibits no tenderness.    Abdominal: Soft. Bowel sounds are normal. She exhibits no distension and no mass. There is no  tenderness. There is no rebound and no guarding.  Musculoskeletal: Normal range of motion. She exhibits no edema, tenderness or deformity.  Lymphadenopathy:    She has no cervical adenopathy.  Neurological: She is oriented to person, place, and time.  Skin: Skin is warm and dry. No rash  noted. She is not diaphoretic. No erythema. No pallor.  Psychiatric: She has a normal mood and affect. Her behavior is normal. Judgment and thought content normal.  Vitals reviewed.   Lab Results  Component Value Date   WBC 6.3 10/13/2016   HGB 13.9 10/13/2016   HCT 40.5 10/13/2016   PLT 417.0 (H) 10/13/2016   GLUCOSE 101 (H) 10/13/2016   CHOL 223 (H) 10/13/2016   TRIG 108.0 10/13/2016   HDL 56.90 10/13/2016   LDLDIRECT 150.8 10/19/2011   LDLCALC 145 (H) 10/13/2016   ALT 14 10/13/2016   AST 13 10/13/2016   NA 140 10/13/2016   K 4.0 10/13/2016   CL 103 10/13/2016   CREATININE 0.76 10/13/2016   BUN 12 10/13/2016   CO2 29 10/13/2016   TSH 0.60 10/13/2016   HGBA1C 6.4 10/13/2016    Dg Chest 2 View  Result Date: 08/26/2016 CLINICAL DATA:  Right-sided chest pain with dry cough and shortness of Breath EXAM: CHEST  2 VIEW COMPARISON:  01/01/2015 FINDINGS: Cardiac shadow is within normal limits. The lungs are mildly hyperinflated consistent with COPD. Postsurgical changes in the cervical spine are noted. No focal infiltrate or sizable effusion is seen. Mild degenerative changes of the thoracic spine are noted. IMPRESSION: COPD without acute abnormality. Electronically Signed   By: Inez Catalina M.D.   On: 08/26/2016 15:30    Assessment & Plan:   Kniyah was seen today for hypertension, hyperlipidemia and annual exam.  Diagnoses and all orders for this visit:  Routine health maintenance- She refuses vaccines today for influenza and pneumonia, exam completed, labs ordered and reviewed, her Pap/mammogram/colonoscopy are all up-to-date, patient education material was given. -     Lipid panel; Future -     Comprehensive metabolic panel; Future -     CBC with Differential/Platelet; Future -     TSH; Future -     Urinalysis, Routine w reflex microscopic; Future  Essential hypertension- her electrolytes and renal function are normal but her blood pressure is not adequately well  controlled. She is prediabetic so I've asked her to start an ARB. -     telmisartan (MICARDIS) 40 MG tablet; Take 1 tablet (40 mg total) by mouth daily.  Hyperlipidemia with target LDL less than 130- her Framingham risk score is 8% so I have asked her to start taking a statin for cardiovascular risk reduction. -     atorvastatin (LIPITOR) 20 MG tablet; Take 1 tablet (20 mg total) by mouth daily.  Hyperglycemia- her A1c is up to 6.4%, she is prediabetic, no medications are needed at this time. -     Hemoglobin A1c; Future -     telmisartan (MICARDIS) 40 MG tablet; Take 1 tablet (40 mg total) by mouth daily.  Lipoma of chest wall -     Ambulatory referral to General Surgery  Need for prophylactic vaccination against Streptococcus pneumoniae (pneumococcus) -     Pneumococcal conjugate vaccine 13-valent   I have discontinued Ms. Dehner's HYDROcodone-homatropine. I am also having her start on telmisartan and atorvastatin. Additionally, I am having her maintain her ibuprofen and chlorthalidone.  Meds ordered this encounter  Medications  . telmisartan (MICARDIS) 40 MG tablet  Sig: Take 1 tablet (40 mg total) by mouth daily.    Dispense:  90 tablet    Refill:  1  . atorvastatin (LIPITOR) 20 MG tablet    Sig: Take 1 tablet (20 mg total) by mouth daily.    Dispense:  90 tablet    Refill:  3     Follow-up: Return in about 6 months (around 04/12/2017).  Scarlette Calico, MD

## 2016-10-13 NOTE — Progress Notes (Signed)
Pre visit review using our clinic review tool, if applicable. No additional management support is needed unless otherwise documented below in the visit note. 

## 2016-10-14 ENCOUNTER — Encounter: Payer: Self-pay | Admitting: Internal Medicine

## 2016-10-14 MED ORDER — TELMISARTAN 40 MG PO TABS: 40.0000 mg | ORAL_TABLET | Freq: Every day | ORAL | 1 refills | Status: DC

## 2016-10-14 MED ORDER — ATORVASTATIN CALCIUM 20 MG PO TABS: 20.0000 mg | ORAL_TABLET | Freq: Every day | ORAL | 3 refills | Status: DC

## 2016-11-06 DIAGNOSIS — D1720 Benign lipomatous neoplasm of skin and subcutaneous tissue of unspecified limb: Secondary | ICD-10-CM | POA: Diagnosis not present

## 2016-11-18 ENCOUNTER — Ambulatory Visit (INDEPENDENT_AMBULATORY_CARE_PROVIDER_SITE_OTHER): Payer: Medicare Other | Admitting: Internal Medicine

## 2016-11-18 ENCOUNTER — Other Ambulatory Visit (INDEPENDENT_AMBULATORY_CARE_PROVIDER_SITE_OTHER): Payer: Medicare Other

## 2016-11-18 ENCOUNTER — Encounter: Payer: Self-pay | Admitting: Internal Medicine

## 2016-11-18 VITALS — BP 124/78 | HR 95 | Temp 99.0°F | Ht 66.0 in | Wt 150.0 lb

## 2016-11-18 DIAGNOSIS — C8590 Non-Hodgkin lymphoma, unspecified, unspecified site: Secondary | ICD-10-CM

## 2016-11-18 DIAGNOSIS — I10 Essential (primary) hypertension: Secondary | ICD-10-CM

## 2016-11-18 LAB — CBC WITH DIFFERENTIAL/PLATELET
Basophils Absolute: 0.1 10*3/uL (ref 0.0–0.1)
Basophils Relative: 0.9 % (ref 0.0–3.0)
EOS PCT: 0.9 % (ref 0.0–5.0)
Eosinophils Absolute: 0.1 10*3/uL (ref 0.0–0.7)
HCT: 42.5 % (ref 36.0–46.0)
HEMOGLOBIN: 14.5 g/dL (ref 12.0–15.0)
Lymphs Abs: 3.9 10*3/uL (ref 0.7–4.0)
MCHC: 34.2 g/dL (ref 30.0–36.0)
MCV: 88.1 fl (ref 78.0–100.0)
MONOS PCT: 10.4 % (ref 3.0–12.0)
Monocytes Absolute: 0.7 10*3/uL (ref 0.1–1.0)
Neutro Abs: 2 10*3/uL (ref 1.4–7.7)
Neutrophils Relative %: 30.4 % — ABNORMAL LOW (ref 43.0–77.0)
Platelets: 400 10*3/uL (ref 150.0–400.0)
RBC: 4.82 Mil/uL (ref 3.87–5.11)
RDW: 13.1 % (ref 11.5–15.5)
WBC: 6.7 10*3/uL (ref 4.0–10.5)

## 2016-11-18 LAB — COMPREHENSIVE METABOLIC PANEL
ALBUMIN: 4.8 g/dL (ref 3.5–5.2)
ALK PHOS: 76 U/L (ref 39–117)
ALT: 10 U/L (ref 0–35)
AST: 12 U/L (ref 0–37)
BUN: 16 mg/dL (ref 6–23)
CO2: 31 mEq/L (ref 19–32)
Calcium: 9.9 mg/dL (ref 8.4–10.5)
Chloride: 99 mEq/L (ref 96–112)
Creatinine, Ser: 0.83 mg/dL (ref 0.40–1.20)
GFR: 90.07 mL/min (ref >60.00)
GLUCOSE: 111 mg/dL — AB (ref 70–99)
POTASSIUM: 3.5 meq/L (ref 3.5–5.1)
Sodium: 132 mEq/L — ABNORMAL LOW (ref 135–145)
TOTAL PROTEIN: 8.2 g/dL (ref 6.0–8.3)
Total Bilirubin: 0.4 mg/dL (ref 0.2–1.2)

## 2016-11-18 NOTE — Patient Instructions (Signed)
Hypertension Hypertension, commonly called high blood pressure, is when the force of blood pumping through your arteries is too strong. Your arteries are the blood vessels that carry blood from your heart throughout your body. A blood pressure reading consists of a higher number over a lower number, such as 110/72. The higher number (systolic) is the pressure inside your arteries when your heart pumps. The lower number (diastolic) is the pressure inside your arteries when your heart relaxes. Ideally you want your blood pressure below 120/80. Hypertension forces your heart to work harder to pump blood. Your arteries may become narrow or stiff. Having untreated or uncontrolled hypertension can cause heart attack, stroke, kidney disease, and other problems. What increases the risk? Some risk factors for high blood pressure are controllable. Others are not. Risk factors you cannot control include:  Race. You may be at higher risk if you are African American.  Age. Risk increases with age.  Gender. Men are at higher risk than women before age 45 years. After age 65, women are at higher risk than men. Risk factors you can control include:  Not getting enough exercise or physical activity.  Being overweight.  Getting too much fat, sugar, calories, or salt in your diet.  Drinking too much alcohol. What are the signs or symptoms? Hypertension does not usually cause signs or symptoms. Extremely high blood pressure (hypertensive crisis) may cause headache, anxiety, shortness of breath, and nosebleed. How is this diagnosed? To check if you have hypertension, your health care provider will measure your blood pressure while you are seated, with your arm held at the level of your heart. It should be measured at least twice using the same arm. Certain conditions can cause a difference in blood pressure between your right and left arms. A blood pressure reading that is higher than normal on one occasion does  not mean that you need treatment. If it is not clear whether you have high blood pressure, you may be asked to return on a different day to have your blood pressure checked again. Or, you may be asked to monitor your blood pressure at home for 1 or more weeks. How is this treated? Treating high blood pressure includes making lifestyle changes and possibly taking medicine. Living a healthy lifestyle can help lower high blood pressure. You may need to change some of your habits. Lifestyle changes may include:  Following the DASH diet. This diet is high in fruits, vegetables, and whole grains. It is low in salt, red meat, and added sugars.  Keep your sodium intake below 2,300 mg per day.  Getting at least 30-45 minutes of aerobic exercise at least 4 times per week.  Losing weight if necessary.  Not smoking.  Limiting alcoholic beverages.  Learning ways to reduce stress. Your health care provider may prescribe medicine if lifestyle changes are not enough to get your blood pressure under control, and if one of the following is true:  You are 18-59 years of age and your systolic blood pressure is above 140.  You are 60 years of age or older, and your systolic blood pressure is above 150.  Your diastolic blood pressure is above 90.  You have diabetes, and your systolic blood pressure is over 140 or your diastolic blood pressure is over 90.  You have kidney disease and your blood pressure is above 140/90.  You have heart disease and your blood pressure is above 140/90. Your personal target blood pressure may vary depending on your medical   conditions, your age, and other factors. Follow these instructions at home:  Have your blood pressure rechecked as directed by your health care provider.  Take medicines only as directed by your health care provider. Follow the directions carefully. Blood pressure medicines must be taken as prescribed. The medicine does not work as well when you skip  doses. Skipping doses also puts you at risk for problems.  Do not smoke.  Monitor your blood pressure at home as directed by your health care provider. Contact a health care provider if:  You think you are having a reaction to medicines taken.  You have recurrent headaches or feel dizzy.  You have swelling in your ankles.  You have trouble with your vision. Get help right away if:  You develop a severe headache or confusion.  You have unusual weakness, numbness, or feel faint.  You have severe chest or abdominal pain.  You vomit repeatedly.  You have trouble breathing. This information is not intended to replace advice given to you by your health care provider. Make sure you discuss any questions you have with your health care provider. Document Released: 09/21/2005 Document Revised: 02/27/2016 Document Reviewed: 07/14/2013 Elsevier Interactive Patient Education  2017 [employer]  

## 2016-11-18 NOTE — Progress Notes (Signed)
Subjective:  Patient ID: Lauren Orozco, female    DOB: 1955-10-25  Age: 61 y.o. MRN: [id number]  CC: Hypertension   HPI [name] presents for Follow-up. She tells me that one day prior to this visit she had the acute onset of fatigue, lightheadedness, and dizziness. She did a blood pressure at home and says her systolic was down to [id number]. She says the symptoms lasted for about an hour and then resolved. She feels back at her baseline today - today she feels well and offers no complaints. She does not know what could've caused the spell from yesterday - she was feeling well up to that point with no diarrhea, nausea, vomiting, or decreased oral intake. She denies chest pain, diaphoresis, palpitations, or edema.  Outpatient Medications Prior to Visit  Medication Sig Dispense Refill  . atorvastatin (LIPITOR) 20 MG tablet Take 1 tablet (20 mg total) by mouth daily. 90 tablet 3  . ibuprofen (ADVIL,MOTRIN) 600 MG tablet Take 1 tablet (600 mg total) by mouth every 8 (eight) hours as needed. 90 tablet 4  . telmisartan (MICARDIS) 40 MG tablet Take 1 tablet (40 mg total) by mouth daily. 90 tablet 1  . chlorthalidone (HYGROTON) 25 MG tablet Take 1 tablet (25 mg total) by mouth daily. 90 tablet 1   No facility-administered medications prior to visit.     ROS Review of Systems  Constitutional: Positive for fatigue. Negative for activity change, appetite change, diaphoresis and unexpected weight change.  HENT: Negative.  Negative for sore throat and trouble swallowing.   Eyes: Negative for visual disturbance.  Respiratory: Negative for cough, chest tightness, shortness of breath and stridor.   Cardiovascular: Negative for chest pain, palpitations and leg swelling.  Gastrointestinal: Negative for abdominal pain, constipation, diarrhea, nausea and vomiting.  Endocrine: Negative.  Negative for polydipsia, polyphagia and polyuria.  Genitourinary: Negative.  Negative for difficulty  urinating.  Musculoskeletal: Negative.  Negative for back pain, myalgias and neck pain.  Skin: Negative.   Neurological: Positive for dizziness and light-headedness. Negative for tremors, syncope, weakness, numbness and headaches.  Hematological: Negative for adenopathy. Does not bruise/bleed easily.  Psychiatric/Behavioral: Negative.     Objective:  BP 124/78 (BP Location: Left Arm, Patient Position: Sitting, Cuff Size: Normal)   Pulse 95   Temp 99 F (37.2 C) (Oral)   Ht 5\' 6"  (1.676 m)   Wt 150 lb (68 kg)   SpO2 98%   BMI 24.21 kg/m   BP Readings from Last 3 Encounters:  11/18/16 124/78  10/13/16 (!) 150/80  08/26/16 (!) 160/98    Wt Readings from Last 3 Encounters:  11/18/16 150 lb (68 kg)  10/13/16 158 lb (71.7 kg)  08/26/16 156 lb 4 oz (70.9 kg)    Physical Exam  Constitutional: She is oriented to person, place, and time. No distress.  HENT:  Mouth/Throat: Oropharynx is clear and moist. No oropharyngeal exudate.  Eyes: Conjunctivae are normal. Right eye exhibits no discharge. Left eye exhibits no discharge. No scleral icterus.  Neck: Normal range of motion. Neck supple. No JVD present. No tracheal deviation present. No thyromegaly present.  Cardiovascular: Normal rate, regular rhythm, normal heart sounds and intact distal pulses.  Exam reveals no gallop and no friction rub.   No murmur heard. EKG---  Sinus  Rhythm  WITHIN NORMAL LIMITS   Pulmonary/Chest: Effort normal and breath sounds normal. No stridor. No respiratory distress. She has no wheezes. She has no rales. She exhibits no tenderness.  Abdominal:  Soft. Bowel sounds are normal. She exhibits no distension and no mass. There is no tenderness. There is no rebound and no guarding.  Musculoskeletal: Normal range of motion. She exhibits no edema, tenderness or deformity.  Lymphadenopathy:    She has no cervical adenopathy.  Neurological: She is oriented to person, place, and time.  Skin: Skin is warm and  dry. No rash noted. She is not diaphoretic. No erythema. No pallor.  Vitals reviewed.   Lab Results  Component Value Date   WBC 6.7 11/18/2016   HGB 14.5 11/18/2016   HCT 42.5 11/18/2016   PLT 400.0 11/18/2016   GLUCOSE 111 (H) 11/18/2016   CHOL 223 (H) 10/13/2016   TRIG 108.0 10/13/2016   HDL 56.90 10/13/2016   LDLDIRECT 150.8 10/19/2011   LDLCALC 145 (H) 10/13/2016   ALT 10 11/18/2016   AST 12 11/18/2016   NA 132 (L) 11/18/2016   K 3.5 11/18/2016   CL 99 11/18/2016   CREATININE 0.83 11/18/2016   BUN 16 11/18/2016   CO2 31 11/18/2016   TSH 0.60 10/13/2016   HGBA1C 6.4 10/13/2016    Dg Chest 2 View  Result Date: 08/26/2016 CLINICAL DATA:  Right-sided chest pain with dry cough and shortness of Breath EXAM: CHEST  2 VIEW COMPARISON:  01/01/2015 FINDINGS: Cardiac shadow is within normal limits. The lungs are mildly hyperinflated consistent with COPD. Postsurgical changes in the cervical spine are noted. No focal infiltrate or sizable effusion is seen. Mild degenerative changes of the thoracic spine are noted. IMPRESSION: COPD without acute abnormality. Electronically Signed   By: Inez Catalina M.D.   On: 08/26/2016 15:30    Assessment & Plan:   Treneice was seen today for hypertension.  Diagnoses and all orders for this visit:  Essential hypertension- it sounds like her blood pressure has been over controlled and her labs today show mild hyponatremia. I have asked her to decrease the thiazide dose in half, from 25 mg a day to 12.5 mg a day. She will continue the ARB. Her EKG is normal so I don't think she's had a cardiovascular event. -     EKG 12-Lead -     Comprehensive metabolic panel; Future -     CBC with Differential/Platelet; Future -     chlorthalidone (HYGROTON) 25 MG tablet; Take 0.5 tablets (12.5 mg total) by mouth daily.  Low grade lymphoma, stage I ([place])- she has a rising lymphocyte count so I've asked her to follow-up with hematology to see if she needs to  consider treatment options -     Ambulatory referral to Hematology   I have changed Ms. Deliz's chlorthalidone. I am also having her maintain her ibuprofen, telmisartan, and atorvastatin.  Meds ordered this encounter  Medications  . chlorthalidone (HYGROTON) 25 MG tablet    Sig: Take 0.5 tablets (12.5 mg total) by mouth daily.    Dispense:  45 tablet    Refill:  1     Follow-up: Return in about 2 months (around 01/16/2017).  Scarlette Calico, MD

## 2016-11-18 NOTE — Progress Notes (Signed)
Pre visit review using our clinic review tool, if applicable. No additional management support is needed unless otherwise documented below in the visit note. 

## 2016-11-19 ENCOUNTER — Encounter: Payer: Self-pay | Admitting: Internal Medicine

## 2016-11-19 MED ORDER — CHLORTHALIDONE 25 MG PO TABS: 12.5000 mg | ORAL_TABLET | Freq: Every day | ORAL | 1 refills | Status: DC

## 2017-01-01 ENCOUNTER — Other Ambulatory Visit (HOSPITAL_BASED_OUTPATIENT_CLINIC_OR_DEPARTMENT_OTHER): Payer: Medicare Other

## 2017-01-01 ENCOUNTER — Ambulatory Visit: Payer: Medicare Other

## 2017-01-01 ENCOUNTER — Encounter: Payer: Self-pay | Admitting: Hematology and Oncology

## 2017-01-01 ENCOUNTER — Other Ambulatory Visit (HOSPITAL_COMMUNITY)
Admission: RE | Admit: 2017-01-01 | Discharge: 2017-01-01 | Disposition: A | Discharge status: Home / Self Care | Payer: Medicare Other | Source: Ambulatory Visit | Attending: Hematology and Oncology | Admitting: Hematology and Oncology

## 2017-01-01 ENCOUNTER — Other Ambulatory Visit: Payer: Self-pay | Admitting: Hematology and Oncology

## 2017-01-01 ENCOUNTER — Ambulatory Visit (HOSPITAL_BASED_OUTPATIENT_CLINIC_OR_DEPARTMENT_OTHER): Payer: Medicare Other | Admitting: Hematology and Oncology

## 2017-01-01 ENCOUNTER — Telehealth: Payer: Self-pay | Admitting: Hematology and Oncology

## 2017-01-01 DIAGNOSIS — C8590 Non-Hodgkin lymphoma, unspecified, unspecified site: Secondary | ICD-10-CM | POA: Diagnosis not present

## 2017-01-01 DIAGNOSIS — Z720 Tobacco use: Secondary | ICD-10-CM | POA: Diagnosis not present

## 2017-01-01 DIAGNOSIS — Z8572 Personal history of non-Hodgkin lymphomas: Secondary | ICD-10-CM | POA: Diagnosis not present

## 2017-01-01 DIAGNOSIS — R300 Dysuria: Secondary | ICD-10-CM | POA: Diagnosis not present

## 2017-01-01 LAB — CBC WITH DIFFERENTIAL/PLATELET
BASO%: 0.3 % (ref 0.0–2.0)
BASOS ABS: 0 10*3/uL (ref 0.0–0.1)
EOS%: 0.9 % (ref 0.0–7.0)
Eosinophils Absolute: 0.1 10*3/uL (ref 0.0–0.5)
HCT: 37.2 % (ref 34.8–46.6)
HGB: 12.8 g/dL (ref 11.6–15.9)
LYMPH%: 61.1 % — AB (ref 14.0–49.7)
MCH: 30 pg (ref 25.1–34.0)
MCHC: 34.4 g/dL (ref 31.5–36.0)
MCV: 87.3 fL (ref 79.5–101.0)
MONO#: 0.6 10*3/uL (ref 0.1–0.9)
MONO%: 8.1 % (ref 0.0–14.0)
NEUT#: 2 10*3/uL (ref 1.5–6.5)
NEUT%: 29.6 % — AB (ref 38.4–76.8)
Platelets: 360 10*3/uL (ref 145–400)
RBC: 4.26 10*6/uL (ref 3.70–5.45)
RDW: 13.4 % (ref 11.2–14.5)
WBC: 6.8 10*3/uL (ref 3.9–10.3)
lymph#: 4.2 10*3/uL — ABNORMAL HIGH (ref 0.9–3.3)

## 2017-01-01 NOTE — Assessment & Plan Note (Signed)
I spent some time counseling the patient the importance of tobacco cessation. she is currently attempting to quit on her own 

## 2017-01-01 NOTE — Assessment & Plan Note (Signed)
Clinically, she has early-stage disease. No palpable lymphadenopathy of the symptoms. I recommend she quit smoking entirely. She will be coming here once a year with observation and blood work.

## 2017-01-01 NOTE — Progress Notes (Signed)
[facility] OFFICE PROGRESS NOTE  Patient Care Team: Janith Lima, MD as PCP - General (Internal Medicine)  SUMMARY OF ONCOLOGIC HISTORY:   Low grade lymphoma, stage I ([place])   12/18/2014 Pathology Results    Accession: [id number] Flow cytometry showed NHL      01/01/2015 Imaging    CT scan of the chest was performed due to chest pressure and it came back negative.       INTERVAL HISTORY: Please see below for problem oriented charting. She returns for further follow-up Denies recent infection No new lymphadenopathy She is attempting to quit smoking She complained of mild dysuria and passage of smelly urine  REVIEW OF SYSTEMS:   Constitutional: Denies fevers, chills or abnormal weight loss Eyes: Denies blurriness of vision Ears, nose, mouth, throat, and face: Denies mucositis or sore throat Respiratory: Denies cough, dyspnea or wheezes Cardiovascular: Denies palpitation, chest discomfort or lower extremity swelling Gastrointestinal:  Denies nausea, heartburn or change in bowel habits Skin: Denies abnormal skin rashes Lymphatics: Denies new lymphadenopathy or easy bruising Neurological:Denies numbness, tingling or new weaknesses Behavioral/Psych: Mood is stable, no new changes  All other systems were reviewed with the patient and are negative.  I have reviewed the past medical history, past surgical history, social history and family history with the patient and they are unchanged from previous note.  ALLERGIES:  is allergic to ciprofloxacin; ofloxacin; and other.  MEDICATIONS:  Current Outpatient Prescriptions  Medication Sig Dispense Refill  . chlorthalidone (HYGROTON) 25 MG tablet Take 0.5 tablets (12.5 mg total) by mouth daily. 45 tablet 1  . ibuprofen (ADVIL,MOTRIN) 600 MG tablet Take 1 tablet (600 mg total) by mouth every 8 (eight) hours as needed. 90 tablet 4  . telmisartan (MICARDIS) 40 MG tablet Take 1 tablet (40 mg total) by mouth daily. 90 tablet  1  . atorvastatin (LIPITOR) 20 MG tablet Take 1 tablet (20 mg total) by mouth daily. (Patient not taking: Reported on 01/01/2017) 90 tablet 3   No current facility-administered medications for this visit.     PHYSICAL EXAMINATION: ECOG PERFORMANCE STATUS: 0 - Asymptomatic  Vitals:   01/01/17 1057  BP: 126/74  Pulse: 76  Resp: 18  Temp: 98.5 F (36.9 C)   Filed Weights   01/01/17 1057  Weight: 149 lb 9.6 oz (67.9 kg)    GENERAL:alert, no distress and comfortable SKIN: skin color, texture, turgor are normal, no rashes or significant lesions EYES: normal, Conjunctiva are pink and non-injected, sclera clear OROPHARYNX:no exudate, no erythema and lips, buccal mucosa, and tongue normal  NECK: supple, thyroid normal size, non-tender, without nodularity LYMPH:  no palpable lymphadenopathy in the cervical, axillary or inguinal LUNGS: clear to auscultation and percussion with normal breathing effort HEART: regular rate & rhythm and no murmurs and no lower extremity edema ABDOMEN:abdomen soft, non-tender and normal bowel sounds. Mild suprapubic discomfort Musculoskeletal:no cyanosis of digits and no clubbing  NEURO: alert & oriented x 3 with fluent speech, no focal motor/sensory deficits  LABORATORY DATA:  I have reviewed the data as listed    Component Value Date/Time   NA 132 (L) 11/18/2016 1516   NA 140 01/02/2016 1034   K 3.5 11/18/2016 1516   K 4.0 01/02/2016 1034   CL 99 11/18/2016 1516   CO2 31 11/18/2016 1516   CO2 27 01/02/2016 1034   GLUCOSE 111 (H) 11/18/2016 1516   GLUCOSE 99 01/02/2016 1034   BUN 16 11/18/2016 1516   BUN 9.0 01/02/2016 1034  CREATININE 0.83 11/18/2016 1516   CREATININE 0.8 01/02/2016 1034   CALCIUM 9.9 11/18/2016 1516   CALCIUM 9.6 01/02/2016 1034   PROT 8.2 11/18/2016 1516   PROT 7.8 01/02/2016 1034   ALBUMIN 4.8 11/18/2016 1516   ALBUMIN 4.1 01/02/2016 1034   AST 12 11/18/2016 1516   AST 17 01/02/2016 1034   ALT 10 11/18/2016 1516   ALT  17 01/02/2016 1034   ALKPHOS 76 11/18/2016 1516   ALKPHOS 76 01/02/2016 1034   BILITOT 0.4 11/18/2016 1516   BILITOT 0.45 01/02/2016 1034   GFRNONAA >90 03/05/2014 1750   GFRAA >90 03/05/2014 1750    No results found for: SPEP, UPEP  Lab Results  Component Value Date   WBC 6.8 01/01/2017   NEUTROABS 2.0 01/01/2017   HGB 12.8 01/01/2017   HCT 37.2 01/01/2017   MCV 87.3 01/01/2017   PLT 360 01/01/2017      Chemistry      Component Value Date/Time   NA 132 (L) 11/18/2016 1516   NA 140 01/02/2016 1034   K 3.5 11/18/2016 1516   K 4.0 01/02/2016 1034   CL 99 11/18/2016 1516   CO2 31 11/18/2016 1516   CO2 27 01/02/2016 1034   BUN 16 11/18/2016 1516   BUN 9.0 01/02/2016 1034   CREATININE 0.83 11/18/2016 1516   CREATININE 0.8 01/02/2016 1034      Component Value Date/Time   CALCIUM 9.9 11/18/2016 1516   CALCIUM 9.6 01/02/2016 1034   ALKPHOS 76 11/18/2016 1516   ALKPHOS 76 01/02/2016 1034   AST 12 11/18/2016 1516   AST 17 01/02/2016 1034   ALT 10 11/18/2016 1516   ALT 17 01/02/2016 1034   BILITOT 0.4 11/18/2016 1516   BILITOT 0.45 01/02/2016 1034      ASSESSMENT & PLAN:  Low grade lymphoma, stage I ([facility]) Clinically, she has early-stage disease. No palpable lymphadenopathy of the symptoms. I recommend she quit smoking entirely. She will be coming here once a year with observation and blood work.   Tobacco abuse I spent some time counseling the patient the importance of tobacco cessation. she is currently attempting to quit on her own   Dysuria I will order a urine analysis and urine culture to exclude UTI   Orders Placed This Encounter  Procedures  . Urine culture    Standing Status:   Future    Number of Occurrences:   1    Standing Expiration Date:   02/05/2018  . Urinalysis, Microscopic - [facility]    Standing Status:   Future    Number of Occurrences:   1    Standing Expiration Date:   02/05/2018   All questions were answered. The patient knows to call the  clinic with any problems, questions or concerns. No barriers to learning was detected. I spent 15 minutes counseling the patient face to face. The total time spent in the appointment was 20 minutes and more than 50% was on counseling and review of test results     Heath Lark, MD 01/01/2017 2:12 PM

## 2017-01-01 NOTE — Telephone Encounter (Signed)
Appointments scheduled per 3.30.18 LOS. Patient given AVS report and calendars with future scheduled appointments. °

## 2017-01-01 NOTE — Assessment & Plan Note (Signed)
I will order a urine analysis and urine culture to exclude UTI

## 2017-01-06 LAB — FLOW CYTOMETRY

## 2017-01-23 ENCOUNTER — Encounter (HOSPITAL_COMMUNITY): Payer: Self-pay | Admitting: Emergency Medicine

## 2017-01-23 ENCOUNTER — Emergency Department (HOSPITAL_COMMUNITY)
Admission: EM | Admit: 2017-01-23 | Discharge: 2017-01-23 | Disposition: A | Discharge status: Home / Self Care | Payer: Medicare Other | Attending: Emergency Medicine | Admitting: Emergency Medicine

## 2017-01-23 DIAGNOSIS — F17210 Nicotine dependence, cigarettes, uncomplicated: Secondary | ICD-10-CM | POA: Diagnosis not present

## 2017-01-23 DIAGNOSIS — J040 Acute laryngitis: Secondary | ICD-10-CM | POA: Diagnosis not present

## 2017-01-23 DIAGNOSIS — I10 Essential (primary) hypertension: Secondary | ICD-10-CM | POA: Diagnosis not present

## 2017-01-23 DIAGNOSIS — Z79899 Other long term (current) drug therapy: Secondary | ICD-10-CM | POA: Diagnosis not present

## 2017-01-23 DIAGNOSIS — J029 Acute pharyngitis, unspecified: Secondary | ICD-10-CM | POA: Insufficient documentation

## 2017-01-23 LAB — RAPID STREP SCREEN (MED CTR MEBANE ONLY): Streptococcus, Group A Screen (Direct): NEGATIVE

## 2017-01-23 MED ORDER — PENICILLIN G BENZATHINE 1200000 UNIT/2ML IM SUSP
1.2000 10*6.[IU] | Freq: Once | INTRAMUSCULAR | Status: AC
  Administered 2017-01-23: 1.2 10*6.[IU] via INTRAMUSCULAR
  Filled 2017-01-23: qty 2

## 2017-01-23 MED ORDER — DEXAMETHASONE SODIUM PHOSPHATE 10 MG/ML IJ SOLN
10.0000 mg | Freq: Once | INTRAMUSCULAR | Status: AC
  Administered 2017-01-23: 10 mg via INTRAMUSCULAR
  Filled 2017-01-23: qty 1

## 2017-01-23 NOTE — ED Notes (Signed)
Discharge instructions and follow up care reviewed with patient. Patient verbalized understanding. 

## 2017-01-23 NOTE — Discharge Instructions (Signed)
Here strep test was negative. This is likely a viral illness causing her sore throat and hoarse voice. Please make sure you're taking Motrin/ibuprofen over-the-counter along with Tylenol. Warm salt water gargles. Symptoms resolve the next 4-5 days if not need to follow up with your primary care doctor. Return sooner if he develop any difficulties breathing.

## 2017-01-23 NOTE — ED Notes (Signed)
Patient given ice chips for PO challenge.

## 2017-01-23 NOTE — ED Triage Notes (Signed)
Pt has had a sore throat for the past 2 days. When she coughs she feels like something moves in her throat and makes it difficult to breath. Voice hoarse.

## 2017-01-23 NOTE — ED Provider Notes (Signed)
[facility] DEPT Provider Note   CSN: [id number] Arrival date & time: 01/23/17  1545     History   Chief Complaint Chief Complaint  Patient presents with  . Sore Throat    HPI Lauren Orozco is a 61 y.o. female.  HPI 61 year old African-American female past medical history significant for cervical disc disease with fusion, non-Hodgkin's lymphoma that presents to the ED today with complaints of sore throat. Patient states that her throat started hurting approximately 3 days ago. Describes it difficulty to swallow. States she has been taking cough medicine at home with little relief. States that her voice has become hoarse. She denies any fever or chills. He states today he had a sharp pain when she swallowed and felt like he was hard to breathe so he came to the ED for a dilation. Patient denies any difficulty breathing or swallowing at this time. She denies any cough, rhinorrhea, nasal congestion, otalgia, shortness of breath. Past Medical History:  Diagnosis Date  . Anxiety   . Chronic neck pain    with radiculopathy  . Chronic pain of left wrist 2009   "since neck surgery"  . Complication of anesthesia    Hard to wake up per pt; after neck sx had to be bagged.  Marland Kitchen DEPRESSION   . DISC DISEASE, CERVICAL   . Diverticulitis   . HYPERLIPIDEMIA   . Lymphocytosis 01/02/2016  . NEPHROLITHIASIS, HX OF   . NHL (non-Hodgkin's lymphoma) ([place]) 12/25/2014  . Recurrent abdominal pain   . Umbilical hernia   . UTI'S, CHRONIC     Patient Active Problem List   Diagnosis Date Noted  . Dysuria 01/01/2017  . Hyperglycemia 10/13/2016  . Lipoma of chest wall 10/13/2016  . Lymphocytosis (symptomatic) 01/02/2016  . Essential hypertension 01/02/2016  . Bilateral carpal tunnel syndrome 01/29/2015  . Low grade lymphoma, stage I ([place]) 12/25/2014  . Visit for screening mammogram 08/24/2014  . Constipation 10/03/2013  . Routine health maintenance 10/20/2011  . Tobacco abuse 10/19/2011   . Hyperlipidemia with target LDL less than 130 12/22/2010  . Generalized OA 12/22/2010  . [place] DISEASE, CERVICAL 12/22/2010    Past Surgical History:  Procedure Laterality Date  . ABDOMINAL HYSTERECTOMY  2001  . APPENDECTOMY  1981  . KIDNEY STONE SURGERY  2007  . LEFT OOPHORECTOMY  2001  . MYOMECTOMY  1981  . NECK SURGERY  2009   fusion of C1, C2, C3    OB History    No data available       Home Medications    Prior to Admission medications   Medication Sig Start Date End Date Taking? Authorizing Provider  chlorthalidone (HYGROTON) 25 MG tablet Take 0.5 tablets (12.5 mg total) by mouth daily. 11/19/16  Yes Janith Lima, MD  telmisartan (MICARDIS) 40 MG tablet Take 1 tablet (40 mg total) by mouth daily. 10/14/16  Yes Janith Lima, MD  atorvastatin (LIPITOR) 20 MG tablet Take 1 tablet (20 mg total) by mouth daily. Patient not taking: Reported on 01/01/2017 10/14/16   Janith Lima, MD    Family History Family History  Problem Relation Age of Onset  . Hypertension Mother   . Diabetes Mother   . Hyperlipidemia Mother   . Heart attack Father     MI  . Hyperlipidemia Father   . Hypertension Father   . Diabetes Father   . Heart disease Father   . Cancer Neg Hx     Negative for Breast or Colon  Cancer  . Colon cancer Neg Hx     Social History Social History  Substance Use Topics  . Smoking status: Current Every Day Smoker    Packs/day: 0.25    Years: 30.00    Types: Cigarettes  . Smokeless tobacco: Never Used  . Alcohol use No     Allergies   Ciprofloxacin; Ofloxacin; and Other   Review of Systems Review of Systems  Constitutional: Negative for chills and fever.  HENT: Positive for sore throat, trouble swallowing and voice change. Negative for congestion, ear pain, postnasal drip, rhinorrhea, sinus pain, sinus pressure and sneezing.   Eyes: Negative for visual disturbance.  Respiratory: Negative for cough, shortness of breath and wheezing.     Cardiovascular: Negative for chest pain.  Gastrointestinal: Negative for abdominal pain, diarrhea, nausea and vomiting.  Skin: Negative.   Neurological: Negative for dizziness, syncope, weakness, light-headedness, numbness and headaches.     Physical Exam Updated Vital Signs BP (!) 142/101 (BP Location: Left Arm)   Pulse 92   Temp 98.4 F (36.9 C) (Oral)   Resp 18   SpO2 100%   Physical Exam  Constitutional: She is oriented to person, place, and time. She appears well-developed and well-nourished. No distress.  Management secretions and maintaining airway. Speaking complete sentences. Voice is slightly hoarse. Oropharynx airway is patent.  HENT:  Head: Normocephalic and atraumatic.  Right Ear: Tympanic membrane, external ear and ear canal normal.  Left Ear: Tympanic membrane, external ear and ear canal normal.  Nose: Mucosal edema present. No rhinorrhea.  Mouth/Throat: Uvula is midline and mucous membranes are normal. No trismus in the jaw. No uvula swelling. Oropharyngeal exudate, posterior oropharyngeal edema and posterior oropharyngeal erythema present. No tonsillar abscesses. Tonsils are 2+ on the right. Tonsils are 2+ on the left. Tonsillar exudate.  Eyes: Conjunctivae and EOM are normal. Pupils are equal, round, and reactive to light. Right eye exhibits no discharge. Left eye exhibits no discharge. No scleral icterus.  Neck: Normal range of motion. Neck supple.  No nuchal rigidity  Cardiovascular: Normal rate.   Pulmonary/Chest: Effort normal and breath sounds normal. No respiratory distress.  Musculoskeletal: Normal range of motion.  Lymphadenopathy:    She has cervical adenopathy (left sided tenderness to palpation).  Neurological: She is alert and oriented to person, place, and time.  Skin: Skin is warm and dry. Capillary refill takes less than 2 seconds. No pallor.  Nursing note and vitals reviewed.    ED Treatments / Results  Labs (all labs ordered are listed,  but only abnormal results are displayed) Labs Reviewed  RAPID STREP SCREEN (NOT AT [facility])  CULTURE, GROUP A STREP [facility])    EKG  EKG Interpretation None       Radiology No results found.  Procedures Procedures (including critical care time)  Medications Ordered in ED Medications  penicillin g benzathine (BICILLIN LA) 1200000 UNIT/2ML injection 1.2 Million Units (not administered)  dexamethasone (DECADRON) injection 10 mg (10 mg Intramuscular Given 01/23/17 1624)     Initial Impression / Assessment and Plan / ED Course  I have reviewed the triage vital signs and the nursing notes.  Pertinent labs & imaging results that were available during my care of the patient were reviewed by me and considered in my medical decision making (see chart for details).     Patient presents to the ED with complaints of sore throat, difficulty swallowing, hoarse voice. Patient is nontoxic appearing. Denies any subjective fevers or chills. Patient is afebrile with  no tachycardia in the ED. Oropharynx reveals edematous, erythematous, exudative tonsils. Patient speaking complete sentences and maintaining airway. Denies any difficulty breathing, swallowing, short of breath. She has had mild left-sided cervical adenopathy that are tender to palpation. No uvula deviation or concern for deep neck infection. No nuchal rigidity. Low suspicion for meningitis. Patient does have history of non-Hodgkin's lymphoma. Rapid strep is negative. Given high clinical suspicion for strep pharyngitis we'll treat with steroids and penicillin. Discussed CT scan with patient who would like to hold off at this time. Given that she does have tender cervical adenopathy with likely strep or viral pharyngitis do not feel that imaging is warranted given history of non-Hodgkin's lymphoma. Given patient's strict return precautions and encouraged her to follow up with her PCP. Instructed her that swollen lymph nodes likely reactive to viral  process and should resolve in 5-10 days if they do not use follow-up with primary care doctor. Patient is able tolerate by mouth fluids without any difficulties. Patient was understanding the plan of care. All questions were answered prior to discharge. Patient discussed with Dr. Jeneen Rinks was agreeable to the above plan.  Final  Clinical Impressions(s) / ED Diagnoses   Final diagnoses:  Pharyngitis, unspecified etiology  Laryngitis    New Prescriptions New Prescriptions   No medications on file     Doristine Devoid, PA-C 01/23/17 1654    Tanna Furry, MD 01/23/17 1739

## 2017-01-26 LAB — CULTURE, GROUP A STREP (THRC)

## 2017-02-04 ENCOUNTER — Encounter: Payer: Self-pay | Admitting: Internal Medicine

## 2017-02-04 ENCOUNTER — Ambulatory Visit (INDEPENDENT_AMBULATORY_CARE_PROVIDER_SITE_OTHER): Payer: Medicare Other | Admitting: Internal Medicine

## 2017-02-04 DIAGNOSIS — J029 Acute pharyngitis, unspecified: Secondary | ICD-10-CM | POA: Diagnosis not present

## 2017-02-04 MED ORDER — PREDNISONE 20 MG PO TABS: 40.0000 mg | ORAL_TABLET | Freq: Every day | ORAL | 0 refills | Status: DC

## 2017-02-04 MED ORDER — DOXYCYCLINE HYCLATE 100 MG PO TABS: 100.0000 mg | ORAL_TABLET | Freq: Two times a day (BID) | ORAL | 0 refills | Status: DC

## 2017-02-04 NOTE — Patient Instructions (Signed)
We have sent in prednisone to help clear this up. Take 2 pills daily for 5 days.   We have also sent in an antibiotic called doxycycline. Take 1 pill twice a day for 1 week.
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
## 2017-02-04 NOTE — Progress Notes (Signed)
Pre visit review using our clinic review tool, if applicable. No additional management support is needed unless otherwise documented below in the visit note. 

## 2017-02-04 NOTE — Assessment & Plan Note (Signed)
Still with significant tonsillar swelling and redness, no exudate. Will treat aggressively with prednisone and doxycycline given 2 week time course and if no resolution will need more aggressive workup given smoking and history of lymphoma.

## 2017-03-09 ENCOUNTER — Ambulatory Visit (INDEPENDENT_AMBULATORY_CARE_PROVIDER_SITE_OTHER): Payer: Medicare Other | Admitting: *Deleted

## 2017-03-09 VITALS — BP 110/75 | HR 79 | Resp 20 | Ht 66.0 in | Wt 151.0 lb

## 2017-03-09 DIAGNOSIS — E2839 Other primary ovarian failure: Secondary | ICD-10-CM

## 2017-03-09 DIAGNOSIS — Z0000 Encounter for general adult medical examination without abnormal findings: Secondary | ICD-10-CM

## 2017-03-09 NOTE — Patient Instructions (Addendum)
Continue to eat heart healthy diet (full of fruits, vegetables, whole grains, lean protein, water--limit salt, fat, and sugar intake) and increase physical activity as tolerated.  Continue doing brain stimulating activities (puzzles, reading, adult coloring books, staying active) to keep memory sharp.    Lauren Orozco , Thank you for taking time to come for your Medicare Wellness Visit. I appreciate your ongoing commitment to your health goals. Please review the following plan we discussed and let me know if I can assist you in the future.   These are the goals we discussed: Goals    . Go to dentist, and get the moles removed.          That would make me feel better overall.       This is a list of the screening recommended for you and due dates:  Health Maintenance  Topic Date Due  . Flu Shot  05/05/2017  . Pap Smear  08/29/2017  . Mammogram  11/12/2017  . Tetanus Vaccine  08/24/2024  . Colon Cancer Screening  11/28/2024  .  Hepatitis C: One time screening is recommended by Center for Disease Control  (CDC) for  adults born from [birth year] through 1965.   Completed  . HIV Screening  Completed   Insomnia Insomnia is a sleep disorder that makes it difficult to fall asleep or to stay asleep. Insomnia can cause tiredness (fatigue), low energy, difficulty concentrating, mood swings, and poor performance at work or school. There are three different ways to classify insomnia:  Difficulty falling asleep.  Difficulty staying asleep.  Waking up too early in the morning.  Any type of insomnia can be long-term (chronic) or short-term (acute). Both are common. Short-term insomnia usually lasts for three months or less. Chronic insomnia occurs at least three times a week for longer than three months. What are the causes? Insomnia may be caused by another condition, situation, or substance, such as:  Anxiety.  Certain medicines.  Gastroesophageal reflux disease (GERD) or other  gastrointestinal conditions.  Asthma or other breathing conditions.  Restless legs syndrome, sleep apnea, or other sleep disorders.  Chronic pain.  Menopause. This may include hot flashes.  Stroke.  Abuse of alcohol, tobacco, or illegal drugs.  Depression.  Caffeine.  Neurological disorders, such as Alzheimer disease.  An overactive thyroid (hyperthyroidism).  The cause of insomnia may not be known. What increases the risk? Risk factors for insomnia include:  Gender. Women are more commonly affected than men.  Age. Insomnia is more common as you get older.  Stress. This may involve your professional or personal life.  Income. Insomnia is more common in people with lower income.  Lack of exercise.  Irregular work schedule or night shifts.  Traveling between different time zones.  What are the signs or symptoms? If you have insomnia, trouble falling asleep or trouble staying asleep is the main symptom. This may lead to other symptoms, such as:  Feeling fatigued.  Feeling nervous about going to sleep.  Not feeling rested in the morning.  Having trouble concentrating.  Feeling irritable, anxious, or depressed.  How is this treated? Treatment for insomnia depends on the cause. If your insomnia is caused by an underlying condition, treatment will focus on addressing the condition. Treatment may also include:  Medicines to help you sleep.  Counseling or therapy.  Lifestyle adjustments.  Follow these instructions at home:  Take medicines only as directed by your health care provider.  Keep regular sleeping and waking hours. Avoid  naps.  Keep a sleep diary to help you and your health care provider figure out what could be causing your insomnia. Include: ? When you sleep. ? When you wake up during the night. ? How well you sleep. ? How rested you feel the next day. ? Any side effects of medicines you are taking. ? What you eat and drink.  Make your  bedroom a comfortable place where it is easy to fall asleep: ? Put up shades or special blackout curtains to block light from outside. ? Use a white noise machine to block noise. ? Keep the temperature cool.  Exercise regularly as directed by your health care provider. Avoid exercising right before bedtime.  Use relaxation techniques to manage stress. Ask your health care provider to suggest some techniques that may work well for you. These may include: ? Breathing exercises. ? Routines to release muscle tension. ? Visualizing peaceful scenes.  Cut back on alcohol, caffeinated beverages, and cigarettes, especially close to bedtime. These can disrupt your sleep.  Do not overeat or eat spicy foods right before bedtime. This can lead to digestive discomfort that can make it hard for you to sleep.  Limit screen use before bedtime. This includes: ? Watching TV. ? Using your smartphone, tablet, and computer.  Stick to a routine. This can help you fall asleep faster. Try to do a quiet activity, brush your teeth, and go to bed at the same time each night.  Get out of bed if you are still awake after 15 minutes of trying to sleep. Keep the lights down, but try reading or doing a quiet activity. When you feel sleepy, go back to bed.  Make sure that you drive carefully. Avoid driving if you feel very sleepy.  Keep all follow-up appointments as directed by your health care provider. This is important. Contact a health care provider if:  You are tired throughout the day or have trouble in your daily routine due to sleepiness.  You continue to have sleep problems or your sleep problems get worse. Get help right away if:  You have serious thoughts about hurting yourself or someone else. This information is not intended to replace advice given to you by your health care provider. Make sure you discuss any questions you have with your health care provider. Document Released: 09/18/2000 Document  Revised: 02/21/2016 Document Reviewed: 06/22/2014 Elsevier Interactive Patient Education  [employer].

## 2017-03-09 NOTE — Progress Notes (Addendum)
Subjective:   Lauren Orozco is a 61 y.o. female who presents for an Initial Medicare Annual Wellness Visit.  Patient states her blood pressure has been running low at home. At times she reports feeling dizzy.   Review of Systems    No ROS.  Medicare Wellness Visit. Cardiac Risk Factors include: advanced age (>70men, >31 women);dyslipidemia;hypertension  Sleep patterns: has frequent nighttime awakenings and sleeps 4-5 hours nightly. Patient reports insomnia issues, discussed recommended sleep tips and stress reduction tips, education was attached to patient's AVS.   Home Safety/Smoke Alarms: Feels safe in home. Smoke alarms in place.   Living environment; residence and Firearm Safety: 1-story house/ trailer, no firearmsLives with daughter, good family and church support system, no needs for DME Seat Belt Safety/Bike Helmet: Wears seat belt.   Counseling:   Eye Exam- appointment yearly Dental- resources provided  Female:   Pap- Last 08/29/14, Hysterectomy      Mammo- Last 11/13/15, BI-RADS CATEGORY  2: Benign    Dexa scan- N/D, referral placed today        CCS- Last 11/28/14, Benign polyps, recall 10 years     Objective:    Today's Vitals   03/09/17 1031 03/09/17 1045  BP: 110/75   Pulse: 79   Resp: 20   SpO2: 100%   Weight: 151 lb (68.5 kg)   Height: 5\' 6"  (1.676 m)   PainSc:  3    Body mass index is 24.37 kg/m.   Current Medications (verified) Outpatient Encounter Prescriptions as of 03/09/2017  Medication Sig  . atorvastatin (LIPITOR) 20 MG tablet Take 1 tablet (20 mg total) by mouth daily.  . chlorthalidone (HYGROTON) 25 MG tablet Take 0.5 tablets (12.5 mg total) by mouth daily.  Marland Kitchen telmisartan (MICARDIS) 40 MG tablet Take 1 tablet (40 mg total) by mouth daily.  . [DISCONTINUED] doxycycline (VIBRA-TABS) 100 MG tablet Take 1 tablet (100 mg total) by mouth 2 (two) times daily. (Patient not taking: Reported on 03/09/2017)  . [DISCONTINUED] predniSONE (DELTASONE)  20 MG tablet Take 2 tablets (40 mg total) by mouth daily with breakfast. (Patient not taking: Reported on 03/09/2017)   No facility-administered encounter medications on file as of 03/09/2017.     Allergies (verified) Ciprofloxacin; Ofloxacin; and Other   History: Past Medical History:  Diagnosis Date  . Anxiety   . Chronic neck pain    with radiculopathy  . Chronic pain of left wrist 2009   "since neck surgery"  . Complication of anesthesia    Hard to wake up per pt; after neck sx had to be bagged.  Marland Kitchen DEPRESSION   . DISC DISEASE, CERVICAL   . Diverticulitis   . HYPERLIPIDEMIA   . Lymphocytosis 01/02/2016  . NEPHROLITHIASIS, HX OF   . NHL (non-Hodgkin's lymphoma) ([facility]) 12/25/2014  . Recurrent abdominal pain   . Umbilical hernia   . UTI'S, CHRONIC    Past Surgical History:  Procedure Laterality Date  . ABDOMINAL HYSTERECTOMY  2001  . APPENDECTOMY  1981  . KIDNEY STONE SURGERY  2007  . LEFT OOPHORECTOMY  2001  . MYOMECTOMY  1981  . NECK SURGERY  2009   fusion of C1, C2, C3   Family History  Problem Relation Age of Onset  . Hypertension Mother   . Diabetes Mother   . Hyperlipidemia Mother   . Heart attack Father        MI  . Hyperlipidemia Father   . Hypertension Father   . Diabetes Father   .  Heart disease Father   . Cancer Neg Hx        Negative for Breast or Colon Cancer  . Colon cancer Neg Hx    Social History   Occupational History  . Disabled    Social History Main Topics  . Smoking status: Former Smoker    Packs/day: 0.25    Years: 30.00    Types: Cigarettes  . Smokeless tobacco: Never Used     Comment: stopped around march   . Alcohol use No  . Drug use: No  . Sexual activity: Not Currently    Tobacco Counseling Counseling given: Not Answered   Activities of Daily Living In your present state of health, do you have any difficulty performing the following activities: 03/09/2017  Hearing? N  Vision? N  Difficulty concentrating or making  decisions? N  Walking or climbing stairs? N  Dressing or bathing? N  Doing errands, shopping? N  Preparing Food and eating ? N  Using the Toilet? N  In the past six months, have you accidently leaked urine? N  Do you have problems with loss of bowel control? N  Managing your Medications? N  Managing your Finances? N  Housekeeping or managing your Housekeeping? N  Some recent data might be hidden    Immunizations and Health Maintenance Immunization History  Administered Date(s) Administered  . Pneumococcal Conjugate-13 10/13/2016  . Tdap 08/24/2014   There are no preventive care reminders to display for this patient.  Patient Care Team: Janith Lima, MD as PCP - General (Internal Medicine) Heath Lark, MD as Consulting Physician (Hematology and Oncology) Lyndal Pulley, DO as Attending Physician (Family Medicine) Alda Berthold, DO as Consulting Physician (Neurology)  Indicate any recent Medical Services you may have received from other than [facility] providers in the past year (date may be approximate).     Assessment:   This is a routine wellness examination for Lauren Orozco. Physical assessment deferred to PCP.   Hearing/Vision screen Hearing Screening Comments: Able to hear conversational tones w/o difficulty. No issues reported.  Passed whisper test Vision Screening Comments: Wears glasses  Dietary issues and exercise activities discussed: Current Exercise Habits: Home exercise routine (reports she plays daily with nephew outside doing sports-like activities), Type of exercise: walking, Time (Minutes): 30, Frequency (Times/Week): 5, Weekly Exercise (Minutes/Week): 150, Intensity: Mild, Exercise limited by: None identified  Diet (meal preparation, eat out, water intake, caffeinated beverages, dairy products, fruits and vegetables): in general, a "healthy" diet  , well balanced, low fat/ cholesterol, low salteats a variety of fruits and vegetables daily, limits salt,  fat/cholesterol, sugar, caffeine, drinks 1-2 glasses of water daily. Reports drinking lemonade and sweet tea mainly.  Reviewed heart healthy diet, encouraged patient to increase daily water intake and decrease daily intake of sugar drinks.  Goals    . I want to go to dentist, and get the moles I have removed.          That would make me feel better about myself.      Depression Screen PHQ 2/9 Scores 03/09/2017  PHQ - 2 Score 0    Fall Risk Fall Risk  03/09/2017  Falls in the past year? No    Cognitive Function:       Ad8 score reviewed for issues:  Issues making decisions: no  Less interest in hobbies / activities: no  Repeats questions, stories (family complaining): no  Trouble using ordinary gadgets (microwave, computer, phone): no  Forgets the month or  year: no  Mismanaging finances: no  Remembering appts: no  Daily problems with thinking and/or memory: no Ad8 score is= 0  Screening Tests Health Maintenance  Topic Date Due  . INFLUENZA VACCINE  05/05/2017  . PAP SMEAR  08/29/2017  . MAMMOGRAM  11/12/2017  . TETANUS/TDAP  08/24/2024  . COLONOSCOPY  11/28/2024  . Hepatitis C Screening  Completed  . HIV Screening  Completed      Plan:     Scheduled PCP appointment for 03/11/17 to follow-up regarding patient's report of low blood pressure and feeling dizzy.  Continue to eat heart healthy diet (full of fruits, vegetables, whole grains, lean protein, water--limit salt, fat, and sugar intake) and increase physical activity as tolerated.  Continue doing brain stimulating activities (puzzles, reading, adult coloring books, staying active) to keep memory sharp.    I have personally reviewed and noted the following in the patient's chart:   . Medical and social history . Use of alcohol, tobacco or illicit drugs  . Current medications and supplements . Functional ability and status . Nutritional status . Physical activity . Advanced directives . List of  other physicians . Vitals . Screenings to include cognitive, depression, and falls . Referrals and appointments  In addition, I have reviewed and discussed with patient certain preventive protocols, quality metrics, and best practice recommendations. A written personalized care plan for preventive services as well as general preventive health recommendations were provided to patient.     Michiel Cowboy, RN   03/09/2017     Medical screening examination/treatment/procedure(s) were performed by non-physician practitioner and as supervising physician I was immediately available for consultation/collaboration. I agree with above. Scarlette Calico, MD

## 2017-03-09 NOTE — Progress Notes (Signed)
Pre visit review using our clinic review tool, if applicable. No additional management support is needed unless otherwise documented below in the visit note. 

## 2017-03-11 ENCOUNTER — Encounter: Payer: Self-pay | Admitting: Internal Medicine

## 2017-03-11 ENCOUNTER — Other Ambulatory Visit (INDEPENDENT_AMBULATORY_CARE_PROVIDER_SITE_OTHER): Payer: Medicare Other

## 2017-03-11 ENCOUNTER — Ambulatory Visit (INDEPENDENT_AMBULATORY_CARE_PROVIDER_SITE_OTHER): Payer: Medicare Other | Admitting: Internal Medicine

## 2017-03-11 VITALS — BP 130/80 | HR 75 | Temp 98.8°F | Resp 16 | Ht 66.0 in | Wt 150.5 lb

## 2017-03-11 DIAGNOSIS — R7303 Prediabetes: Secondary | ICD-10-CM

## 2017-03-11 DIAGNOSIS — D72820 Lymphocytosis (symptomatic): Secondary | ICD-10-CM

## 2017-03-11 DIAGNOSIS — E785 Hyperlipidemia, unspecified: Secondary | ICD-10-CM

## 2017-03-11 DIAGNOSIS — I10 Essential (primary) hypertension: Secondary | ICD-10-CM | POA: Diagnosis not present

## 2017-03-11 DIAGNOSIS — L858 Other specified epidermal thickening: Secondary | ICD-10-CM

## 2017-03-11 DIAGNOSIS — Z1231 Encounter for screening mammogram for malignant neoplasm of breast: Secondary | ICD-10-CM

## 2017-03-11 LAB — CBC WITH DIFFERENTIAL/PLATELET
Basophils Absolute: 0 10*3/uL (ref 0.0–0.1)
Basophils Relative: 0.6 % (ref 0.0–3.0)
EOS PCT: 1.3 % (ref 0.0–5.0)
Eosinophils Absolute: 0.1 10*3/uL (ref 0.0–0.7)
HCT: 38.5 % (ref 36.0–46.0)
HEMOGLOBIN: 13.2 g/dL (ref 12.0–15.0)
Lymphocytes Relative: 58.5 % — ABNORMAL HIGH (ref 12.0–46.0)
Lymphs Abs: 3.9 10*3/uL (ref 0.7–4.0)
MCHC: 34.2 g/dL (ref 30.0–36.0)
MCV: 90.4 fl (ref 78.0–100.0)
MONOS PCT: 10.4 % (ref 3.0–12.0)
Monocytes Absolute: 0.7 10*3/uL (ref 0.1–1.0)
Neutro Abs: 2 10*3/uL (ref 1.4–7.7)
Neutrophils Relative %: 29.2 % — ABNORMAL LOW (ref 43.0–77.0)
Platelets: 414 10*3/uL — ABNORMAL HIGH (ref 150.0–400.0)
RBC: 4.26 Mil/uL (ref 3.87–5.11)
RDW: 14.1 % (ref 11.5–15.5)
WBC: 6.7 10*3/uL (ref 4.0–10.5)

## 2017-03-11 LAB — LIPID PANEL
Cholesterol: 200 mg/dL (ref 0–200)
HDL: 57.6 mg/dL (ref >39.00)
LDL Cholesterol: 124 mg/dL — ABNORMAL HIGH (ref 0–99)
NONHDL: 142.88
Total CHOL/HDL Ratio: 3
Triglycerides: 93 mg/dL (ref 0.0–149.0)
VLDL: 18.6 mg/dL (ref 0.0–40.0)

## 2017-03-11 LAB — BASIC METABOLIC PANEL
BUN: 16 mg/dL (ref 6–23)
CALCIUM: 9.9 mg/dL (ref 8.4–10.5)
CO2: 28 meq/L (ref 19–32)
CREATININE: 0.87 mg/dL (ref 0.40–1.20)
Chloride: 103 mEq/L (ref 96–112)
GFR: 85.22 mL/min (ref >60.00)
GLUCOSE: 101 mg/dL — AB (ref 70–99)
Potassium: 3.8 mEq/L (ref 3.5–5.1)
Sodium: 137 mEq/L (ref 135–145)

## 2017-03-11 LAB — HEMOGLOBIN A1C: Hgb A1c MFr Bld: 6.6 % — ABNORMAL HIGH (ref 4.6–6.5)

## 2017-03-11 NOTE — Progress Notes (Signed)
Subjective:  Patient ID: Lauren Orozco, female    DOB: 04/16/1956  Age: 61 y.o. MRN: [id number]  CC: Hypertension and Hyperlipidemia   HPI Lauren Orozco presents for Follow-up- she complains of a lesion on the right side of her face and wants to see a dermatologist.  She tells me her blood pressure is well-controlled. She has started a walking program and denies DOE, CPE, edema, or fatigue.  Outpatient Medications Prior to Visit  Medication Sig Dispense Refill  . atorvastatin (LIPITOR) 20 MG tablet Take 1 tablet (20 mg total) by mouth daily. 90 tablet 3  . chlorthalidone (HYGROTON) 25 MG tablet Take 0.5 tablets (12.5 mg total) by mouth daily. 45 tablet 1  . telmisartan (MICARDIS) 40 MG tablet Take 1 tablet (40 mg total) by mouth daily. 90 tablet 1   No facility-administered medications prior to visit.     ROS Review of Systems  Constitutional: Negative.  Negative for activity change, appetite change, diaphoresis, fatigue and unexpected weight change.  HENT: Negative.   Eyes: Negative.  Negative for visual disturbance.  Respiratory: Negative.  Negative for cough, chest tightness, shortness of breath and wheezing.   Cardiovascular: Negative for chest pain, palpitations and leg swelling.  Gastrointestinal: Negative.  Negative for abdominal pain, constipation, diarrhea, nausea and vomiting.  Endocrine: Negative.  Negative for polydipsia, polyphagia and polyuria.  Genitourinary: Negative.  Negative for difficulty urinating.  Musculoskeletal: Negative.  Negative for back pain, myalgias and neck pain.  Skin: Negative.  Negative for color change and rash.  Neurological: Negative.  Negative for dizziness, weakness, light-headedness and numbness.  Hematological: Negative for adenopathy. Does not bruise/bleed easily.  Psychiatric/Behavioral: Negative.     Objective:  BP 130/80 (BP Location: Left Arm, Patient Position: Sitting, Cuff Size: Normal)   Pulse 75   Temp  98.8 F (37.1 C) (Oral)   Resp 16   Ht 5\' 6"  (1.676 m)   Wt 150 lb 8 oz (68.3 kg)   SpO2 99%   BMI 24.29 kg/m   BP Readings from Last 3 Encounters:  03/11/17 130/80  03/09/17 110/75  02/04/17 130/76    Wt Readings from Last 3 Encounters:  03/11/17 150 lb 8 oz (68.3 kg)  03/09/17 151 lb (68.5 kg)  02/04/17 147 lb (66.7 kg)    Physical Exam  Constitutional: She is oriented to person, place, and time. No distress.  HENT:  Mouth/Throat: Oropharynx is clear and moist. No oropharyngeal exudate.  Eyes: Conjunctivae are normal. Right eye exhibits no discharge. Left eye exhibits no discharge. No scleral icterus.  Neck: Normal range of motion. Neck supple. No JVD present. No thyromegaly present.  Cardiovascular: Normal rate, regular rhythm and intact distal pulses.   No murmur heard. Pulmonary/Chest: Effort normal and breath sounds normal. No respiratory distress. She has no wheezes. She has no rales. She exhibits no tenderness.  Abdominal: Soft. Bowel sounds are normal. She exhibits no distension and no mass. There is no tenderness. There is no rebound and no guarding.  Musculoskeletal: Normal range of motion. She exhibits no edema, tenderness or deformity.  Neurological: She is alert and oriented to person, place, and time.  Skin: Skin is warm and dry. No rash noted. She is not diaphoretic. No erythema. No pallor.  There is a cutaneous horn on the right malar surface  Vitals reviewed.   Lab Results  Component Value Date   WBC 6.7 03/11/2017   HGB 13.2 03/11/2017   HCT 38.5 03/11/2017   PLT 414.0 (  H) 03/11/2017   GLUCOSE 101 (H) 03/11/2017   CHOL 200 03/11/2017   TRIG 93.0 03/11/2017   HDL 57.60 03/11/2017   LDLDIRECT 150.8 10/19/2011   LDLCALC 124 (H) 03/11/2017   ALT 10 11/18/2016   AST 12 11/18/2016   NA 137 03/11/2017   K 3.8 03/11/2017   CL 103 03/11/2017   CREATININE 0.87 03/11/2017   BUN 16 03/11/2017   CO2 28 03/11/2017   TSH 0.60 10/13/2016   HGBA1C 6.6 (H)  03/11/2017    No results found.  Assessment & Plan:   Zyrah was seen today for hypertension and hyperlipidemia.  Diagnoses and all orders for this visit:  Essential hypertension- her blood pressure is well-controlled, electrolytes and renal function are normal  Prediabetes- her A1c is up to 6.6% so she now has type 2 diabetes mellitus, she does not need a medication to treat this but she does agree to work on her lifestyle modifications. -     Basic metabolic panel; Future -     Hemoglobin A1c; Future  Lymphocytosis (symptomatic)- this is stable over the last 8 months and she is asymptomatic so for now will continue observation with no intervention -     CBC with Differential/Platelet; Future  Visit for screening mammogram -     MM DIGITAL SCREENING BILATERAL; Future  Cutaneous horn -     Ambulatory referral to Dermatology  Hyperlipidemia with target LDL less than 130- she has achieved her LDL goal is doing well on the statin -     Lipid panel; Future   I am having Ms. Tafoya maintain her telmisartan, atorvastatin, and chlorthalidone.  No orders of the defined types were placed in this encounter.    Follow-up: No Follow-up on file.  Scarlette Calico, MD

## 2017-03-11 NOTE — Patient Instructions (Signed)

## 2017-03-12 ENCOUNTER — Encounter: Payer: Self-pay | Admitting: Internal Medicine

## 2017-03-21 ENCOUNTER — Encounter (HOSPITAL_COMMUNITY): Payer: Self-pay

## 2017-03-21 ENCOUNTER — Telehealth: Payer: Self-pay | Admitting: Internal Medicine

## 2017-03-21 ENCOUNTER — Emergency Department (HOSPITAL_COMMUNITY)
Admission: EM | Admit: 2017-03-21 | Discharge: 2017-03-21 | Disposition: A | Discharge status: Home / Self Care | Payer: Medicare Other | Attending: Emergency Medicine | Admitting: Emergency Medicine

## 2017-03-21 DIAGNOSIS — Z5321 Procedure and treatment not carried out due to patient leaving prior to being seen by health care provider: Secondary | ICD-10-CM | POA: Diagnosis not present

## 2017-03-21 DIAGNOSIS — R51 Headache: Secondary | ICD-10-CM | POA: Diagnosis not present

## 2017-03-21 HISTORY — DX: Essential (primary) hypertension: I10

## 2017-03-21 NOTE — ED Triage Notes (Signed)
Onset this morning pt woke up with headache and "just feeling funny".  Pt took BP, right arm 102/88, left arm 88/58.  Pt started BP med several months ago.  Pt called PCP this morning and was advised to go to ED.

## 2017-03-22 ENCOUNTER — Other Ambulatory Visit: Payer: Self-pay | Admitting: Internal Medicine

## 2017-03-22 ENCOUNTER — Encounter: Payer: Self-pay | Admitting: Internal Medicine

## 2017-04-19 ENCOUNTER — Other Ambulatory Visit: Payer: Self-pay | Admitting: Internal Medicine

## 2017-04-19 DIAGNOSIS — I10 Essential (primary) hypertension: Secondary | ICD-10-CM

## 2017-04-19 DIAGNOSIS — R739 Hyperglycemia, unspecified: Secondary | ICD-10-CM

## 2017-04-19 NOTE — Telephone Encounter (Signed)
Patient called to follow up on refill request. States she will be going out of town on Wednesday 7/18

## 2017-04-19 NOTE — Telephone Encounter (Signed)
MD just see approval back to pharmacy...Lauren Orozco

## 2017-07-03 ENCOUNTER — Emergency Department (HOSPITAL_COMMUNITY): Payer: Medicare Other

## 2017-07-03 ENCOUNTER — Encounter (HOSPITAL_COMMUNITY): Payer: Self-pay | Admitting: Emergency Medicine

## 2017-07-03 DIAGNOSIS — Z79899 Other long term (current) drug therapy: Secondary | ICD-10-CM | POA: Insufficient documentation

## 2017-07-03 DIAGNOSIS — J3489 Other specified disorders of nose and nasal sinuses: Secondary | ICD-10-CM | POA: Diagnosis not present

## 2017-07-03 DIAGNOSIS — R05 Cough: Secondary | ICD-10-CM | POA: Insufficient documentation

## 2017-07-03 DIAGNOSIS — J029 Acute pharyngitis, unspecified: Secondary | ICD-10-CM | POA: Diagnosis not present

## 2017-07-03 DIAGNOSIS — I10 Essential (primary) hypertension: Secondary | ICD-10-CM | POA: Insufficient documentation

## 2017-07-03 DIAGNOSIS — R0981 Nasal congestion: Secondary | ICD-10-CM | POA: Diagnosis not present

## 2017-07-03 DIAGNOSIS — Z8572 Personal history of non-Hodgkin lymphomas: Secondary | ICD-10-CM | POA: Diagnosis not present

## 2017-07-03 DIAGNOSIS — Z87891 Personal history of nicotine dependence: Secondary | ICD-10-CM | POA: Insufficient documentation

## 2017-07-03 DIAGNOSIS — R6883 Chills (without fever): Secondary | ICD-10-CM | POA: Diagnosis not present

## 2017-07-03 DIAGNOSIS — E870 Hyperosmolality and hypernatremia: Secondary | ICD-10-CM | POA: Diagnosis not present

## 2017-07-03 NOTE — ED Triage Notes (Signed)
Pt c/o body aches, chills, nasal drainage, chest tightness with coughing. Fever, HA. Onset yesterday.

## 2017-07-04 ENCOUNTER — Emergency Department (HOSPITAL_COMMUNITY)
Admission: EM | Admit: 2017-07-04 | Discharge: 2017-07-04 | Disposition: A | Discharge status: Home / Self Care | Payer: Medicare Other | Attending: Emergency Medicine | Admitting: Emergency Medicine

## 2017-07-04 DIAGNOSIS — E870 Hyperosmolality and hypernatremia: Secondary | ICD-10-CM

## 2017-07-04 DIAGNOSIS — R6889 Other general symptoms and signs: Secondary | ICD-10-CM

## 2017-07-04 LAB — CBC WITH DIFFERENTIAL/PLATELET
BASOS PCT: 0 %
Basophils Absolute: 0 10*3/uL (ref 0.0–0.1)
Eosinophils Absolute: 0.1 10*3/uL (ref 0.0–0.7)
Eosinophils Relative: 1 %
HEMATOCRIT: 38.7 % (ref 36.0–46.0)
HEMOGLOBIN: 13.3 g/dL (ref 12.0–15.0)
LYMPHS ABS: 2.3 10*3/uL (ref 0.7–4.0)
LYMPHS PCT: 23 %
MCH: 30.1 pg (ref 26.0–34.0)
MCHC: 34.4 g/dL (ref 30.0–36.0)
MCV: 87.6 fL (ref 78.0–100.0)
MONO ABS: 0.8 10*3/uL (ref 0.1–1.0)
MONOS PCT: 8 %
NEUTROS ABS: 6.6 10*3/uL (ref 1.7–7.7)
NEUTROS PCT: 68 %
Platelets: 317 10*3/uL (ref 150–400)
RBC: 4.42 MIL/uL (ref 3.87–5.11)
RDW: 13 % (ref 11.5–15.5)
WBC: 9.8 10*3/uL (ref 4.0–10.5)

## 2017-07-04 LAB — I-STAT CHEM 8, ED
BUN: 7 mg/dL (ref 6–20)
CREATININE: 0.6 mg/dL (ref 0.44–1.00)
Calcium, Ion: 1.13 mmol/L — ABNORMAL LOW (ref 1.15–1.40)
Chloride: 102 mmol/L (ref 101–111)
GLUCOSE: 130 mg/dL — AB (ref 65–99)
HEMATOCRIT: 42 % (ref 36.0–46.0)
Hemoglobin: 14.3 g/dL (ref 12.0–15.0)
Potassium: 3.8 mmol/L (ref 3.5–5.1)
Sodium: 152 mmol/L — ABNORMAL HIGH (ref 135–145)
TCO2: 25 mmol/L (ref 22–32)

## 2017-07-04 LAB — INFLUENZA PANEL BY PCR (TYPE A & B)
Influenza A By PCR: NEGATIVE
Influenza B By PCR: NEGATIVE

## 2017-07-04 LAB — I-STAT CG4 LACTIC ACID, ED: Lactic Acid, Venous: 1.45 mmol/L (ref 0.5–1.9)

## 2017-07-04 MED ORDER — SODIUM CHLORIDE 0.9 % IV BOLUS (SEPSIS)
1000.0000 mL | Freq: Once | INTRAVENOUS | Status: AC
  Administered 2017-07-04: 1000 mL via INTRAVENOUS

## 2017-07-04 MED ORDER — CETIRIZINE-PSEUDOEPHEDRINE ER 5-120 MG PO TB12: 1.0000 | ORAL_TABLET | Freq: Every day | ORAL | 0 refills | Status: DC

## 2017-07-04 MED ORDER — BENZONATATE 100 MG PO CAPS: 100.0000 mg | ORAL_CAPSULE | Freq: Three times a day (TID) | ORAL | 0 refills | Status: DC

## 2017-07-04 MED ORDER — HYDROCOD POLST-CPM POLST ER 10-8 MG/5ML PO SUER
5.0000 mL | Freq: Once | ORAL | Status: AC
  Administered 2017-07-04: 5 mL via ORAL
  Filled 2017-07-04: qty 5

## 2017-07-04 MED ORDER — KETOROLAC TROMETHAMINE 30 MG/ML IJ SOLN
30.0000 mg | Freq: Once | INTRAMUSCULAR | Status: AC
  Administered 2017-07-04: 30 mg via INTRAVENOUS
  Filled 2017-07-04: qty 1

## 2017-07-04 NOTE — Discharge Instructions (Signed)
Rest, drink plenty of fluids. Tylenol or Motrin for any fever or pain. Take Tessalon as prescribed for cough. Take Zyrtec for congestion. Follow up with her family doctor if not improving or worsening symptoms in the next 3 days.

## 2017-07-04 NOTE — ED Provider Notes (Signed)
[place] DEPT Provider Note   CSN: [id number] Arrival date & time: 07/03/17  2229     History   Chief Complaint Chief Complaint  Patient presents with  . Influenza    HPI Liliahna Cudd is a 61 y.o. female.  HPI Sophy Mesler is a 61 y.o. female history of hypertension, anxiety, non-Hodgkin's lymphoma, chronic pain, presents to emergency department complaining of flulike symptoms. Patient states her symptoms began yesterday. She states she is having nasal congestion, sore throat, cough. Today has developed chest pain with coughing. He reports subjective fever did not check her temperature at home. Reports chills. Took some cough syrup she had left over from last year which did not help. He denies any neck pain or stiffness. Reports headache. No nausea or vomiting. No diarrhea. No chest pain or abdominal pain. Denies urinary symptoms. She has not had her influenza or pneumonia shot this year.  Past Medical History:  Diagnosis Date  . Anxiety   . Chronic neck pain    with radiculopathy  . Chronic pain of left wrist 2009   "since neck surgery"  . Complication of anesthesia    Hard to wake up per pt; after neck sx had to be bagged.  Marland Kitchen DEPRESSION   . DISC DISEASE, CERVICAL   . Diverticulitis   . HYPERLIPIDEMIA   . Hypertension   . Lymphocytosis 01/02/2016  . NEPHROLITHIASIS, HX OF   . NHL (non-Hodgkin's lymphoma) ([place]) 12/25/2014  . Recurrent abdominal pain   . Umbilical hernia   . UTI'S, CHRONIC     Patient Active Problem List   Diagnosis Date Noted  . Cutaneous horn 03/11/2017  . Prediabetes 10/13/2016  . Lymphocytosis (symptomatic) 01/02/2016  . Essential hypertension 01/02/2016  . Bilateral carpal tunnel syndrome 01/29/2015  . Low grade lymphoma, stage I ([place]) 12/25/2014  . Visit for screening mammogram 08/24/2014  . Constipation 10/03/2013  . Routine health maintenance 10/20/2011  . Hyperlipidemia with target LDL less than 130 12/22/2010    . Generalized OA 12/22/2010  . [place] DISEASE, CERVICAL 12/22/2010    Past Surgical History:  Procedure Laterality Date  . ABDOMINAL HYSTERECTOMY  2001  . APPENDECTOMY  1981  . KIDNEY STONE SURGERY  2007  . LEFT OOPHORECTOMY  2001  . MYOMECTOMY  1981  . NECK SURGERY  2009   fusion of C1, C2, C3    OB History    No data available       Home Medications    Prior to Admission medications   Medication Sig Start Date End Date Taking? Authorizing Provider  atorvastatin (LIPITOR) 20 MG tablet Take 1 tablet (20 mg total) by mouth daily. 10/14/16   Janith Lima, MD  chlorthalidone (HYGROTON) 25 MG tablet Take 0.5 tablets (12.5 mg total) by mouth daily. 11/19/16   Janith Lima, MD  telmisartan (MICARDIS) 40 MG tablet TAKE 1 TABLET(40 MG) BY MOUTH DAILY 04/19/17   Janith Lima, MD    Family History Family History  Problem Relation Age of Onset  . Hypertension Mother   . Diabetes Mother   . Hyperlipidemia Mother   . Heart attack Father        MI  . Hyperlipidemia Father   . Hypertension Father   . Diabetes Father   . Heart disease Father   . Cancer Neg Hx        Negative for Breast or Colon Cancer  . Colon cancer Neg Hx     Social History Social  History  Substance Use Topics  . Smoking status: Former Smoker    Packs/day: 0.25    Years: 30.00    Types: Cigarettes  . Smokeless tobacco: Never Used     Comment: stopped around march   . Alcohol use No     Allergies   Ciprofloxacin; Ofloxacin; and Other   Review of Systems Review of Systems  Constitutional: Positive for chills and fever.  HENT: Positive for congestion and sore throat. Negative for ear pain.   Respiratory: Negative for cough, chest tightness and shortness of breath.   Cardiovascular: Negative for chest pain, palpitations and leg swelling.  Gastrointestinal: Negative for abdominal pain, diarrhea, nausea and vomiting.  Genitourinary: Negative for dysuria, flank pain and pelvic pain.   Musculoskeletal: Positive for myalgias. Negative for arthralgias, neck pain and neck stiffness.  Skin: Negative for rash.  Neurological: Positive for headaches. Negative for dizziness and weakness.  All other systems reviewed and are negative.    Physical Exam Updated Vital Signs BP (!) 164/88 (BP Location: Left Arm)   Pulse 99   Temp 99.8 F (37.7 C) (Oral)   Resp 16   Ht 5\' 6"  (1.676 m)   Wt 72.6 kg (160 lb)   SpO2 100%   BMI 25.82 kg/m   Physical Exam  Constitutional: She is oriented to person, place, and time. She appears well-developed and well-nourished. No distress.  HENT:  Head: Normocephalic and atraumatic.  Right Ear: Tympanic membrane, external ear and ear canal normal.  Left Ear: Tympanic membrane, external ear and ear canal normal.  Nose: Mucosal edema and rhinorrhea present.  Mouth/Throat: Uvula is midline and mucous membranes are normal. Posterior oropharyngeal erythema present. No oropharyngeal exudate, posterior oropharyngeal edema or tonsillar abscesses.  Eyes: Conjunctivae are normal.  Neck: Normal range of motion. Neck supple.  Cardiovascular: Normal rate, regular rhythm, normal heart sounds and intact distal pulses.   Pulmonary/Chest: Effort normal and breath sounds normal. No respiratory distress. She has no wheezes. She has no rales.  Abdominal: Soft. Bowel sounds are normal. She exhibits no distension. There is no tenderness. There is no rebound.  Musculoskeletal: Normal range of motion. She exhibits no edema.  Neurological: She is alert and oriented to person, place, and time.  Skin: Skin is warm and dry.  Psychiatric: She has a normal mood and affect. Her behavior is normal.  Nursing note and vitals reviewed.    ED Treatments / Results  Labs (all labs ordered are listed, but only abnormal results are displayed) Labs Reviewed  INFLUENZA PANEL BY PCR (TYPE A & B)  CBC WITH DIFFERENTIAL/PLATELET  I-STAT CHEM 8, ED  I-STAT CG4 LACTIC ACID, ED     EKG  EKG Interpretation None       Radiology Dg Chest 2 View  Result Date: 07/03/2017 CLINICAL DATA:  Body ache, chills and nasal drainage. Fever with productive cough x2 days. Former smoker. EXAM: CHEST  2 VIEW COMPARISON:  None. FINDINGS: The heart size and mediastinal contours are within normal limits. No pneumonic consolidation, effusion or CHF. Mild chronic bronchitic change of the lungs with mild increased interstitial prominence bilaterally. ACDF of the mid to lower cervical spine. The visualized skeletal structures are nonacute otherwise and unremarkable. IMPRESSION: 1. Chronic bronchitic change. 2. No pneumonic consolidation. 3. Hardware from ACDF of the mid to lower cervical spine noted. Electronically Signed   By: Ashley Royalty M.D.   On: 07/03/2017 23:27    Procedures Procedures (including critical care time)  Medications Ordered  in ED Medications  sodium chloride 0.9 % bolus 1,000 mL (not administered)  ketorolac (TORADOL) 30 MG/ML injection 30 mg (not administered)  chlorpheniramine-HYDROcodone (TUSSIONEX) 10-8 MG/5ML suspension 5 mL (not administered)     Initial Impression / Assessment and Plan / ED Course  I have reviewed the triage vital signs and the nursing notes.  Pertinent labs & imaging results that were available during my care of the patient were reviewed by me and considered in my medical decision making (see chart for details).     Patients with flulike symptoms onset yesterday. Her temperature here is 99.8, otherwise normal vital signs. Influenza panel and chest x-ray was obtained at triage and negative. Given her past medical history, we'll check labs, give fluids, give Toradol for pain in this and asked for cough.  6:01 AM Sodium elevated at 152, otherwise unremarkable labs. Chest x-ray is negative. Influenza panel was negative. Patient hydrated with saline. She is feeling better. Ambulatory. Cough improved with Tussionex. Most likely a viral upper  respiratory infection. Will discharge home, advised to drink plenty of fluids. We'll provide with Tessalon Perles for cough. Tylenol Motrin for any fever. Rest. Follow-up with family doctor as needed. Return precautions discussed.   Vitals:   07/03/17 2234 07/03/17 2238 07/04/17 0130 07/04/17 0230  BP: (!) 164/88  (!) 159/89 (!) 155/83  Pulse: 99     Resp: 16  18   Temp: 99.8 F (37.7 C)     TempSrc: Oral     SpO2: 100%     Weight:  72.6 kg (160 lb)    Height:  5\' 6"  (1.676 m)       Final Clinical Impressions(s) / ED Diagnoses   Final diagnoses:  Flu-like symptoms  Hypernatremia    New Prescriptions New Prescriptions   BENZONATATE (TESSALON) 100 MG CAPSULE    Take 1 capsule (100 mg total) by mouth every 8 (eight) hours.   CETIRIZINE-PSEUDOEPHEDRINE (ZYRTEC-D) 5-120 MG TABLET    Take 1 tablet by mouth daily.     Jeannett Senior, PA-C 07/04/17 1478    Orpah Greek, MD 07/12/17 [phone]

## 2017-10-14 ENCOUNTER — Ambulatory Visit: Payer: Medicare Other | Admitting: Internal Medicine

## 2017-10-25 ENCOUNTER — Other Ambulatory Visit: Payer: Self-pay | Admitting: Internal Medicine

## 2017-10-25 DIAGNOSIS — I10 Essential (primary) hypertension: Secondary | ICD-10-CM

## 2017-10-25 DIAGNOSIS — R739 Hyperglycemia, unspecified: Secondary | ICD-10-CM

## 2017-12-02 ENCOUNTER — Ambulatory Visit: Payer: Medicare Other | Admitting: Family Medicine

## 2017-12-05 NOTE — Progress Notes (Signed)
[name] [address] [place], [place] [address] Phone: [phone] Subjective:    I'm seeing this patient by the request  of:  Janith Lima, MD   CC: Bilateral arm pain and weakness  [id number]  Lauren Orozco is a 62 y.o. female coming in with complaint of bilateral arm pain. Right side is worse with neck pain. Both hands numb right worse than left. Has been wearing a brace at night.   Onset- 2004 Location- Upper trap, ulnar side of right forearm Duration-  Character- Throbbing, weak Aggravating factors- Wakes her up at night Reliving factors-  Therapies tried- Heat, Topical, Ibuprofen   Severity-9 out of 10   Patient's last x-rays of the neck were taken in November 2015.  These were independently visualized by me showing the patient does have an anterior cervical fusion from C3 through C7.  Past Medical History:  Diagnosis Date  . Anxiety   . Chronic neck pain    with radiculopathy  . Chronic pain of left wrist 2009   "since neck surgery"  . Complication of anesthesia    Hard to wake up per pt; after neck sx had to be bagged.  Marland Kitchen DEPRESSION   . DISC DISEASE, CERVICAL   . Diverticulitis   . HYPERLIPIDEMIA   . Hypertension   . Lymphocytosis 01/02/2016  . NEPHROLITHIASIS, HX OF   . NHL (non-Hodgkin's lymphoma) ([place]) 12/25/2014  . Recurrent abdominal pain   . Umbilical hernia   . UTI'S, CHRONIC    Past Surgical History:  Procedure Laterality Date  . ABDOMINAL HYSTERECTOMY  2001  . APPENDECTOMY  1981  . KIDNEY STONE SURGERY  2007  . LEFT OOPHORECTOMY  2001  . MYOMECTOMY  1981  . NECK SURGERY  2009   fusion of C1, C2, C3   Social History   Socioeconomic History  . Marital status: Single    Spouse name: None  . Number of children: 1  . Years of education: HSG  . Highest education level: None  Social Needs  . Financial resource strain: None  . Food insecurity - worry: None  . Food insecurity - inability: None    . Transportation needs - medical: None  . Transportation needs - non-medical: None  Occupational History  . Occupation: Disabled  Tobacco Use  . Smoking status: Former Smoker    Packs/day: 0.25    Years: 30.00    Pack years: 7.50    Types: Cigarettes  . Smokeless tobacco: Never Used  . Tobacco comment: stopped around march   Substance and Sexual Activity  . Alcohol use: No  . Drug use: No  . Sexual activity: Not Currently  Other Topics Concern  . 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 Daughter in '91 [place]. Work-disability due to neck and arthritis problems. Lives-alone. No history of physical or sexual abuse   Allergies  Allergen Reactions  . Ciprofloxacin Hives    All floxacin drugs Burn-like hives  . Ofloxacin Hives    Burn like hives  . Other Other (See Comments)    All NUTS and corn due to diverticulitis   Family History  Problem Relation Age of Onset  . Hypertension Mother   . Diabetes Mother   . Hyperlipidemia Mother   . Heart attack Father        MI  . Hyperlipidemia Father   . Hypertension Father   . Diabetes Father   .  Heart disease Father   . Cancer Neg Hx        Negative for Breast or Colon Cancer  . Colon cancer Neg Hx      Past medical history, social, surgical and family history all reviewed in electronic medical record.  No pertanent information unless stated regarding to the chief complaint.   Review of Systems:Review of systems updated and as accurate as of 12/06/17  No headache, visual changes, nausea, vomiting, diarrhea, constipation, dizziness, abdominal pain, skin rash, fevers, chills, night sweats, weight loss, swollen lymph nodes, body aches, joint swelling, , chest pain, shortness of breath, mood changes.  Positive muscle aches  Objective  Blood pressure (!) 150/84, pulse 81, height 5\' 6"  (1.676 m), weight 157 lb (71.2 kg), SpO2 98 %. Systems examined below as of 12/06/17   General: No  apparent distress alert and oriented x3 mood and affect normal, dressed appropriately.  HEENT: Pupils equal, extraocular movements intact  Respiratory: Patient's speak in full sentences and does not appear short of breath  Cardiovascular: No lower extremity edema, non tender, no erythema  Skin: Warm dry intact with no signs of infection or rash on extremities or on axial skeleton.  Abdomen: Soft nontender  Neuro: Cranial nerves II through XII are intact, neurovascularly intact in all extremities with 2+ DTRs and 2+ pulses.  Lymph: No lymphadenopathy of posterior or anterior cervical chain or axillae bilaterally.  Gait normal with good balance and coordination.  MSK:  Non tender with full range of motion and good stability and symmetric strength and tone of shoulders, elbows, wrist, hip, knee and ankles bilaterally.  Arthritic changes of multiple joints Patient does have a mild positive Tinel sign on the right Neck exam shows significant loss of range of motion in all planes.  Patient has only 5 degrees of forward flexion, and 5 degrees of extension and minimal side bending bilaterally.  Mild passive range of motion.  Positive Spurling's with radicular symptoms bilateral.  4 out of 5 strength of the C8 distribution on the right compared to the left.  Mild decrease in grip strength on the right   Impression and Recommendations:     This case required medical decision making of moderate complexity.      Note: This dictation was prepared with Dragon dictation along with smaller phrase technology. Any transcriptional errors that result from this process are unintentional.

## 2017-12-06 ENCOUNTER — Encounter: Payer: Self-pay | Admitting: Family Medicine

## 2017-12-06 ENCOUNTER — Ambulatory Visit (INDEPENDENT_AMBULATORY_CARE_PROVIDER_SITE_OTHER)
Admission: RE | Admit: 2017-12-06 | Discharge: 2017-12-06 | Disposition: A | Discharge status: Home / Self Care | Payer: Medicare Other | Source: Ambulatory Visit | Attending: Family Medicine | Admitting: Family Medicine

## 2017-12-06 ENCOUNTER — Ambulatory Visit: Payer: Medicare Other | Admitting: Family Medicine

## 2017-12-06 VITALS — BP 150/84 | HR 81 | Ht 66.0 in | Wt 157.0 lb

## 2017-12-06 DIAGNOSIS — M5030 Other cervical disc degeneration, unspecified cervical region: Secondary | ICD-10-CM | POA: Diagnosis not present

## 2017-12-06 DIAGNOSIS — M4322 Fusion of spine, cervical region: Secondary | ICD-10-CM | POA: Diagnosis not present

## 2017-12-06 DIAGNOSIS — M542 Cervicalgia: Secondary | ICD-10-CM

## 2017-12-06 MED ORDER — VITAMIN D (ERGOCALCIFEROL) 1.25 MG (50000 UNIT) PO CAPS: 50000.0000 [IU] | ORAL_CAPSULE | ORAL | 0 refills | Status: DC

## 2017-12-06 MED ORDER — GABAPENTIN 100 MG PO CAPS: 200.0000 mg | ORAL_CAPSULE | Freq: Every day | ORAL | 3 refills | Status: DC

## 2017-12-06 NOTE — Patient Instructions (Addendum)
Good to see you  Xrays downstairs.  Once weekly vitamin D for 12 weeks.  Gabapentin 200mg  at night Try the sample pennsaid pinkie amount topically 2 times daily as needed.   Over the counter get  Turmeric 500mg  daily  Tart cherry extract any dose at night B12 1073mcg daily  B6 200mg  daily  Exercises 3 times a week.  Keep hands within peripheral vision  See me again in 4 weeks

## 2017-12-06 NOTE — Assessment & Plan Note (Signed)
Patient has had an anterior fusion and I do believe that most of patient's radicular symptoms seems to be worsening at this time.  I believe that this is likely secondary to more adjacent segment disease causing the C8 nerve root impingement.  Discussed with patient about icing regimen, gabapentin, vitamin D.  X-rays ordered today.  Encourage patient to potentially follow-up with her neurosurgeon.  Follow-up with me again in 4 weeks

## 2017-12-15 ENCOUNTER — Encounter: Payer: Self-pay | Admitting: Internal Medicine

## 2017-12-15 ENCOUNTER — Telehealth: Payer: Self-pay | Admitting: Internal Medicine

## 2017-12-15 ENCOUNTER — Ambulatory Visit (INDEPENDENT_AMBULATORY_CARE_PROVIDER_SITE_OTHER): Payer: Medicare Other | Admitting: Internal Medicine

## 2017-12-15 VITALS — BP 152/92 | HR 89 | Temp 99.0°F | Resp 18 | Wt 156.0 lb

## 2017-12-15 DIAGNOSIS — J209 Acute bronchitis, unspecified: Secondary | ICD-10-CM | POA: Insufficient documentation

## 2017-12-15 DIAGNOSIS — J0110 Acute frontal sinusitis, unspecified: Secondary | ICD-10-CM | POA: Diagnosis not present

## 2017-12-15 MED ORDER — HYDROCODONE-HOMATROPINE 5-1.5 MG/5ML PO SYRP: 5.0000 mL | ORAL_SOLUTION | Freq: Three times a day (TID) | ORAL | 0 refills | Status: DC | PRN

## 2017-12-15 MED ORDER — CEFDINIR 300 MG PO CAPS: 300.0000 mg | ORAL_CAPSULE | Freq: Two times a day (BID) | ORAL | 0 refills | Status: DC

## 2017-12-15 NOTE — Telephone Encounter (Signed)
Copied from [place] [phone]. Topic: Quick Communication - See Telephone Encounter >> Dec 15, 2017  8:57 AM Burnis Medin, NT wrote: CRM for notification. See Telephone encounter for: Patient called and wanted to see if the doctor  can call her back regarding her not feeling good. Patient wants to speak directly to the nurse.   12/15/17.

## 2017-12-15 NOTE — Telephone Encounter (Signed)
Contacted pt and have scheduled her to see Dr. Quay Burow today at 1:00pm

## 2017-12-15 NOTE — Assessment & Plan Note (Signed)
Likely bacterial  Start omnicef Prescription cough syrup otc cold medications Rest, fluid Call if no improvement

## 2017-12-15 NOTE — Progress Notes (Signed)
Subjective:    Patient ID: Lauren Orozco, female    DOB: [birth year], 62 y.o.   MRN: [id number]  HPI She is here for an acute visit for cold symptoms.  Her symptoms started one week ago  She is experiencing fatigue, nasal congestion with green mucus, headaches, dizziness/fogginess, fever/chills, sore throat.    She has old prescription cough syrup and has been gargling.    Medications and allergies reviewed with patient and updated if appropriate.  Patient Active Problem List   Diagnosis Date Noted  . Cutaneous horn 03/11/2017  . Prediabetes 10/13/2016  . Lymphocytosis (symptomatic) 01/02/2016  . Essential hypertension 01/02/2016  . Bilateral carpal tunnel syndrome 01/29/2015  . Low grade lymphoma, stage I ([place]) 12/25/2014  . Visit for screening mammogram 08/24/2014  . Constipation 10/03/2013  . Routine health maintenance 10/20/2011  . Hyperlipidemia with target LDL less than 130 12/22/2010  . Generalized OA 12/22/2010  . [place] DISEASE, CERVICAL 12/22/2010    Current Outpatient Medications on File Prior to Visit  Medication Sig Dispense Refill  . benzonatate (TESSALON) 100 MG capsule Take 1 capsule (100 mg total) by mouth every 8 (eight) hours. 21 capsule 0  . cetirizine-pseudoephedrine (ZYRTEC-D) 5-120 MG tablet Take 1 tablet by mouth daily. 30 tablet 0  . gabapentin (NEURONTIN) 100 MG capsule Take 2 capsules (200 mg total) by mouth at bedtime. 60 capsule 3  . telmisartan (MICARDIS) 40 MG tablet TAKE 1 TABLET(40 MG) BY MOUTH DAILY 90 tablet 0  . Vitamin D, Ergocalciferol, (DRISDOL) 50000 units CAPS capsule Take 1 capsule (50,000 Units total) by mouth every 7 (seven) days. 12 capsule 0   No current facility-administered medications on file prior to visit.     Past Medical History:  Diagnosis Date  . Anxiety   . Chronic neck pain    with radiculopathy  . Chronic pain of left wrist 2009   "since neck surgery"  . Complication of anesthesia    Hard to wake up  per pt; after neck sx had to be bagged.  Marland Kitchen DEPRESSION   . DISC DISEASE, CERVICAL   . Diverticulitis   . HYPERLIPIDEMIA   . Hypertension   . Lymphocytosis 01/02/2016  . NEPHROLITHIASIS, HX OF   . NHL (non-Hodgkin's lymphoma) ([place]) 12/25/2014  . Recurrent abdominal pain   . Umbilical hernia   . UTI'S, CHRONIC     Past Surgical History:  Procedure Laterality Date  . ABDOMINAL HYSTERECTOMY  2001  . APPENDECTOMY  1981  . KIDNEY STONE SURGERY  2007  . LEFT OOPHORECTOMY  2001  . MYOMECTOMY  1981  . NECK SURGERY  2009   fusion of C1, C2, C3    Social History   Socioeconomic History  . Marital status: Single    Spouse name: None  . Number of children: 1  . Years of education: HSG  . Highest education level: None  Social Needs  . Financial resource strain: None  . Food insecurity - worry: None  . Food insecurity - inability: None  . Transportation needs - medical: None  . Transportation needs - non-medical: None  Occupational History  . Occupation: Disabled  Tobacco Use  . Smoking status: Former Smoker    Packs/day: 0.25    Years: 30.00    Pack years: 7.50    Types: Cigarettes  . Smokeless tobacco: Never Used  . Tobacco comment: stopped around march   Substance and Sexual Activity  . Alcohol use: No  . Drug use: No  .  Sexual activity: Not Currently  Other Topics Concern  . 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 Work-disability due to neck and arthritis problems. Lives-alone. No history of physical or sexual abuse    Family History  Problem Relation Age of Onset  . Hypertension Mother   . Diabetes Mother   . Hyperlipidemia Mother   . Heart attack Father        MI  . Hyperlipidemia Father   . Hypertension Father   . Diabetes Father   . Heart disease Father   . Cancer Neg Hx        Negative for Breast or Colon Cancer  . Colon cancer Neg Hx     Review of Systems  Constitutional: Positive  for appetite change (dec), chills and fever (low grade).  HENT: Positive for congestion (green mucus) and sore throat. Negative for ear pain (ear pressure), sinus pressure and sinus pain.   Respiratory: Positive for cough (productive of green sputum). Negative for shortness of breath and wheezing.   Gastrointestinal: Negative for diarrhea and nausea.  Musculoskeletal: Negative for myalgias.  Neurological: Positive for dizziness (foggy) and headaches. Negative for light-headedness.       Objective:   Vitals:   12/15/17 1308  BP: (!) 152/92  Pulse: 89  Resp: 18  Temp: 99 F (37.2 C)  SpO2: 98%   Filed Weights   12/15/17 1308  Weight: 156 lb (70.8 kg)   Body mass index is 25.18 kg/m.  Wt Readings from Last 3 Encounters:  12/15/17 156 lb (70.8 kg)  12/06/17 157 lb (71.2 kg)  07/03/17 160 lb (72.6 kg)     Physical Exam GENERAL APPEARANCE: mildly ill appearing, NAD EYES: conjunctiva clear, no icterus HEENT: bilateral tympanic membranes and ear canals normal, oropharynx with mild erythema, no thyromegaly, trachea midline, no cervical or supraclavicular lymphadenopathy LUNGS: Clear to auscultation without wheeze or crackles, unlabored breathing, good air entry bilaterally CARDIOVASCULAR: Normal S1,S2 without murmurs, no edema SKIN: warm, dry        Assessment & Plan:   See Problem List for Assessment and Plan of chronic medical problems.

## 2017-12-15 NOTE — Patient Instructions (Signed)
Take the antibiotic as prescribed and use the cough medication as needed.   You can take over the counter cold medications as needed.   Increase rest and fluids.  Call if no improvement

## 2017-12-30 ENCOUNTER — Other Ambulatory Visit: Payer: Self-pay | Admitting: Hematology and Oncology

## 2017-12-30 DIAGNOSIS — C8590 Non-Hodgkin lymphoma, unspecified, unspecified site: Secondary | ICD-10-CM

## 2017-12-31 ENCOUNTER — Encounter: Payer: Self-pay | Admitting: Hematology and Oncology

## 2017-12-31 ENCOUNTER — Inpatient Hospital Stay: Payer: Medicare Other

## 2017-12-31 ENCOUNTER — Inpatient Hospital Stay: Payer: Medicare Other | Attending: Hematology and Oncology | Admitting: Hematology and Oncology

## 2017-12-31 ENCOUNTER — Telehealth: Payer: Self-pay | Admitting: Hematology and Oncology

## 2017-12-31 VITALS — BP 144/86 | HR 82 | Temp 98.4°F | Resp 18 | Ht 66.0 in | Wt 156.3 lb

## 2017-12-31 DIAGNOSIS — C8590 Non-Hodgkin lymphoma, unspecified, unspecified site: Secondary | ICD-10-CM

## 2017-12-31 DIAGNOSIS — Z720 Tobacco use: Secondary | ICD-10-CM

## 2017-12-31 DIAGNOSIS — F17210 Nicotine dependence, cigarettes, uncomplicated: Secondary | ICD-10-CM | POA: Diagnosis not present

## 2017-12-31 DIAGNOSIS — Z79899 Other long term (current) drug therapy: Secondary | ICD-10-CM | POA: Diagnosis not present

## 2017-12-31 DIAGNOSIS — R591 Generalized enlarged lymph nodes: Secondary | ICD-10-CM | POA: Insufficient documentation

## 2017-12-31 LAB — CBC WITH DIFFERENTIAL/PLATELET
BASOS PCT: 1 %
Basophils Absolute: 0 10*3/uL (ref 0.0–0.1)
EOS ABS: 0.1 10*3/uL (ref 0.0–0.5)
EOS PCT: 3 %
HCT: 39.1 % (ref 34.8–46.6)
HEMOGLOBIN: 12.9 g/dL (ref 11.6–15.9)
LYMPHS ABS: 1.8 10*3/uL (ref 0.9–3.3)
Lymphocytes Relative: 45 %
MCH: 29.9 pg (ref 25.1–34.0)
MCHC: 33.1 g/dL (ref 31.5–36.0)
MCV: 90.4 fL (ref 79.5–101.0)
Monocytes Absolute: 0.4 10*3/uL (ref 0.1–0.9)
Monocytes Relative: 10 %
NEUTROS PCT: 41 %
Neutro Abs: 1.7 10*3/uL (ref 1.5–6.5)
PLATELETS: 399 10*3/uL (ref 145–400)
RBC: 4.32 MIL/uL (ref 3.70–5.45)
RDW: 13.6 % (ref 11.2–14.5)
WBC: 4.1 10*3/uL (ref 3.9–10.3)

## 2017-12-31 LAB — COMPREHENSIVE METABOLIC PANEL
ALBUMIN: 3.9 g/dL (ref 3.5–5.0)
ALT: 15 U/L (ref 0–55)
AST: 14 U/L (ref 5–34)
Alkaline Phosphatase: 79 U/L (ref 40–150)
Anion gap: 8 (ref 3–11)
BUN: 9 mg/dL (ref 7–26)
CHLORIDE: 106 mmol/L (ref 98–109)
CO2: 26 mmol/L (ref 22–29)
CREATININE: 0.81 mg/dL (ref 0.60–1.10)
Calcium: 9.6 mg/dL (ref 8.4–10.4)
GFR calc non Af Amer: >60 mL/min (ref >60)
GLUCOSE: 103 mg/dL (ref 70–140)
Potassium: 4.3 mmol/L (ref 3.5–5.1)
SODIUM: 140 mmol/L (ref 136–145)
Total Bilirubin: 0.4 mg/dL (ref 0.2–1.2)
Total Protein: 7.3 g/dL (ref 6.4–8.3)

## 2017-12-31 LAB — LACTATE DEHYDROGENASE: LDH: 176 U/L (ref 125–245)

## 2017-12-31 NOTE — Telephone Encounter (Signed)
Gave avs and calendar ° °

## 2017-12-31 NOTE — Progress Notes (Signed)
[place] OFFICE PROGRESS NOTE  Patient Care Team: Janith Lima, MD as PCP - General (Internal Medicine) Heath Lark, MD as Consulting Physician (Hematology and Oncology) Lyndal Pulley, DO as Attending Physician (Family Medicine) Alda Berthold, DO as Consulting Physician (Neurology)  ASSESSMENT & PLAN:  Low grade lymphoma, stage I ([place]) Even though she does not have peripheral lymphocytosis, she has enlarging right supraclavicular nodule with associated abnormality I plan to order CT scan of the chest for further evaluation and to exclude lymphoma  Tobacco abuse The patient continues to smoke but appears to be interested to quit smoking She would like to try to quit on her own  Lymphadenopathy of head and neck She has a large nodule at the base of her right neck/right supraclavicular region I will order CT scan of the chest for evaluation and she agreed to proceed   Orders Placed This Encounter  Procedures  . CT CHEST W CONTRAST    Standing Status:   Future    Standing Expiration Date:   01/01/2019    Order Specific Question:   If indicated for the ordered procedure, I authorize the administration of contrast media per Radiology protocol    Answer:   Yes    Order Specific Question:   Preferred imaging location?    Answer:   [facility]    Order Specific Question:   Radiology Contrast Protocol - do NOT remove file path    Answer:   [email or web address]    INTERVAL HISTORY: Please see below for problem oriented charting. She returns for lymphoma follow-up She is concerned about enlarging right nodule and lump over the right supraclavicular region She denies lymphadenopathy elsewhere She had recent upper respiratory tract illness, successfully treated with antibiotics She continues to smoke She denies anorexia, weight loss or abnormal night sweats  SUMMARY OF ONCOLOGIC HISTORY:   Low grade lymphoma, stage I ([place])   12/18/2014 Pathology Results    Accession: [id number] Flow cytometry showed NHL      01/01/2015 Imaging    CT scan of the chest was performed due to chest pressure and it came back negative.       REVIEW OF SYSTEMS:   Constitutional: Denies fevers, chills or abnormal weight loss Eyes: Denies blurriness of vision Ears, nose, mouth, throat, and face: Denies mucositis or sore throat Respiratory: Denies cough, dyspnea or wheezes Cardiovascular: Denies palpitation, chest discomfort or lower extremity swelling Gastrointestinal:  Denies nausea, heartburn or change in bowel habits Skin: Denies abnormal skin rashes Lymphatics: Denies new lymphadenopathy or easy bruising Neurological:Denies numbness, tingling or new weaknesses Behavioral/Psych: Mood is stable, no new changes  All other systems were reviewed with the patient and are negative.  I have reviewed the past medical history, past surgical history, social history and family history with the patient and they are unchanged from previous note.  ALLERGIES:  is allergic to ciprofloxacin; ofloxacin; and other.  MEDICATIONS:  Current Outpatient Medications  Medication Sig Dispense Refill  . cetirizine-pseudoephedrine (ZYRTEC-D) 5-120 MG tablet Take 1 tablet by mouth daily. 30 tablet 0  . gabapentin (NEURONTIN) 100 MG capsule Take 2 capsules (200 mg total) by mouth at bedtime. 60 capsule 3  . HYDROcodone-homatropine (HYCODAN) 5-1.5 MG/5ML syrup Take 5 mLs by mouth every 8 (eight) hours as needed for cough. 120 mL 0  . telmisartan (MICARDIS) 40 MG tablet TAKE 1 TABLET(40 MG) BY MOUTH DAILY 90 tablet 0  . Vitamin D, Ergocalciferol, (DRISDOL) 50000 units CAPS capsule  Take 1 capsule (50,000 Units total) by mouth every 7 (seven) days. 12 capsule 0   No current facility-administered medications for this visit.     PHYSICAL EXAMINATION: ECOG PERFORMANCE STATUS: 1 - Symptomatic but completely ambulatory  Vitals:   12/31/17 1120  BP: (!) 144/86   Pulse: 82  Resp: 18  Temp: 98.4 F (36.9 C)  SpO2: 100%   Filed Weights   12/31/17 1120  Weight: 156 lb 4.8 oz (70.9 kg)    GENERAL:alert, no distress and comfortable SKIN: skin color, texture, turgor are normal, no rashes or significant lesions EYES: normal, Conjunctiva are pink and non-injected, sclera clear OROPHARYNX:no exudate, no erythema and lips, buccal mucosa, and tongue normal  NECK: supple, thyroid normal size, non-tender, without nodularity LYMPH: She has a nodule and puffiness over the right supraclavicular region of unknown etiology LUNGS: clear to auscultation and percussion with normal breathing effort HEART: regular rate & rhythm and no murmurs and no lower extremity edema ABDOMEN:abdomen soft, non-tender and normal bowel sounds Musculoskeletal:no cyanosis of digits and no clubbing  NEURO: alert & oriented x 3 with fluent speech, no focal motor/sensory deficits  LABORATORY DATA:  I have reviewed the data as listed    Component Value Date/Time   NA 140 12/31/2017 1053   NA 140 01/02/2016 1034   K 4.3 12/31/2017 1053   K 4.0 01/02/2016 1034   CL 106 12/31/2017 1053   CO2 26 12/31/2017 1053   CO2 27 01/02/2016 1034   GLUCOSE 103 12/31/2017 1053   GLUCOSE 99 01/02/2016 1034   BUN 9 12/31/2017 1053   BUN 9.0 01/02/2016 1034   CREATININE 0.81 12/31/2017 1053   CREATININE 0.8 01/02/2016 1034   CALCIUM 9.6 12/31/2017 1053   CALCIUM 9.6 01/02/2016 1034   PROT 7.3 12/31/2017 1053   PROT 7.8 01/02/2016 1034   ALBUMIN 3.9 12/31/2017 1053   ALBUMIN 4.1 01/02/2016 1034   AST 14 12/31/2017 1053   AST 17 01/02/2016 1034   ALT 15 12/31/2017 1053   ALT 17 01/02/2016 1034   ALKPHOS 79 12/31/2017 1053   ALKPHOS 76 01/02/2016 1034   BILITOT 0.4 12/31/2017 1053   BILITOT 0.45 01/02/2016 1034   GFRNONAA >60 12/31/2017 1053   GFRAA >60 12/31/2017 1053    No results found for: SPEP, UPEP  Lab Results  Component Value Date   WBC 4.1 12/31/2017   NEUTROABS 1.7  12/31/2017   HGB 12.9 12/31/2017   HCT 39.1 12/31/2017   MCV 90.4 12/31/2017   PLT 399 12/31/2017      Chemistry      Component Value Date/Time   NA 140 12/31/2017 1053   NA 140 01/02/2016 1034   K 4.3 12/31/2017 1053   K 4.0 01/02/2016 1034   CL 106 12/31/2017 1053   CO2 26 12/31/2017 1053   CO2 27 01/02/2016 1034   BUN 9 12/31/2017 1053   BUN 9.0 01/02/2016 1034   CREATININE 0.81 12/31/2017 1053   CREATININE 0.8 01/02/2016 1034      Component Value Date/Time   CALCIUM 9.6 12/31/2017 1053   CALCIUM 9.6 01/02/2016 1034   ALKPHOS 79 12/31/2017 1053   ALKPHOS 76 01/02/2016 1034   AST 14 12/31/2017 1053   AST 17 01/02/2016 1034   ALT 15 12/31/2017 1053   ALT 17 01/02/2016 1034   BILITOT 0.4 12/31/2017 1053   BILITOT 0.45 01/02/2016 1034       RADIOGRAPHIC STUDIES: I have personally reviewed the radiological images as listed and  agreed with the findings in the report. Dg Cervical Spine Complete  Result Date: 12/06/2017 CLINICAL DATA:  Right side arm and hand weakness -chronic- worsening x 2 weeks. Hx of fusion. EXAM: CERVICAL SPINE - COMPLETE 4+ VIEW COMPARISON:  08/13/2014 FINDINGS: There has been a previous fusion spanning C3-C7. The anterior fusion plate and fixation screws appear well seated, unchanged with no evidence of loosening. There is mature bone graft material spanning the fused disc spaces. Moderate loss of disc height at C7-T1. No fracture.  No spondylolisthesis. Soft tissues are unremarkable. No change from the prior study. IMPRESSION: 1. No fracture, spondylolisthesis or acute finding. 2. Stable appearance of the C3 through C7 anterior cervical disc fusion. No evidence of loosening of the orthopedic hardware. 3. Moderate loss disc height at C7-T1 visualized only on the lateral swimmer's view. Electronically Signed   By: Lajean Manes M.D.   On: 12/06/2017 15:24    All questions were answered. The patient knows to call the clinic with any problems, questions or  concerns. No barriers to learning was detected.  I spent 15 minutes counseling the patient face to face. The total time spent in the appointment was 20 minutes and more than 50% was on counseling and review of test results  Heath Lark, MD 12/31/2017 12:01 PM

## 2017-12-31 NOTE — Assessment & Plan Note (Signed)
She has a large nodule at the base of her right neck/right supraclavicular region I will order CT scan of the chest for evaluation and she agreed to proceed

## 2017-12-31 NOTE — Assessment & Plan Note (Signed)
Even though she does not have peripheral lymphocytosis, she has enlarging right supraclavicular nodule with associated abnormality I plan to order CT scan of the chest for further evaluation and to exclude lymphoma

## 2017-12-31 NOTE — Assessment & Plan Note (Signed)
The patient continues to smoke but appears to be interested to quit smoking She would like to try to quit on her own

## 2018-01-07 ENCOUNTER — Encounter (HOSPITAL_COMMUNITY): Payer: Self-pay | Admitting: Radiology

## 2018-01-07 ENCOUNTER — Ambulatory Visit (HOSPITAL_COMMUNITY)
Admission: RE | Admit: 2018-01-07 | Discharge: 2018-01-07 | Disposition: A | Discharge status: Home / Self Care | Payer: Medicare Other | Source: Ambulatory Visit | Attending: Hematology and Oncology | Admitting: Hematology and Oncology

## 2018-01-07 DIAGNOSIS — R591 Generalized enlarged lymph nodes: Secondary | ICD-10-CM

## 2018-01-07 DIAGNOSIS — D3501 Benign neoplasm of right adrenal gland: Secondary | ICD-10-CM | POA: Diagnosis not present

## 2018-01-07 DIAGNOSIS — C8590 Non-Hodgkin lymphoma, unspecified, unspecified site: Secondary | ICD-10-CM

## 2018-01-07 MED ORDER — IOHEXOL 300 MG/ML  SOLN
75.0000 mL | Freq: Once | INTRAMUSCULAR | Status: AC | PRN
  Administered 2018-01-07: 75 mL via INTRAVENOUS

## 2018-01-10 ENCOUNTER — Telehealth: Payer: Self-pay | Admitting: Hematology and Oncology

## 2018-01-10 ENCOUNTER — Encounter: Payer: Self-pay | Admitting: Hematology and Oncology

## 2018-01-10 ENCOUNTER — Inpatient Hospital Stay: Payer: Medicare Other | Attending: Hematology and Oncology | Admitting: Hematology and Oncology

## 2018-01-10 DIAGNOSIS — Z8572 Personal history of non-Hodgkin lymphomas: Secondary | ICD-10-CM | POA: Diagnosis not present

## 2018-01-10 DIAGNOSIS — R591 Generalized enlarged lymph nodes: Secondary | ICD-10-CM | POA: Insufficient documentation

## 2018-01-10 DIAGNOSIS — Z79899 Other long term (current) drug therapy: Secondary | ICD-10-CM | POA: Insufficient documentation

## 2018-01-10 DIAGNOSIS — F17210 Nicotine dependence, cigarettes, uncomplicated: Secondary | ICD-10-CM | POA: Diagnosis not present

## 2018-01-10 DIAGNOSIS — Z720 Tobacco use: Secondary | ICD-10-CM

## 2018-01-10 DIAGNOSIS — C8590 Non-Hodgkin lymphoma, unspecified, unspecified site: Secondary | ICD-10-CM

## 2018-01-10 MED ORDER — NICOTINE 7 MG/24HR TD PT24: 7.0000 mg | MEDICATED_PATCH | Freq: Every day | TRANSDERMAL | 0 refills | Status: DC

## 2018-01-10 NOTE — Progress Notes (Signed)
[place] OFFICE PROGRESS NOTE  Patient Care Team: Janith Lima, MD as PCP - General (Internal Medicine) Heath Lark, MD as Consulting Physician (Hematology and Oncology) Lyndal Pulley, DO as Attending Physician (Family Medicine) Alda Berthold, DO as Consulting Physician (Neurology)  ASSESSMENT & PLAN:  Low grade lymphoma, stage I [facility]) CT scan showed no evidence of lymphoma She is bothered by the cyst over the area I recommend follow with primary care doctor or referral to see dermatologist for excision  Tobacco abuse She is interested to quit smoking We discussed the use of nicotine patch and slow taper of the number of cigarettes  We set a quit date on her birthday this year to quit smoking completely   No orders of the defined types were placed in this encounter.   INTERVAL HISTORY: Please see below for problem oriented charting. She returns with her sister for further follow-up She continues to be bothered by this cutaneous skin lesion over the supraclavicular region She is interested to quit smoking  SUMMARY OF ONCOLOGIC HISTORY:   Low grade lymphoma, stage I ([place])   12/18/2014 Pathology Results    Accession: [id number] Flow cytometry showed NHL      01/01/2015 Imaging    CT scan of the chest was performed due to chest pressure and it came back negative.       REVIEW OF SYSTEMS:  All other systems were reviewed with the patient and are negative.  I have reviewed the past medical history, past surgical history, social history and family history with the patient and they are unchanged from previous note.  ALLERGIES:  is allergic to ciprofloxacin; ofloxacin; and other.  MEDICATIONS:  Current Outpatient Medications  Medication Sig Dispense Refill  . cetirizine-pseudoephedrine (ZYRTEC-D) 5-120 MG tablet Take 1 tablet by mouth daily. 30 tablet 0  . gabapentin (NEURONTIN) 100 MG capsule Take 2 capsules (200 mg total) by mouth at bedtime. 60  capsule 3  . HYDROcodone-homatropine (HYCODAN) 5-1.5 MG/5ML syrup Take 5 mLs by mouth every 8 (eight) hours as needed for cough. 120 mL 0  . nicotine (NICODERM CQ - DOSED IN MG/24 HR) 7 mg/24hr patch Place 1 patch (7 mg total) onto the skin daily. 28 patch 0  . telmisartan (MICARDIS) 40 MG tablet TAKE 1 TABLET(40 MG) BY MOUTH DAILY 90 tablet 0  . Vitamin D, Ergocalciferol, (DRISDOL) 50000 units CAPS capsule Take 1 capsule (50,000 Units total) by mouth every 7 (seven) days. 12 capsule 0   No current facility-administered medications for this visit.     PHYSICAL EXAMINATION: ECOG PERFORMANCE STATUS: 0 - Asymptomatic  Vitals:   01/10/18 1118  BP: (!) 161/90  Pulse: 74  Resp: 18  Temp: 97.9 F (36.6 C)  SpO2: 100%   Filed Weights   01/10/18 1118  Weight: 156 lb 14.4 oz (71.2 kg)    GENERAL:alert, no distress and comfortable NEURO: alert & oriented x 3 with fluent speech, no focal motor/sensory deficits  LABORATORY DATA:  I have reviewed the data as listed    Component Value Date/Time   NA 140 12/31/2017 1053   NA 140 01/02/2016 1034   K 4.3 12/31/2017 1053   K 4.0 01/02/2016 1034   CL 106 12/31/2017 1053   CO2 26 12/31/2017 1053   CO2 27 01/02/2016 1034   GLUCOSE 103 12/31/2017 1053   GLUCOSE 99 01/02/2016 1034   BUN 9 12/31/2017 1053   BUN 9.0 01/02/2016 1034   CREATININE 0.81 12/31/2017 1053  CREATININE 0.8 01/02/2016 1034   CALCIUM 9.6 12/31/2017 1053   CALCIUM 9.6 01/02/2016 1034   PROT 7.3 12/31/2017 1053   PROT 7.8 01/02/2016 1034   ALBUMIN 3.9 12/31/2017 1053   ALBUMIN 4.1 01/02/2016 1034   AST 14 12/31/2017 1053   AST 17 01/02/2016 1034   ALT 15 12/31/2017 1053   ALT 17 01/02/2016 1034   ALKPHOS 79 12/31/2017 1053   ALKPHOS 76 01/02/2016 1034   BILITOT 0.4 12/31/2017 1053   BILITOT 0.45 01/02/2016 1034   GFRNONAA >60 12/31/2017 1053   GFRAA >60 12/31/2017 1053    No results found for: SPEP, UPEP  Lab Results  Component Value Date   WBC 4.1  12/31/2017   NEUTROABS 1.7 12/31/2017   HGB 12.9 12/31/2017   HCT 39.1 12/31/2017   MCV 90.4 12/31/2017   PLT 399 12/31/2017      Chemistry      Component Value Date/Time   NA 140 12/31/2017 1053   NA 140 01/02/2016 1034   K 4.3 12/31/2017 1053   K 4.0 01/02/2016 1034   CL 106 12/31/2017 1053   CO2 26 12/31/2017 1053   CO2 27 01/02/2016 1034   BUN 9 12/31/2017 1053   BUN 9.0 01/02/2016 1034   CREATININE 0.81 12/31/2017 1053   CREATININE 0.8 01/02/2016 1034      Component Value Date/Time   CALCIUM 9.6 12/31/2017 1053   CALCIUM 9.6 01/02/2016 1034   ALKPHOS 79 12/31/2017 1053   ALKPHOS 76 01/02/2016 1034   AST 14 12/31/2017 1053   AST 17 01/02/2016 1034   ALT 15 12/31/2017 1053   ALT 17 01/02/2016 1034   BILITOT 0.4 12/31/2017 1053   BILITOT 0.45 01/02/2016 1034       RADIOGRAPHIC STUDIES: I have personally reviewed the radiological images as listed and agreed with the findings in the report. Ct Chest W Contrast  Result Date: 01/10/2018 CLINICAL DATA:  Non-Hodgkin's lymphoma EXAM: CT CHEST WITH CONTRAST TECHNIQUE: Multidetector CT imaging of the chest was performed during intravenous contrast administration. CONTRAST:  58mL OMNIPAQUE IOHEXOL 300 MG/ML  SOLN COMPARISON:  Chest CT 01/01/2015 FINDINGS: Cardiovascular: No significant vascular findings. Normal heart size. No pericardial effusion. Mediastinum/Nodes: No axillary supraclavicular adenopathy. No mediastinal hilar adenopathy. No pericardial effusion. Esophagus normal. Lungs/Pleura: No suspicious pulmonary nodules.  Airways are normal. Upper Abdomen: RIGHT adrenal adenoma measures 18 mm in unchanged. Limited view of the liver, kidneys, pancreas are unremarkable. No aggressive osseous lesion. Musculoskeletal: No aggressive osseous lesion. Anterior cervical fusion IMPRESSION: 1. No evidence of lymphoma recurrence. 2. Benign RIGHT adrenal adenoma 3. Anterior cervical fusion. Electronically Signed   By: Suzy Bouchard M.D.    On: 01/10/2018 08:42    All questions were answered. The patient knows to call the clinic with any problems, questions or concerns. No barriers to learning was detected.  I spent 10 minutes counseling the patient face to face. The total time spent in the appointment was 15 minutes and more than 50% was on counseling and review of test results  Heath Lark, MD 01/10/2018 11:30 AM

## 2018-01-10 NOTE — Telephone Encounter (Signed)
Gave patient AVs and calendar of upcoming April 2020 appointments.  °

## 2018-01-10 NOTE — Assessment & Plan Note (Signed)
CT scan showed no evidence of lymphoma She is bothered by the cyst over the area I recommend follow with primary care doctor or referral to see dermatologist for excision

## 2018-01-10 NOTE — Assessment & Plan Note (Signed)
She is interested to quit smoking We discussed the use of nicotine patch and slow taper of the number of cigarettes  We set a quit date on her birthday this year to quit smoking completely

## 2018-01-13 ENCOUNTER — Encounter: Payer: Self-pay | Admitting: Internal Medicine

## 2018-01-13 ENCOUNTER — Other Ambulatory Visit: Payer: Medicare Other

## 2018-01-13 ENCOUNTER — Ambulatory Visit (INDEPENDENT_AMBULATORY_CARE_PROVIDER_SITE_OTHER): Payer: Medicare Other | Admitting: Internal Medicine

## 2018-01-13 ENCOUNTER — Telehealth: Payer: Self-pay | Admitting: Internal Medicine

## 2018-01-13 VITALS — BP 160/82 | HR 80 | Temp 98.2°F | Resp 16 | Ht 66.0 in | Wt 155.0 lb

## 2018-01-13 DIAGNOSIS — R229 Localized swelling, mass and lump, unspecified: Secondary | ICD-10-CM

## 2018-01-13 DIAGNOSIS — L0211 Cutaneous abscess of neck: Secondary | ICD-10-CM | POA: Insufficient documentation

## 2018-01-13 DIAGNOSIS — L859 Epidermal thickening, unspecified: Secondary | ICD-10-CM | POA: Diagnosis not present

## 2018-01-13 NOTE — Progress Notes (Signed)
Subjective:  Patient ID: Lauren Orozco, female    DOB: March 15, 1956  Age: 62 y.o. MRN: [id number]  CC: Cyst   HPI Romayne Orozco [facility] presents for concerns about a one-week history of painful, red, foul swelling, cyst in her right lower neck.  Outpatient Medications Prior to Visit  Medication Sig Dispense Refill  . gabapentin (NEURONTIN) 100 MG capsule Take 2 capsules (200 mg total) by mouth at bedtime. 60 capsule 3  . HYDROcodone-homatropine (HYCODAN) 5-1.5 MG/5ML syrup Take 5 mLs by mouth every 8 (eight) hours as needed for cough. 120 mL 0  . nicotine (NICODERM CQ - DOSED IN MG/24 HR) 7 mg/24hr patch Place 1 patch (7 mg total) onto the skin daily. 28 patch 0  . telmisartan (MICARDIS) 40 MG tablet TAKE 1 TABLET(40 MG) BY MOUTH DAILY 90 tablet 0  . Vitamin D, Ergocalciferol, (DRISDOL) 50000 units CAPS capsule Take 1 capsule (50,000 Units total) by mouth every 7 (seven) days. 12 capsule 0  . cetirizine-pseudoephedrine (ZYRTEC-D) 5-120 MG tablet Take 1 tablet by mouth daily. (Patient not taking: Reported on 01/13/2018) 30 tablet 0   No facility-administered medications prior to visit.     ROS Review of Systems  Constitutional: Negative for chills, fatigue and fever.  HENT: Negative.   Eyes: Negative.   Respiratory: Negative.   Cardiovascular: Negative.   Gastrointestinal: Negative.   Endocrine: Negative.   Genitourinary: Negative.   Musculoskeletal: Negative.   Skin: Positive for color change. Negative for pallor, rash and wound.  Neurological: Negative.   Hematological: Negative.   Psychiatric/Behavioral: Negative.     Objective:  BP (!) 160/82 (BP Location: Left Arm, Patient Position: Sitting, Cuff Size: Normal)   Pulse 80   Temp 98.2 F (36.8 C) (Oral)   Resp 16   Ht 5\' 6"  (1.676 m)   Wt 155 lb (70.3 kg)   SpO2 97%   BMI 25.02 kg/m   BP Readings from Last 3 Encounters:  01/13/18 (!) 160/82  01/10/18 (!) 161/90  12/31/17 (!) 144/86    Wt Readings  from Last 3 Encounters:  01/13/18 155 lb (70.3 kg)  01/10/18 156 lb 14.4 oz (71.2 kg)  12/31/17 156 lb 4.8 oz (70.9 kg)    Physical Exam  Neck:    The area was cleaned with Betadine and prepped/draped in sterile fashion.  Local anesthesia was obtained with instillation of 2 cc of 1% lidocaine with epinephrine.  A 6 mm punch incision was made and the contents of a necrotic and foul-smelling epidermal cyst was released.  There was no significant exudate. The cavity was cleaned with hydrogen peroxide and packed with iodoform.    Lab Results  Component Value Date   WBC 4.1 12/31/2017   HGB 12.9 12/31/2017   HCT 39.1 12/31/2017   PLT 399 12/31/2017   GLUCOSE 103 12/31/2017   CHOL 200 03/11/2017   TRIG 93.0 03/11/2017   HDL 57.60 03/11/2017   LDLDIRECT 150.8 10/19/2011   LDLCALC 124 (H) 03/11/2017   ALT 15 12/31/2017   AST 14 12/31/2017   NA 140 12/31/2017   K 4.3 12/31/2017   CL 106 12/31/2017   CREATININE 0.81 12/31/2017   BUN 9 12/31/2017   CO2 26 12/31/2017   TSH 0.60 10/13/2016   HGBA1C 6.6 (H) 03/11/2017    Ct Chest W Contrast  Result Date: 01/10/2018 CLINICAL DATA:  Non-Hodgkin's lymphoma EXAM: CT CHEST WITH CONTRAST TECHNIQUE: Multidetector CT imaging of the chest was performed during intravenous contrast administration. CONTRAST:  67mL OMNIPAQUE IOHEXOL 300 MG/ML  SOLN COMPARISON:  Chest CT 01/01/2015 FINDINGS: Cardiovascular: No significant vascular findings. Normal heart size. No pericardial effusion. Mediastinum/Nodes: No axillary supraclavicular adenopathy. No mediastinal hilar adenopathy. No pericardial effusion. Esophagus normal. Lungs/Pleura: No suspicious pulmonary nodules.  Airways are normal. Upper Abdomen: RIGHT adrenal adenoma measures 18 mm in unchanged. Limited view of the liver, kidneys, pancreas are unremarkable. No aggressive osseous lesion. Musculoskeletal: No aggressive osseous lesion. Anterior cervical fusion IMPRESSION: 1. No evidence of lymphoma  recurrence. 2. Benign RIGHT adrenal adenoma 3. Anterior cervical fusion. Electronically Signed   By: Suzy Bouchard M.D.   On: 01/10/2018 08:42    Assessment & Plan:   Blinda was seen today for cyst.  Diagnoses and all orders for this visit:  Abscess of skin of neck- I do not think this is secondarily infection but I did send off a culture just in case.  If that is positive and if the area does not heal quickly then will consider an antibiotic but for now I do not think antibiotic therapy is indicated.  A successful incision and drainage was done and the area was packed.  She will leave the iodoform packing in for about 2 days and she will return to follow with me to recheck the area in about 3-4 days. -     WOUND CULTURE; Future  Mass of skin- The cyst is sent for pathology to be certain that there is not a malignant component. -     Dermatology pathology; Future   I am having Christena Sunderlin. Ridley maintain her cetirizine-pseudoephedrine, telmisartan, Vitamin D (Ergocalciferol), gabapentin, HYDROcodone-homatropine, and nicotine.  No orders of the defined types were placed in this encounter.    Follow-up: Return in about 3 days (around 01/16/2018).  Lauren Calico, MD

## 2018-01-13 NOTE — Patient Instructions (Signed)
Incision and Drainage Incision and drainage is a surgical procedure to open and drain a fluid-filled sac. The sac may be filled with pus, mucus, or blood. Examples of fluid-filled sacs that may need surgical drainage include cysts, skin infections (abscesses), and red lumps that develop from a ruptured cyst or a small abscess (boils). You may need this procedure if the affected area is large, painful, infected, or not healing well. Tell a health care provider about:  Any allergies you have.  All medicines you are taking, including vitamins, herbs, eye drops, creams, and over-the-counter medicines.  Any problems you or family members have had with anesthetic medicines.  Any blood disorders you have.  Any surgeries you have had.  Any medical conditions you have.  Whether you are pregnant or may be pregnant. What are the risks? Generally, this is a safe procedure. However, problems may occur, including:  Infection.  Bleeding.  Allergic reactions to medicines.  Scarring.  What happens before the procedure?  You may need an ultrasound or other imaging tests to see how large or deep the fluid-filled sac is.  You may have blood tests to check for infection.  You may get a tetanus shot.  You may be given antibiotic medicine to help prevent infection.  Follow instructions from your health care provider about eating or drinking restrictions.  Ask your health care provider about: ? Changing or stopping your regular medicines. This is especially important if you are taking diabetes medicines or blood thinners. ? Taking medicines such as aspirin and ibuprofen. These medicines can thin your blood. Do not take these medicines before your procedure if your health care provider instructs you not to.  Plan to have someone take you home after the procedure.  If you will be going home right after the procedure, plan to have someone stay with you for 24 hours. What happens during the  procedure?  To reduce your risk of infection: ? Your health care team will wash or sanitize their hands. ? Your skin will be washed with soap.  You will be given one or more of the following: ? A medicine to help you relax (sedative). ? A medicine to numb the area (local anesthetic). ? A medicine to make you fall asleep (general anesthetic).  An incision will be made in the top of the fluid-filled sac.  The contents of the sac may be squeezed out, or a syringe or tube (catheter)may be used to empty the sac.  The catheter may be left in place for several weeks to drain any fluid. Or, your health care provider may stitch open the edges of the incision to make a long-term opening for drainage (marsupialization).  The inside of the sac may be washed out (irrigated) with a sterile solution and packed with gauze before it is covered with a bandage (dressing). The procedure may vary among health care providers and hospitals. What happens after the procedure?  Your blood pressure, heart rate, breathing rate, and blood oxygen level will be monitored often until the medicines you were given have worn off.  Do not drive for 24 hours if you received a sedative. This information is not intended to replace advice given to you by your health care provider. Make sure you discuss any questions you have with your health care provider. Document Released: 03/17/2001 Document Revised: 02/27/2016 Document Reviewed: 07/12/2015 Elsevier Interactive Patient Education  2018 [employer]  

## 2018-01-13 NOTE — Telephone Encounter (Signed)
Copied from [place] [id number]. Topic: Quick Communication - See Telephone Encounter >> Jan 13, 2018  3:15 PM Neva Seat wrote: Numbing medication has wore off from the shoulder procedure.   Pt is needing to know what she needs to do for the pain.

## 2018-01-14 NOTE — Telephone Encounter (Signed)
Can take aleve OR advil for pain if needed. Also okay to try tylenol. Ice may be helpful for pain. If no relief can take 86mL cough syrup if any leftover from March up to twice per day.

## 2018-01-14 NOTE — Telephone Encounter (Signed)
Pt contacted and informed of MD response. Pt states that she is feeling much better.

## 2018-01-16 ENCOUNTER — Encounter: Payer: Self-pay | Admitting: Internal Medicine

## 2018-01-16 LAB — WOUND CULTURE
MICRO NUMBER:: 90447844
SPECIMEN QUALITY: ADEQUATE

## 2018-01-17 ENCOUNTER — Encounter: Payer: Self-pay | Admitting: Internal Medicine

## 2018-01-17 ENCOUNTER — Ambulatory Visit (INDEPENDENT_AMBULATORY_CARE_PROVIDER_SITE_OTHER): Payer: Medicare Other | Admitting: Internal Medicine

## 2018-01-17 VITALS — BP 138/80 | HR 78 | Temp 98.2°F | Resp 16 | Ht 66.0 in | Wt 155.2 lb

## 2018-01-17 DIAGNOSIS — E785 Hyperlipidemia, unspecified: Secondary | ICD-10-CM

## 2018-01-17 DIAGNOSIS — E118 Type 2 diabetes mellitus with unspecified complications: Secondary | ICD-10-CM | POA: Diagnosis not present

## 2018-01-17 DIAGNOSIS — I10 Essential (primary) hypertension: Secondary | ICD-10-CM | POA: Diagnosis not present

## 2018-01-17 DIAGNOSIS — R739 Hyperglycemia, unspecified: Secondary | ICD-10-CM

## 2018-01-17 DIAGNOSIS — L0211 Cutaneous abscess of neck: Secondary | ICD-10-CM

## 2018-01-17 LAB — POCT GLYCOSYLATED HEMOGLOBIN (HGB A1C): Hemoglobin A1C: 6.3

## 2018-01-17 MED ORDER — TELMISARTAN 40 MG PO TABS: ORAL_TABLET | ORAL | 0 refills | Status: DC

## 2018-01-17 NOTE — Patient Instructions (Signed)

## 2018-01-17 NOTE — Progress Notes (Signed)
Subjective:  Patient ID: Lauren Orozco, female    DOB: [birth year]  Age: 62 y.o. MRN: [id number]  CC: Diabetes; Wound Check; and Hypertension   HPI Lauren Orozco presents for a recheck -he underwent incision and drainage of a cystic lesion over her right lower about 4 days ago.  She returns for follow-up.  She tells me the area feels much better with no more pain, swelling, redness, or drainage.  The culture was remarkable for skin flora and the pathology of the cystic lesion is unremarkable.  Outpatient Medications Prior to Visit  Medication Sig Dispense Refill  . gabapentin (NEURONTIN) 100 MG capsule Take 2 capsules (200 mg total) by mouth at bedtime. 60 capsule 3  . nicotine (NICODERM CQ - DOSED IN MG/24 HR) 7 mg/24hr patch Place 1 patch (7 mg total) onto the skin daily. 28 patch 0  . Vitamin D, Ergocalciferol, (DRISDOL) 50000 units CAPS capsule Take 1 capsule (50,000 Units total) by mouth every 7 (seven) days. 12 capsule 0  . cetirizine-pseudoephedrine (ZYRTEC-D) 5-120 MG tablet Take 1 tablet by mouth daily. 30 tablet 0  . HYDROcodone-homatropine (HYCODAN) 5-1.5 MG/5ML syrup Take 5 mLs by mouth every 8 (eight) hours as needed for cough. 120 mL 0  . telmisartan (MICARDIS) 40 MG tablet TAKE 1 TABLET(40 MG) BY MOUTH DAILY 90 tablet 0   No facility-administered medications prior to visit.     ROS Review of Systems  Constitutional: Negative.  Negative for chills, diaphoresis, fatigue and fever.  HENT: Negative.   Eyes: Negative for visual disturbance.  Respiratory: Negative for cough, chest tightness, shortness of breath and wheezing.   Cardiovascular: Negative for chest pain, palpitations and leg swelling.  Gastrointestinal: Negative for abdominal pain and diarrhea.  Endocrine: Negative.   Genitourinary: Negative.  Negative for difficulty urinating.  Musculoskeletal: Negative for arthralgias and joint swelling.  Skin: Positive for wound. Negative for color change.   Allergic/Immunologic: Negative.   Neurological: Negative.  Negative for dizziness.  Hematological: Negative for adenopathy. Does not bruise/bleed easily.  Psychiatric/Behavioral: Negative.   All other systems reviewed and are negative.   Objective:  BP 138/80 (BP Location: Left Arm, Patient Position: Sitting, Cuff Size: Normal)   Pulse 78   Temp 98.2 F (36.8 C) (Oral)   Resp 16   Ht 5\' 6"  (1.676 m)   Wt 155 lb 4 oz (70.4 kg)   SpO2 96%   BMI 25.06 kg/m   BP Readings from Last 3 Encounters:  01/17/18 138/80  01/13/18 (!) 160/82  01/10/18 (!) 161/90    Wt Readings from Last 3 Encounters:  01/17/18 155 lb 4 oz (70.4 kg)  01/13/18 155 lb (70.3 kg)  01/10/18 156 lb 14.4 oz (71.2 kg)    Physical Exam  Constitutional: She is oriented to person, place, and time. No distress.  HENT:  Mouth/Throat: Oropharynx is clear and moist. No oropharyngeal exudate.  Eyes: Conjunctivae are normal. No scleral icterus.  Neck: Normal range of motion. Neck supple. No JVD present. No thyromegaly present.    Cardiovascular: Normal rate, regular rhythm, normal heart sounds and intact distal pulses. Exam reveals no gallop and no friction rub.  No murmur heard. Pulmonary/Chest: Effort normal and breath sounds normal. No stridor. No respiratory distress. She has no wheezes. She has no rales.  Abdominal: Soft. Bowel sounds are normal. She exhibits no distension and no mass.  Musculoskeletal: Normal range of motion. She exhibits no edema, tenderness or deformity.  Lymphadenopathy:    She has  no cervical adenopathy.  Neurological: She is alert and oriented to person, place, and time.  Skin: Skin is warm and dry. No rash noted. She is not diaphoretic.  Vitals reviewed.   Lab Results  Component Value Date   WBC 4.1 12/31/2017   HGB 12.9 12/31/2017   HCT 39.1 12/31/2017   PLT 399 12/31/2017   GLUCOSE 103 12/31/2017   CHOL 200 03/11/2017   TRIG 93.0 03/11/2017   HDL 57.60 03/11/2017    LDLDIRECT 150.8 10/19/2011   LDLCALC 124 (H) 03/11/2017   ALT 15 12/31/2017   AST 14 12/31/2017   NA 140 12/31/2017   K 4.3 12/31/2017   CL 106 12/31/2017   CREATININE 0.81 12/31/2017   BUN 9 12/31/2017   CO2 26 12/31/2017   TSH 0.60 10/13/2016   HGBA1C 6.3 01/17/2018    Ct Chest W Contrast  Result Date: 01/10/2018 CLINICAL DATA:  Non-Hodgkin's lymphoma EXAM: CT CHEST WITH CONTRAST TECHNIQUE: Multidetector CT imaging of the chest was performed during intravenous contrast administration. CONTRAST:  45mL OMNIPAQUE IOHEXOL 300 MG/ML  SOLN COMPARISON:  Chest CT 01/01/2015 FINDINGS: Cardiovascular: No significant vascular findings. Normal heart size. No pericardial effusion. Mediastinum/Nodes: No axillary supraclavicular adenopathy. No mediastinal hilar adenopathy. No pericardial effusion. Esophagus normal. Lungs/Pleura: No suspicious pulmonary nodules.  Airways are normal. Upper Abdomen: RIGHT adrenal adenoma measures 18 mm in unchanged. Limited view of the liver, kidneys, pancreas are unremarkable. No aggressive osseous lesion. Musculoskeletal: No aggressive osseous lesion. Anterior cervical fusion IMPRESSION: 1. No evidence of lymphoma recurrence. 2. Benign RIGHT adrenal adenoma 3. Anterior cervical fusion. Electronically Signed   By: Suzy Bouchard M.D.   On: 01/10/2018 08:42    Assessment & Plan:   Lauren Orozco was seen today for diabetes, wound check and hypertension.  Diagnoses and all orders for this visit:  Abscess of skin of neck- This has resolved.  There is no evidence of remaining cystic structure or infection.  Type 2 diabetes mellitus with complication, without long-term current use of insulin ([place])- Her A1c is at 6.3%.  Her blood sugars are adequately well controlled. -     POCT glycosylated hemoglobin (Hb A1C) -     telmisartan (MICARDIS) 40 MG tablet; TAKE 1 TABLET(40 MG) BY MOUTH DAILY  Hyperlipidemia with target LDL less than 130  Essential hypertension - Her blood pressure  is well controlled.  Will continue the ARB.  Recent electrolytes and renal function were normal. -     telmisartan (MICARDIS) 40 MG tablet; TAKE 1 TABLET(40 MG) BY MOUTH DAILY  Hyperglycemia   I have discontinued Lauren Orozco. Lauren Orozco's cetirizine-pseudoephedrine and HYDROcodone-homatropine. I am also having her maintain her Vitamin D (Ergocalciferol), gabapentin, nicotine, and telmisartan.  Meds ordered this encounter  Medications  . telmisartan (MICARDIS) 40 MG tablet    Sig: TAKE 1 TABLET(40 MG) BY MOUTH DAILY    Dispense:  90 tablet    Refill:  0     Follow-up: Return in about 3 months (around 04/18/2018).  Scarlette Calico, MD

## 2018-01-27 ENCOUNTER — Other Ambulatory Visit: Payer: Self-pay | Admitting: Internal Medicine

## 2018-01-27 DIAGNOSIS — R739 Hyperglycemia, unspecified: Secondary | ICD-10-CM

## 2018-01-27 DIAGNOSIS — I10 Essential (primary) hypertension: Secondary | ICD-10-CM

## 2018-03-04 ENCOUNTER — Other Ambulatory Visit: Payer: Self-pay | Admitting: Family Medicine

## 2018-03-07 NOTE — Telephone Encounter (Signed)
Refill done.  

## 2018-03-15 ENCOUNTER — Ambulatory Visit: Payer: Medicare Other

## 2018-05-16 ENCOUNTER — Ambulatory Visit (INDEPENDENT_AMBULATORY_CARE_PROVIDER_SITE_OTHER)
Admission: RE | Admit: 2018-05-16 | Discharge: 2018-05-16 | Disposition: A | Discharge status: Home / Self Care | Payer: Medicare Other | Source: Ambulatory Visit | Attending: Internal Medicine | Admitting: Internal Medicine

## 2018-05-16 ENCOUNTER — Encounter: Payer: Self-pay | Admitting: Internal Medicine

## 2018-05-16 ENCOUNTER — Ambulatory Visit (INDEPENDENT_AMBULATORY_CARE_PROVIDER_SITE_OTHER): Payer: Medicare Other | Admitting: Internal Medicine

## 2018-05-16 VITALS — BP 130/82 | HR 84 | Temp 99.1°F | Ht 66.0 in | Wt 151.0 lb

## 2018-05-16 DIAGNOSIS — R6889 Other general symptoms and signs: Secondary | ICD-10-CM | POA: Diagnosis not present

## 2018-05-16 DIAGNOSIS — R059 Cough, unspecified: Secondary | ICD-10-CM

## 2018-05-16 DIAGNOSIS — J988 Other specified respiratory disorders: Secondary | ICD-10-CM

## 2018-05-16 DIAGNOSIS — R05 Cough: Secondary | ICD-10-CM | POA: Diagnosis not present

## 2018-05-16 LAB — POC INFLUENZA A&B (BINAX/QUICKVUE)
INFLUENZA B, POC: NEGATIVE
Influenza A, POC: NEGATIVE

## 2018-05-16 MED ORDER — HYDROCODONE-HOMATROPINE 5-1.5 MG/5ML PO SYRP: 5.0000 mL | ORAL_SOLUTION | Freq: Three times a day (TID) | ORAL | 0 refills | Status: AC | PRN

## 2018-05-16 MED ORDER — CEFDINIR 300 MG PO CAPS: 300.0000 mg | ORAL_CAPSULE | Freq: Two times a day (BID) | ORAL | 0 refills | Status: AC

## 2018-05-16 NOTE — Patient Instructions (Signed)
Cough, Adult  Coughing is a reflex that clears your throat and your airways. Coughing helps to heal and protect your lungs. It is normal to cough occasionally, but a cough that happens with other symptoms or lasts a long time may be a sign of a condition that needs treatment. A cough may last only 2-3 weeks (acute), or it may last longer than 8 weeks (chronic).  What are the causes?  Coughing is commonly caused by:   Breathing in substances that irritate your lungs.   A viral or bacterial respiratory infection.   Allergies.   Asthma.   Postnasal drip.   Smoking.   Acid backing up from the stomach into the esophagus (gastroesophageal reflux).   Certain medicines.   Chronic lung problems, including COPD (or rarely, lung cancer).   Other medical conditions such as heart failure.    Follow these instructions at home:  Pay attention to any changes in your symptoms. Take these actions to help with your discomfort:   Take medicines only as told by your health care provider.  ? If you were prescribed an antibiotic medicine, take it as told by your health care provider. Do not stop taking the antibiotic even if you start to feel better.  ? Talk with your health care provider before you take a cough suppressant medicine.   Drink enough fluid to keep your urine clear or pale yellow.   If the air is dry, use a cold steam vaporizer or humidifier in your bedroom or your home to help loosen secretions.   Avoid anything that causes you to cough at work or at home.   If your cough is worse at night, try sleeping in a semi-upright position.   Avoid cigarette smoke. If you smoke, quit smoking. If you need help quitting, ask your health care provider.   Avoid caffeine.   Avoid alcohol.   Rest as needed.    Contact a health care provider if:   You have new symptoms.   You cough up pus.   Your cough does not get better after 2-3 weeks, or your cough gets worse.   You cannot control your cough with suppressant  medicines and you are losing sleep.   You develop pain that is getting worse or pain that is not controlled with pain medicines.   You have a fever.   You have unexplained weight loss.   You have night sweats.  Get help right away if:   You cough up blood.   You have difficulty breathing.   Your heartbeat is very fast.  This information is not intended to replace advice given to you by your health care provider. Make sure you discuss any questions you have with your health care provider.  Document Released: 03/20/2011 Document Revised: 02/27/2016 Document Reviewed: 11/28/2014  Elsevier Interactive Patient Education  2018 [employer]

## 2018-05-16 NOTE — Progress Notes (Signed)
Subjective:  Patient ID: Lauren Orozco, female    DOB: [birth year]  Age: 62 y.o. MRN: [id number]  CC: Cough   HPI Lauren Orozco Lauren Orozco presents for a 3-day history of cough and runny nose both productive of thick brown phlegm.  She also complains of low-grade fever to 100.4 with hard shaking chills and fatigue.  Outpatient Medications Prior to Visit  Medication Sig Dispense Refill  . gabapentin (NEURONTIN) 100 MG capsule Take 2 capsules (200 mg total) by mouth at bedtime. 60 capsule 3  . nicotine (NICODERM CQ - DOSED IN MG/24 HR) 7 mg/24hr patch Place 1 patch (7 mg total) onto the skin daily. 28 patch 0  . telmisartan (MICARDIS) 40 MG tablet TAKE 1 TABLET(40 MG) BY MOUTH DAILY 90 tablet 0  . Vitamin D, Ergocalciferol, (DRISDOL) 50000 units CAPS capsule TAKE 1 CAPSULE BY MOUTH EVERY 7 DAYS 12 capsule 0  . telmisartan (MICARDIS) 40 MG tablet TAKE 1 TABLET(40 MG) BY MOUTH DAILY 90 tablet 1   No facility-administered medications prior to visit.     ROS Review of Systems  Constitutional: Positive for chills, fatigue and fever. Negative for diaphoresis.  HENT: Negative.  Negative for sore throat and trouble swallowing.   Eyes: Negative for visual disturbance.  Respiratory: Positive for cough. Negative for chest tightness, shortness of breath and wheezing.   Cardiovascular: Negative for chest pain and leg swelling.  Gastrointestinal: Negative for abdominal pain, constipation, diarrhea, nausea and vomiting.  Genitourinary: Negative.  Negative for difficulty urinating.  Musculoskeletal: Negative.  Negative for arthralgias and myalgias.  Skin: Negative.  Negative for color change.  Neurological: Negative.  Negative for dizziness, weakness, light-headedness and headaches.  Hematological: Negative for adenopathy. Does not bruise/bleed easily.  Psychiatric/Behavioral: Negative.     Objective:  BP 130/82 (BP Location: Left Arm, Patient Position: Sitting, Cuff Size: Large)    Pulse 84   Temp 99.1 F (37.3 C) (Oral)   Ht 5\' 6"  (1.676 m)   Wt 151 lb (68.5 kg)   SpO2 97%   BMI 24.37 kg/m   BP Readings from Last 3 Encounters:  05/16/18 130/82  01/17/18 138/80  01/13/18 (!) 160/82    Wt Readings from Last 3 Encounters:  05/16/18 151 lb (68.5 kg)  01/17/18 155 lb 4 oz (70.4 kg)  01/13/18 155 lb (70.3 kg)    Physical Exam  Constitutional: She is oriented to person, place, and time. No distress.  HENT:  Nose: Right sinus exhibits no maxillary sinus tenderness and no frontal sinus tenderness. Left sinus exhibits no maxillary sinus tenderness and no frontal sinus tenderness.  Mouth/Throat: Mucous membranes are normal. Mucous membranes are not pale, not dry and not cyanotic. No trismus in the jaw. Posterior oropharyngeal erythema present. No oropharyngeal exudate, posterior oropharyngeal edema or tonsillar abscesses.  Eyes: Conjunctivae are normal. No scleral icterus.  Neck: Normal range of motion. Neck supple. No JVD present. No thyromegaly present.  Cardiovascular: Normal rate, regular rhythm and normal heart sounds. Exam reveals no gallop and no friction rub.  No murmur heard. Pulmonary/Chest: Effort normal. No stridor. No respiratory distress. She has no decreased breath sounds. She has no wheezes. She has no rhonchi. She has rales in the right lower field.  Abdominal: Soft. Bowel sounds are normal. She exhibits no mass. There is no tenderness.  Lymphadenopathy:    She has no cervical adenopathy.  Neurological: She is alert and oriented to person, place, and time.  Skin: Skin is warm and dry. She is  not diaphoretic. No pallor.    Lab Results  Component Value Date   WBC 4.1 12/31/2017   HGB 12.9 12/31/2017   HCT 39.1 12/31/2017   PLT 399 12/31/2017   GLUCOSE 103 12/31/2017   CHOL 200 03/11/2017   TRIG 93.0 03/11/2017   HDL 57.60 03/11/2017   LDLDIRECT 150.8 10/19/2011   LDLCALC 124 (H) 03/11/2017   ALT 15 12/31/2017   AST 14 12/31/2017   NA 140  12/31/2017   K 4.3 12/31/2017   CL 106 12/31/2017   CREATININE 0.81 12/31/2017   BUN 9 12/31/2017   CO2 26 12/31/2017   TSH 0.60 10/13/2016   HGBA1C 6.3 01/17/2018    Ct Chest W Contrast  Result Date: 01/10/2018 CLINICAL DATA:  Non-Hodgkin's lymphoma EXAM: CT CHEST WITH CONTRAST TECHNIQUE: Multidetector CT imaging of the chest was performed during intravenous contrast administration. CONTRAST:  49mL OMNIPAQUE IOHEXOL 300 MG/ML  SOLN COMPARISON:  Chest CT 01/01/2015 FINDINGS: Cardiovascular: No significant vascular findings. Normal heart size. No pericardial effusion. Mediastinum/Nodes: No axillary supraclavicular adenopathy. No mediastinal hilar adenopathy. No pericardial effusion. Esophagus normal. Lungs/Pleura: No suspicious pulmonary nodules.  Airways are normal. Upper Abdomen: RIGHT adrenal adenoma measures 18 mm in unchanged. Limited view of the liver, kidneys, pancreas are unremarkable. No aggressive osseous lesion. Musculoskeletal: No aggressive osseous lesion. Anterior cervical fusion IMPRESSION: 1. No evidence of lymphoma recurrence. 2. Benign RIGHT adrenal adenoma 3. Anterior cervical fusion. Electronically Signed   By: Suzy Bouchard M.D.   On: 01/10/2018 08:42      Assessment & Plan:   Arleta was seen today for cough.  Diagnoses and all orders for this visit:  Cough- Her chest x-ray is negative for mass or infiltrate. -     Cancel: DG Chest 2 View; Future -     DG Chest 2 View; Future  Flu-like symptoms- Screening for influenza AMB is negative. -     POC Influenza A&B (Binax test)  RTI (respiratory tract infection) -     cefdinir (OMNICEF) 300 MG capsule; Take 1 capsule (300 mg total) by mouth 2 (two) times daily for 10 days. -     HYDROcodone-homatropine (HYCODAN) 5-1.5 MG/5ML syrup; Take 5 mLs by mouth every 8 (eight) hours as needed for up to 7 days for cough.   I am having Lauren Orozco. Lauren Orozco start on cefdinir and HYDROcodone-homatropine. I am also having her  maintain her gabapentin, nicotine, telmisartan, and Vitamin D (Ergocalciferol).  Meds ordered this encounter  Medications  . cefdinir (OMNICEF) 300 MG capsule    Sig: Take 1 capsule (300 mg total) by mouth 2 (two) times daily for 10 days.    Dispense:  20 capsule    Refill:  0  . HYDROcodone-homatropine (HYCODAN) 5-1.5 MG/5ML syrup    Sig: Take 5 mLs by mouth every 8 (eight) hours as needed for up to 7 days for cough.    Dispense:  120 mL    Refill:  0     Follow-up: Return in about 1 week (around 05/23/2018).  Scarlette Calico, MD

## 2018-05-18 ENCOUNTER — Encounter: Payer: Self-pay | Admitting: Internal Medicine

## 2018-05-30 ENCOUNTER — Ambulatory Visit: Payer: Self-pay | Admitting: Internal Medicine

## 2018-05-30 NOTE — Telephone Encounter (Signed)
Pt thinks she is having reaction to abx. Rash/itching all over.     Patient states she thinks she is having a reaction to the antibiotic she took- she is finished with the treatment. Patient states she finished the antibiotic on Thursday and she started itching on Friday. Patient has to go out of town for a funeral- so she did not get to follow up. Offered same day appointment - but due to transportation patient only wants to come to [place] office. Appointment given for in the morning. She was given precautions and care advice for the evening. Reason for Disposition . [1] MODERATE-SEVERE widespread itching (i.e., interferes with sleep, normal activities or school) AND [2] not improved after 24 hours of itching Care Advice  Answer Assessment - Initial Assessment Questions 1. DESCRIPTION: "Describe the itching you are having."     All over- body, leg,feet, arms,hands 2. SEVERITY: "How bad is it?"    - MILD - doesn't interfere with normal activities   - MODERATE - SEVERE: interferes with work, school, sleep, or other activities      severe 3. SCRATCHING: "Are there any scratch marks? Bleeding?"     Few- dry skin 4. ONSET: "When did this begin?"      Friday 5. CAUSE: "What do you think is causing the itching?" (ask about swimming pools, pollen, animals, soaps, etc.)     Possible reaction to Omnicef 6. OTHER SYMPTOMS: "Do you have any other symptoms?"      Itching all over- vaginal itching too 7. PREGNANCY: "Is there any chance you are pregnant?" "When was your last menstrual period?"     n/a  Protocols used: ITCHING [facility]

## 2018-05-31 ENCOUNTER — Ambulatory Visit: Payer: Medicare Other | Admitting: Family

## 2018-10-30 ENCOUNTER — Other Ambulatory Visit: Payer: Self-pay | Admitting: Internal Medicine

## 2018-10-30 DIAGNOSIS — I10 Essential (primary) hypertension: Secondary | ICD-10-CM

## 2018-10-30 DIAGNOSIS — E118 Type 2 diabetes mellitus with unspecified complications: Secondary | ICD-10-CM

## 2018-11-03 ENCOUNTER — Other Ambulatory Visit: Payer: Self-pay | Admitting: Internal Medicine

## 2018-11-03 DIAGNOSIS — E118 Type 2 diabetes mellitus with unspecified complications: Secondary | ICD-10-CM

## 2018-11-03 DIAGNOSIS — I10 Essential (primary) hypertension: Secondary | ICD-10-CM

## 2018-11-04 ENCOUNTER — Other Ambulatory Visit: Payer: Self-pay | Admitting: Internal Medicine

## 2018-11-04 DIAGNOSIS — I10 Essential (primary) hypertension: Secondary | ICD-10-CM

## 2018-11-04 DIAGNOSIS — E118 Type 2 diabetes mellitus with unspecified complications: Secondary | ICD-10-CM

## 2018-11-04 MED ORDER — TELMISARTAN 40 MG PO TABS: ORAL_TABLET | ORAL | 0 refills | Status: DC

## 2018-11-04 NOTE — Telephone Encounter (Signed)
Copied from [place] [id number]. Topic: Quick Communication - Rx Refill/Question >> Nov 04, 2018  2:23 PM [place], [place] D wrote: Medication: telmisartan (MICARDIS) 40 MG tablet / Pt called pharmacy for refill. Advised to schedule OV. Scheduled first available with Dr. Ronnald Ramp for 2.24.20. Pt would like to know if rx can be refilled until OV. Please advise pt. [phone]  Has the patient contacted their pharmacy? Yes.   (Agent: If no, request that the patient contact the pharmacy for the refill.) (Agent: If yes, when and what did the pharmacy advise?)  Preferred Pharmacy (with phone number or street name): [facility], [place] - [place] [phone] (Phone) [phone] (Fax)  Agent: Please be advised that RX refills may take up to 3 business days. We ask that you follow-up with your pharmacy.

## 2018-11-08 MED ORDER — TELMISARTAN 40 MG PO TABS: ORAL_TABLET | ORAL | 0 refills | Status: DC

## 2018-11-12 ENCOUNTER — Encounter: Payer: Self-pay | Admitting: Family Medicine

## 2018-11-12 ENCOUNTER — Other Ambulatory Visit (HOSPITAL_COMMUNITY)
Admit: 2018-11-12 | Discharge: 2018-11-12 | Disposition: A | Discharge status: Home / Self Care | Payer: Medicare Other | Source: Other Acute Inpatient Hospital | Attending: Pediatric Radiology | Admitting: Pediatric Radiology

## 2018-11-12 ENCOUNTER — Ambulatory Visit (INDEPENDENT_AMBULATORY_CARE_PROVIDER_SITE_OTHER): Payer: Medicare Other | Admitting: Family Medicine

## 2018-11-12 VITALS — BP 142/86 | HR 80 | Temp 98.0°F | Wt 158.0 lb

## 2018-11-12 DIAGNOSIS — R350 Frequency of micturition: Secondary | ICD-10-CM | POA: Insufficient documentation

## 2018-11-12 DIAGNOSIS — M25562 Pain in left knee: Secondary | ICD-10-CM | POA: Insufficient documentation

## 2018-11-12 DIAGNOSIS — E785 Hyperlipidemia, unspecified: Secondary | ICD-10-CM | POA: Diagnosis not present

## 2018-11-12 DIAGNOSIS — E119 Type 2 diabetes mellitus without complications: Secondary | ICD-10-CM | POA: Diagnosis not present

## 2018-11-12 DIAGNOSIS — G8929 Other chronic pain: Secondary | ICD-10-CM | POA: Diagnosis not present

## 2018-11-12 DIAGNOSIS — R1013 Epigastric pain: Secondary | ICD-10-CM | POA: Diagnosis not present

## 2018-11-12 DIAGNOSIS — M549 Dorsalgia, unspecified: Secondary | ICD-10-CM | POA: Diagnosis not present

## 2018-11-12 LAB — POC URINALSYSI DIPSTICK (AUTOMATED)
Bilirubin, UA: NEGATIVE
Blood, UA: NEGATIVE
Glucose, UA: NEGATIVE
KETONES UA: NEGATIVE
LEUKOCYTES UA: NEGATIVE
Nitrite, UA: NEGATIVE
PH UA: 6 (ref 5.0–8.0)
Protein, UA: NEGATIVE
Spec Grav, UA: 1.01 (ref 1.010–1.025)
Urobilinogen, UA: 0.2 E.U./dL

## 2018-11-12 LAB — POCT GLYCOSYLATED HEMOGLOBIN (HGB A1C): HEMOGLOBIN A1C: 6.2 % — AB (ref 4.0–5.6)

## 2018-11-12 LAB — GLUCOSE, POCT (MANUAL RESULT ENTRY): POC GLUCOSE: 115 mg/dL — AB (ref 70–99)

## 2018-11-12 MED ORDER — PREDNISONE 20 MG PO TABS: ORAL_TABLET | ORAL | 0 refills | Status: DC

## 2018-11-12 MED ORDER — IBUPROFEN 800 MG PO TABS: 800.0000 mg | ORAL_TABLET | Freq: Three times a day (TID) | ORAL | 0 refills | Status: DC | PRN

## 2018-11-12 NOTE — Patient Instructions (Signed)
Use ibuprofen with caution as this can cause stomach upset and can elevate blood pressure.   Prednisone as directed. I will check in with you once I get culture results and see how pain is doing.

## 2018-11-12 NOTE — Progress Notes (Signed)
Lauren Orozco DOB: 15-Dec-1955 Encounter date: 11/12/2018  This is a 63 y.o. female who presents with Chief Complaint  Patient presents with  . Back Pain    mid back radiates upper and lower back... x 1 week... describes the pain as stabbing... denies injury she is aware of...also radiating down left leg to knee.Marland KitchenMarland Kitchenpt has tried heat and is taking gabapentin, epsom salts....    History of present illness:  Having a hard time getting comfortable. Also pain in left knee; hurts to turn leg in. Having a lot of discomfort.   Pain has been there for a week. Did try to call for appointment but wasn't able to get in sooner.   Urinating more often. No dysuria.   Takes the gabapentin for hx of neck fusion. Tried this but didn't give her relief. Pain is mid-lower back; notes this on both sides. Rates pain as 8/10. Did rice sock, epson salt, drinking more water. Not getting relief. Hard to find comfortable position. Worse on back. Gets cramping sensation.   No blood in urine. Not had pain like this before.   Leg pain started first. Thought she had some fluid in knee. Then back started hurting.   No known trauma.     Allergies  Allergen Reactions  . Ciprofloxacin Hives    All floxacin drugs Burn-like hives  . Ofloxacin Hives    Burn like hives  . Other Other (See Comments)    All NUTS and corn due to diverticulitis   Current Meds  Medication Sig  . gabapentin (NEURONTIN) 100 MG capsule Take 2 capsules (200 mg total) by mouth at bedtime.  . nicotine (NICODERM CQ - DOSED IN MG/24 HR) 7 mg/24hr patch Place 1 patch (7 mg total) onto the skin daily.  Marland Kitchen telmisartan (MICARDIS) 40 MG tablet TAKE 1 TABLET(40 MG) BY MOUTH DAILY  . Vitamin D, Ergocalciferol, (DRISDOL) 50000 units CAPS capsule TAKE 1 CAPSULE BY MOUTH EVERY 7 DAYS  . [DISCONTINUED] telmisartan (MICARDIS) 40 MG tablet TAKE 1 TABLET(40 MG) BY MOUTH DAILY    Review of Systems  Constitutional: Negative for chills and fever.   Respiratory: Negative for cough and shortness of breath.   Cardiovascular: Negative for chest pain and leg swelling.  Gastrointestinal: Negative for abdominal pain.  Genitourinary: Positive for frequency. Negative for difficulty urinating, dysuria and hematuria.  Musculoskeletal: Positive for arthralgias, back pain and joint swelling (left knee).  Neurological: Negative for weakness and numbness.    Objective:  BP (!) 142/86   Pulse 80   Temp 98 F (36.7 C) (Oral)   Wt 158 lb (71.7 kg)   SpO2 98%   BMI 25.50 kg/m   Weight: 158 lb (71.7 kg)   BP Readings from Last 3 Encounters:  11/12/18 (!) 142/86  05/16/18 130/82  01/17/18 138/80   Wt Readings from Last 3 Encounters:  11/12/18 158 lb (71.7 kg)  05/16/18 151 lb (68.5 kg)  01/17/18 155 lb 4 oz (70.4 kg)    Physical Exam Vitals signs reviewed.  Constitutional:      General: Distressed: she is uncomfortable.  Cardiovascular:     Rate and Rhythm: Normal rate and regular rhythm.     Heart sounds: Normal heart sounds.  Pulmonary:     Effort: Pulmonary effort is normal.     Breath sounds: Normal breath sounds.  Abdominal:     General: Bowel sounds are normal. There is distension.     Palpations: Abdomen is soft.     Tenderness:  There is abdominal tenderness in the epigastric area. There is right CVA tenderness, left CVA tenderness and rebound.     Comments: No limited ROM of left knee. There is some medial effuse with tenderness to medial joint line and with medial pressure. There is minimal crepitance. No obvious laxity is appreciated. No erythema or warmth.   Musculoskeletal:     Comments: Back exam is difficult due to her discomfort. She has bilat CVA tenderness. There is tenderness thoracic to lumbar spine, some spasm para lumbar/thoracic. She has pain with extension. She cannot lateral bend or twist due to pain.   Neurological:     Mental Status: She is alert.     Deep Tendon Reflexes:     Reflex Scores:       Patellar reflexes are 1+ on the left side.      Achilles reflexes are 2+ on the right side.    Comments: Negative straight leg raise     Assessment/Plan Uncertain connection/etiology for both the back and knee pain she is having. Since sugar is well controlled; we are going to try a short prednisone burst. Urine has been sent for culture. I did order bloodwork for her to complete if not feeling better on Monday, but I did ask her to discuss with her primary before completing this. I was curious to get a CBC/CMP with current symptoms, but since she has not had some of baseline bloodwork in awhile I did go ahead and add this.   1. Acute bilateral back pain, unspecified back location UA appears negative for infection, but due to CVA tenderness and frequency will send for culture.  2. Urinary frequency - POC Urinalysis Dipstick  3. Type 2 diabetes mellitus without complication, without long-term current use of insulin (HCC) A1C checked today in office and was 6.2; glucose was 115.  - POC Glucose (CBG) - POC HgB A1c  4. Chronic pain of left knee This is intermittent, chronic but with acute discomfort. I am hopeful that a short course of prednisone will help w inflammation but if not she has appointment scheduled with her primary.   5. Epigastric pain- states that she always has some discomfort. Not painful unless palpated.  6. Hyperlipidemia - I have added a lipid screening to bloodwork.   Return with Dr. Ronnald Ramp as scheduled.    Micheline Rough, MD

## 2018-11-13 LAB — URINE CULTURE: Culture: NO GROWTH

## 2018-11-28 ENCOUNTER — Ambulatory Visit: Payer: Medicare Other | Admitting: Internal Medicine

## 2018-12-06 ENCOUNTER — Ambulatory Visit: Payer: Self-pay | Admitting: *Deleted

## 2018-12-06 ENCOUNTER — Encounter: Payer: Self-pay | Admitting: Family

## 2018-12-06 ENCOUNTER — Ambulatory Visit (INDEPENDENT_AMBULATORY_CARE_PROVIDER_SITE_OTHER): Payer: Medicare Other | Admitting: Family

## 2018-12-06 VITALS — BP 148/88 | HR 80 | Temp 98.3°F | Ht 66.0 in | Wt 159.2 lb

## 2018-12-06 DIAGNOSIS — M2550 Pain in unspecified joint: Secondary | ICD-10-CM | POA: Diagnosis not present

## 2018-12-06 DIAGNOSIS — M25522 Pain in left elbow: Secondary | ICD-10-CM

## 2018-12-06 NOTE — Progress Notes (Signed)
Lauren Orozco is a 63 y.o. female with the following history as recorded in [employer]:  Patient Active Problem List   Diagnosis Date Noted  . Flu-like symptoms 05/16/2018  . RTI (respiratory tract infection) 05/16/2018  . Abscess of skin of neck 01/13/2018  . Type II diabetes mellitus with manifestations ([place]) 10/13/2016  . Cough 08/26/2016  . Essential hypertension 01/02/2016  . Bilateral carpal tunnel syndrome 01/29/2015  . Low grade lymphoma, stage I ([place]) 12/25/2014  . Visit for screening mammogram 08/24/2014  . Constipation 10/03/2013  . Routine health maintenance 10/20/2011  . Tobacco abuse 10/19/2011  . Hyperlipidemia with target LDL less than 130 12/22/2010  . Generalized OA 12/22/2010  . DISC DISEASE, CERVICAL 12/22/2010    Current Outpatient Medications  Medication Sig Dispense Refill  . gabapentin (NEURONTIN) 100 MG capsule Take 2 capsules (200 mg total) by mouth at bedtime. 60 capsule 3  . ibuprofen (ADVIL,MOTRIN) 800 MG tablet Take 1 tablet (800 mg total) by mouth every 8 (eight) hours as needed for moderate pain. 30 tablet 0  . nicotine (NICODERM CQ - DOSED IN MG/24 HR) 7 mg/24hr patch Place 1 patch (7 mg total) onto the skin daily. 28 patch 0  . telmisartan (MICARDIS) 40 MG tablet TAKE 1 TABLET(40 MG) BY MOUTH DAILY 30 tablet 0  . Vitamin D, Ergocalciferol, (DRISDOL) 50000 units CAPS capsule TAKE 1 CAPSULE BY MOUTH EVERY 7 DAYS 12 capsule 0   No current facility-administered medications for this visit.     Allergies: Ciprofloxacin; Ofloxacin; and Other  Past Medical History:  Diagnosis Date  . Anxiety   . Chronic neck pain    with radiculopathy  . Chronic pain of left wrist 2009   "since neck surgery"  . Complication of anesthesia    Hard to wake up per pt; after neck sx had to be bagged.  Marland Kitchen DEPRESSION   . DISC DISEASE, CERVICAL   . Diverticulitis   . HYPERLIPIDEMIA   . Hypertension   . Lymphocytosis 01/02/2016  . NEPHROLITHIASIS, HX OF   . NHL  (non-Hodgkin's lymphoma) ([place]) 12/25/2014  . Recurrent abdominal pain   . Umbilical hernia   . UTI'S, CHRONIC     Past Surgical History:  Procedure Laterality Date  . ABDOMINAL HYSTERECTOMY  2001  . APPENDECTOMY  1981  . KIDNEY STONE SURGERY  2007  . LEFT OOPHORECTOMY  2001  . MYOMECTOMY  1981  . NECK SURGERY  2009   fusion of C1, C2, C3    Family History  Problem Relation Age of Onset  . Hypertension Mother   . Diabetes Mother   . Hyperlipidemia Mother   . Heart attack Father        MI  . Hyperlipidemia Father   . Hypertension Father   . Diabetes Father   . Heart disease Father   . Cancer Neg Hx        Negative for Breast or Colon Cancer  . Colon cancer Neg Hx     Social History   Tobacco Use  . Smoking status: Former Smoker    Packs/day: 0.25    Years: 30.00    Pack years: 7.50    Types: Cigarettes  . Smokeless tobacco: Never Used  . Tobacco comment: stopped around march   Substance Use Topics  . Alcohol use: No    Subjective:  Patient presents with concerns for left arm pain x 4 days; no known trauma or injury; feels a "catch" in her elbow; denies any  repetitive motion- does not do any heavy lifting; has used a Rice compress with some benefit;  Left knee pain x 1 month; Of note, was seen with similar symptoms in February- was given oral prednisone but did not feel it was beneficial. Was told to follow up with her PCP after that [place] but patient has not scheduled that appointment; Repeatedly asks for a referral back to her "doctor at [facility]";     Objective:  Vitals:   12/06/18 1401  BP: (!) 148/88  Pulse: 80  Temp: 98.3 F (36.8 C)  TempSrc: Oral  SpO2: 97%  Weight: 159 lb 3.2 oz (72.2 kg)  Height: 5' 6"  (1.676 m)    General: Well developed, well nourished, in no acute distress  Skin : Warm and dry.  Head: Normocephalic and atraumatic  Lungs: Respirations unlabored;  Musculoskeletal: No deformities; no active joint inflammation; FROM left knee/ no  swelling or tenderness noted on exam; mild swelling noted on left forearm- no warmth or redness; FROM left elbow; no swelling over bursa Extremities: No edema, cyanosis, clubbing  Vessels: Symmetric bilaterally  Neurologic: Alert and oriented; speech intact; face symmetrical; moves all extremities well; CNII-XII intact without focal deficit   Assessment:  1. Arthralgia, unspecified joint   2. Left elbow pain     Plan:  Very difficult historian- is agitated from the time she is seen because she has to leave by 2:30 and her appointment was scheduled at 1:40. Patient was not called back to be seen until 1:50.  Discussed need to get imaging of her knee and elbow and update labs- patient initially says she will come back tomorrow and then notes that she will "just wait and see her PCP" because this is the second visit here with these symptoms and no one is trying to help her. I again stressed the need to update labs and imaging.  She notes she cannot take NSAIDs, did not get any benefit with steroids- recommend to continue Tylenol.    No follow-ups on file.  Orders Placed This Encounter  Procedures  . DG Knee Complete 4 Views Left    Standing Status:   Future    Standing Expiration Date:   02/05/2020    Order Specific Question:   Reason for Exam (SYMPTOM  OR DIAGNOSIS REQUIRED)    Answer:   knee pain x 1 month    Order Specific Question:   Preferred imaging location?    Answer:   [facility]    Order Specific Question:   Radiology Contrast Protocol - do NOT remove file path    Answer:   \\[email or web address]  . DG Elbow Complete Left    Standing Status:   Future    Standing Expiration Date:   02/05/2020    Order Specific Question:   Reason for Exam (SYMPTOM  OR DIAGNOSIS REQUIRED)    Answer:   left elbow pain    Order Specific Question:   Preferred imaging location?    Answer:   [facility]    Order Specific Question:   Radiology Contrast  Protocol - do NOT remove file path    Answer:   \\[email or web address]  . Uric acid    Standing Status:   Future    Standing Expiration Date:   12/06/2019  . CBC w/Diff    Standing Status:   Future    Standing Expiration Date:   12/06/2019  . Comp Met (CMET)    Standing Status:   Future  Standing Expiration Date:   12/06/2019    Requested Prescriptions    No prescriptions requested or ordered in this encounter

## 2018-12-06 NOTE — Telephone Encounter (Signed)
Pt reports mild-moderate swelling left arm "Right under elbow" and 5/10 pain, onset Saturday. Also states swelling of left knee, inner aspect. Denies redness, warmth. States she was at MD appt with her sister and had that physician look at areas, recommended she see PCP. States she has applied ice and "Wrapped it", ineffective. Denies fever. Appt made for today with L. Valere Dross. Care advise given per protocol. Pt verbalizes understanding.   Reason for Disposition . [1] Small area of LOCALIZED swelling AND [2] not better after 3 days  Answer Assessment - Initial Assessment Questions 1. ONSET: "When did the swelling start?" (e.g., minutes, hours, days)     SAturday 2. LOCATION: "What part of the arm is swollen?"  "Are both arms swollen or just one arm?"     Left arm, under elbow 3. SEVERITY: "How bad is the swelling?" (e.g., localized; mild, moderate, severe)   - LOCALIZED: Small area of puffiness or swelling on just one arm   - JOINT SWELLING: Swelling of one joint   - MILD: Puffiness or swelling of hand   - MODERATE: Puffiness or swollen feeling of entire arm    - SEVERE: All of arm looks swollen; pitting edema     Mild-moderate 4. REDNESS: "Does the swelling look red or infected?"     no 5. PAIN: "Is the swelling painful to touch?" If so, ask: "How painful is it?"   (Scale 1-10; mild, moderate or severe)     5/10 6. FEVER: "Do you have a fever?" If so, ask: "What is it, how was it measured, and when did it start?"      no 7. CAUSE: "What do you think is causing the arm swelling?"     Not sure 8. MEDICAL HISTORY: "Do you have a history of heart failure, kidney disease, liver failure, or cancer?"     HTN 9. RECURRENT SYMPTOM: "Have you had arm swelling before?" If so, ask: "When was the last time?" "What happened that time?"    In knee, not arm 10. OTHER SYMPTOMS: "Do you have any other symptoms?" (e.g., chest pain, difficulty breathing)      Left knee swollen, inner area  Protocols  used: ARM SWELLING AND EDEMA-A-AH

## 2018-12-15 ENCOUNTER — Ambulatory Visit: Payer: Medicare Other | Admitting: Internal Medicine

## 2018-12-20 ENCOUNTER — Encounter: Payer: Self-pay | Admitting: Internal Medicine

## 2018-12-20 ENCOUNTER — Other Ambulatory Visit: Payer: Self-pay

## 2018-12-20 ENCOUNTER — Ambulatory Visit (INDEPENDENT_AMBULATORY_CARE_PROVIDER_SITE_OTHER)
Admission: RE | Admit: 2018-12-20 | Discharge: 2018-12-20 | Disposition: A | Discharge status: Home / Self Care | Payer: Medicare Other | Source: Ambulatory Visit | Attending: Internal Medicine | Admitting: Internal Medicine

## 2018-12-20 ENCOUNTER — Other Ambulatory Visit (INDEPENDENT_AMBULATORY_CARE_PROVIDER_SITE_OTHER): Payer: Medicare Other

## 2018-12-20 ENCOUNTER — Ambulatory Visit (INDEPENDENT_AMBULATORY_CARE_PROVIDER_SITE_OTHER): Payer: Medicare Other | Admitting: Internal Medicine

## 2018-12-20 VITALS — BP 178/92 | HR 91 | Temp 99.0°F | Resp 16 | Ht 66.0 in | Wt 160.5 lb

## 2018-12-20 DIAGNOSIS — E118 Type 2 diabetes mellitus with unspecified complications: Secondary | ICD-10-CM

## 2018-12-20 DIAGNOSIS — E785 Hyperlipidemia, unspecified: Secondary | ICD-10-CM | POA: Diagnosis not present

## 2018-12-20 DIAGNOSIS — B9789 Other viral agents as the cause of diseases classified elsewhere: Secondary | ICD-10-CM

## 2018-12-20 DIAGNOSIS — R05 Cough: Secondary | ICD-10-CM

## 2018-12-20 DIAGNOSIS — Z0000 Encounter for general adult medical examination without abnormal findings: Secondary | ICD-10-CM | POA: Diagnosis not present

## 2018-12-20 DIAGNOSIS — J069 Acute upper respiratory infection, unspecified: Secondary | ICD-10-CM

## 2018-12-20 DIAGNOSIS — R059 Cough, unspecified: Secondary | ICD-10-CM

## 2018-12-20 DIAGNOSIS — I10 Essential (primary) hypertension: Secondary | ICD-10-CM

## 2018-12-20 DIAGNOSIS — R079 Chest pain, unspecified: Secondary | ICD-10-CM | POA: Diagnosis not present

## 2018-12-20 DIAGNOSIS — Z1231 Encounter for screening mammogram for malignant neoplasm of breast: Secondary | ICD-10-CM

## 2018-12-20 DIAGNOSIS — E559 Vitamin D deficiency, unspecified: Secondary | ICD-10-CM | POA: Insufficient documentation

## 2018-12-20 LAB — URINALYSIS, ROUTINE W REFLEX MICROSCOPIC
Bilirubin Urine: NEGATIVE
Ketones, ur: NEGATIVE
Leukocytes,Ua: NEGATIVE
NITRITE: POSITIVE — AB
Specific Gravity, Urine: 1.025 (ref 1.000–1.030)
Total Protein, Urine: NEGATIVE
Urine Glucose: NEGATIVE
Urobilinogen, UA: 0.2 (ref 0.0–1.0)
pH: 6.5 (ref 5.0–8.0)

## 2018-12-20 LAB — CBC WITH DIFFERENTIAL/PLATELET
Basophils Absolute: 0.1 10*3/uL (ref 0.0–0.1)
Basophils Relative: 1.2 % (ref 0.0–3.0)
EOS PCT: 1.2 % (ref 0.0–5.0)
Eosinophils Absolute: 0.1 10*3/uL (ref 0.0–0.7)
HCT: 42.5 % (ref 36.0–46.0)
Hemoglobin: 14.3 g/dL (ref 12.0–15.0)
Lymphocytes Relative: 52.4 % — ABNORMAL HIGH (ref 12.0–46.0)
Lymphs Abs: 3.1 10*3/uL (ref 0.7–4.0)
MCHC: 33.6 g/dL (ref 30.0–36.0)
MCV: 90.2 fl (ref 78.0–100.0)
Monocytes Absolute: 0.6 10*3/uL (ref 0.1–1.0)
Monocytes Relative: 11.1 % (ref 3.0–12.0)
Neutro Abs: 2 10*3/uL (ref 1.4–7.7)
Neutrophils Relative %: 34.1 % — ABNORMAL LOW (ref 43.0–77.0)
Platelets: 407 10*3/uL — ABNORMAL HIGH (ref 150.0–400.0)
RBC: 4.71 Mil/uL (ref 3.87–5.11)
RDW: 13.8 % (ref 11.5–15.5)
WBC: 5.8 10*3/uL (ref 4.0–10.5)

## 2018-12-20 LAB — MICROALBUMIN / CREATININE URINE RATIO
Creatinine,U: 111.5 mg/dL
Microalb Creat Ratio: 0.8 mg/g (ref 0.0–30.0)
Microalb, Ur: 0.9 mg/dL (ref 0.0–1.9)

## 2018-12-20 LAB — LIPID PANEL
CHOLESTEROL: 263 mg/dL — AB (ref 0–200)
HDL: 65.8 mg/dL (ref >39.00)
LDL Cholesterol: 184 mg/dL — ABNORMAL HIGH (ref 0–99)
NonHDL: 196.93
Total CHOL/HDL Ratio: 4
Triglycerides: 67 mg/dL (ref 0.0–149.0)
VLDL: 13.4 mg/dL (ref 0.0–40.0)

## 2018-12-20 LAB — COMPREHENSIVE METABOLIC PANEL
ALT: 25 U/L (ref 0–35)
AST: 16 U/L (ref 0–37)
Albumin: 4.8 g/dL (ref 3.5–5.2)
Alkaline Phosphatase: 78 U/L (ref 39–117)
BUN: 11 mg/dL (ref 6–23)
CO2: 30 mEq/L (ref 19–32)
Calcium: 10.2 mg/dL (ref 8.4–10.5)
Chloride: 104 mEq/L (ref 96–112)
Creatinine, Ser: 0.77 mg/dL (ref 0.40–1.20)
GFR: 91.77 mL/min (ref >60.00)
GLUCOSE: 118 mg/dL — AB (ref 70–99)
Potassium: 4.5 mEq/L (ref 3.5–5.1)
Sodium: 140 mEq/L (ref 135–145)
Total Bilirubin: 0.5 mg/dL (ref 0.2–1.2)
Total Protein: 8 g/dL (ref 6.0–8.3)

## 2018-12-20 LAB — VITAMIN D 25 HYDROXY (VIT D DEFICIENCY, FRACTURES): VITD: 17.58 ng/mL — ABNORMAL LOW (ref 30.00–100.00)

## 2018-12-20 LAB — TSH: TSH: 0.77 u[IU]/mL (ref 0.35–4.50)

## 2018-12-20 MED ORDER — ROSUVASTATIN CALCIUM 20 MG PO TABS: 20.0000 mg | ORAL_TABLET | Freq: Every day | ORAL | 1 refills | Status: DC

## 2018-12-20 MED ORDER — AZILSARTAN-CHLORTHALIDONE 40-12.5 MG PO TABS: 1.0000 | ORAL_TABLET | Freq: Every day | ORAL | 0 refills | Status: DC

## 2018-12-20 MED ORDER — HYDROCODONE-HOMATROPINE 5-1.5 MG/5ML PO SYRP: 5.0000 mL | ORAL_SOLUTION | Freq: Three times a day (TID) | ORAL | 0 refills | Status: DC | PRN

## 2018-12-20 MED ORDER — VITAMIN D (ERGOCALCIFEROL) 1.25 MG (50000 UNIT) PO CAPS: ORAL_CAPSULE | ORAL | 0 refills | Status: DC

## 2018-12-20 NOTE — Patient Instructions (Signed)
Cough, Adult  Coughing is a reflex that clears your throat and your airways. Coughing helps to heal and protect your lungs. It is normal to cough occasionally, but a cough that happens with other symptoms or lasts a long time may be a sign of a condition that needs treatment. A cough may last only 2-3 weeks (acute), or it may last longer than 8 weeks (chronic). What are the causes? Coughing is commonly caused by:  Breathing in substances that irritate your lungs.  A viral or bacterial respiratory infection.  Allergies.  Asthma.  Postnasal drip.  Smoking.  Acid backing up from the stomach into the esophagus (gastroesophageal reflux).  Certain medicines.  Chronic lung problems, including COPD (or rarely, lung cancer).  Other medical conditions such as heart failure. Follow these instructions at home: Pay attention to any changes in your symptoms. Take these actions to help with your discomfort:  Take medicines only as told by your health care provider. ? If you were prescribed an antibiotic medicine, take it as told by your health care provider. Do not stop taking the antibiotic even if you start to feel better. ? Talk with your health care provider before you take a cough suppressant medicine.  Drink enough fluid to keep your urine clear or pale yellow.  If the air is dry, use a cold steam vaporizer or humidifier in your bedroom or your home to help loosen secretions.  Avoid anything that causes you to cough at work or at home.  If your cough is worse at night, try sleeping in a semi-upright position.  Avoid cigarette smoke. If you smoke, quit smoking. If you need help quitting, ask your health care provider.  Avoid caffeine.  Avoid alcohol.  Rest as needed. Contact a health care provider if:  You have new symptoms.  You cough up pus.  Your cough does not get better after 2-3 weeks, or your cough gets worse.  You cannot control your cough with suppressant  medicines and you are losing sleep.  You develop pain that is getting worse or pain that is not controlled with pain medicines.  You have a fever.  You have unexplained weight loss.  You have night sweats. Get help right away if:  You cough up blood.  You have difficulty breathing.  Your heartbeat is very fast. This information is not intended to replace advice given to you by your health care provider. Make sure you discuss any questions you have with your health care provider. Document Released: 03/20/2011 Document Revised: 02/27/2016 Document Reviewed: 11/28/2014 Elsevier Interactive Patient Education  2019 [employer]  

## 2018-12-20 NOTE — Progress Notes (Signed)
Subjective:  Patient ID: [name], female    DOB: 05-24-1956  Age: 63 y.o. MRN: [id number]  CC: Hypertension; Cough; Annual Exam; and Hyperlipidemia   HPI Lauren Orozco presents for a CPX.  She has not been taking an antihypertensive or monitoring her blood pressure.  She has had a few episodes of blurred vision but denies headache, CP, DOE, edema, or fatigue.  She complains of a 3-day history of nonproductive cough with night sweats and chills.  She denies fever.  She also complains of runny nose.  Past Medical History:  Diagnosis Date  . Anxiety   . Chronic neck pain    with radiculopathy  . Chronic pain of left wrist 2009   "since neck surgery"  . Complication of anesthesia    Hard to wake up per pt; after neck sx had to be bagged.  Marland Kitchen DEPRESSION   . DISC DISEASE, CERVICAL   . Diverticulitis   . HYPERLIPIDEMIA   . Hypertension   . Lymphocytosis 01/02/2016  . NEPHROLITHIASIS, HX OF   . NHL (non-Hodgkin's lymphoma) ([place]) 12/25/2014  . Recurrent abdominal pain   . Umbilical hernia   . UTI'S, CHRONIC    Past Surgical History:  Procedure Laterality Date  . ABDOMINAL HYSTERECTOMY  2001  . APPENDECTOMY  1981  . KIDNEY STONE SURGERY  2007  . LEFT OOPHORECTOMY  2001  . MYOMECTOMY  1981  . NECK SURGERY  2009   fusion of C1, C2, C3    reports that she has quit smoking. Her smoking use included cigarettes. She has a 7.50 pack-year smoking history. She has never used smokeless tobacco. She reports that she does not drink alcohol or use drugs. family history includes Diabetes in her father and mother; Heart attack in her father; Heart disease in her father; Hyperlipidemia in her father and mother; Hypertension in her father and mother. Allergies  Allergen Reactions  . Ciprofloxacin Hives    All floxacin drugs Burn-like hives  . Ofloxacin Hives    Burn like hives  . Other Other (See Comments)    All NUTS and corn due to diverticulitis    Outpatient  Medications Prior to Visit  Medication Sig Dispense Refill  . gabapentin (NEURONTIN) 100 MG capsule Take 2 capsules (200 mg total) by mouth at bedtime. 60 capsule 3  . ibuprofen (ADVIL,MOTRIN) 800 MG tablet Take 1 tablet (800 mg total) by mouth every 8 (eight) hours as needed for moderate pain. 30 tablet 0  . nicotine (NICODERM CQ - DOSED IN MG/24 HR) 7 mg/24hr patch Place 1 patch (7 mg total) onto the skin daily. 28 patch 0  . telmisartan (MICARDIS) 40 MG tablet TAKE 1 TABLET(40 MG) BY MOUTH DAILY 30 tablet 0  . Vitamin D, Ergocalciferol, (DRISDOL) 50000 units CAPS capsule TAKE 1 CAPSULE BY MOUTH EVERY 7 DAYS 12 capsule 0   No facility-administered medications prior to visit.     ROS Review of Systems  Constitutional: Positive for chills. Negative for diaphoresis, fatigue and fever.  HENT: Negative.  Negative for sinus pressure, sore throat, trouble swallowing and voice change.   Eyes: Positive for visual disturbance. Negative for photophobia and pain.  Respiratory: Positive for cough. Negative for chest tightness, shortness of breath and wheezing.   Cardiovascular: Negative for chest pain, palpitations and leg swelling.  Gastrointestinal: Negative for abdominal pain, constipation, diarrhea, nausea and vomiting.  Endocrine: Negative.   Genitourinary: Negative.  Negative for difficulty urinating.  Musculoskeletal: Negative.  Negative for  arthralgias and myalgias.  Skin: Negative.  Negative for color change, pallor and rash.  Neurological: Negative.  Negative for dizziness, weakness, light-headedness and headaches.  Hematological: Negative for adenopathy. Does not bruise/bleed easily.  Psychiatric/Behavioral: Negative.     Objective:  BP (!) 178/92 (BP Location: Left Arm, Patient Position: Sitting, Cuff Size: Normal)   Pulse 91   Temp 99 F (37.2 C) (Oral)   Resp 16   Ht 5\' 6"  (1.676 m)   Wt 160 lb 8 oz (72.8 kg)   SpO2 98%   BMI 25.91 kg/m   BP Readings from Last 3 Encounters:   12/20/18 (!) 178/92  12/06/18 (!) 148/88  11/12/18 (!) 142/86    Wt Readings from Last 3 Encounters:  12/20/18 160 lb 8 oz (72.8 kg)  12/06/18 159 lb 3.2 oz (72.2 kg)  11/12/18 158 lb (71.7 kg)    Physical Exam Vitals signs reviewed.  Constitutional:      Appearance: She is not ill-appearing or diaphoretic.  HENT:     Nose: Nose normal. No congestion or rhinorrhea.     Mouth/Throat:     Mouth: Mucous membranes are moist.     Pharynx: No oropharyngeal exudate or posterior oropharyngeal erythema.  Eyes:     General: No scleral icterus.    Conjunctiva/sclera: Conjunctivae normal.  Neck:     Musculoskeletal: Normal range of motion and neck supple. No muscular tenderness.  Cardiovascular:     Rate and Rhythm: Normal rate and regular rhythm.     Heart sounds: No murmur. No gallop.   Pulmonary:     Effort: Pulmonary effort is normal.     Breath sounds: No stridor. Examination of the left-lower field reveals rhonchi. Rhonchi present. No decreased breath sounds, wheezing or rales.  Abdominal:     General: Abdomen is flat.     Palpations: There is no hepatomegaly, splenomegaly or mass.     Tenderness: There is no abdominal tenderness. There is no guarding.  Genitourinary:    Comments: Breast, GU, rectal exams were deferred at her request. Musculoskeletal: Normal range of motion.        General: No swelling.     Right lower leg: No edema.     Left lower leg: No edema.  Lymphadenopathy:     Cervical: No cervical adenopathy.  Skin:    General: Skin is warm and dry.     Coloration: Skin is not pale.     Findings: No rash.  Neurological:     General: No focal deficit present.     Mental Status: She is oriented to person, place, and time.  Psychiatric:        Mood and Affect: Mood normal.        Behavior: Behavior normal.     Lab Results  Component Value Date   WBC 5.8 12/20/2018   HGB 14.3 12/20/2018   HCT 42.5 12/20/2018   PLT 407.0 (H) 12/20/2018   GLUCOSE 118 (H)  12/20/2018   CHOL 263 (H) 12/20/2018   TRIG 67.0 12/20/2018   HDL 65.80 12/20/2018   LDLDIRECT 150.8 10/19/2011   LDLCALC 184 (H) 12/20/2018   ALT 25 12/20/2018   AST 16 12/20/2018   NA 140 12/20/2018   K 4.5 12/20/2018   CL 104 12/20/2018   CREATININE 0.77 12/20/2018   BUN 11 12/20/2018   CO2 30 12/20/2018   TSH 0.77 12/20/2018   HGBA1C 6.2 (A) 11/12/2018   MICROALBUR 0.9 12/20/2018    Dg Chest 2  View  Result Date: 12/20/2018 CLINICAL DATA:  BILATERAL lateral chest pain for 2.5 weeks, hypertension, former smoker EXAM: CHEST - 2 VIEW COMPARISON:  05/16/2018 FINDINGS: Normal heart size, mediastinal contours, and pulmonary vascularity. Atherosclerotic calcification aorta. Lungs clear. No infiltrate, pleural effusion or pneumothorax. Prior cervical spine fusion. No acute osseous findings. IMPRESSION: No acute abnormalities. Electronically Signed   By: Lavonia Dana M.D.   On: 12/20/2018 12:07    Assessment & Plan:   Milca was seen today for hypertension, cough, annual exam and hyperlipidemia.  Diagnoses and all orders for this visit:  Essential hypertension- Her blood pressure is not adequately well controlled and she is symptomatic.  I have asked her to restart the ARB and to add a thiazide diuretic.  I will treat the vitamin D deficiency.  Otherwise, her labs are negative for secondary causes or endorgan damage. -     CBC with Differential/Platelet; Future -     TSH; Future -     VITAMIN D 25 Hydroxy (Vit-D Deficiency, Fractures); Future -     Urinalysis, Routine w reflex microscopic; Future -     Comprehensive metabolic panel; Future -     Azilsartan-Chlorthalidone (EDARBYCLOR) 40-12.5 MG TABS; Take 1 tablet by mouth daily.  Type II diabetes mellitus with manifestations ([place])- Her A1c is 6.2%.  Her blood sugars are adequately well controlled. -     Comprehensive metabolic panel; Future -     Microalbumin / creatinine urine ratio; Future -     HM Diabetes Foot Exam -      Ambulatory referral to Ophthalmology -     Azilsartan-Chlorthalidone (EDARBYCLOR) 40-12.5 MG TABS; Take 1 tablet by mouth daily.  Hyperlipidemia with target LDL less than 130- I have asked her to start taking a statin for CV risk reduction. -     Lipid panel; Future -     TSH; Future -     Comprehensive metabolic panel; Future -     rosuvastatin (CRESTOR) 20 MG tablet; Take 1 tablet (20 mg total) by mouth daily.  Cough- Her chest x-ray is negative for mass or infiltrate. -     DG Chest 2 View; Future  Visit for screening mammogram -     MM DIGITAL SCREENING BILATERAL; Future  Viral URI with cough -     HYDROcodone-homatropine (HYCODAN) 5-1.5 MG/5ML syrup; Take 5 mLs by mouth every 8 (eight) hours as needed for cough.  Vitamin D deficiency disease -     Vitamin D, Ergocalciferol, (DRISDOL) 1.25 MG (50000 UT) CAPS capsule; TAKE 1 CAPSULE BY MOUTH EVERY 7 DAYS   I have discontinued [name]. Laszlo's nicotine, telmisartan, and ibuprofen. I have also changed her Vitamin D (Ergocalciferol). Additionally, I am having her start on Azilsartan-Chlorthalidone, HYDROcodone-homatropine, and rosuvastatin. Lastly, I am having her maintain her gabapentin.  Meds ordered this encounter  Medications  . Azilsartan-Chlorthalidone (EDARBYCLOR) 40-12.5 MG TABS    Sig: Take 1 tablet by mouth daily.    Dispense:  49 tablet    Refill:  0  . HYDROcodone-homatropine (HYCODAN) 5-1.5 MG/5ML syrup    Sig: Take 5 mLs by mouth every 8 (eight) hours as needed for cough.    Dispense:  120 mL    Refill:  0  . rosuvastatin (CRESTOR) 20 MG tablet    Sig: Take 1 tablet (20 mg total) by mouth daily.    Dispense:  90 tablet    Refill:  1  . Vitamin D, Ergocalciferol, (DRISDOL) 1.25 MG (50000  UT) CAPS capsule    Sig: TAKE 1 CAPSULE BY MOUTH EVERY 7 DAYS    Dispense:  12 capsule    Refill:  0   See AVS for instructions about healthy living and anticipatory guidance.  Follow-up: Return in about 3 weeks (around  01/10/2019).  Lauren Calico, MD

## 2018-12-21 NOTE — Assessment & Plan Note (Signed)

## 2018-12-22 ENCOUNTER — Encounter (HOSPITAL_BASED_OUTPATIENT_CLINIC_OR_DEPARTMENT_OTHER): Payer: Self-pay | Admitting: *Deleted

## 2018-12-22 ENCOUNTER — Emergency Department (HOSPITAL_BASED_OUTPATIENT_CLINIC_OR_DEPARTMENT_OTHER)
Admission: EM | Admit: 2018-12-22 | Discharge: 2018-12-22 | Disposition: A | Discharge status: Home / Self Care | Payer: Medicare Other | Attending: Emergency Medicine | Admitting: Emergency Medicine

## 2018-12-22 ENCOUNTER — Ambulatory Visit: Payer: Self-pay

## 2018-12-22 ENCOUNTER — Other Ambulatory Visit: Payer: Self-pay

## 2018-12-22 ENCOUNTER — Ambulatory Visit: Payer: Medicare Other | Admitting: Internal Medicine

## 2018-12-22 DIAGNOSIS — R0789 Other chest pain: Secondary | ICD-10-CM | POA: Diagnosis not present

## 2018-12-22 DIAGNOSIS — Z87891 Personal history of nicotine dependence: Secondary | ICD-10-CM | POA: Insufficient documentation

## 2018-12-22 DIAGNOSIS — Z79899 Other long term (current) drug therapy: Secondary | ICD-10-CM | POA: Diagnosis not present

## 2018-12-22 DIAGNOSIS — I10 Essential (primary) hypertension: Secondary | ICD-10-CM | POA: Insufficient documentation

## 2018-12-22 NOTE — ED Provider Notes (Signed)
[facility] Provider Note   CSN: [id number] Arrival date & time: 12/22/18  1439    History   Chief Complaint Chief Complaint  Patient presents with  . Chest Pain  . Back Pain    HPI Florence Yeung is a 63 y.o. female.     Patient is a 63 year old female with a history of hypertension, hyperlipidemia, non-Hodgkin's lymphoma and chronic neck pain who presents with chest pain.  She states is been hurting since yesterday it hurts particularly to move.  If she makes a sudden movement it brings it on periods in her left lateral chest under her left arm and it radiates down her left arm.  She has some intermittent tingling in her left hand.  No weakness in the hand.  She also says comes from her neck and goes down her arm.  She denies any shortness of breath.  She is had some nausea but says that is a chronic issue for her.  She recently had an appointment with her PCP who started her back on an antihypertensive which is an ARB/diuretic.  She states she just started it yesterday and this morning her blood pressure was in the 90s.  She did not take her dose this morning.     Past Medical History:  Diagnosis Date  . Anxiety   . Chronic neck pain    with radiculopathy  . Chronic pain of left wrist 2009   "since neck surgery"  . Complication of anesthesia    Hard to wake up per pt; after neck sx had to be bagged.  Marland Kitchen DEPRESSION   . DISC DISEASE, CERVICAL   . Diverticulitis   . HYPERLIPIDEMIA   . Hypertension   . Lymphocytosis 01/02/2016  . NEPHROLITHIASIS, HX OF   . NHL (non-Hodgkin's lymphoma) ([place]) 12/25/2014  . Recurrent abdominal pain   . Umbilical hernia   . UTI'S, CHRONIC     Patient Active Problem List   Diagnosis Date Noted  . Viral URI with cough 12/20/2018  . Vitamin D deficiency disease 12/20/2018  . Type II diabetes mellitus with manifestations ([place]) 10/13/2016  . Cough 08/26/2016  . Essential hypertension 01/02/2016  .  Bilateral carpal tunnel syndrome 01/29/2015  . Low grade lymphoma, stage I ([place]) 12/25/2014  . Visit for screening mammogram 08/24/2014  . Constipation 10/03/2013  . Routine health maintenance 10/20/2011  . Tobacco abuse 10/19/2011  . Hyperlipidemia with target LDL less than 130 12/22/2010  . Generalized OA 12/22/2010  . [place] DISEASE, CERVICAL 12/22/2010    Past Surgical History:  Procedure Laterality Date  . ABDOMINAL HYSTERECTOMY  2001  . APPENDECTOMY  1981  . KIDNEY STONE SURGERY  2007  . LEFT OOPHORECTOMY  2001  . MYOMECTOMY  1981  . NECK SURGERY  2009   fusion of C1, C2, C3     OB History   No obstetric history on file.      Home Medications    Prior to Admission medications   Medication Sig Start Date End Date Taking? Authorizing Provider  Azilsartan-Chlorthalidone (EDARBYCLOR) 40-12.5 MG TABS Take 1 tablet by mouth daily. 12/20/18  Yes Janith Lima, MD  gabapentin (NEURONTIN) 100 MG capsule Take 2 capsules (200 mg total) by mouth at bedtime. 12/06/17  Yes Lyndal Pulley, DO  HYDROcodone-homatropine (HYCODAN) 5-1.5 MG/5ML syrup Take 5 mLs by mouth every 8 (eight) hours as needed for cough. 12/20/18  Yes Janith Lima, MD  rosuvastatin (CRESTOR) 20 MG tablet Take  1 tablet (20 mg total) by mouth daily. 12/20/18  Yes Janith Lima, MD  Vitamin D, Ergocalciferol, (DRISDOL) 1.25 MG (50000 UT) CAPS capsule TAKE 1 CAPSULE BY MOUTH EVERY 7 DAYS 12/20/18  Yes Janith Lima, MD    Family History Family History  Problem Relation Age of Onset  . Hypertension Mother   . Diabetes Mother   . Hyperlipidemia Mother   . Heart attack Father        MI  . Hyperlipidemia Father   . Hypertension Father   . Diabetes Father   . Heart disease Father   . Cancer Neg Hx        Negative for Breast or Colon Cancer  . Colon cancer Neg Hx     Social History Social History   Tobacco Use  . Smoking status: Former Smoker    Packs/day: 0.25    Years: 30.00    Pack years: 7.50     Types: Cigarettes  . Smokeless tobacco: Never Used  . Tobacco comment: stopped around march   Substance Use Topics  . Alcohol use: No  . Drug use: No     Allergies   Ciprofloxacin; Ofloxacin; and Other   Review of Systems Review of Systems  Constitutional: Negative for chills, diaphoresis, fatigue and fever.  HENT: Negative for congestion, rhinorrhea and sneezing.   Eyes: Negative.   Respiratory: Negative for cough, chest tightness and shortness of breath.   Cardiovascular: Positive for chest pain. Negative for leg swelling.  Gastrointestinal: Negative for abdominal pain, blood in stool, diarrhea, nausea and vomiting.  Genitourinary: Negative for difficulty urinating, flank pain, frequency and hematuria.  Musculoskeletal: Negative for arthralgias and back pain.  Skin: Negative for rash.  Neurological: Positive for numbness. Negative for dizziness, speech difficulty, weakness and headaches.     Physical Exam Updated Vital Signs BP 133/84   Pulse 78   Temp 98.3 F (36.8 C) (Oral)   Resp 14   Ht 5\' 6"  (1.676 m)   Wt 72.8 kg   SpO2 99%   BMI 25.90 kg/m   Physical Exam Constitutional:      Appearance: She is well-developed.  HENT:     Head: Normocephalic and atraumatic.  Eyes:     Pupils: Pupils are equal, round, and reactive to light.  Neck:     Musculoskeletal: Normal range of motion and neck supple.     Comments: Positive tenderness over the left trapezius muscle.  She has a soft tissue mass overlying her right clavicle which could be consistent with a cyst or lipoma. Cardiovascular:     Rate and Rhythm: Normal rate and regular rhythm.     Heart sounds: Normal heart sounds.  Pulmonary:     Effort: Pulmonary effort is normal. No respiratory distress.     Breath sounds: Normal breath sounds. No wheezing or rales.  Chest:     Chest wall: Tenderness (Positive reproducible tenderness across the left lateral chest wall, no crepitus or deformity) present.  Abdominal:      General: Bowel sounds are normal.     Palpations: Abdomen is soft.     Tenderness: There is no abdominal tenderness. There is no guarding or rebound.  Musculoskeletal: Normal range of motion.     Comments: No pain on range of motion of the left shoulder.  She has normal sensation and motor function to the left arm.  Radial pulses are intact.  Lymphadenopathy:     Cervical: No cervical adenopathy.  Skin:  General: Skin is warm and dry.     Findings: No rash.  Neurological:     Mental Status: She is alert and oriented to person, place, and time.      ED Treatments / Results  Labs (all labs ordered are listed, but only abnormal results are displayed) Labs Reviewed - No data to display  EKG EKG Interpretation  Date/Time:  Thursday December 22 2018 14:46:17 EDT Ventricular Rate:  87 PR Interval:  128 QRS Duration: 90 QT Interval:  360 QTC Calculation: 433 R Axis:   79 Text Interpretation:  Normal sinus rhythm with sinus arrhythmia Right atrial enlargement Borderline ECG SINCE LAST TRACING HEART RATE HAS INCREASED Confirmed by Malvin Johns [phone]) on 12/22/2018 3:18:18 PM   Radiology No results found.  Procedures Procedures (including critical care time)  Medications Ordered in ED Medications - No data to display   Initial Impression / Assessment and Plan / ED Course  I have reviewed the triage vital signs and the nursing notes.  Pertinent labs & imaging results that were available during my care of the patient were reviewed by me and considered in my medical decision making (see chart for details).        Patient is a 63 year old female who presents with chest pain.  It seems to be clearly reproducible and she also has associated tenderness over the left trapezius muscle.  It seems to be musculoskeletal in nature.  She does not have any associated symptoms which would be more concerning for ACS.  Her EKG is unchanged from her priors.  Her blood pressure was low  this morning but is back to normal today.  I did advise her to not take her blood pressure medicine today and retake it tomorrow but to keep an eye on her blood pressures and check them twice daily.  If it becomes lower than 100 to stop taking her medicine and call her PCP.  She is had a recent chest x-ray within the last 2 days that was normal so I did not feel that this need to be repeated today.  She was given strict return precautions.  Final Clinical Impressions(s) / ED Diagnoses   Final diagnoses:  Chest wall pain    ED Discharge Orders    None       Malvin Johns, MD 12/22/18 1529

## 2018-12-22 NOTE — Telephone Encounter (Signed)
Pt c/o pain under left breast that started last night that pt describes as steady ache. Pt co numbness and tingling and weakness to left arm that started last week BP 117/69. No radiation of chest pain to the back, shoulder or arm. Pt negative arm drift. No slurred speech , vision changes, SOB or sweating Pt c/o left temple headache when she leans to the right. Pt stated that she has a h/o neck issues and feels that the left arm tingling numbness and weakness are due to her neck issues. Care advice given and pt verbalized understanding. Pt given an appt to see provider in 4 hours instead of [facility]. Pt to see Dr Sharlet Salina at 3:00.   Reason for Disposition . [1] Weakness of the face, arm / hand, or leg / foot on one side of the body AND [2] gradual onset (e.g., days to weeks) AND [3] present now  Answer Assessment - Initial Assessment Questions 1. SYMPTOM: "What is the main symptom you are concerned about?" (e.g., weakness, numbness)     Chest pain under left breast, left arm tingling numbness weakness, low BP 2. ONSET: "When did this start?" (minutes, hours, days; while sleeping)    Pain under breast started last night- left side arm numbness and tingling last week  3. LAST NORMAL: "When was the last time you were normal (no symptoms)?"     Pt doesn't know 4. PATTERN "Does this come and go, or has it been constant since it started?"  "Is it present now?"     Constant this morning- yes 5. CARDIAC SYMPTOMS: "Have you had any of the following symptoms: chest pain, difficulty breathing, palpitations?"     Steady pain (ache) 6. NEUROLOGIC SYMPTOMS: "Have you had any of the following symptoms: headache, dizziness, vision loss, double vision, changes in speech, unsteady on your feet?"     When she leans to right she has an ache to the left temple 7. OTHER SYMPTOMS: "Do you have any other symptoms?"    Left hip pain 8. PREGNANCY: "Is there any chance you are pregnant?" "When was your last menstrual period?"     n/a  Protocols used: NEUROLOGIC DEFICIT-A-AH

## 2018-12-22 NOTE — Discharge Instructions (Signed)
Monitor your blood pressure twice daily.  If your blood pressure gets below 100, do not take your blood pressure medicine and contact your primary care doctor.  Return here as needed if you have any worsening symptoms.

## 2018-12-22 NOTE — ED Notes (Signed)
ED Provider at bedside. 

## 2018-12-22 NOTE — ED Triage Notes (Addendum)
Chest pain since last night with back pain. She was seen by her MD yesterday for same symptoms but also a cough. She had a negative CXR. Night sweats x 4 days.

## 2018-12-22 NOTE — Telephone Encounter (Signed)
I have called and talked with patient, numbness/tingling on left side is new----was just here 2 days ago with increased bp (007'H systolic) reports SOB upon exertion---bp is better today after taking bp medication prescribed by dr Ronnald Ramp 2 days ago---dry cough---no fever today--her brother died 2 weeks ago with massive heart attack---I have advised that patient go to [facility] ED to hopefully avoid long wait, last EKG was 2018, we need to make sure this is not heart related.  Patient advised to call our office back if she needs any further instructions, can talk with tamara,RN---already ruled out pneumonia with chest xray 2 days ago

## 2018-12-26 ENCOUNTER — Encounter: Payer: Self-pay | Admitting: Internal Medicine

## 2018-12-30 ENCOUNTER — Ambulatory Visit: Payer: Self-pay | Admitting: *Deleted

## 2018-12-30 NOTE — Telephone Encounter (Signed)
Patient called and left message for triage- Patient is concerned about her BP- it is 119/75 and 102/70.  Call to patient- she has not taken her medication in 2 days and she is feeling like she needs some direction on what to do.  Reason for Disposition . [2] Systolic BP 19-758 AND [8] taking blood pressure medications AND [3] dizzy, lightheaded or weak    Patient states she has had low BP readings and she has not taken her BP in 2 days now- patient reports she feel foggy headed. She is not sure what to do and needs advisement- call to PCP for appointment.  Answer Assessment - Initial Assessment Questions 1. BLOOD PRESSURE: "What is the blood pressure?" "Did you take at least two measurements 5 minutes apart?"     102/70- this morning 2. ONSET: "When did you take your blood pressure?"     This morning- 10 am 3. HOW: "How did you obtain the blood pressure?" (e.g., visiting nurse, automatic home BP monitor)     Manual cuff 4. HISTORY: "Do you have a history of low blood pressure?" "What is your blood pressure normally?"     Hx high BP 5. MEDICATIONS: "Are you taking any medications for blood pressure?" If yes: "Have they been changed recently?"     Patient has had recent change in BP medication- patient did not take medication yesterday- and is waiting for advisement today 6. PULSE RATE: "Do you know what your pulse rate is?"      P 77 this morning 7. OTHER SYMPTOMS: "Have you been sick recently?" "Have you had a recent injury?"     no 8. PREGNANCY: "Is there any chance you are pregnant?" "When was your last menstrual period?"     n/a  Protocols used: LOW BLOOD PRESSURE-A-AH

## 2018-12-30 NOTE — Telephone Encounter (Signed)
Pt would like to know what to do about her BP medication until Monday, should she continue not to take her medication or monitor it or what, please call back

## 2019-01-02 ENCOUNTER — Encounter: Payer: Self-pay | Admitting: Internal Medicine

## 2019-01-02 ENCOUNTER — Ambulatory Visit (INDEPENDENT_AMBULATORY_CARE_PROVIDER_SITE_OTHER): Payer: Medicare Other | Admitting: Internal Medicine

## 2019-01-02 ENCOUNTER — Telehealth: Payer: Self-pay

## 2019-01-02 VITALS — BP 141/86

## 2019-01-02 DIAGNOSIS — I10 Essential (primary) hypertension: Secondary | ICD-10-CM | POA: Diagnosis not present

## 2019-01-02 NOTE — Telephone Encounter (Signed)
-----   Message from Heath Lark, MD sent at 01/02/2019 11:08 AM EDT ----- Regarding: rescchedule to 3 month Pls call if she is ok reschedule appt to July

## 2019-01-02 NOTE — Telephone Encounter (Signed)
She called back and left a message that it okay to reschedule to July. Scheduling message sent.

## 2019-01-02 NOTE — Progress Notes (Signed)
Virtual Visit via Video Note  I connected with Lauren Orozco on 01/02/19 at  3:00 PM EDT by a video enabled telemedicine application and verified that I am speaking with the correct person using two identifiers.   I discussed the limitations of evaluation and management by telemedicine and the availability of in person appointments. The patient expressed understanding and agreed to proceed.  History of Present Illness: She asked for virtual meeting to discuss her blood pressure medicine and her statin.  About a week ago she was taking the combination of an ARB and thiazide diuretic and developed symptomatic hypotension with a systolic BP down to 96.  She has since stopped taking that combination and tells me her blood pressures today were 119/75 and 141/86.  She has quit smoking since I last saw her.  She also developed some muscle cramps from the statin so she stopped taking it.  She tells me the muscle cramps have resolved.  She walked today and at the onset of walking had a few episodes of lightheadedness but those symptoms resolved and she did not experience any CP, dizziness, lightheadedness, or DOE in the last mile of her walk.    Observations/Objective: During the exam she was walking around her apartment. She was alert and oriented and in no distress.  She tells me her blood pressure today was 141/86.   Assessment and Plan: She developed hypotension on the ARB thiazide diuretic combination.  Now that she has stopped taking those and has quit smoking and has started exercising her blood pressures have been adequately well controlled.  At this time I do not think she needs an antihypertensive.  In the midst of all this she also developed muscle cramps which may or may not have been related to the statin.   Follow Up Instructions: She was praised for smoking cessation.  For now she will not take an antihypertensive and she will continue to monitor her blood pressure.  She will give the  statin medication another try and if it caused muscle aches she will let me know and we will consider something else.  She will return in about 3 months for a blood pressure check.    I discussed the assessment and treatment plan with the patient. The patient was provided an opportunity to ask questions and all were answered. The patient agreed with the plan and demonstrated an understanding of the instructions.   The patient was advised to call back or seek an in-person evaluation if the symptoms worsen or if the condition fails to improve as anticipated.  I provided 20 minutes of non-face-to-face time during this encounter.   Scarlette Calico, MD

## 2019-01-02 NOTE — Progress Notes (Deleted)
   Subjective:  Patient ID: Lauren Orozco, female    DOB: 03-13-1956  Age: 63 y.o. MRN: [id number]  CC: No chief complaint on file.   HPI Tabia Landowski Kohler presents for ***  Outpatient Medications Prior to Visit  Medication Sig Dispense Refill  . Azilsartan-Chlorthalidone (EDARBYCLOR) 40-12.5 MG TABS Take 1 tablet by mouth daily. 49 tablet 0  . gabapentin (NEURONTIN) 100 MG capsule Take 2 capsules (200 mg total) by mouth at bedtime. 60 capsule 3  . HYDROcodone-homatropine (HYCODAN) 5-1.5 MG/5ML syrup Take 5 mLs by mouth every 8 (eight) hours as needed for cough. 120 mL 0  . rosuvastatin (CRESTOR) 20 MG tablet Take 1 tablet (20 mg total) by mouth daily. 90 tablet 1  . Vitamin D, Ergocalciferol, (DRISDOL) 1.25 MG (50000 UT) CAPS capsule TAKE 1 CAPSULE BY MOUTH EVERY 7 DAYS 12 capsule 0   No facility-administered medications prior to visit.     ROS Review of Systems  Objective:  There were no vitals taken for this visit.  BP Readings from Last 3 Encounters:  12/22/18 128/76  12/20/18 (!) 178/92  12/06/18 (!) 148/88    Wt Readings from Last 3 Encounters:  12/22/18 160 lb 7.9 oz (72.8 kg)  12/20/18 160 lb 8 oz (72.8 kg)  12/06/18 159 lb 3.2 oz (72.2 kg)    Physical Exam  Lab Results  Component Value Date   WBC 5.8 12/20/2018   HGB 14.3 12/20/2018   HCT 42.5 12/20/2018   PLT 407.0 (H) 12/20/2018   GLUCOSE 118 (H) 12/20/2018   CHOL 263 (H) 12/20/2018   TRIG 67.0 12/20/2018   HDL 65.80 12/20/2018   LDLDIRECT 150.8 10/19/2011   LDLCALC 184 (H) 12/20/2018   ALT 25 12/20/2018   AST 16 12/20/2018   NA 140 12/20/2018   K 4.5 12/20/2018   CL 104 12/20/2018   CREATININE 0.77 12/20/2018   BUN 11 12/20/2018   CO2 30 12/20/2018   TSH 0.77 12/20/2018   HGBA1C 6.2 (A) 11/12/2018   MICROALBUR 0.9 12/20/2018    No results found.  Assessment & Plan:   There are no diagnoses linked to this encounter. I am having Reeva Davern. Bugbee maintain her gabapentin,  Azilsartan-Chlorthalidone, HYDROcodone-homatropine, rosuvastatin, and Vitamin D (Ergocalciferol).  No orders of the defined types were placed in this encounter.    Follow-up: No follow-ups on file.  Scarlette Calico, MD

## 2019-01-02 NOTE — Telephone Encounter (Signed)
Called and left a message asking her to call the office back. 

## 2019-01-02 NOTE — Telephone Encounter (Signed)
appt set up

## 2019-01-02 NOTE — Telephone Encounter (Signed)
Can we set her up with an evisit please?

## 2019-01-03 ENCOUNTER — Telehealth: Payer: Self-pay | Admitting: Hematology and Oncology

## 2019-01-03 NOTE — Telephone Encounter (Signed)
R/s appt per 3/30 sch message - unable to reach patient - left message for patient with appt date and time

## 2019-01-10 ENCOUNTER — Ambulatory Visit: Payer: Medicare Other | Admitting: Hematology and Oncology

## 2019-01-10 ENCOUNTER — Other Ambulatory Visit: Payer: Medicare Other

## 2019-01-12 ENCOUNTER — Other Ambulatory Visit: Payer: Self-pay

## 2019-01-12 NOTE — Patient Outreach (Signed)
[facility]) Care Management  01/12/2019  Jaqlyn [facility] June 14, 1956 [id number]    Medication Adherence call to Mrs. Philippa Chester spoke with patient she is past due on Telmisartan 40 mg patient explain doctor took her off this medication. Mrs. Biancardi is showing past due under [place].   [facility] Management Direct Dial [phone]  Fax [phone] [email or web address]

## 2019-02-16 ENCOUNTER — Other Ambulatory Visit: Payer: Self-pay | Admitting: Internal Medicine

## 2019-02-16 ENCOUNTER — Encounter: Payer: Self-pay | Admitting: Internal Medicine

## 2019-02-16 DIAGNOSIS — E559 Vitamin D deficiency, unspecified: Secondary | ICD-10-CM

## 2019-02-16 DIAGNOSIS — J069 Acute upper respiratory infection, unspecified: Secondary | ICD-10-CM

## 2019-02-17 ENCOUNTER — Ambulatory Visit: Payer: Self-pay | Admitting: Internal Medicine

## 2019-02-17 NOTE — Telephone Encounter (Signed)
Pt. Reports she has shortness of breath with exertion and long conversations, this week. Hurts at times when she takes a deep breath. Denies cough or fever. States she feels like when she has had bronchitis in the past. Sleeps well, no shortness of breat at night. Pt. Scheduled for virtual visit for tomorrow.Instructed if symptoms worsen to go to ED. Verbalizes understanding.  Reason for Disposition . [1] MODERATE longstanding difficulty breathing (e.g., speaks in phrases, SOB even at rest, pulse 100-120) AND [2] SAME as normal  Answer Assessment - Initial Assessment Questions 1. RESPIRATORY STATUS: "Describe your breathing?" (e.g., wheezing, shortness of breath, unable to speak, severe coughing)      Shortness of breath with walking and takling 2. ONSET: "When did this breathing problem begin?"      This week 3. PATTERN "Does the difficult breathing come and go, or has it been constant since it started?"      Constant 4. SEVERITY: "How bad is your breathing?" (e.g., mild, moderate, severe)    - MILD: No SOB at rest, mild SOB with walking, speaks normally in sentences, can lay down, no retractions, pulse < 100.    - MODERATE: SOB at rest, SOB with minimal exertion and prefers to sit, cannot lie down flat, speaks in phrases, mild retractions, audible wheezing, pulse 100-120.    - SEVERE: Very SOB at rest, speaks in single words, struggling to breathe, sitting hunched forward, retractions, pulse > 120      Mild 5. RECURRENT SYMPTOM: "Have you had difficulty breathing before?" If so, ask: "When was the last time?" and "What happened that time?"      Bronchitis 6. CARDIAC HISTORY: "Do you have any history of heart disease?" (e.g., heart attack, angina, bypass surgery, angioplasty)      No 7. LUNG HISTORY: "Do you have any history of lung disease?"  (e.g., pulmonary embolus, asthma, emphysema)     No 8. CAUSE: "What do you think is causing the breathing problem?"      Unsure 9. OTHER SYMPTOMS:  "Do you have any other symptoms? (e.g., dizziness, runny nose, cough, chest pain, fever)     No 10. PREGNANCY: "Is there any chance you are pregnant?" "When was your last menstrual period?"       No 11. TRAVEL: "Have you traveled out of the country in the last month?" (e.g., travel history, exposures)       No  Protocols used: BREATHING DIFFICULTY-A-AH

## 2019-02-18 ENCOUNTER — Ambulatory Visit: Payer: Medicare Other | Admitting: Family Medicine

## 2019-02-18 ENCOUNTER — Other Ambulatory Visit: Payer: Self-pay

## 2019-02-18 ENCOUNTER — Encounter: Payer: Self-pay | Admitting: Family Medicine

## 2019-02-18 DIAGNOSIS — R0602 Shortness of breath: Secondary | ICD-10-CM

## 2019-02-18 NOTE — Progress Notes (Addendum)
Virtual Visit via Video Note  I connected with Lauren Arabian. Orozco on 02/18/19 at  9:00 AM EDT by a video enabled telemedicine application and verified that I am speaking with the correct person using two identifiers.  Location patient: home Location provider:work or home office Persons participating in the virtual visit: patient, provider  I discussed the limitations of evaluation and management by telemedicine and the availability of in person appointments. The patient expressed understanding and agreed to proceed.   HPI: Pt endorses SOB, soreness in chest x 1 wk.  Tried cough syrup.  SOB increased yesterday while sitting on porch.  Pt expresses frustration, states she does not understand why she was made an e-visit.  Pt states she wants to be seen in person and there is nothing this provider can do to help her on video.  Attempts to calm pt were unsuccessful.  Pt states " [place] has gone down hill", "I can't ever see my doctor", "I see how they are playing with black women's lives on tv", "all y'all want to do is charge my insurance".  Pt states she will wait on her daughter to get off work and take her to the hospital.   Attempts to explain how a pt could be assess on e-visit were made, however pt declined.  Pt advised if she felt her symptoms were worse she could call 911.  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   ROS: See pertinent positives and negatives per HPI.  Past Medical History:  Diagnosis Date  . Anxiety   . Chronic neck pain    with radiculopathy  . Chronic pain of left wrist 2009   "since neck surgery"  . Complication of anesthesia    Hard to wake up per pt; after neck sx had to be bagged.  Mar[facility] Kitchen DEPRESSION   . DISC DISEASE, CERVICAL   . Diverticulitis   . HYPERLIPIDEMIA   . Hypertension   . Lymphocytosis 01/02/2016  . NEPHROLITHIASIS, HX OF   . NHL (non-Hodgkin's lymphoma) ([place]) 12/25/2014  . Recurrent abdominal pain   . Umbilical  hernia   . UTI'S, CHRONIC     Past Surgical History:  Procedure Laterality Date  . ABDOMINAL HYSTERECTOMY  2001  . APPENDECTOMY  1981  . KIDNEY STONE SURGERY  2007  . LEFT OOPHORECTOMY  2001  . MYOMECTOMY  1981  . NECK SURGERY  2009   fusion of C1, C2, C3    Family History  Problem Relation Age of Onset  . Hypertension Mother   . Diabetes Mother   . Hyperlipidemia Mother   . Heart attack Father        MI  . Hyperlipidemia Father   . Hypertension Father   . Diabetes Father   . Heart disease Father   . Cancer Neg Hx        Negative for Breast or Colon Cancer  . Colon cancer Neg Hx     SOCIAL HX:   Pt mentions she "lost 3 family members over the last 2 wks".   Current Outpatient Medications:  .  gabapentin (NEURONTIN) 100 MG capsule, Take 2 capsules (200 mg total) by mouth at bedtime., Disp: 60 capsule, Rfl: 3 .  HYDROcodone-homatropine (HYCODAN) 5-1.5 MG/5ML syrup, Take 5 mLs by mouth every 8 (eight) hours as needed for cough., Disp: 120 mL, Rfl: 0 .  rosuvastatin (CRESTOR) 20 MG tablet, Take 1 tablet (20 mg total) by mouth daily.,  Disp: 90 tablet, Rfl: 1 .  Vitamin D, Ergocalciferol, (DRISDOL) 1.25 MG (50000 UT) CAPS capsule, TAKE 1 CAPSULE BY MOUTH EVERY 7 DAYS, Disp: 12 capsule, Rfl: 0  EXAM:  VITALS per patient if applicable:  GENERAL: alert, oriented, appears well and in no acute distress  HEENT: atraumatic, conjunctiva clear, no obvious abnormalities on inspection of external nose and ears  NECK: normal movements of the head and neck  LUNGS: Not coughing.  On inspection no signs of respiratory distress, breathing rate appears normal, no obvious gross SOB, gasping or wheezing  CV: no obvious cyanosis  MS: moves all visible extremities without noticeable abnormality  PSYCH/NEURO: no obvious depression or anxiety, speech and thought processing grossly intact  ASSESSMENT AND PLAN:  Discussed the following assessment and plan:  SOB (shortness of  breath) -stable at this time.   -unable to fully assess pt 2/2 pt cooperation -Pt plans to proceed to the nearest ED for further eval.  F/u with pcp Monday   I discussed the assessment and treatment plan with the patient. The patient was provided an opportunity to ask questions and all were answered. The patient agreed with the plan and demonstrated an understanding of the instructions.   The patient was advised to call back or seek an in-person evaluation if the symptoms worsen or if the condition fails to improve as anticipated.  Billie Ruddy, MD

## 2019-03-07 ENCOUNTER — Inpatient Hospital Stay: Admission: RE | Admit: 2019-03-07 | Payer: Medicare Other | Source: Ambulatory Visit

## 2019-04-05 ENCOUNTER — Other Ambulatory Visit (INDEPENDENT_AMBULATORY_CARE_PROVIDER_SITE_OTHER): Payer: Medicare Other

## 2019-04-05 ENCOUNTER — Encounter: Payer: Self-pay | Admitting: Internal Medicine

## 2019-04-05 ENCOUNTER — Other Ambulatory Visit: Payer: Self-pay

## 2019-04-05 ENCOUNTER — Ambulatory Visit (INDEPENDENT_AMBULATORY_CARE_PROVIDER_SITE_OTHER)
Admission: RE | Admit: 2019-04-05 | Discharge: 2019-04-05 | Disposition: A | Discharge status: Home / Self Care | Payer: Medicare Other | Source: Ambulatory Visit | Attending: Internal Medicine | Admitting: Internal Medicine

## 2019-04-05 ENCOUNTER — Ambulatory Visit (INDEPENDENT_AMBULATORY_CARE_PROVIDER_SITE_OTHER): Payer: Medicare Other | Admitting: Internal Medicine

## 2019-04-05 VITALS — BP 166/94 | HR 72 | Temp 98.6°F | Resp 16 | Ht 66.0 in | Wt 160.0 lb

## 2019-04-05 DIAGNOSIS — R319 Hematuria, unspecified: Secondary | ICD-10-CM

## 2019-04-05 DIAGNOSIS — N200 Calculus of kidney: Secondary | ICD-10-CM | POA: Diagnosis not present

## 2019-04-05 DIAGNOSIS — R109 Unspecified abdominal pain: Secondary | ICD-10-CM | POA: Diagnosis not present

## 2019-04-05 DIAGNOSIS — E785 Hyperlipidemia, unspecified: Secondary | ICD-10-CM | POA: Diagnosis not present

## 2019-04-05 DIAGNOSIS — E559 Vitamin D deficiency, unspecified: Secondary | ICD-10-CM

## 2019-04-05 DIAGNOSIS — I10 Essential (primary) hypertension: Secondary | ICD-10-CM

## 2019-04-05 DIAGNOSIS — E118 Type 2 diabetes mellitus with unspecified complications: Secondary | ICD-10-CM

## 2019-04-05 DIAGNOSIS — D75839 Thrombocytosis, unspecified: Secondary | ICD-10-CM

## 2019-04-05 DIAGNOSIS — N281 Cyst of kidney, acquired: Secondary | ICD-10-CM | POA: Diagnosis not present

## 2019-04-05 DIAGNOSIS — D3501 Benign neoplasm of right adrenal gland: Secondary | ICD-10-CM | POA: Diagnosis not present

## 2019-04-05 DIAGNOSIS — D473 Essential (hemorrhagic) thrombocythemia: Secondary | ICD-10-CM

## 2019-04-05 LAB — POC URINALSYSI DIPSTICK (AUTOMATED)
Bilirubin, UA: NEGATIVE
Blood, UA: POSITIVE
Glucose, UA: NEGATIVE
Ketones, UA: NEGATIVE
Leukocytes, UA: NEGATIVE
Nitrite, UA: NEGATIVE
Protein, UA: NEGATIVE
Spec Grav, UA: 1.025 (ref 1.010–1.025)
Urobilinogen, UA: 0.2 E.U./dL
pH, UA: 6 (ref 5.0–8.0)

## 2019-04-05 LAB — BASIC METABOLIC PANEL
BUN: 11 mg/dL (ref 6–23)
CO2: 28 mEq/L (ref 19–32)
Calcium: 9.5 mg/dL (ref 8.4–10.5)
Chloride: 106 mEq/L (ref 96–112)
Creatinine, Ser: 0.79 mg/dL (ref 0.40–1.20)
GFR: 89.01 mL/min (ref >60.00)
Glucose, Bld: 108 mg/dL — ABNORMAL HIGH (ref 70–99)
Potassium: 4.2 mEq/L (ref 3.5–5.1)
Sodium: 142 mEq/L (ref 135–145)

## 2019-04-05 LAB — LIPID PANEL
Cholesterol: 239 mg/dL — ABNORMAL HIGH (ref 0–200)
HDL: 55.5 mg/dL (ref >39.00)
LDL Cholesterol: 163 mg/dL — ABNORMAL HIGH (ref 0–99)
NonHDL: 183.58
Total CHOL/HDL Ratio: 4
Triglycerides: 105 mg/dL (ref 0.0–149.0)
VLDL: 21 mg/dL (ref 0.0–40.0)

## 2019-04-05 LAB — CBC WITH DIFFERENTIAL/PLATELET
Basophils Absolute: 0.1 10*3/uL (ref 0.0–0.1)
Basophils Relative: 0.9 % (ref 0.0–3.0)
Eosinophils Absolute: 0.1 10*3/uL (ref 0.0–0.7)
Eosinophils Relative: 1.6 % (ref 0.0–5.0)
HCT: 40 % (ref 36.0–46.0)
Hemoglobin: 13.5 g/dL (ref 12.0–15.0)
Lymphocytes Relative: 55.8 % — ABNORMAL HIGH (ref 12.0–46.0)
Lymphs Abs: 3 10*3/uL (ref 0.7–4.0)
MCHC: 33.7 g/dL (ref 30.0–36.0)
MCV: 91.5 fl (ref 78.0–100.0)
Monocytes Absolute: 0.6 10*3/uL (ref 0.1–1.0)
Monocytes Relative: 11.7 % (ref 3.0–12.0)
Neutro Abs: 1.6 10*3/uL (ref 1.4–7.7)
Neutrophils Relative %: 30 % — ABNORMAL LOW (ref 43.0–77.0)
Platelets: 380 10*3/uL (ref 150.0–400.0)
RBC: 4.37 Mil/uL (ref 3.87–5.11)
RDW: 14.3 % (ref 11.5–15.5)
WBC: 5.4 10*3/uL (ref 4.0–10.5)

## 2019-04-05 LAB — MAGNESIUM: Magnesium: 2 mg/dL (ref 1.5–2.5)

## 2019-04-05 LAB — IBC PANEL
Iron: 116 ug/dL (ref 42–145)
Saturation Ratios: 32.2 % (ref 20.0–50.0)
Transferrin: 257 mg/dL (ref 212.0–360.0)

## 2019-04-05 LAB — CK: Total CK: 110 U/L (ref 7–177)

## 2019-04-05 LAB — VITAMIN D 25 HYDROXY (VIT D DEFICIENCY, FRACTURES): VITD: 21.41 ng/mL — ABNORMAL LOW (ref 30.00–100.00)

## 2019-04-05 LAB — HEMOGLOBIN A1C: Hgb A1c MFr Bld: 6.5 % (ref 4.6–6.5)

## 2019-04-05 MED ORDER — CHOLECALCIFEROL 50 MCG (2000 UT) PO TABS: 2.0000 | ORAL_TABLET | Freq: Every day | ORAL | 1 refills | Status: DC

## 2019-04-05 MED ORDER — OXYCODONE HCL 5 MG PO TABS: 5.0000 mg | ORAL_TABLET | ORAL | 0 refills | Status: DC | PRN

## 2019-04-05 MED ORDER — ROSUVASTATIN CALCIUM 20 MG PO TABS: 20.0000 mg | ORAL_TABLET | Freq: Every day | ORAL | 1 refills | Status: DC

## 2019-04-05 MED ORDER — INDAPAMIDE 1.25 MG PO TABS: 1.2500 mg | ORAL_TABLET | Freq: Every day | ORAL | 0 refills | Status: DC

## 2019-04-05 NOTE — Progress Notes (Signed)
Subjective:  Patient ID: Lauren Orozco, female    DOB: 1956/01/10  Age: 63 y.o. MRN: [id number]  CC: Hypertension   HPI Lauren Orozco presents for f/up - She complains of a one-week history of bilateral flank pain.  She has a history of kidney stones.  She denies dysuria, hematuria, nausea, vomiting, fever, chills, loss of appetite, or weight loss.  She feels like she is retaining fluid but does not monitor her blood pressure.  She denies any recent episodes of headache, blurred vision, CP, DOE, palpitations, or fatigue.  Outpatient Medications Prior to Visit  Medication Sig Dispense Refill   gabapentin (NEURONTIN) 100 MG capsule Take 2 capsules (200 mg total) by mouth at bedtime. 60 capsule 3   rosuvastatin (CRESTOR) 20 MG tablet Take 1 tablet (20 mg total) by mouth daily. 90 tablet 1   Vitamin D, Ergocalciferol, (DRISDOL) 1.25 MG (50000 UT) CAPS capsule TAKE 1 CAPSULE BY MOUTH EVERY 7 DAYS 12 capsule 0   HYDROcodone-homatropine (HYCODAN) 5-1.5 MG/5ML syrup Take 5 mLs by mouth every 8 (eight) hours as needed for cough. 120 mL 0   No facility-administered medications prior to visit.     ROS Review of Systems  Constitutional: Negative.  Negative for chills, diaphoresis, fatigue and fever.  HENT: Negative.   Eyes: Negative for visual disturbance.  Respiratory: Negative for cough, chest tightness, shortness of breath and wheezing.   Cardiovascular: Negative for chest pain and leg swelling.  Gastrointestinal: Negative for abdominal pain, constipation, diarrhea, nausea and vomiting.  Endocrine: Negative.   Genitourinary: Positive for flank pain. Negative for decreased urine volume, difficulty urinating, dysuria, frequency, vaginal bleeding and vaginal discharge.  Musculoskeletal: Positive for arthralgias. Negative for back pain and myalgias.  Skin: Negative.   Neurological: Negative.  Negative for dizziness, weakness, light-headedness and headaches.    Hematological: Negative for adenopathy. Does not bruise/bleed easily.  Psychiatric/Behavioral: Negative.     Objective:  BP (!) 166/94 (BP Location: Left Arm, Patient Position: Sitting, Cuff Size: Large)    Pulse 72    Temp 98.6 F (37 C) (Oral)    Resp 16    Ht 5\' 6"  (1.676 m)    Wt 160 lb (72.6 kg)    SpO2 98%    BMI 25.82 kg/m   BP Readings from Last 3 Encounters:  04/05/19 (!) 166/94  01/02/19 (!) 141/86  12/22/18 128/76    Wt Readings from Last 3 Encounters:  04/05/19 160 lb (72.6 kg)  12/22/18 160 lb 7.9 oz (72.8 kg)  12/20/18 160 lb 8 oz (72.8 kg)    Physical Exam Vitals signs reviewed.  Constitutional:      Appearance: She is not ill-appearing or diaphoretic.  HENT:     Nose: Nose normal. No congestion or rhinorrhea.     Mouth/Throat:     Mouth: Mucous membranes are moist.     Pharynx: No oropharyngeal exudate or posterior oropharyngeal erythema.  Eyes:     General: No scleral icterus.    Conjunctiva/sclera: Conjunctivae normal.  Neck:     Musculoskeletal: Normal range of motion. No neck rigidity or muscular tenderness.  Cardiovascular:     Rate and Rhythm: Normal rate and regular rhythm.     Heart sounds: No murmur. No gallop.   Pulmonary:     Effort: Pulmonary effort is normal.     Breath sounds: No stridor. No wheezing, rhonchi or rales.  Abdominal:     General: Abdomen is flat. Bowel sounds are normal. There is  no distension.     Palpations: Abdomen is soft. There is no hepatomegaly, splenomegaly or mass.     Tenderness: There is no abdominal tenderness. There is no right CVA tenderness or left CVA tenderness.     Hernia: No hernia is present.  Musculoskeletal: Normal range of motion.     Right lower leg: No edema.     Left lower leg: No edema.  Lymphadenopathy:     Cervical: No cervical adenopathy.  Skin:    General: Skin is warm and dry.  Neurological:     General: No focal deficit present.     Mental Status: She is alert.  Psychiatric:         Mood and Affect: Mood normal.        Behavior: Behavior normal.     Lab Results  Component Value Date   WBC 5.4 04/05/2019   HGB 13.5 04/05/2019   HCT 40.0 04/05/2019   PLT 380.0 04/05/2019   GLUCOSE 108 (H) 04/05/2019   CHOL 239 (H) 04/05/2019   TRIG 105.0 04/05/2019   HDL 55.50 04/05/2019   LDLDIRECT 150.8 10/19/2011   LDLCALC 163 (H) 04/05/2019   ALT 25 12/20/2018   AST 16 12/20/2018   NA 142 04/05/2019   K 4.2 04/05/2019   CL 106 04/05/2019   CREATININE 0.79 04/05/2019   BUN 11 04/05/2019   CO2 28 04/05/2019   TSH 0.77 12/20/2018   HGBA1C 6.5 04/05/2019   MICROALBUR 0.9 12/20/2018    No results found.  Assessment & Plan:   Kessa was seen today for hypertension.  Diagnoses and all orders for this visit:  Hematuria of unknown cause-she has bilateral flank pain and hematuria.  She has a history of renal stones.  I will culture the urine to see if there is a bacterial infection.  We will periodically treat her for stone disease with an opioid and asked her to undergo a renal CT to see if there is a renal mass, renal stone, cyst, or abscess. -     POCT Urinalysis Dipstick (Automated) -     CULTURE, URINE COMPREHENSIVE; Future -     CBC with Differential/Platelet; Future -     CT RENAL STONE STUDY; Future  Bilateral flank pain -     CT RENAL STONE STUDY; Future -     oxyCODONE (OXY IR/ROXICODONE) 5 MG immediate release tablet; Take 1 tablet (5 mg total) by mouth every 4 (four) hours as needed for severe pain.  Essential hypertension - Her blood pressure is not adequately well controlled.  Labs are negative for secondary causes or endorgan damage.  I have asked her to start taking a thiazide diuretic. -     CBC with Differential/Platelet; Future -     VITAMIN D 25 Hydroxy (Vit-D Deficiency, Fractures); Future -     Magnesium; Future -     indapamide (LOZOL) 1.25 MG tablet; Take 1 tablet (1.25 mg total) by mouth daily.  Type II diabetes mellitus with manifestations  ([place])- Her blood sugar is adequately well controlled. -     Basic metabolic panel; Future -     Hemoglobin A1c; Future  Hyperlipidemia with target LDL less than 130- Her LDL has not gone down so I am assuming she is not compliant with the statin.  She has an elevated ASCVD risk score so I have asked her to restart the statin for CV risk reduction. -     CK; Future -     Lipid  panel; Future -     rosuvastatin (CRESTOR) 20 MG tablet; Take 1 tablet (20 mg total) by mouth daily.  Vitamin D deficiency disease- Her blood sugar is adequately well controlled. -     Cholecalciferol 50 MCG (2000 UT) TABS; Take 2 tablets (4,000 Units total) by mouth daily.  Thrombocytosis ([place])- Her platelet count is normal now. -     IBC panel; Future  Nephrolithiasis -     CT RENAL STONE STUDY; Future -     oxyCODONE (OXY IR/ROXICODONE) 5 MG immediate release tablet; Take 1 tablet (5 mg total) by mouth every 4 (four) hours as needed for severe pain.   I have discontinued [facility] Medal. [place]'s HYDROcodone-homatropine and Vitamin D (Ergocalciferol). I am also having her start on indapamide, oxyCODONE, and Cholecalciferol. Additionally, I am having her maintain her gabapentin and rosuvastatin.  Meds ordered this encounter  Medications   indapamide (LOZOL) 1.25 MG tablet    Sig: Take 1 tablet (1.25 mg total) by mouth daily.    Dispense:  90 tablet    Refill:  0   oxyCODONE (OXY IR/ROXICODONE) 5 MG immediate release tablet    Sig: Take 1 tablet (5 mg total) by mouth every 4 (four) hours as needed for severe pain.    Dispense:  30 tablet    Refill:  0   Cholecalciferol 50 MCG (2000 UT) TABS    Sig: Take 2 tablets (4,000 Units total) by mouth daily.    Dispense:  180 tablet    Refill:  1   rosuvastatin (CRESTOR) 20 MG tablet    Sig: Take 1 tablet (20 mg total) by mouth daily.    Dispense:  90 tablet    Refill:  1     Follow-up: Return in about 4 weeks (around 05/03/2019).  Scarlette Calico, MD

## 2019-04-05 NOTE — Patient Instructions (Signed)

## 2019-04-06 ENCOUNTER — Encounter: Payer: Self-pay | Admitting: Internal Medicine

## 2019-04-07 LAB — CULTURE, URINE COMPREHENSIVE
MICRO NUMBER:: 625981
SPECIMEN QUALITY:: ADEQUATE

## 2019-04-10 ENCOUNTER — Ambulatory Visit: Payer: Self-pay | Admitting: *Deleted

## 2019-04-10 ENCOUNTER — Emergency Department (HOSPITAL_BASED_OUTPATIENT_CLINIC_OR_DEPARTMENT_OTHER)
Admission: EM | Admit: 2019-04-10 | Discharge: 2019-04-10 | Disposition: A | Discharge status: Home / Self Care | Payer: Medicare Other | Attending: Emergency Medicine | Admitting: Emergency Medicine

## 2019-04-10 ENCOUNTER — Telehealth: Payer: Self-pay

## 2019-04-10 ENCOUNTER — Emergency Department (HOSPITAL_BASED_OUTPATIENT_CLINIC_OR_DEPARTMENT_OTHER): Payer: Medicare Other

## 2019-04-10 ENCOUNTER — Other Ambulatory Visit: Payer: Self-pay

## 2019-04-10 ENCOUNTER — Encounter (HOSPITAL_BASED_OUTPATIENT_CLINIC_OR_DEPARTMENT_OTHER): Payer: Self-pay | Admitting: *Deleted

## 2019-04-10 DIAGNOSIS — C8590 Non-Hodgkin lymphoma, unspecified, unspecified site: Secondary | ICD-10-CM | POA: Diagnosis not present

## 2019-04-10 DIAGNOSIS — R0789 Other chest pain: Secondary | ICD-10-CM | POA: Diagnosis not present

## 2019-04-10 DIAGNOSIS — R5383 Other fatigue: Secondary | ICD-10-CM | POA: Diagnosis not present

## 2019-04-10 DIAGNOSIS — Z87891 Personal history of nicotine dependence: Secondary | ICD-10-CM | POA: Insufficient documentation

## 2019-04-10 DIAGNOSIS — I10 Essential (primary) hypertension: Secondary | ICD-10-CM | POA: Diagnosis not present

## 2019-04-10 DIAGNOSIS — R002 Palpitations: Secondary | ICD-10-CM | POA: Insufficient documentation

## 2019-04-10 DIAGNOSIS — R079 Chest pain, unspecified: Secondary | ICD-10-CM | POA: Diagnosis not present

## 2019-04-10 DIAGNOSIS — E119 Type 2 diabetes mellitus without complications: Secondary | ICD-10-CM | POA: Insufficient documentation

## 2019-04-10 LAB — CBC
HCT: 42.2 % (ref 36.0–46.0)
Hemoglobin: 13.8 g/dL (ref 12.0–15.0)
MCH: 29.4 pg (ref 26.0–34.0)
MCHC: 32.7 g/dL (ref 30.0–36.0)
MCV: 89.8 fL (ref 80.0–100.0)
Platelets: 404 10*3/uL — ABNORMAL HIGH (ref 150–400)
RBC: 4.7 MIL/uL (ref 3.87–5.11)
RDW: 13.2 % (ref 11.5–15.5)
WBC: 7.3 10*3/uL (ref 4.0–10.5)
nRBC: 0 % (ref 0.0–0.2)

## 2019-04-10 LAB — TROPONIN I (HIGH SENSITIVITY)
Troponin I (High Sensitivity): <2 ng/L (ref <18)
Troponin I (High Sensitivity): 3 ng/L (ref <18)

## 2019-04-10 LAB — BASIC METABOLIC PANEL
Anion gap: 10 (ref 5–15)
BUN: 11 mg/dL (ref 8–23)
CO2: 26 mmol/L (ref 22–32)
Calcium: 9.9 mg/dL (ref 8.9–10.3)
Chloride: 102 mmol/L (ref 98–111)
Creatinine, Ser: 0.86 mg/dL (ref 0.44–1.00)
GFR calc Af Amer: >60 mL/min (ref >60)
GFR calc non Af Amer: >60 mL/min (ref >60)
Glucose, Bld: 104 mg/dL — ABNORMAL HIGH (ref 70–99)
Potassium: 3.2 mmol/L — ABNORMAL LOW (ref 3.5–5.1)
Sodium: 138 mmol/L (ref 135–145)

## 2019-04-10 MED ORDER — SODIUM CHLORIDE 0.9 % IV BOLUS
500.0000 mL | Freq: Once | INTRAVENOUS | Status: AC
  Administered 2019-04-10: 500 mL via INTRAVENOUS

## 2019-04-10 MED ORDER — SODIUM CHLORIDE 0.9% FLUSH
3.0000 mL | Freq: Once | INTRAVENOUS | Status: DC
  Filled 2019-04-10: qty 3

## 2019-04-10 NOTE — Telephone Encounter (Signed)
Pt called stating feels like her heart is racing. She can not tell what her heart rate is. She feels tired and is laying down and it still feels like it is going fast. She is also c/o that it feels like a pulled muscle in her shoulder blades. She denies shortness of breath, nausea, vomiting, dizziness. She also c/o having a headache. b/p 129/79.  She was started back on her medications for b/p, cholesterol and vitamin D last week. Did not finish triaging the patient. She is having someone drive her to [facility] ED. Routing to [facility] at [place] for review.     Reason for Disposition . Patient sounds very sick or weak to the triager  Answer Assessment - Initial Assessment Questions 1. DESCRIPTION: "Please describe your heart rate or heart beat that you are having" (e.g., fast/slow, regular/irregular, skipped or extra beats, "palpitations")     Fast heart beat 2. ONSET: "When did it start?" (Minutes, hours or days)      Last week 3. DURATION: "How long does it last" (e.g., seconds, minutes, hours)     On going 4. PATTERN "Does it come and go, or has it been constant since it started?"  "Does it get worse with exertion?"   "Are you feeling it now?"     *No Answer* 5. TAP: "Using your hand, can you tap out what you are feeling on a chair or table in front of you, so that I can hear?" (Note: not all patients can do this)       *No Answer* 6. HEART RATE: "Can you tell me your heart rate?" "How many beats in 15 seconds?"  (Note: not all patients can do this)       *No Answer* 7. RECURRENT SYMPTOM: "Have you ever had this before?" If so, ask: "When was the last time?" and "What happened that time?"      *No Answer* 8. CAUSE: "What do you think is causing the palpitations?"     *No Answer* 9. CARDIAC HISTORY: "Do you have any history of heart disease?" (e.g., heart attack, angina, bypass surgery, angioplasty, arrhythmia)      *No Answer* 10. OTHER SYMPTOMS: "Do you have any other  symptoms?" (e.g., dizziness, chest pain, sweating, difficulty breathing)       *No Answer* 11. PREGNANCY: "Is there any chance you are pregnant?" "When was your last menstrual period?"       *No Answer*  Protocols used: HEART RATE AND HEARTBEAT QUESTIONS-A-AH

## 2019-04-10 NOTE — Telephone Encounter (Signed)
Called her and told lab appt canceled because she recently had labs. To keep appt scheduled 7/9 with Dr. Alvy Bimler. She verbalized understanding.

## 2019-04-10 NOTE — ED Notes (Signed)
Pt c/o wait , pt demanding IV out and states she will follow up with her MD in AM , PA aware

## 2019-04-10 NOTE — Discharge Instructions (Signed)
You were evaluated in the emergency department for chest pressure and palpitations.  Work up today was reassuring.     Call your primary care doctor as soon as possible to establish care and further discussion and work up of your symptoms on an outpatient setting.   Stay well hydrated, rest. Resume all your medicines.   Please return to ED if: Your chest pain is worse or on exertion You have a cough that gets worse, or you cough up blood. You have severe pain in chest, back or abdomen. You have chest pain or shortness of breath with exertion or activity You have sudden, unexplained discomfort in your chest, with radiation arms, back, neck, or jaw. You suddenly have chest pain and begin to sweat, or your skin gets clammy. You feel chest pain with nausea or vomiting. You suddenly feel light-headed or faint. Your heart begins to beat quickly, or it feels like it is skipping beats. You have one sided leg swelling or calf pain

## 2019-04-10 NOTE — ED Triage Notes (Signed)
C/o mid sternal chest pain x 2 days also c/o dizziness and " palpitations"

## 2019-04-10 NOTE — ED Provider Notes (Signed)
[facility] EMERGENCY DEPARTMENT Provider Note   CSN: [id number] Arrival date & time: 04/10/19  1936    History   Chief Complaint Chief Complaint  Patient presents with  . Chest Pain    HPI Lauren Orozco is a 63 y.o. female with history of HTN, DM diet controlled, HLD, vitamin D deficiency nephrolithiasis non-Hodgkin's lymphoma currently on surveillance presents to the ER for evaluation of chest pressure.  She was standing up cooking when she suddenly felt bleeding pressure in her chest, her chest felt "heavy" at around 545.  She felt really drained, tired and laid down in bed and the chest pressure completely resolved.  While she was laying down she felt like her heart was beating fast and she could hear her heartbeat in her ears.  She checked her blood pressure and it was 129/79, heart rate 98.  Now she just feels really tired and "drained".  Reports right-sided chronic neck pain that radiates to her arm that is chronic and unchanged.  She wonders if symptoms are because of her new blood pressure and cholesterol medicines which she started taking 3 days ago for the first time.  The beating in her ears has apparently been there for a long time and states that "I tell my doctor all the time".  She is not a big eater but has had some snacks during the day.  Has only had a cup of coffee all day and no other fluids.  Admits that she is sleeping poorly due to significant stress with family.  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  She feels like she is being pulled and all sorts of different directions.  Her nephew called her to check in on her but she was too tired to even talk.  Currently feels better just tired.  Has had fleeting, intermittent palpitations since symptoms began but no more chest pain.  She denies any associated lightheadedness, presyncope, syncope, exertional chest pain or shortness of breath, diaphoresis, nausea, vomiting, back pain.  No  distal loss of sensation, weakness or paresthesias.  No known personal CAD.  No recent illnesses.  No recent vomiting, diarrhea, cough.  No urinary symptoms.  History of remote tobacco use, last used April 2020, 10 cigarettes a day since age 38.  No h/o DVT/PE.   HPI  Past Medical History:  Diagnosis Date  . Anxiety   . Chronic neck pain    with radiculopathy  . Chronic pain of left wrist 2009   "since neck surgery"  . Complication of anesthesia    Hard to wake up per pt; after neck sx had to be bagged.  Marland Kitchen DEPRESSION   . DISC DISEASE, CERVICAL   . Diverticulitis   . HYPERLIPIDEMIA   . Hypertension   . Lymphocytosis 01/02/2016  . NEPHROLITHIASIS, HX OF   . NHL (non-Hodgkin's lymphoma) ([place]) 12/25/2014  . Recurrent abdominal pain   . Umbilical hernia   . UTI'S, CHRONIC     Patient Active Problem List   Diagnosis Date Noted  . Bilateral flank pain 04/05/2019  . Hematuria of unknown cause 04/05/2019  . Thrombocytosis ([place]) 04/05/2019  . Nephrolithiasis 04/05/2019  . Vitamin D deficiency disease 12/20/2018  . Type II diabetes mellitus with manifestations ([place]) 10/13/2016  . Essential hypertension 01/02/2016  . Bilateral carpal tunnel syndrome 01/29/2015  . Low grade lymphoma, stage I ([place]) 12/25/2014  . Visit for screening mammogram 08/24/2014  . Constipation 10/03/2013  .  Routine health maintenance 10/20/2011  . Tobacco abuse 10/19/2011  . Hyperlipidemia with target LDL less than 130 12/22/2010  . Generalized OA 12/22/2010  . [place] DISEASE, CERVICAL 12/22/2010    Past Surgical History:  Procedure Laterality Date  . ABDOMINAL HYSTERECTOMY  2001  . APPENDECTOMY  1981  . KIDNEY STONE SURGERY  2007  . LEFT OOPHORECTOMY  2001  . MYOMECTOMY  1981  . NECK SURGERY  2009   fusion of C1, C2, C3     OB History   No obstetric history on file.      Home Medications    Prior to Admission medications   Medication Sig Start Date End Date Taking? Authorizing Provider   Cholecalciferol 50 MCG (2000 UT) TABS Take 2 tablets (4,000 Units total) by mouth daily. 04/05/19   Janith Lima, MD  gabapentin (NEURONTIN) 100 MG capsule Take 2 capsules (200 mg total) by mouth at bedtime. 12/06/17   Lyndal Pulley, DO  indapamide (LOZOL) 1.25 MG tablet Take 1 tablet (1.25 mg total) by mouth daily. 04/05/19   Janith Lima, MD  oxyCODONE (OXY IR/ROXICODONE) 5 MG immediate release tablet Take 1 tablet (5 mg total) by mouth every 4 (four) hours as needed for severe pain. 04/05/19   Janith Lima, MD  rosuvastatin (CRESTOR) 20 MG tablet Take 1 tablet (20 mg total) by mouth daily. 04/05/19   Janith Lima, MD    Family History Family History  Problem Relation Age of Onset  . Hypertension Mother   . Diabetes Mother   . Hyperlipidemia Mother   . Heart attack Father        MI  . Hyperlipidemia Father   . Hypertension Father   . Diabetes Father   . Heart disease Father   . Cancer Neg Hx        Negative for Breast or Colon Cancer  . Colon cancer Neg Hx     Social History Social History   Tobacco Use  . Smoking status: Former Smoker    Packs/day: 0.25    Years: 30.00    Pack years: 7.50    Types: Cigarettes  . Smokeless tobacco: Never Used  . Tobacco comment: stopped around march   Substance Use Topics  . Alcohol use: No  . Drug use: No     Allergies   Ciprofloxacin, Ofloxacin, and Other   Review of Systems Review of Systems  Constitutional: Positive for fatigue.  Cardiovascular: Positive for palpitations.       Chest pressure  All other systems reviewed and are negative.    Physical Exam Updated Vital Signs BP (!) 150/90   Pulse 96   Resp 18   Ht 5\' 6"  (1.676 m)   Wt 72.6 kg   SpO2 100%   BMI 25.82 kg/m   Physical Exam Constitutional:      Appearance: She is well-developed.     Comments: NAD. Non toxic.   HENT:     Head: Normocephalic and atraumatic.     Nose: Nose normal.  Eyes:     General: Lids are normal.      Conjunctiva/sclera: Conjunctivae normal.  Neck:     Musculoskeletal: Normal range of motion.     Trachea: Trachea normal.     Comments: Trachea midline.  Cardiovascular:     Rate and Rhythm: Normal rate and regular rhythm.     Pulses:          Radial pulses are 1+ on the right  side and 1+ on the left side.       Dorsalis pedis pulses are 1+ on the right side and 1+ on the left side.     Heart sounds: Normal heart sounds, S1 normal and S2 normal.     Comments: No LE edema or calf tenderness.  Pulmonary:     Effort: Pulmonary effort is normal.     Breath sounds: Normal breath sounds.  Abdominal:     General: Bowel sounds are normal.     Palpations: Abdomen is soft.     Tenderness: There is no abdominal tenderness.     Comments: No epigastric tenderness. No distention.  Soft.  No pulsatile masses.  Skin:    General: Skin is warm and dry.     Capillary Refill: Capillary refill takes less than 2 seconds.     Comments: No rash to chest wall  Neurological:     Mental Status: She is alert.     GCS: GCS eye subscore is 4. GCS verbal subscore is 5. GCS motor subscore is 6.     Comments: Sensation and strength intact to upper/lower extremities bilaterally  Psychiatric:        Speech: Speech normal.        Behavior: Behavior normal.        Thought Content: Thought content normal.      ED Treatments / Results  Labs (all labs ordered are listed, but only abnormal results are displayed) Labs Reviewed  BASIC METABOLIC PANEL - Abnormal; Notable for the following components:      Result Value   Potassium 3.2 (*)    Glucose, Bld 104 (*)    All other components within normal limits  CBC - Abnormal; Notable for the following components:   Platelets 404 (*)    All other components within normal limits  TROPONIN I (HIGH SENSITIVITY)  TROPONIN I (HIGH SENSITIVITY)    EKG EKG Interpretation  Date/Time:  Monday April 10 2019 19:43:35 EDT Ventricular Rate:  94 PR Interval:    QRS  Duration: 91 QT Interval:  363 QTC Calculation: 454 R Axis:   72 Text Interpretation:  Sinus rhythm Probable left atrial enlargement Confirmed by Davonna Belling [phone]) on 04/10/2019 8:27:04 PM   Radiology Dg Chest 2 View  Result Date: 04/10/2019 CLINICAL DATA:  Midsternal chest pain for 2 days, dizziness, palpitations. History of Hodgkin's disease. EXAM: CHEST - 2 VIEW COMPARISON:  Chest radiograph 12/20/2018 FINDINGS: Cardiac monitoring leads overlie the chest. Left lower lobe subsegmental atelectasis. The heart size and mediastinal contours are within normal limits. Both lungs are clear. Intact cervical spinal fusion hardware is partially visualized. The visualized skeletal structures are otherwise unremarkable. IMPRESSION: Left basilar atelectasis, otherwise no active cardiopulmonary disease. Electronically Signed   By: MD Lovena Le   On: 04/10/2019 20:31    Procedures Procedures (including critical care time)  Medications Ordered in ED Medications  sodium chloride flush (NS) 0.9 % injection 3 mL ( Intravenous Canceled Entry 04/10/19 1958)  sodium chloride 0.9 % bolus 500 mL ( Intravenous Stopped 04/10/19 2109)     Initial Impression / Assessment and Plan / ED Course  I have reviewed the triage vital signs and the nursing notes.  Pertinent labs & imaging results that were available during my care of the patient were reviewed by me and considered in my medical decision making (see chart for details).   Pt is a 63 y.o. female presents with what sounds like atypical CP. CP onset  at 1745 while cooking, resolved after laying down and has not returned. CP free in ER.  Symptoms described as pressure, non exertional, non pleuritic, non positional. No associated concerning features such as fever, cough, SOB. No recent illnesses. No light-headedness, syncope, pleuritic pain, leg swelling/calf pain to suggest DVT.  Cardiac risk factors include HTN, HLD, age, tobacco use.   VS WNL and stable.  CV and pulmonary exam benign. No murmurs. No LE edema or calf tenderness. No epigastric tenderness. No neuro or pulse deficits.   Work up benign. EKG non-ischemic.  Hs-trop < 2, delta pending.  No risk factors for PE/DVT, Wells Score is 0 and she has no pleuritic CP, SOB, hypoxemia, tachypnea, tachycardia and doubt PE. Heart score is 3 pending repeat trop.  No CP in ER/No return of CP in ER.     Patient will be handed off to Dr Alvino Chapel who will f/u on delta troponin. Anticipate discharge with PCP follow up.  Given symptomatology, exam, non ischemic cardiac work up in thus far, and HEART doubt ACS. Work up not suggestive of anemia, PE, PTX, dissection.  Likely atypical chest pain, possibly MSK etiology vs pleurisy vs costochondritis. She is under significant stress at home which could be contributing.  Work up unrevealing in regards to her palpitations. she has been on monitor with NSR.  Recommended oral hydration, rest. ED return preacutions given. Pt appears reliable for follow up, aware of symptoms that would warrant return to ER.  Pt is comfortable and agreeable with ER POC and discharge plan.    Final Clinical Impressions(s) / ED Diagnoses   Final diagnoses:  Chest pressure  Palpitations    ED Discharge Orders    None       Arlean Hopping 04/10/19 2126    Davonna Belling, MD 04/10/19 [id number]

## 2019-04-11 ENCOUNTER — Inpatient Hospital Stay: Payer: Medicare Other

## 2019-04-11 ENCOUNTER — Inpatient Hospital Stay: Payer: Medicare Other | Admitting: Hematology and Oncology

## 2019-04-13 ENCOUNTER — Other Ambulatory Visit: Payer: Self-pay

## 2019-04-13 ENCOUNTER — Inpatient Hospital Stay: Payer: Medicare Other | Attending: Hematology and Oncology | Admitting: Hematology and Oncology

## 2019-04-13 DIAGNOSIS — R591 Generalized enlarged lymph nodes: Secondary | ICD-10-CM | POA: Insufficient documentation

## 2019-04-13 DIAGNOSIS — F17210 Nicotine dependence, cigarettes, uncomplicated: Secondary | ICD-10-CM | POA: Insufficient documentation

## 2019-04-13 DIAGNOSIS — Z79899 Other long term (current) drug therapy: Secondary | ICD-10-CM | POA: Insufficient documentation

## 2019-04-13 DIAGNOSIS — C8590 Non-Hodgkin lymphoma, unspecified, unspecified site: Secondary | ICD-10-CM

## 2019-04-13 DIAGNOSIS — Z8572 Personal history of non-Hodgkin lymphomas: Secondary | ICD-10-CM | POA: Diagnosis not present

## 2019-04-13 DIAGNOSIS — Z720 Tobacco use: Secondary | ICD-10-CM

## 2019-04-14 ENCOUNTER — Telehealth: Payer: Self-pay

## 2019-04-14 ENCOUNTER — Encounter: Payer: Self-pay | Admitting: Hematology and Oncology

## 2019-04-14 NOTE — Telephone Encounter (Signed)
Called and given CT scan appt on 7/17 at 3 pm. Arrive at 2:45 for appt at [facility]. She verbalized understanding.

## 2019-04-14 NOTE — Assessment & Plan Note (Addendum)
She has no clinical signs or symptoms of lymphoma The palpable supraclavicular cyst is not consistent with lymphoma diagnosis I recommend CT imaging for further evaluation and she agreed

## 2019-04-14 NOTE — Progress Notes (Signed)
[facility] OFFICE PROGRESS NOTE  Patient Care Team: Janith Lima, MD as PCP - General (Internal Medicine) Heath Lark, MD as Consulting Physician (Hematology and Oncology) Lyndal Pulley, DO as Attending Physician (Family Medicine) Alda Berthold, DO as Consulting Physician (Neurology)  ASSESSMENT & PLAN:  Low grade lymphoma, stage I [facility]) She has no clinical signs or symptoms of lymphoma The palpable supraclavicular cyst is not consistent with lymphoma diagnosis I recommend CT imaging for further evaluation and she agreed  Tobacco abuse The patient has smoked for over 30 years and not willing to quit She had recent chest pain We discussed CT screening program to exclude lung cancer and she agreed to proceed   Orders Placed This Encounter  Procedures  . CT CHEST LUNG CA SCREEN LOW DOSE W/O CM    Standing Status:   Future    Standing Expiration Date:   06/14/2020    Order Specific Question:   Reason for Exam (SYMPTOM  OR DIAGNOSIS REQUIRED)    Answer:   chest pain, smoker    Order Specific Question:   Preferred Imaging Location?    Answer:   [facility]    Order Specific Question:   Radiology Contrast Protocol - do NOT remove file path    Answer:   [email or web address]    INTERVAL HISTORY: Please see below for problem oriented charting. She returns for further follow-up She was even recently evaluated in the emergency department due to chest pain Cardiac enzymes were negative She continues to smoke She is bothered by persistent swelling on the right supraclavicular region No other lymphadenopathy  SUMMARY OF ONCOLOGIC HISTORY: Oncology History  Low grade lymphoma, stage I ([place])  12/18/2014 Pathology Results   Accession: [id number] Flow cytometry showed NHL   01/01/2015 Imaging   CT scan of the chest was performed due to chest pressure and it came back negative.     REVIEW OF SYSTEMS:   Constitutional: Denies  fevers, chills or abnormal weight loss Eyes: Denies blurriness of vision Ears, nose, mouth, throat, and face: Denies mucositis or sore throat Respiratory: Denies cough, dyspnea or wheezes Cardiovascular: Denies palpitation, chest discomfort or lower extremity swelling Gastrointestinal:  Denies nausea, heartburn or change in bowel habits Skin: Denies abnormal skin rashes Lymphatics: Denies new lymphadenopathy or easy bruising Neurological:Denies numbness, tingling or new weaknesses Behavioral/Psych: Mood is stable, no new changes  All other systems were reviewed with the patient and are negative.  I have reviewed the past medical history, past surgical history, social history and family history with the patient and they are unchanged from previous note.  ALLERGIES:  is allergic to ciprofloxacin; ofloxacin; and other.  MEDICATIONS:  Current Outpatient Medications  Medication Sig Dispense Refill  . Cholecalciferol 50 MCG (2000 UT) TABS Take 2 tablets (4,000 Units total) by mouth daily. 180 tablet 1  . gabapentin (NEURONTIN) 100 MG capsule Take 2 capsules (200 mg total) by mouth at bedtime. 60 capsule 3  . indapamide (LOZOL) 1.25 MG tablet Take 1 tablet (1.25 mg total) by mouth daily. 90 tablet 0  . oxyCODONE (OXY IR/ROXICODONE) 5 MG immediate release tablet Take 1 tablet (5 mg total) by mouth every 4 (four) hours as needed for severe pain. 30 tablet 0  . rosuvastatin (CRESTOR) 20 MG tablet Take 1 tablet (20 mg total) by mouth daily. 90 tablet 1   No current facility-administered medications for this visit.     PHYSICAL EXAMINATION: ECOG PERFORMANCE STATUS: 1 - Symptomatic but  completely ambulatory  Vitals:   04/13/19 0855  BP: 137/80  Pulse: 82  Resp: 18  Temp: 98.9 F (37.2 C)  SpO2: 99%   Filed Weights   04/13/19 0855  Weight: 156 lb 12.8 oz (71.1 kg)    GENERAL:alert, no distress and comfortable SKIN: skin color, texture, turgor are normal, no rashes or significant  lesions EYES: normal, Conjunctiva are pink and non-injected, sclera clear OROPHARYNX:no exudate, no erythema and lips, buccal mucosa, and tongue normal  NECK: supple, thyroid normal size, non-tender, without nodularity LYMPH: The palpable abnormality on the right supraclavicular region is most consistent with a cyst LUNGS: clear to auscultation and percussion with normal breathing effort HEART: regular rate & rhythm and no murmurs and no lower extremity edema ABDOMEN:abdomen soft, non-tender and normal bowel sounds Musculoskeletal:no cyanosis of digits and no clubbing  NEURO: alert & oriented x 3 with fluent speech, no focal motor/sensory deficits  LABORATORY DATA:  I have reviewed the data as listed    Component Value Date/Time   NA 138 04/10/2019 1953   NA 140 01/02/2016 1034   K 3.2 (L) 04/10/2019 1953   K 4.0 01/02/2016 1034   CL 102 04/10/2019 1953   CO2 26 04/10/2019 1953   CO2 27 01/02/2016 1034   GLUCOSE 104 (H) 04/10/2019 1953   GLUCOSE 99 01/02/2016 1034   BUN 11 04/10/2019 1953   BUN 9.0 01/02/2016 1034   CREATININE 0.86 04/10/2019 1953   CREATININE 0.8 01/02/2016 1034   CALCIUM 9.9 04/10/2019 1953   CALCIUM 9.6 01/02/2016 1034   PROT 8.0 12/20/2018 1138   PROT 7.8 01/02/2016 1034   ALBUMIN 4.8 12/20/2018 1138   ALBUMIN 4.1 01/02/2016 1034   AST 16 12/20/2018 1138   AST 17 01/02/2016 1034   ALT 25 12/20/2018 1138   ALT 17 01/02/2016 1034   ALKPHOS 78 12/20/2018 1138   ALKPHOS 76 01/02/2016 1034   BILITOT 0.5 12/20/2018 1138   BILITOT 0.45 01/02/2016 1034   GFRNONAA >60 04/10/2019 1953   GFRAA >60 04/10/2019 1953    No results found for: SPEP, UPEP  Lab Results  Component Value Date   WBC 7.3 04/10/2019   NEUTROABS 1.6 04/05/2019   HGB 13.8 04/10/2019   HCT 42.2 04/10/2019   MCV 89.8 04/10/2019   PLT 404 (H) 04/10/2019      Chemistry      Component Value Date/Time   NA 138 04/10/2019 1953   NA 140 01/02/2016 1034   K 3.2 (L) 04/10/2019 1953    K 4.0 01/02/2016 1034   CL 102 04/10/2019 1953   CO2 26 04/10/2019 1953   CO2 27 01/02/2016 1034   BUN 11 04/10/2019 1953   BUN 9.0 01/02/2016 1034   CREATININE 0.86 04/10/2019 1953   CREATININE 0.8 01/02/2016 1034      Component Value Date/Time   CALCIUM 9.9 04/10/2019 1953   CALCIUM 9.6 01/02/2016 1034   ALKPHOS 78 12/20/2018 1138   ALKPHOS 76 01/02/2016 1034   AST 16 12/20/2018 1138   AST 17 01/02/2016 1034   ALT 25 12/20/2018 1138   ALT 17 01/02/2016 1034   BILITOT 0.5 12/20/2018 1138   BILITOT 0.45 01/02/2016 1034       RADIOGRAPHIC STUDIES: I have personally reviewed the radiological images as listed and agreed with the findings in the report. Dg Chest 2 View  Result Date: 04/10/2019 CLINICAL DATA:  Midsternal chest pain for 2 days, dizziness, palpitations. History of Hodgkin's disease. EXAM: CHEST - 2  VIEW COMPARISON:  Chest radiograph 12/20/2018 FINDINGS: Cardiac monitoring leads overlie the chest. Left lower lobe subsegmental atelectasis. The heart size and mediastinal contours are within normal limits. Both lungs are clear. Intact cervical spinal fusion hardware is partially visualized. The visualized skeletal structures are otherwise unremarkable. IMPRESSION: Left basilar atelectasis, otherwise no active cardiopulmonary disease. Electronically Signed   By: MD Lovena Le   On: 04/10/2019 20:31   Ct Renal Stone Study  Result Date: 04/05/2019 CLINICAL DATA:  Flank pain and urinary frequency. EXAM: CT ABDOMEN AND PELVIS WITHOUT CONTRAST TECHNIQUE: Multidetector CT imaging of the abdomen and pelvis was performed following the standard protocol without oral or IV contrast. COMPARISON:  September 20, 2013 FINDINGS: Lower chest: There is slight bibasilar atelectasis. No lung base edema or consolidation. Hepatobiliary: No focal liver lesions are demonstrable on this noncontrast enhanced study. There is no appreciable gallbladder wall thickening. There is no biliary duct dilatation.  Pancreas: There is no pancreatic mass or inflammatory focus. Spleen: No splenic lesions evident. Adrenals/Urinary Tract: Left adrenal appears normal. There is an apparent adenoma arising from the right adrenal measuring 2.7 x 1.8 cm. There is an apparent cyst arising from the right mid kidney measuring 1.5 x 1.5 cm, essentially stable. There is no evident hydronephrosis on either side. There is no evident renal or ureteral calculus on either side. Urinary bladder is midline with wall thickness within normal limits. Stomach/Bowel: There is no appreciable bowel wall or mesenteric thickening. There is no bowel obstruction. Terminal ileal region appears normal. There is no evident free air or portal venous air. Vascular/Lymphatic: There is no abdominal aortic aneurysm. There is aortic atherosclerosis in the aorta and iliac arteries. No adenopathy is evident in the abdomen or pelvis. Reproductive: Uterus is absent.  There is no evident pelvic mass. Other: Appendix is absent. There is no periappendiceal region inflammatory change. There is no abscess or ascites in the abdomen or pelvis. There is a small ventral hernia containing only fat. There is fat in the left inguinal ring. Musculoskeletal: No blastic or lytic bone lesions are evident. There is no intramuscular lesion. IMPRESSION: 1. No evident renal or ureteral calculus. No hydronephrosis on either side. Urinary bladder wall thickness within normal limits. 2. No evident bowel obstruction. No abscess in the abdomen or pelvis. Appendix is absent. There is no periappendiceal region inflammatory change. 3.  Stable benign right adrenal adenoma. 4.  Uterus absent. 5. Small ventral hernia containing only fat. Fat noted in left inguinal ring. 6.  Aortic and iliac artery atherosclerosis. Electronically Signed   By: Lowella Grip III M.D.   On: 04/05/2019 14:35    All questions were answered. The patient knows to call the clinic with any problems, questions or concerns.  No barriers to learning was detected.  I spent 15 minutes counseling the patient face to face. The total time spent in the appointment was 20 minutes and more than 50% was on counseling and review of test results  Heath Lark, MD 04/14/2019 10:38 AM

## 2019-04-14 NOTE — Assessment & Plan Note (Signed)
The patient has smoked for over 30 years and not willing to quit She had recent chest pain We discussed CT screening program to exclude lung cancer and she agreed to proceed

## 2019-04-19 ENCOUNTER — Telehealth: Payer: Self-pay | Admitting: Hematology and Oncology

## 2019-04-19 NOTE — Telephone Encounter (Signed)
Scheduled appt per 7/14 sch message - pt aware - my chart active and states she will check m,y chart

## 2019-04-21 ENCOUNTER — Other Ambulatory Visit: Payer: Self-pay

## 2019-04-21 ENCOUNTER — Ambulatory Visit (HOSPITAL_COMMUNITY)
Admission: RE | Admit: 2019-04-21 | Discharge: 2019-04-21 | Disposition: A | Discharge status: Home / Self Care | Payer: Medicare Other | Source: Ambulatory Visit | Attending: Hematology and Oncology | Admitting: Hematology and Oncology

## 2019-04-21 DIAGNOSIS — Z720 Tobacco use: Secondary | ICD-10-CM | POA: Diagnosis present

## 2019-04-21 DIAGNOSIS — I700 Atherosclerosis of aorta: Secondary | ICD-10-CM | POA: Diagnosis not present

## 2019-04-21 DIAGNOSIS — F17210 Nicotine dependence, cigarettes, uncomplicated: Secondary | ICD-10-CM | POA: Insufficient documentation

## 2019-04-21 DIAGNOSIS — C8590 Non-Hodgkin lymphoma, unspecified, unspecified site: Secondary | ICD-10-CM | POA: Diagnosis not present

## 2019-04-21 DIAGNOSIS — J439 Emphysema, unspecified: Secondary | ICD-10-CM | POA: Insufficient documentation

## 2019-04-21 DIAGNOSIS — D3501 Benign neoplasm of right adrenal gland: Secondary | ICD-10-CM | POA: Insufficient documentation

## 2019-04-24 ENCOUNTER — Inpatient Hospital Stay (HOSPITAL_BASED_OUTPATIENT_CLINIC_OR_DEPARTMENT_OTHER): Payer: Medicare Other | Admitting: Hematology and Oncology

## 2019-04-24 ENCOUNTER — Other Ambulatory Visit: Payer: Self-pay

## 2019-04-24 DIAGNOSIS — F17210 Nicotine dependence, cigarettes, uncomplicated: Secondary | ICD-10-CM | POA: Diagnosis not present

## 2019-04-24 DIAGNOSIS — Z8572 Personal history of non-Hodgkin lymphomas: Secondary | ICD-10-CM | POA: Diagnosis not present

## 2019-04-24 DIAGNOSIS — Z720 Tobacco use: Secondary | ICD-10-CM

## 2019-04-24 DIAGNOSIS — C8590 Non-Hodgkin lymphoma, unspecified, unspecified site: Secondary | ICD-10-CM

## 2019-04-24 DIAGNOSIS — R591 Generalized enlarged lymph nodes: Secondary | ICD-10-CM

## 2019-04-24 DIAGNOSIS — Z79899 Other long term (current) drug therapy: Secondary | ICD-10-CM | POA: Diagnosis not present

## 2019-04-24 DIAGNOSIS — Z122 Encounter for screening for malignant neoplasm of respiratory organs: Secondary | ICD-10-CM

## 2019-04-25 ENCOUNTER — Encounter: Payer: Self-pay | Admitting: Hematology and Oncology

## 2019-04-25 ENCOUNTER — Telehealth: Payer: Self-pay | Admitting: Hematology and Oncology

## 2019-04-25 NOTE — Assessment & Plan Note (Signed)
The patient has smoked for over 30 years and not willing to quit We reviewed CT imaging She appears to be interested to quit smoking

## 2019-04-25 NOTE — Assessment & Plan Note (Signed)
I have reviewed CT imaging with her There are no signs of cancer Due to her smoking history, she would benefit from another annual CT screening based on recommendation by radiologist I will see her back next year for further follow-up 

## 2019-04-25 NOTE — Progress Notes (Signed)
[place] OFFICE PROGRESS NOTE  Patient Care Team: Janith Lima, MD as PCP - General (Internal Medicine) Heath Lark, MD as Consulting Physician (Hematology and Oncology) Lyndal Pulley, DO as Attending Physician (Family Medicine) Alda Berthold, DO as Consulting Physician (Neurology)  ASSESSMENT & PLAN:  Low grade lymphoma, stage I [facility]) I have reviewed CT imaging with her There are no signs of cancer Due to her smoking history, she would benefit from another annual CT screening based on recommendation by radiologist I will see her back next year for further follow-up  Tobacco abuse The patient has smoked for over 30 years and not willing to quit We reviewed CT imaging She appears to be interested to quit smoking   Orders Placed This Encounter  Procedures  . CT CHEST LUNG CA SCREEN LOW DOSE W/O CM    Standing Status:   Future    Standing Expiration Date:   06/25/2020    Order Specific Question:   Reason for Exam (SYMPTOM  OR DIAGNOSIS REQUIRED)    Answer:   luung cancer screen    Order Specific Question:   Preferred Imaging Location?    Answer:   [facility]    Order Specific Question:   Radiology Contrast Protocol - do NOT remove file path    Answer:   [email or web address]  . Comprehensive metabolic panel    Standing Status:   Future    Standing Expiration Date:   05/29/2020  . CBC with Differential/Platelet    Standing Status:   Future    Standing Expiration Date:   05/29/2020    INTERVAL HISTORY: Please see below for problem oriented charting. She returns to review CT imaging She denies recent chest pain or shortness of breath She is attempting to quit smoking  SUMMARY OF ONCOLOGIC HISTORY: Oncology History  Low grade lymphoma, stage I ([place])  12/18/2014 Pathology Results   Accession: [id number] Flow cytometry showed NHL   01/01/2015 Imaging   CT scan of the chest was performed due to chest pressure and it came  back negative.     REVIEW OF SYSTEMS:   Constitutional: Denies fevers, chills or abnormal weight loss Eyes: Denies blurriness of vision Ears, nose, mouth, throat, and face: Denies mucositis or sore throat Respiratory: Denies cough, dyspnea or wheezes Cardiovascular: Denies palpitation, chest discomfort or lower extremity swelling Gastrointestinal:  Denies nausea, heartburn or change in bowel habits Skin: Denies abnormal skin rashes Lymphatics: Denies new lymphadenopathy or easy bruising Neurological:Denies numbness, tingling or new weaknesses Behavioral/Psych: Mood is stable, no new changes  All other systems were reviewed with the patient and are negative.  I have reviewed the past medical history, past surgical history, social history and family history with the patient and they are unchanged from previous note.  ALLERGIES:  is allergic to ciprofloxacin; ofloxacin; and other.  MEDICATIONS:  Current Outpatient Medications  Medication Sig Dispense Refill  . Cholecalciferol 50 MCG (2000 UT) TABS Take 2 tablets (4,000 Units total) by mouth daily. 180 tablet 1  . gabapentin (NEURONTIN) 100 MG capsule Take 2 capsules (200 mg total) by mouth at bedtime. 60 capsule 3  . indapamide (LOZOL) 1.25 MG tablet Take 1 tablet (1.25 mg total) by mouth daily. 90 tablet 0  . oxyCODONE (OXY IR/ROXICODONE) 5 MG immediate release tablet Take 1 tablet (5 mg total) by mouth every 4 (four) hours as needed for severe pain. 30 tablet 0  . rosuvastatin (CRESTOR) 20 MG tablet Take 1 tablet (20  mg total) by mouth daily. 90 tablet 1   No current facility-administered medications for this visit.     PHYSICAL EXAMINATION: ECOG PERFORMANCE STATUS: 0 - Asymptomatic  Vitals:   04/24/19 1202  BP: 136/79  Pulse: 80  Resp: 18  Temp: 98.5 F (36.9 C)  SpO2: 98%   Filed Weights   04/24/19 1202  Weight: 156 lb 9.6 oz (71 kg)    GENERAL:alert, no distress and comfortable Musculoskeletal:no cyanosis of  digits and no clubbing  NEURO: alert & oriented x 3 with fluent speech, no focal motor/sensory deficits  LABORATORY DATA:  I have reviewed the data as listed    Component Value Date/Time   NA 138 04/10/2019 1953   NA 140 01/02/2016 1034   K 3.2 (L) 04/10/2019 1953   K 4.0 01/02/2016 1034   CL 102 04/10/2019 1953   CO2 26 04/10/2019 1953   CO2 27 01/02/2016 1034   GLUCOSE 104 (H) 04/10/2019 1953   GLUCOSE 99 01/02/2016 1034   BUN 11 04/10/2019 1953   BUN 9.0 01/02/2016 1034   CREATININE 0.86 04/10/2019 1953   CREATININE 0.8 01/02/2016 1034   CALCIUM 9.9 04/10/2019 1953   CALCIUM 9.6 01/02/2016 1034   PROT 8.0 12/20/2018 1138   PROT 7.8 01/02/2016 1034   ALBUMIN 4.8 12/20/2018 1138   ALBUMIN 4.1 01/02/2016 1034   AST 16 12/20/2018 1138   AST 17 01/02/2016 1034   ALT 25 12/20/2018 1138   ALT 17 01/02/2016 1034   ALKPHOS 78 12/20/2018 1138   ALKPHOS 76 01/02/2016 1034   BILITOT 0.5 12/20/2018 1138   BILITOT 0.45 01/02/2016 1034   GFRNONAA >60 04/10/2019 1953   GFRAA >60 04/10/2019 1953    No results found for: SPEP, UPEP  Lab Results  Component Value Date   WBC 7.3 04/10/2019   NEUTROABS 1.6 04/05/2019   HGB 13.8 04/10/2019   HCT 42.2 04/10/2019   MCV 89.8 04/10/2019   PLT 404 (H) 04/10/2019      Chemistry      Component Value Date/Time   NA 138 04/10/2019 1953   NA 140 01/02/2016 1034   K 3.2 (L) 04/10/2019 1953   K 4.0 01/02/2016 1034   CL 102 04/10/2019 1953   CO2 26 04/10/2019 1953   CO2 27 01/02/2016 1034   BUN 11 04/10/2019 1953   BUN 9.0 01/02/2016 1034   CREATININE 0.86 04/10/2019 1953   CREATININE 0.8 01/02/2016 1034      Component Value Date/Time   CALCIUM 9.9 04/10/2019 1953   CALCIUM 9.6 01/02/2016 1034   ALKPHOS 78 12/20/2018 1138   ALKPHOS 76 01/02/2016 1034   AST 16 12/20/2018 1138   AST 17 01/02/2016 1034   ALT 25 12/20/2018 1138   ALT 17 01/02/2016 1034   BILITOT 0.5 12/20/2018 1138   BILITOT 0.45 01/02/2016 1034        RADIOGRAPHIC STUDIES: I have reviewed CT imaging with the patient I have personally reviewed the radiological images as listed and agreed with the findings in the report. Dg Chest 2 View  Result Date: 04/10/2019 CLINICAL DATA:  Midsternal chest pain for 2 days, dizziness, palpitations. History of Hodgkin's disease. EXAM: CHEST - 2 VIEW COMPARISON:  Chest radiograph 12/20/2018 FINDINGS: Cardiac monitoring leads overlie the chest. Left lower lobe subsegmental atelectasis. The heart size and mediastinal contours are within normal limits. Both lungs are clear. Intact cervical spinal fusion hardware is partially visualized. The visualized skeletal structures are otherwise unremarkable. IMPRESSION: Left basilar atelectasis,  otherwise no active cardiopulmonary disease. Electronically Signed   By: MD Lovena Le   On: 04/10/2019 20:31   Ct Chest Lung Ca Screen Low Dose W/o Cm  Result Date: 04/24/2019 CLINICAL DATA:  Current smoker, 30 pack-year history. EXAM: CT CHEST WITHOUT CONTRAST LOW-DOSE FOR LUNG CANCER SCREENING TECHNIQUE: Multidetector CT imaging of the chest was performed following the standard protocol without IV contrast. COMPARISON:  01/07/2018. FINDINGS: Cardiovascular: Atherosclerotic calcification of the aorta. Heart size normal. No pericardial effusion. Mediastinum/Nodes: No pathologically enlarged mediastinal or axillary lymph nodes. Hilar regions are difficult to evaluate without IV contrast. Esophagus is grossly unremarkable. Lungs/Pleura: Mild centrilobular and paraseptal emphysema. Mild basilar scarring/atelectasis. No worrisome pulmonary nodules. No pleural fluid. Airway is unremarkable. Upper Abdomen: Visualized portion of the liver is unremarkable. Fluid density mass in the right adrenal gland measures 2.8 cm, as before. There may be nodular thickening of the medial limb left adrenal gland, partially imaged. Visualized portions of the kidneys, spleen, pancreas, stomach and bowel are  otherwise grossly unremarkable. Musculoskeletal: Degenerative changes in the spine. No worrisome lytic or sclerotic lesions. IMPRESSION: 1. Lung-RADS 1, negative. Continue annual screening with low-dose chest CT without contrast in 12 months. 2. Right adrenal adenoma. 3.  Aortic atherosclerosis (ICD10-170.0). 4.  Emphysema (ICD10-J43.9). Electronically Signed   By: Lorin Picket M.D.   On: 04/24/2019 08:12   Ct Renal Stone Study  Result Date: 04/05/2019 CLINICAL DATA:  Flank pain and urinary frequency. EXAM: CT ABDOMEN AND PELVIS WITHOUT CONTRAST TECHNIQUE: Multidetector CT imaging of the abdomen and pelvis was performed following the standard protocol without oral or IV contrast. COMPARISON:  September 20, 2013 FINDINGS: Lower chest: There is slight bibasilar atelectasis. No lung base edema or consolidation. Hepatobiliary: No focal liver lesions are demonstrable on this noncontrast enhanced study. There is no appreciable gallbladder wall thickening. There is no biliary duct dilatation. Pancreas: There is no pancreatic mass or inflammatory focus. Spleen: No splenic lesions evident. Adrenals/Urinary Tract: Left adrenal appears normal. There is an apparent adenoma arising from the right adrenal measuring 2.7 x 1.8 cm. There is an apparent cyst arising from the right mid kidney measuring 1.5 x 1.5 cm, essentially stable. There is no evident hydronephrosis on either side. There is no evident renal or ureteral calculus on either side. Urinary bladder is midline with wall thickness within normal limits. Stomach/Bowel: There is no appreciable bowel wall or mesenteric thickening. There is no bowel obstruction. Terminal ileal region appears normal. There is no evident free air or portal venous air. Vascular/Lymphatic: There is no abdominal aortic aneurysm. There is aortic atherosclerosis in the aorta and iliac arteries. No adenopathy is evident in the abdomen or pelvis. Reproductive: Uterus is absent.  There is no  evident pelvic mass. Other: Appendix is absent. There is no periappendiceal region inflammatory change. There is no abscess or ascites in the abdomen or pelvis. There is a small ventral hernia containing only fat. There is fat in the left inguinal ring. Musculoskeletal: No blastic or lytic bone lesions are evident. There is no intramuscular lesion. IMPRESSION: 1. No evident renal or ureteral calculus. No hydronephrosis on either side. Urinary bladder wall thickness within normal limits. 2. No evident bowel obstruction. No abscess in the abdomen or pelvis. Appendix is absent. There is no periappendiceal region inflammatory change. 3.  Stable benign right adrenal adenoma. 4.  Uterus absent. 5. Small ventral hernia containing only fat. Fat noted in left inguinal ring. 6.  Aortic and iliac artery atherosclerosis. Electronically Signed  By: Lowella Grip III M.D.   On: 04/05/2019 14:35    All questions were answered. The patient knows to call the clinic with any problems, questions or concerns. No barriers to learning was detected.  I spent 10 minutes counseling the patient face to face. The total time spent in the appointment was 15 minutes and more than 50% was on counseling and review of test results  Heath Lark, MD 04/25/2019 2:53 PM

## 2019-04-25 NOTE — Telephone Encounter (Signed)
Scheduled appt per 7/21 sch message- f./u in one year - sent reminder letter in the mail with appt date and time

## 2019-05-15 ENCOUNTER — Encounter: Payer: Self-pay | Admitting: Internal Medicine

## 2019-05-25 ENCOUNTER — Other Ambulatory Visit: Payer: Self-pay

## 2019-05-25 DIAGNOSIS — Z20822 Contact with and (suspected) exposure to covid-19: Secondary | ICD-10-CM

## 2019-05-27 LAB — NOVEL CORONAVIRUS, NAA: SARS-CoV-2, NAA: NOT DETECTED

## 2019-06-02 ENCOUNTER — Ambulatory Visit: Payer: Self-pay | Admitting: *Deleted

## 2019-06-02 ENCOUNTER — Other Ambulatory Visit: Payer: Self-pay

## 2019-06-02 DIAGNOSIS — Z20822 Contact with and (suspected) exposure to covid-19: Secondary | ICD-10-CM

## 2019-06-02 DIAGNOSIS — R6889 Other general symptoms and signs: Secondary | ICD-10-CM | POA: Diagnosis not present

## 2019-06-02 NOTE — Telephone Encounter (Signed)
Summary: Daughter positive for covid.    Pt called and stated that her daughter tested positive for covid and would like to know what she should do. Please advise      Call to patient- patient's daughter has tested + COVID- patient states they are isolating from each other- but her last exposure was yesterday. Patient states she is getting tested this afternoon. Advisement given for home precautions and patient advise to monitor for symptoms and call PCP with any changes. Patient is going to isolate until she gets results back.  Reason for Disposition . [1] COVID-19 EXPOSURE (Close Contact) AND [2] within last 14 days BUT [3] NO symptoms  Answer Assessment - Initial Assessment Questions 1. CLOSE CONTACT: "Who is the person with the confirmed or suspected COVID-19 infection that you were exposed to?"     daughter 2. PLACE of CONTACT: "Where were you when you were exposed to COVID-19?" (e.g., home, school, medical waiting room; which city?)     home 3. TYPE of CONTACT: "How much contact was there?" (e.g., sitting next to, live in same house, work in same office, same building)     Lives in the home 4. DURATION of CONTACT: "How long were you in contact with the COVID-19 patient?" (e.g., a few seconds, passed by person, a few minutes, live with the patient)     Lives with patient 5. DATE of CONTACT: "When did you have contact with a COVID-19 patient?" (e.g., how many days ago)     Last close contact- Thursday- yesterday 6. TRAVEL: "Have you traveled out of the country recently?" If so, "When and where?"     * Also ask about out-of-state travel, since the CDC has identified some high-risk cities for community spread in the [place].     * Note: Travel becomes less relevant if there is widespread community transmission where the patient lives.     Community spread, daughter went to [place] 7. COMMUNITY SPREAD: "Are there lots of cases of COVID-19 (community spread) where you live?" (See public health  department website, if unsure)       Community spread 8. SYMPTOMS: "Do you have any symptoms?" (e.g., fever, cough, breathing difficulty)     No symptoms- patient has plans to get retested 9. PREGNANCY OR POSTPARTUM: "Is there any chance you are pregnant?" "When was your last menstrual period?" "Did you deliver in the last 2 weeks?"     n/a 10. HIGH RISK: "Do you have any heart or lung problems? Do you have a weak immune system?" (e.g., CHF, COPD, asthma, HIV positive, chemotherapy, renal failure, diabetes mellitus, sickle cell anemia)       Age, high BP, borderline dieabetes  Protocols used: CORONAVIRUS (COVID-19) EXPOSURE-A-AH

## 2019-06-03 LAB — NOVEL CORONAVIRUS, NAA: SARS-CoV-2, NAA: NOT DETECTED

## 2019-06-26 ENCOUNTER — Other Ambulatory Visit: Payer: Self-pay | Admitting: Internal Medicine

## 2019-06-26 DIAGNOSIS — I10 Essential (primary) hypertension: Secondary | ICD-10-CM

## 2019-06-26 MED ORDER — INDAPAMIDE 1.25 MG PO TABS: 1.2500 mg | ORAL_TABLET | Freq: Every day | ORAL | 0 refills | Status: DC

## 2019-07-24 ENCOUNTER — Ambulatory Visit: Payer: Self-pay | Admitting: *Deleted

## 2019-07-24 NOTE — Telephone Encounter (Signed)
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 and feels this is causing the increased heart rate and shortness of breath---I have explained that ED doctor diagnosed her with atypical chest pain, so no significant findings with EKG,troponin labs,other labs that would suggest anything heart related---patient states she takes her blood pressure at home daily, always has readings in [id number] systolic and lower [id number] diastolic--heart rate in [id number] n/v/d, afebrile,no headache or dizziness, no vision changes----patient wants to wait to see dr Ronnald Ramp in the office with ED follow up---dr jones returns next week, patient has scheduled appt on 10/27 with dr jones---patient advised that if symptoms worsen, go to ED

## 2019-07-24 NOTE — Telephone Encounter (Signed)
Patient states she is having elevations in her heart rate that come and go. Patient is having them today- she states that when it occurs - she has to lay down and rest because she gets SOB at times. Patient advised she needs to go to ED for this- but she declines- she wants to see her PCP. She states it is not emergency.  Reason for Disposition . Difficulty breathing  Answer Assessment - Initial Assessment Questions 1. DESCRIPTION: "Please describe your heart rate or heart beat that you are having" (e.g., fast/slow, regular/irregular, skipped or extra beats, "palpitations")     fatigued 2. ONSET: "When did it start?" (Minutes, hours or days)      Heart rate is usually 80/85 3. DURATION: "How long does it last" (e.g., seconds, minutes, hours)     hours 4. PATTERN "Does it come and go, or has it been constant since it started?"  "Does it get worse with exertion?"   "Are you feeling it now?"     Comes and goes, worse with exertion, yes 5. TAP: "Using your hand, can you tap out what you are feeling on a chair or table in front of you, so that I can hear?" (Note: not all patients can do this)       n/a 6. HEART RATE: "Can you tell me your heart rate?" "How many beats in 15 seconds?"  (Note: not all patients can do this)       Not sure 7. RECURRENT SYMPTOM: "Have you ever had this before?" If so, ask: "When was the last time?" and "What happened that time?"      Patient has had high heart rate before- stress 8. 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 CARDIAC HISTORY: "Do you have any history of heart disease?" (e.g., heart attack, angina, bypass surgery, angioplasty, arrhythmia)      no 10. OTHER SYMPTOMS: "Do you have any other symptoms?" (e.g., dizziness, chest pain, sweating, difficulty breathing)       Patient can tell a difference with her breathing- when heart rate is up 11. PREGNANCY: "Is there any chance you are pregnant?" "When was  your last menstrual period?"       n/a  Protocols used: HEART RATE AND HEARTBEAT QUESTIONS-A-AH

## 2019-08-01 ENCOUNTER — Encounter: Payer: Self-pay | Admitting: Internal Medicine

## 2019-08-01 ENCOUNTER — Ambulatory Visit (INDEPENDENT_AMBULATORY_CARE_PROVIDER_SITE_OTHER): Payer: Medicare Other | Admitting: Internal Medicine

## 2019-08-01 ENCOUNTER — Other Ambulatory Visit: Payer: Self-pay

## 2019-08-01 ENCOUNTER — Other Ambulatory Visit (INDEPENDENT_AMBULATORY_CARE_PROVIDER_SITE_OTHER): Payer: Medicare Other

## 2019-08-01 VITALS — BP 138/84 | HR 74 | Temp 98.4°F | Resp 16 | Ht 66.0 in | Wt 161.0 lb

## 2019-08-01 DIAGNOSIS — R002 Palpitations: Secondary | ICD-10-CM | POA: Diagnosis not present

## 2019-08-01 DIAGNOSIS — E118 Type 2 diabetes mellitus with unspecified complications: Secondary | ICD-10-CM

## 2019-08-01 DIAGNOSIS — E785 Hyperlipidemia, unspecified: Secondary | ICD-10-CM

## 2019-08-01 DIAGNOSIS — I10 Essential (primary) hypertension: Secondary | ICD-10-CM | POA: Diagnosis not present

## 2019-08-01 DIAGNOSIS — D75839 Thrombocytosis, unspecified: Secondary | ICD-10-CM

## 2019-08-01 DIAGNOSIS — D473 Essential (hemorrhagic) thrombocythemia: Secondary | ICD-10-CM

## 2019-08-01 LAB — CBC WITH DIFFERENTIAL/PLATELET
Basophils Absolute: 0 10*3/uL (ref 0.0–0.1)
Basophils Relative: 0.8 % (ref 0.0–3.0)
Eosinophils Absolute: 0.1 10*3/uL (ref 0.0–0.7)
Eosinophils Relative: 1.5 % (ref 0.0–5.0)
HCT: 40.5 % (ref 36.0–46.0)
Hemoglobin: 13.4 g/dL (ref 12.0–15.0)
Lymphocytes Relative: 57.6 % — ABNORMAL HIGH (ref 12.0–46.0)
Lymphs Abs: 3.2 10*3/uL (ref 0.7–4.0)
MCHC: 33.1 g/dL (ref 30.0–36.0)
MCV: 90.7 fl (ref 78.0–100.0)
Monocytes Absolute: 0.6 10*3/uL (ref 0.1–1.0)
Monocytes Relative: 10.4 % (ref 3.0–12.0)
Neutro Abs: 1.7 10*3/uL (ref 1.4–7.7)
Neutrophils Relative %: 29.7 % — ABNORMAL LOW (ref 43.0–77.0)
Platelets: 366 10*3/uL (ref 150.0–400.0)
RBC: 4.46 Mil/uL (ref 3.87–5.11)
RDW: 13.7 % (ref 11.5–15.5)
WBC: 5.6 10*3/uL (ref 4.0–10.5)

## 2019-08-01 LAB — BASIC METABOLIC PANEL
BUN: 14 mg/dL (ref 6–23)
CO2: 31 mEq/L (ref 19–32)
Calcium: 9.7 mg/dL (ref 8.4–10.5)
Chloride: 101 mEq/L (ref 96–112)
Creatinine, Ser: 0.72 mg/dL (ref 0.40–1.20)
GFR: 98.97 mL/min (ref >60.00)
Glucose, Bld: 113 mg/dL — ABNORMAL HIGH (ref 70–99)
Potassium: 3.7 mEq/L (ref 3.5–5.1)
Sodium: 138 mEq/L (ref 135–145)

## 2019-08-01 LAB — MAGNESIUM: Magnesium: 1.8 mg/dL (ref 1.5–2.5)

## 2019-08-01 LAB — HEMOGLOBIN A1C: Hgb A1c MFr Bld: 6.7 % — ABNORMAL HIGH (ref 4.6–6.5)

## 2019-08-01 MED ORDER — ROSUVASTATIN CALCIUM 20 MG PO TABS: 20.0000 mg | ORAL_TABLET | Freq: Every day | ORAL | 1 refills | Status: DC

## 2019-08-01 NOTE — Patient Instructions (Signed)

## 2019-08-01 NOTE — Patient Outreach (Signed)
[name]) Care Management  08/01/2019  [name] [birth year] [id number]   Medication Adherence call to [name] Voice message left with a call back number. Lauren Orozco is showing past due on Rosuvastatin 20 mg under [place].   [place] Management Direct Dial [phone]  Fax [phone] [name]

## 2019-08-01 NOTE — Progress Notes (Signed)
Subjective:  Patient ID: Lauren Orozco, female    DOB: 09/18/1956  Age: 63 y.o. MRN: [id number]  CC: Hypertension   HPI Lauren Orozco presents for f/up - She complains of a 62-month history of palpitations.  She feels this every single day.  She complains that her heart rate is elevated when she is laying in the bed trying to sleep.  She has been monitoring her heart rate and the highest that she has noted was a pulse of 98.  She does not feel the sensation of skipped beats, extra beats, or low heart rate. 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  When her heart rate is elevated she does not experience dizziness, lightheadedness, chest pain, shortness of breath, diaphoresis, or edema.  Outpatient Medications Prior to Visit  Medication Sig Dispense Refill   Cholecalciferol 50 MCG (2000 UT) TABS Take 2 tablets (4,000 Units total) by mouth daily. 180 tablet 1   gabapentin (NEURONTIN) 100 MG capsule Take 2 capsules (200 mg total) by mouth at bedtime. 60 capsule 3   indapamide (LOZOL) 1.25 MG tablet Take 1 tablet (1.25 mg total) by mouth daily. 90 tablet 0   oxyCODONE (OXY IR/ROXICODONE) 5 MG immediate release tablet Take 1 tablet (5 mg total) by mouth every 4 (four) hours as needed for severe pain. 30 tablet 0   rosuvastatin (CRESTOR) 20 MG tablet Take 1 tablet (20 mg total) by mouth daily. (Patient not taking: Reported on 08/01/2019) 90 tablet 1   No facility-administered medications prior to visit.     ROS Review of Systems  Constitutional: Negative for diaphoresis, fatigue and unexpected weight change.  HENT: Negative.   Eyes: Negative for visual disturbance.  Respiratory: Negative for cough, chest tightness, shortness of breath and wheezing.   Cardiovascular: Positive for palpitations. Negative for chest pain and leg swelling.  Gastrointestinal: Negative for abdominal pain, constipation, diarrhea, nausea and vomiting.    Endocrine: Negative.   Genitourinary: Negative.  Negative for difficulty urinating.  Musculoskeletal: Negative.  Negative for arthralgias and myalgias.  Skin: Negative.  Negative for color change.  Neurological: Negative.  Negative for dizziness, weakness, light-headedness and headaches.  Hematological: Negative for adenopathy. Does not bruise/bleed easily.  Psychiatric/Behavioral: Negative.     Objective:  BP 138/84 (BP Location: Left Arm, Patient Position: Sitting, Cuff Size: Normal)    Pulse 74    Temp 98.4 F (36.9 C) (Oral)    Resp 16    Ht 5\' 6"  (1.676 m)    Wt 161 lb (73 kg)    SpO2 97%    BMI 25.99 kg/m   BP Readings from Last 3 Encounters:  08/01/19 138/84  04/24/19 136/79  04/13/19 137/80    Wt Readings from Last 3 Encounters:  08/01/19 161 lb (73 kg)  04/24/19 156 lb 9.6 oz (71 kg)  04/13/19 156 lb 12.8 oz (71.1 kg)    Physical Exam Vitals signs reviewed.  Constitutional:      Appearance: Normal appearance. She is not ill-appearing or diaphoretic.  HENT:     Nose: Nose normal.     Mouth/Throat:     Mouth: Mucous membranes are moist.  Eyes:     General: No scleral icterus.    Conjunctiva/sclera: Conjunctivae normal.  Neck:     Musculoskeletal: Neck supple.  Cardiovascular:     Rate and Rhythm: Normal rate and regular rhythm.     Heart sounds: Normal heart sounds, S1 normal and S2  normal. No murmur.     Comments: EKG ---  Sinus  Rhythm  WITHIN NORMAL LIMITS Pulmonary:     Breath sounds: No stridor. No wheezing, rhonchi or rales.  Abdominal:     General: Abdomen is flat.     Palpations: There is no mass.     Tenderness: There is no abdominal tenderness. There is no guarding.  Musculoskeletal:     Right lower leg: No edema.     Left lower leg: No edema.  Lymphadenopathy:     Cervical: No cervical adenopathy.  Neurological:     General: No focal deficit present.     Mental Status: She is alert.  Psychiatric:        Mood and Affect: Mood normal.         Behavior: Behavior normal.     Lab Results  Component Value Date   WBC 5.6 08/01/2019   HGB 13.4 08/01/2019   HCT 40.5 08/01/2019   PLT 366.0 08/01/2019   GLUCOSE 113 (H) 08/01/2019   CHOL 239 (H) 04/05/2019   TRIG 105.0 04/05/2019   HDL 55.50 04/05/2019   LDLDIRECT 150.8 10/19/2011   LDLCALC 163 (H) 04/05/2019   ALT 25 12/20/2018   AST 16 12/20/2018   NA 138 08/01/2019   K 3.7 08/01/2019   CL 101 08/01/2019   CREATININE 0.72 08/01/2019   BUN 14 08/01/2019   CO2 31 08/01/2019   TSH 0.84 08/01/2019   HGBA1C 6.7 (H) 08/01/2019   MICROALBUR 0.9 12/20/2018    Ct Chest Lung Ca Screen Low Dose W/o Cm  Result Date: 04/24/2019 CLINICAL DATA:  Current smoker, 30 pack-year history. EXAM: CT CHEST WITHOUT CONTRAST LOW-DOSE FOR LUNG CANCER SCREENING TECHNIQUE: Multidetector CT imaging of the chest was performed following the standard protocol without IV contrast. COMPARISON:  01/07/2018. FINDINGS: Cardiovascular: Atherosclerotic calcification of the aorta. Heart size normal. No pericardial effusion. Mediastinum/Nodes: No pathologically enlarged mediastinal or axillary lymph nodes. Hilar regions are difficult to evaluate without IV contrast. Esophagus is grossly unremarkable. Lungs/Pleura: Mild centrilobular and paraseptal emphysema. Mild basilar scarring/atelectasis. No worrisome pulmonary nodules. No pleural fluid. Airway is unremarkable. Upper Abdomen: Visualized portion of the liver is unremarkable. Fluid density mass in the right adrenal gland measures 2.8 cm, as before. There may be nodular thickening of the medial limb left adrenal gland, partially imaged. Visualized portions of the kidneys, spleen, pancreas, stomach and bowel are otherwise grossly unremarkable. Musculoskeletal: Degenerative changes in the spine. No worrisome lytic or sclerotic lesions. IMPRESSION: 1. Lung-RADS 1, negative. Continue annual screening with low-dose chest CT without contrast in 12 months. 2. Right adrenal  adenoma. 3.  Aortic atherosclerosis (ICD10-170.0). 4.  Emphysema (ICD10-J43.9). Electronically Signed   By: Lorin Picket M.D.   On: 04/24/2019 08:12    Assessment & Plan:   Lauren Orozco was seen today for hypertension.  Diagnoses and all orders for this visit:  Essential hypertension- Her blood pressure is adequately well controlled. -     Basic metabolic panel; Future -     Magnesium; Future -     TSH; Future  Thrombocytosis ([place])- Her platelet count is normal now. -     CBC with Differential/Platelet; Future  Type II diabetes mellitus with manifestations ([place])- Her blood sugar is adequately well controlled. -     Hemoglobin A1c; Future  Rapid palpitations- Her description of the palpitations sounds benign.  Possibly related to stress and anxiety.  Her EKG today is normal and her labs are negative for secondary causes.  I have asked her to undergo a 48-hour Holter monitor to be certain that she is not experiencing SVT or A. fib/flutter, -     EKG 12-Lead -     Basic metabolic panel; Future -     Magnesium; Future -     TSH; Future -     Cancel: Holter monitor - 48 hour; Future -     Holter monitor - 48 hour; Future  Hyperlipidemia with target LDL less than 130- She has not recently been taking the statin.  I have asked her to restart rosuvastatin for cardiovascular risk reduction. -     rosuvastatin (CRESTOR) 20 MG tablet; Take 1 tablet (20 mg total) by mouth daily. -     TSH; Future   I have discontinued Lauren Orozco. Lauren Orozco's oxyCODONE. I am also having her maintain her gabapentin, Cholecalciferol, indapamide, and rosuvastatin.  Meds ordered this encounter  Medications   rosuvastatin (CRESTOR) 20 MG tablet    Sig: Take 1 tablet (20 mg total) by mouth daily.    Dispense:  90 tablet    Refill:  1     Follow-up: Return in about 3 months (around 11/01/2019).  Scarlette Calico, MD

## 2019-08-03 LAB — TSH: TSH: 0.84 u[IU]/mL (ref 0.35–4.50)

## 2019-08-04 ENCOUNTER — Encounter: Payer: Self-pay | Admitting: Internal Medicine

## 2019-09-09 ENCOUNTER — Telehealth: Payer: Self-pay | Admitting: Family Medicine

## 2019-09-09 NOTE — Telephone Encounter (Signed)
On call message received. Patient with questionable covid symptoms of low grade fever, achy, sore throat, and cough. No known covid positive exposure. Does live in house with people who work in public. She has no shortness of breath. Also (per nurse) stated legs feel really heavy and last time this happened when she was on a statin. Is on a statin now.   -advised urgent care for testing for covid/eval and to stop statin and f/u with pcp this next week regarding medication.   Also wanted to know if she can take tussin dm with tylenol. -discussed with nurse, no contraindication for this.   Orma Flaming, MD [place]

## 2019-09-11 ENCOUNTER — Telehealth: Payer: Self-pay | Admitting: Internal Medicine

## 2019-09-11 ENCOUNTER — Telehealth: Payer: Self-pay | Admitting: *Deleted

## 2019-09-11 NOTE — Telephone Encounter (Signed)
3 day ZIO XT long term holter monitor to be mailed to the patients home.  Instructions reviewed briefly as the are included in the monitor kit. 

## 2019-09-11 NOTE — Telephone Encounter (Signed)
LVM for pt to call back for advise.

## 2019-09-11 NOTE — Telephone Encounter (Signed)
Patient spoke with [facility] 09/09/19 at 4:06pm.   Stating that she had a runny nose, sore throat, headache and a temp of 99.  States walking was difficult with heaviness in legs.  Patient states that she took hydrocodone, robitussin, diabetic cough syrup and had had no relief.  She believes difficulty walking is due to cholesterol meds.  Does not think she had been exposed to [place].  States no one else in her house has COVID.  States that the heaviness in legs started Thursday and the cold symptoms started Friday.   Doctor on call recommended that pt stop taking her statin medication and if she is still having difficulty walking then to go to the ER.  Stated for her to call the office on Monday.Also, recommended for patient to go to urgent care and to get tested for COVID.  States it was ok for patient to take tussin and tylenol. I spoke with patient this morning.  She stated that she only wanted to see her provider.  Please advise.

## 2019-09-12 ENCOUNTER — Encounter (HOSPITAL_COMMUNITY): Payer: Self-pay

## 2019-09-12 ENCOUNTER — Ambulatory Visit (HOSPITAL_COMMUNITY)
Admission: EM | Admit: 2019-09-12 | Discharge: 2019-09-12 | Disposition: A | Discharge status: Home / Self Care | Payer: Medicare Other | Attending: Emergency Medicine | Admitting: Emergency Medicine

## 2019-09-12 ENCOUNTER — Other Ambulatory Visit: Payer: Self-pay

## 2019-09-12 DIAGNOSIS — I10 Essential (primary) hypertension: Secondary | ICD-10-CM | POA: Insufficient documentation

## 2019-09-12 DIAGNOSIS — Z881 Allergy status to other antibiotic agents status: Secondary | ICD-10-CM | POA: Insufficient documentation

## 2019-09-12 DIAGNOSIS — E785 Hyperlipidemia, unspecified: Secondary | ICD-10-CM | POA: Insufficient documentation

## 2019-09-12 DIAGNOSIS — F17210 Nicotine dependence, cigarettes, uncomplicated: Secondary | ICD-10-CM | POA: Insufficient documentation

## 2019-09-12 DIAGNOSIS — R0981 Nasal congestion: Secondary | ICD-10-CM | POA: Diagnosis present

## 2019-09-12 DIAGNOSIS — Z8572 Personal history of non-Hodgkin lymphomas: Secondary | ICD-10-CM | POA: Insufficient documentation

## 2019-09-12 DIAGNOSIS — J029 Acute pharyngitis, unspecified: Secondary | ICD-10-CM | POA: Diagnosis not present

## 2019-09-12 DIAGNOSIS — M79602 Pain in left arm: Secondary | ICD-10-CM | POA: Insufficient documentation

## 2019-09-12 DIAGNOSIS — E119 Type 2 diabetes mellitus without complications: Secondary | ICD-10-CM | POA: Insufficient documentation

## 2019-09-12 DIAGNOSIS — Z79899 Other long term (current) drug therapy: Secondary | ICD-10-CM | POA: Insufficient documentation

## 2019-09-12 DIAGNOSIS — Z20828 Contact with and (suspected) exposure to other viral communicable diseases: Secondary | ICD-10-CM | POA: Insufficient documentation

## 2019-09-12 DIAGNOSIS — J069 Acute upper respiratory infection, unspecified: Secondary | ICD-10-CM | POA: Diagnosis not present

## 2019-09-12 DIAGNOSIS — R05 Cough: Secondary | ICD-10-CM

## 2019-09-12 MED ORDER — FLUTICASONE PROPIONATE 50 MCG/ACT NA SUSP: 2.0000 | Freq: Every day | NASAL | 0 refills | Status: DC

## 2019-09-12 MED ORDER — BENZONATATE 200 MG PO CAPS: 200.0000 mg | ORAL_CAPSULE | Freq: Three times a day (TID) | ORAL | 0 refills | Status: DC | PRN

## 2019-09-12 NOTE — ED Provider Notes (Signed)
HPI  SUBJECTIVE:  Lauren Orozco is a 63 y.o. female who presents with 5 days of "URI" symptoms.  She reports thick yellow nasal congestion, body aches, frontal headaches, sore throat, nonproductive cough, postnasal drip.  No fevers above 100.4, maxillary sinus pain or pressure, loss of sense of smell or taste.  No wheezing, chest pain, shortness of breath, nausea, vomiting, diarrhea, abdominal pain.  No facial swelling, upper dental pain.  No voice changes.  No sensation of throat swelling shut, difficulty breathing, drooling, trismus, neck stiffness.  No known exposure to Covid.  No antibiotics in the past month.  No antipyretic in the past 4 to 6 hours.  No double sickening.  States that she is able to sleep at night without waking up and coughing.  She has been taking Tylenol, Tessalon, hot tea and lemon with improvement in her symptoms.  No aggravating factors.  Second, she reports left diffuse arm pain for the past 4 to 5 days.  States is constant, throbbing, stabbing.  Denies shoulder, elbow, wrist pain.  No limitation of motion, grip weakness, arm weakness.  No chest pain, neck pain.  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  She has been doing heat and Tylenol with improvement in her symptoms, symptoms worse with going out to the cold weather, making a fist.  She is a smoker.  She has a history of diabetes, hypertension, non-Hodgkin's lymphoma, which she states has been completely cleared.  No current chemo, immunocompromise, HIV, coronary disease, chronic kidney disease.  [id number], Arvid Right, MD   Past Medical History:  Diagnosis Date  . Anxiety   . Chronic neck pain    with radiculopathy  . Chronic pain of left wrist 2009   "since neck surgery"  . Complication of anesthesia    Hard to wake up per pt; after neck sx had to be bagged.  Marland Kitchen DEPRESSION   . DISC DISEASE, CERVICAL   . Diverticulitis   . HYPERLIPIDEMIA   . Hypertension   .  Lymphocytosis 01/02/2016  . NEPHROLITHIASIS, HX OF   . NHL (non-Hodgkin's lymphoma) ([place]) 12/25/2014  . Recurrent abdominal pain   . Umbilical hernia   . UTI'S, CHRONIC     Past Surgical History:  Procedure Laterality Date  . ABDOMINAL HYSTERECTOMY  2001  . APPENDECTOMY  1981  . KIDNEY STONE SURGERY  2007  . LEFT OOPHORECTOMY  2001  . MYOMECTOMY  1981  . NECK SURGERY  2009   fusion of C1, C2, C3    Family History  Problem Relation Age of Onset  . Hypertension Mother   . Diabetes Mother   . Hyperlipidemia Mother   . Heart attack Father        MI  . Hyperlipidemia Father   . Hypertension Father   . Diabetes Father   . Heart disease Father   . Cancer Neg Hx        Negative for Breast or Colon Cancer  . Colon cancer Neg Hx     Social History   Tobacco Use  . Smoking status: Former Smoker    Packs/day: 0.25    Years: 30.00    Pack years: 7.50    Types: Cigarettes  . Smokeless tobacco: Never Used  . Tobacco comment: stopped around march   Substance Use Topics  . Alcohol use: No  . Drug use: No    No current facility-administered medications for this encounter.   Current Outpatient Medications:  .  benzonatate (TESSALON) 200 MG capsule, Take 1 capsule (200 mg total) by mouth 3 (three) times daily as needed for cough., Disp: 30 capsule, Rfl: 0 .  Cholecalciferol 50 MCG (2000 UT) TABS, Take 2 tablets (4,000 Units total) by mouth daily., Disp: 180 tablet, Rfl: 1 .  fluticasone (FLONASE) 50 MCG/ACT nasal spray, Place 2 sprays into both nostrils daily., Disp: 16 g, Rfl: 0 .  gabapentin (NEURONTIN) 100 MG capsule, Take 2 capsules (200 mg total) by mouth at bedtime., Disp: 60 capsule, Rfl: 3 .  indapamide (LOZOL) 1.25 MG tablet, Take 1 tablet (1.25 mg total) by mouth daily., Disp: 90 tablet, Rfl: 0  Allergies  Allergen Reactions  . Ciprofloxacin Hives    All floxacin drugs Burn-like hives  . Ofloxacin Hives    Burn like hives  . Other Other (See Comments)    All  NUTS and corn due to diverticulitis     ROS  As noted in HPI.   Physical Exam  BP (!) 157/89 (BP Location: Right Arm)   Pulse 68   Temp 97.9 F (36.6 C) (Oral)   Resp 16   SpO2 97%   Constitutional: Well developed, well nourished, no acute distress Eyes:  EOMI, conjunctiva normal bilaterally HENT: Normocephalic, atraumatic,mucus membranes moist.  Normal turbinates.  No apparent nasal congestion.  No maxillary, frontal sinus tenderness.  Normal tonsils without exudates.  Uvula midline.  Positive postnasal drip. Neck: No cervical lymphadenopathy Respiratory: Normal inspiratory effort, lungs clear bilaterally, no chest wall tenderness Cardiovascular: Normal rate regular rhythm no murmurs rubs or gallops GI: nondistended skin: No rash, skin intact Musculoskeletal: no deformities.  No tenderness over the shoulder, elbow, wrist.  She has tenderness over the bicep and radial aspect of the forearm.  No limitation of motion, grip strength 5/5, she is neurovascularly intact in the median/radial/ulnar distribution.  RP 2+. Neurologic: Alert & oriented x 3, no focal neuro deficits Psychiatric: Speech and behavior appropriate   ED Course   Medications - No data to display  Orders Placed This Encounter  Procedures  . Novel Coronavirus, NAA (Hosp order, Send-out to Ref Lab; TAT 18-24 hrs    Standing Status:   Standing    Number of Occurrences:   1    Order Specific Question:   Is this test for diagnosis or screening    Answer:   Screening    Order Specific Question:   Symptomatic for COVID-19 as defined by CDC    Answer:   No    Order Specific Question:   Hospitalized for COVID-19    Answer:   No    Order Specific Question:   Admitted to ICU for COVID-19    Answer:   No    Order Specific Question:   Previously tested for COVID-19    Answer:   Yes    Order Specific Question:   Resident in a congregate (group) care setting    Answer:   No    Order Specific Question:   Employed in  healthcare setting    Answer:   No    Order Specific Question:   Pregnant    Answer:   No    No results found for this or any previous visit (from the past 24 hour(s)). No results found.  ED Clinical Impression  1. Upper respiratory tract infection, unspecified type   2. Left arm pain      ED Assessment/Plan  1.  URI.  No evidence of sinusitis yet.  Covid  PCR sent.  Tessalon, saline nasal irrigation, Flonase, Mucinex, ibuprofen 400 mg of the thousand with Tylenol 3-4 times a day for the next 5 days or so.  May return here or see her doctor in 5 days for antibiotics for sinus infection if not getting better by then, return earlier for double sickening.  BUN/creatinine 08/01/2019 normal.  2.  Left arm pain.  Appears to be musculoskeletal.  She has no tenderness over the joints.  She is tender over the lateral forearm and the bicep only.  She is otherwise neurovascularly intact.  States that she will try the ibuprofen/Tylenol, and will follow-up with PMD if not getting any better.  Discussed MDM, treatment plan, and plan for follow-up with patient.patient agrees with plan.   Meds ordered this encounter  Medications  . fluticasone (FLONASE) 50 MCG/ACT nasal spray    Sig: Place 2 sprays into both nostrils daily.    Dispense:  16 g    Refill:  0  . benzonatate (TESSALON) 200 MG capsule    Sig: Take 1 capsule (200 mg total) by mouth 3 (three) times daily as needed for cough.    Dispense:  30 capsule    Refill:  0    *This clinic note was created using [employer]. Therefore, there may be occasional mistakes despite careful proofreading.   ?    Melynda Ripple, MD 09/12/19 1425

## 2019-09-12 NOTE — ED Triage Notes (Signed)
Pt states having headache, body aches and cough x 4 days. Pt states having pain in her left arm, worsened with movement.

## 2019-09-12 NOTE — Telephone Encounter (Signed)
Pt informed that we were not able to see her with her current symptom. Patient stated that it is always a problem when she wants to come in and see her provider. Pt stated that she will go the UC as directed and they can become her doctor. She stated that "as a black woman" I can't get in to see her doctor when she needs him. Pt also stated that she is concerned that she was taken off of her cholesterol medication and is concerned about that.   I explained that it was dc'ed temporary to see if it helps that leg heaviness that she was experiencing. Pt stated that since she stopped the rosuvastatin that her legs are feeling better but she is still having the body aches.   I apologized to pt because she was unhappy about not being able to come in and see Dr. Ronnald Ramp. I stated that we try to take care of all patients to get them in and seen as soon as possible. Pt was interested in my apologizes and said that [facility] needs more training on how to take care of their patients.

## 2019-09-12 NOTE — Discharge Instructions (Addendum)
Mucinex, Flonase for the nasal congestion and postnasal drip.  Saline nasal irrigation with a Milta Deiters med rinse and distilled water as often as you want to help prevent a bacterial sinus infection.  Mucinex to keep things thin so that you can rinse and blow that out.  Your Covid test will take 18 to 36 hours for 2 result.  We will call you if it comes back positive.  It is fine to take 400 mg ibuprofen combined with 1 g of Tylenol 3-4 times a day for for 5 days.  I think it will help with the arm as well.  Follow-up with your doctor if your arm pain does not get any better.  Return here if your sinuses get worse or you are if you are still sick after 5 days.

## 2019-09-14 LAB — NOVEL CORONAVIRUS, NAA (HOSP ORDER, SEND-OUT TO REF LAB; TAT 18-24 HRS): SARS-CoV-2, NAA: NOT DETECTED

## 2019-09-22 ENCOUNTER — Ambulatory Visit (INDEPENDENT_AMBULATORY_CARE_PROVIDER_SITE_OTHER): Payer: Medicare Other

## 2019-09-22 DIAGNOSIS — R002 Palpitations: Secondary | ICD-10-CM | POA: Diagnosis not present

## 2019-10-11 ENCOUNTER — Other Ambulatory Visit: Payer: Self-pay | Admitting: Internal Medicine

## 2019-10-11 DIAGNOSIS — I10 Essential (primary) hypertension: Secondary | ICD-10-CM

## 2019-10-11 MED ORDER — INDAPAMIDE 1.25 MG PO TABS: 1.2500 mg | ORAL_TABLET | Freq: Every day | ORAL | 0 refills | Status: DC

## 2019-10-20 ENCOUNTER — Encounter: Payer: Self-pay | Admitting: Internal Medicine

## 2019-10-20 DIAGNOSIS — R0789 Other chest pain: Secondary | ICD-10-CM | POA: Diagnosis not present

## 2019-10-20 DIAGNOSIS — R079 Chest pain, unspecified: Secondary | ICD-10-CM | POA: Diagnosis not present

## 2019-10-20 DIAGNOSIS — I10 Essential (primary) hypertension: Secondary | ICD-10-CM | POA: Diagnosis not present

## 2019-10-24 DIAGNOSIS — R002 Palpitations: Secondary | ICD-10-CM | POA: Diagnosis not present

## 2019-10-25 ENCOUNTER — Encounter: Payer: Self-pay | Admitting: Internal Medicine

## 2019-11-26 ENCOUNTER — Ambulatory Visit: Payer: Medicare Other | Attending: Internal Medicine

## 2019-11-26 DIAGNOSIS — Z23 Encounter for immunization: Secondary | ICD-10-CM | POA: Insufficient documentation

## 2019-11-26 NOTE — Progress Notes (Signed)
   Covid-19 Vaccination Clinic  Name:  Naola Finnigan    MRN: [id number] DOB: Jan 08, 1956  11/26/2019  Ms. Kirley was observed post Covid-19 immunization for 15 minutes without incidence. She was provided with Vaccine Information Sheet and instruction to access the V-Safe system.   Ms. Bernal was instructed to call 911 with any severe reactions post vaccine: Marland Kitchen Difficulty breathing  . Swelling of your face and throat  . A fast heartbeat  . A bad rash all over your body  . Dizziness and weakness    Immunizations Administered    Name Date Dose VIS Date Route   Pfizer COVID-19 Vaccine 11/26/2019 11:53 AM 0.3 mL 09/15/2019 Intramuscular   Manufacturer: [place]   Lot: [id number]   [place]: [id number]

## 2019-12-12 ENCOUNTER — Telehealth: Payer: Self-pay

## 2019-12-12 NOTE — Telephone Encounter (Signed)
Pt states she normally take her Crestor at night and accidentally took another one this morning instead of her BP medication and was concerned and wanted to know if she should skip tomorrows dose. I informed pt that she should be perfectly fine just to not take it tonight and restart it tomorrow night as usual.  Lauren Orozco

## 2019-12-12 NOTE — Telephone Encounter (Signed)
New message    The patient calls have question /concern regarding medication Crestor 20 mg will discuss further when the nurse called back.

## 2019-12-19 ENCOUNTER — Telehealth: Payer: Self-pay | Admitting: Internal Medicine

## 2019-12-19 DIAGNOSIS — I10 Essential (primary) hypertension: Secondary | ICD-10-CM

## 2019-12-19 DIAGNOSIS — M5030 Other cervical disc degeneration, unspecified cervical region: Secondary | ICD-10-CM

## 2019-12-19 NOTE — Progress Notes (Signed)
  Chronic Care Management   Note  12/19/2019 Name: Lauren Orozco MRN: [id number] DOB: [birth year]  Lauren Orozco is a 64 y.o. year old female who is a primary care patient of Janith Lima, MD. I reached out to [place] by phone today in response to a referral sent by [name]'s PCP, Janith Lima, MD.   [name] was given information about Chronic Care Management services today including:  1. CCM service includes personalized support from designated clinical staff supervised by her physician, including individualized plan of care and coordination with other care providers 2. 24/7 contact phone numbers for assistance for urgent and routine care needs. 3. Service will only be billed when office clinical staff spend 20 minutes or more in a month to coordinate care. 4. Only one practitioner may furnish and bill the service in a calendar month. 5. The patient may stop CCM services at any time (effective at the end of the month) by phone call to the office staff.   Patient agreed to services and verbal consent obtained.   Follow up plan:   Raynicia Dukes [employer] Scheduler

## 2019-12-20 ENCOUNTER — Ambulatory Visit: Payer: Medicare Other | Attending: Internal Medicine

## 2019-12-20 DIAGNOSIS — Z23 Encounter for immunization: Secondary | ICD-10-CM

## 2019-12-20 NOTE — Progress Notes (Signed)
   Covid-19 Vaccination Clinic  Name:  Lashanae Turlington    MRN: [id number] DOB: 02-22-1956  12/20/2019  Ms. Palacios was observed post Covid-19 immunization for 15 minutes without incident. She was provided with Vaccine Information Sheet and instruction to access the V-Safe system.   Ms. Saar was instructed to call 911 with any severe reactions post vaccine: Marland Kitchen Difficulty breathing  . Swelling of face and throat  . A fast heartbeat  . A bad rash all over body  . Dizziness and weakness   Immunizations Administered    Name Date Dose VIS Date Route   Pfizer COVID-19 Vaccine 12/20/2019 10:44 AM 0.3 mL 09/15/2019 Intramuscular   Manufacturer: [place]   Lot: [id number]   [place]: [id number]

## 2019-12-29 NOTE — Addendum Note (Signed)
Addended by: Karle Barr on: 12/29/2019 03:44 PM   Modules accepted: Orders

## 2020-01-04 NOTE — Chronic Care Management (AMB) (Signed)
Chronic Care Management Pharmacy  Name: Lauren Orozco  MRN: [id number] DOB: 08/02/1956  Chief Complaint/ HPI  [place],  64 y.o. , female presents for their Initial CCM visit with the clinical pharmacist via telephone due to COVID-19 Pandemic.  PCP : Janith Lima, MD  Their chronic conditions include: HTN, T2DM, HLD, tobacco use, cervical disc disease  Lived here whole life, 64 of 2 kids, took care of 2 sick parents. Neck fused 2009, had to stop working, caused a lot of pain.  Office Visits: 08/01/19 Dr Ronnald Ramp OV: c/o palpitations. EKG normal. Ordered 48-hr Holter monitor. Pt was not taking statin, asked to restart rosuvastatin.   Consult Visit: 09/12/19 ED visit: URI and arm pain. COVID negative. Rx'd flonase, benzonatate. Rec'd Tylenol/ibuprofen for arm pain.   Medications: Outpatient Encounter Medications as of 01/05/2020  Medication Sig Note  . acetaminophen (TYLENOL) 500 MG tablet Take 500 mg by mouth every 6 (six) hours as needed.   . Cholecalciferol 50 MCG (2000 UT) TABS Take 2 tablets (4,000 Units total) by mouth daily.   . indapamide (LOZOL) 1.25 MG tablet Take 1 tablet (1.25 mg total) by mouth daily.   . rosuvastatin (CRESTOR) 20 MG tablet Take 20 mg by mouth daily.   . benzonatate (TESSALON) 200 MG capsule Take 1 capsule (200 mg total) by mouth 3 (three) times daily as needed for cough.   . fluticasone (FLONASE) 50 MCG/ACT nasal spray Place 2 sprays into both nostrils daily. (Patient not taking: Reported on 01/05/2020)   . gabapentin (NEURONTIN) 100 MG capsule Take 2 capsules (200 mg total) by mouth at bedtime. (Patient not taking: Reported on 01/05/2020)   . [DISCONTINUED] rosuvastatin (CRESTOR) 20 MG tablet Take 1 tablet (20 mg total) by mouth daily. 09/12/2019: d/cd by PMD recently   No facility-administered encounter medications on file as of 01/05/2020.     Current Diagnosis/Assessment:  SDOH Interventions     Most Recent Value  SDOH  Interventions  SDOH Interventions for the Following Domains  Housing  Housing Interventions  Other (Comment) [adequate and secure housing]      Goals Addressed            This Visit's Progress   . Blood Pressure < 130/80       CARE PLAN ENTRY (see longtitudinal plan of care for additional care plan information)  Current Barriers:  . Current antihypertensive regimen: indapamide 1.25 mg daily . Previous antihypertensives tried: chlorthalidone, telmisartan, Edarby . Last practice recorded BP readings:  BP Readings from Last 3 Encounters:  09/12/19 (!) 157/89  08/01/19 138/84  04/24/19 136/79   . Current home BP readings: SBP 120s-130s . Most recent eGFR/CrCl:  Lab Results  Component Value Date   EGFR >90 01/02/2016    Pharmacist Clinical Goal(s):  Marland Kitchen Over the next 90 days, patient will work with PharmD and providers to optimize antihypertensive regimen  Interventions: . Comprehensive medication review performed; medication list updated in the electronic medical record.  . Continue indapamide 1.25 mg daily  . Continue checking BP multiple times per week and when feeling lightheaded . Recommended to increase water intake to prevent light-headedness  Patient Self Care Activities:  . Patient will continue to check BP as directed, document, and provide at future appointments . Patient will focus on medication adherence by pill box/pill pack  Initial goal documentation     . Cholesterol: LDL < 100       CARE PLAN ENTRY (see longtitudinal plan of care  for additional care plan information)  Current Barriers:  . Uncontrolled hyperlipidemia, complicated by elevated LDL . Current antihyperlipidemic regimen: rosuvastatin 20 mg at night . Previous antihyperlipidemic medications tried: atorvastatin, lovastatin . Most recent lipid panel:     Component Value Date/Time   CHOL 239 (H) 04/05/2019 1130   TRIG 105.0 04/05/2019 1130   HDL 55.50 04/05/2019 1130   CHOLHDL 4  04/05/2019 1130   VLDL 21.0 04/05/2019 1130   LDLCALC 163 (H) 04/05/2019 1130  The 10-year ASCVD risk score Mikey Bussing DC Jr., et al., 2013) is: 33.8%   Pharmacist Clinical Goal(s):  Marland Kitchen Over the next 90 days, patient will work with PharmD and providers towards optimized antihyperlipidemic therapy  Interventions: . Comprehensive medication review performed; medication list updated in electronic medical record.  . Discussed importance of diet for cholesterol improvement (see attached list of cholesterol content of foods)  Patient Self Care Activities:  . Patient will focus on medication adherence by pill box/pill pack  Initial goal documentation     . Pharmacy Care Plan       CARE PLAN ENTRY  Current Barriers:  . Chronic Disease Management support, education, and care coordination needs related to HTN, HLD, and DMII  Pharmacist Clinical Goal(s):  . Ensure safety, efficacy, and affordability of medications . Improve ease of medication administration  Interventions: . Utilize [facility] for medication synchronization, packaging and delivery  Patient Self Care Activities:  . Patient verbalizes understanding of plan to switch to [facility] pharmacy, Self administers medications as prescribed, Calls pharmacy for medication refills, and Calls provider office for new concerns or questions  Initial goal documentation    . Prediabetes: A1c < 6.5%       CARE PLAN ENTRY (see longtitudinal plan of care for additional care plan information)  Current Barriers:  . Diabetes: uncontrolled Lab Results  Component Value Date   HGBA1C 6.7 (H) 08/01/2019 .   Lab Results  Component Value Date   CREATININE 0.72 08/01/2019   CREATININE 0.86 04/10/2019   CREATININE 0.79 04/05/2019 .   Lab Results  Component Value Date   EGFR >90 01/02/2016   . Current antihyperglycemic regimen: NONE . Current exercise: when able to, limited by pain  Pharmacist Clinical Goal(s):  Marland Kitchen Over the next 90  days, patient will work with PharmD and primary care provider to address diet changes  Interventions: . Comprehensive medication review performed, medication list updated in electronic medical record . Recommended to reduce carbohydrate (starch) intake and maximize vegetables and protein ("plate method")  Patient Self Care Activities:  . Patient will focus on diet changes to prevent diabetes progression  Initial goal documentation        Hypertension   Office blood pressures are  BP Readings from Last 3 Encounters:  09/12/19 (!) 157/89  08/01/19 138/84  04/24/19 136/79   Lab Results  Component Value Date   CREATININE 0.72 08/01/2019   CREATININE 0.86 04/10/2019   CREATININE 0.79 04/05/2019    Patient has failed these meds in the past: chlorthalidone, azilsartan, telmisartan Patient is currently controlled on the following medications: indapamide 1.25 mg daily AM  Patient checks BP at home 3-5x per week  Patient home BP readings are ranging: 120s-130s/  We discussed diet and exercise extensively. Previous meds had pushed BP too low. Pt reports she feels "in a fog" later in the day, some lightheadedness, when she checks BP at these times it is normal (SBP 120s-130s). She says this feeling happens randomly, not just after  standing or sitting up. She does report she does not drink a lot during the day. Discussed importance of adequate PO intake to maintain homeostasis and equilibrium.   Plan  Continue current medications and control with diet and exercise  Recommended to increase daily water intake   Hyperlipidemia   Lipid Panel     Component Value Date/Time   CHOL 239 (H) 04/05/2019 1130   TRIG 105.0 04/05/2019 1130   HDL 55.50 04/05/2019 1130   CHOLHDL 4 04/05/2019 1130   VLDL 21.0 04/05/2019 1130   LDLCALC 163 (H) 04/05/2019 1130    The 10-year ASCVD risk score Mikey Bussing DC Jr., et al., 2013) is: 33.8%   Values used to calculate the score:     Age: 10 years      Sex: Female     Is Non-Hispanic African American: Yes     Diabetic: Yes     Tobacco smoker: No     Systolic Blood Pressure: 209 mmHg     Is BP treated: Yes     HDL Cholesterol: 55.5 mg/dL     Total Cholesterol: 239 mg/dL   Patient has failed these meds in past: atorvastatin, lovastatin (no statin allergy listed) Patient is currently controlled on the following medications: rosuvastatin 20 mg HS  We discussed:  diet and exercise extensively , types of cholesterol, role of cholesterol in ASCVD, benefits of statin. Pt denies issues with statin, she is taking it nightly.  Plan  Continue current medications and control with diet and exercise    Diabetes   Recent Relevant Labs: Lab Results  Component Value Date/Time   HGBA1C 6.7 (H) 08/01/2019 11:18 AM   HGBA1C 6.5 04/05/2019 11:30 AM   EGFR >90 01/02/2016 10:34 AM   EGFR >90 12/18/2014 11:12 AM   MICROALBUR 0.9 12/20/2018 11:38 AM    No medications diabetes.  We discussed: diet and exercise extensively, plate method.  Plan  Continue control with diet and exercise   Disc disease/arthritis   Patient has failed these meds in past: oxycodone, gabapentin Patient is currently controlled on the following medications: Tylenol Extra strength 1 daily  We discussed:  Max daily dose of Tylenol, she is currently only taking 500 mg/day as needed, recommended to increase Tylenol up to 3g/day if needed for pain control. Pt reports she is no longer taking gapapentin, she did not like the way it made her feel.   Plan  Continue current medications    Health Maintenance   Patient is currently controlled on the following medications: Vitamin D 2000 IU  We discussed:  Patient is satisfied with current OTC regimen and denies issues. She is worried about magnesium and potassium levels, last checked in 07/2019, K was 3.7 and Mg was 1.8. Recommended to start multivitamin.  Plan  Continue current medications Recommended  multivitamin  Medication Management   Pt uses [facility] for all medications Uses pill box Pt endorses 100% compliance  We discussed: Verbal consent obtained for [facility] enhanced pharmacy services (medication synchronization, adherence packaging, delivery coordination). A medication sync plan was created to allow patient to get all medications delivered once every 30 to 90 days per patient preference. Patient understands they have freedom to choose pharmacy and clinical pharmacist will coordinate care between all prescribers and [facility].  Plan  Utilize [facility] for medication synchronization, packaging and delivery     Follow up: 3 month phone visit  Charlene Brooke, PharmD Clinical Pharmacist [facility] [phone]

## 2020-01-05 ENCOUNTER — Ambulatory Visit: Payer: Medicare Other | Admitting: Pharmacist

## 2020-01-05 ENCOUNTER — Other Ambulatory Visit: Payer: Self-pay

## 2020-01-05 DIAGNOSIS — E785 Hyperlipidemia, unspecified: Secondary | ICD-10-CM

## 2020-01-05 DIAGNOSIS — I10 Essential (primary) hypertension: Secondary | ICD-10-CM

## 2020-01-05 DIAGNOSIS — E119 Type 2 diabetes mellitus without complications: Secondary | ICD-10-CM

## 2020-01-05 NOTE — Patient Instructions (Signed)
Visit Information  Thank you for meeting with me to discuss your medications! I look forward to working with you to achieve your health care goals. Below is a summary of what we talked about during the visit:  Goals Addressed            This Visit's Progress   . Blood Pressure < 130/80       CARE PLAN ENTRY (see longtitudinal plan of care for additional care plan information)  Current Barriers:  . Current antihypertensive regimen: indapamide 1.25 mg daily . Previous antihypertensives tried: chlorthalidone, telmisartan, Edarby . Last practice recorded BP readings:  BP Readings from Last 3 Encounters:  09/12/19 (!) 157/89  08/01/19 138/84  04/24/19 136/79   . Current home BP readings: SBP 120s-130s . Most recent eGFR/CrCl:  Lab Results  Component Value Date   EGFR >90 01/02/2016    Pharmacist Clinical Goal(s):  Marland Kitchen Over the next 90 days, patient will work with PharmD and providers to optimize antihypertensive regimen  Interventions: . Comprehensive medication review performed; medication list updated in the electronic medical record.  . Continue indapamide 1.25 mg daily  . Continue checking BP multiple times per week and when feeling lightheaded . Recommended to increase water intake to prevent light-headedness  Patient Self Care Activities:  . Patient will continue to check BP as directed, document, and provide at future appointments . Patient will focus on medication adherence by pill box/pill pack  Initial goal documentation     . Cholesterol: LDL < 100       CARE PLAN ENTRY (see longtitudinal plan of care for additional care plan information)  Current Barriers:  . Uncontrolled hyperlipidemia, complicated by elevated LDL . Current antihyperlipidemic regimen: rosuvastatin 20 mg at night . Previous antihyperlipidemic medications tried: atorvastatin, lovastatin . Most recent lipid panel:     Component Value Date/Time   CHOL 239 (H) 04/05/2019 1130   TRIG 105.0  04/05/2019 1130   HDL 55.50 04/05/2019 1130   CHOLHDL 4 04/05/2019 1130   VLDL 21.0 04/05/2019 1130   LDLCALC 163 (H) 04/05/2019 1130  The 10-year ASCVD risk score Mikey Bussing DC Jr., et al., 2013) is: 33.8%   Pharmacist Clinical Goal(s):  Marland Kitchen Over the next 90 days, patient will work with PharmD and providers towards optimized antihyperlipidemic therapy  Interventions: . Comprehensive medication review performed; medication list updated in electronic medical record.  . Discussed importance of diet for cholesterol improvement (see attached list of cholesterol content of foods)  Patient Self Care Activities:  . Patient will focus on medication adherence by pill box/pill pack  Initial goal documentation     . Pharmacy Care Plan       CARE PLAN ENTRY  Current Barriers:  . Chronic Disease Management support, education, and care coordination needs related to HTN, HLD, and DMII  Pharmacist Clinical Goal(s):  . Ensure safety, efficacy, and affordability of medications . Improve ease of medication administration  Interventions: . Utilize [facility] for medication synchronization, packaging and delivery  Patient Self Care Activities:  . Patient verbalizes understanding of plan to switch to [facility] pharmacy, Self administers medications as prescribed, Calls pharmacy for medication refills, and Calls provider office for new concerns or questions  Initial goal documentation    . Prediabetes: A1c < 6.5%       CARE PLAN ENTRY (see longtitudinal plan of care for additional care plan information)  Current Barriers:  . Diabetes: uncontrolled Lab Results  Component Value Date   HGBA1C 6.7 (H)  08/01/2019 .   Lab Results  Component Value Date   CREATININE 0.72 08/01/2019   CREATININE 0.86 04/10/2019   CREATININE 0.79 04/05/2019 .   Lab Results  Component Value Date   EGFR >90 01/02/2016   . Current antihyperglycemic regimen: NONE . Current exercise: when able to, limited by  pain  Pharmacist Clinical Goal(s):  Marland Kitchen Over the next 90 days, patient will work with PharmD and primary care provider to address diet changes  Interventions: . Comprehensive medication review performed, medication list updated in electronic medical record . Recommended to reduce carbohydrate (starch) intake and maximize vegetables and protein ("plate method")  Patient Self Care Activities:  . Patient will focus on diet changes to prevent diabetes progression  Initial goal documentation        Ms. Marohl was given information about Chronic Care Management services today including:  1. CCM service includes personalized support from designated clinical staff supervised by her physician, including individualized plan of care and coordination with other care providers 2. 24/7 contact phone numbers for assistance for urgent and routine care needs. 3. Standard insurance, coinsurance, copays and deductibles apply for chronic care management only during months in which we provide at least 20 minutes of these services. Most insurances cover these services at 100%, however patients may be responsible for any copay, coinsurance and/or deductible if applicable. This service may help you avoid the need for more expensive face-to-face services. 4. Only one practitioner may furnish and bill the service in a calendar month. 5. The patient may stop CCM services at any time (effective at the end of the month) by phone call to the office staff.  Patient agreed to services and verbal consent obtained.   The patient verbalized understanding of instructions provided today and agreed to receive a mailed copy of patient instruction and/or educational materials. Telephone follow up appointment with pharmacy team member scheduled for: 3 months  Charlene Brooke, PharmD Clinical Pharmacist [facility] [phone]    Cholesterol Content in Foods  Cholesterol is a waxy, fat-like  substance that helps to carry fat in the blood. The body needs cholesterol in small amounts, but too much cholesterol can cause damage to the arteries and heart. Most people should eat less than 200 milligrams (mg) of cholesterol a day.  Foods with cholesterol  Cholesterol is found in animal-based foods, such as meat, seafood, and dairy. Generally, low-fat dairy and lean meats have less cholesterol than full-fat dairy and fatty meats. The milligrams of cholesterol per serving (mg per serving) of common cholesterol-containing foods are listed below. Meat and other proteins  Egg -- one large whole egg has 186 mg.  Veal shank -- 4 oz has 141 mg.  Lean ground [place] (93% lean) -- 4 oz has 118 mg.  Fat-trimmed lamb loin -- 4 oz has 106 mg.  Lean ground beef (90% lean) -- 4 oz has 100 mg.  Lobster -- 3.5 oz has 90 mg.  Pork loin chops -- 4 oz has 86 mg.  Canned salmon -- 3.5 oz has 83 mg.  Fat-trimmed beef top loin -- 4 oz has 78 mg.  Frankfurter -- 1 frank (3.5 oz) has 77 mg.  Crab -- 3.5 oz has 71 mg.  Roasted chicken without skin, white meat -- 4 oz has 66 mg.  Light bologna -- 2 oz has 45 mg.  Deli-cut [place] -- 2 oz has 31 mg.  Canned tuna -- 3.5 oz has 31 mg.  Berniece Salines --  1 oz has 29 mg.  Oysters and mussels (raw) -- 3.5 oz has 25 mg.  Mackerel -- 1 oz has 22 mg.  Trout -- 1 oz has 20 mg.  Pork sausage -- 1 link (1 oz) has 17 mg.  Salmon -- 1 oz has 16 mg.  Tilapia -- 1 oz has 14 mg. Dairy  Soft-serve ice cream --  cup (4 oz) has 103 mg.  Whole-milk yogurt -- 1 cup (8 oz) has 29 mg.  Cheddar cheese -- 1 oz has 28 mg.  American cheese -- 1 oz has 28 mg.  Whole milk -- 1 cup (8 oz) has 23 mg.  2% milk -- 1 cup (8 oz) has 18 mg.  Cream cheese -- 1 tablespoon (Tbsp) has 15 mg.  Cottage cheese --  cup (4 oz) has 14 mg.  Low-fat (1%) milk -- 1 cup (8 oz) has 10 mg.  Sour cream -- 1 Tbsp has 8.5 mg.  Low-fat yogurt -- 1 cup (8 oz) has 8 mg.  Nonfat  Greek yogurt -- 1 cup (8 oz) has 7 mg.  Half-and-half cream -- 1 Tbsp has 5 mg. Fats and oils  Cod liver oil -- 1 tablespoon (Tbsp) has 82 mg.  Butter -- 1 Tbsp has 15 mg.  Lard -- 1 Tbsp has 14 mg.  Bacon grease -- 1 Tbsp has 14 mg.  Mayonnaise -- 1 Tbsp has 5-10 mg.  Margarine -- 1 Tbsp has 3-10 mg. Exact amounts of cholesterol in these foods may vary depending on specific ingredients and brands.  Foods without cholesterol Most plant-based foods do not have cholesterol unless you combine them with a food that has cholesterol. Foods without cholesterol include:  Grains and cereals.  Vegetables.  Fruits.  Vegetable oils, such as olive, canola, and sunflower oil.  Legumes, such as peas, beans, and lentils.  Nuts and seeds.  Egg whites.  Summary  The body needs cholesterol in small amounts, but too much cholesterol can cause damage to the arteries and heart.  Most people should eat less than 200 milligrams (mg) of cholesterol a day. This information is not intended to replace advice given to you by your health care provider. Make sure you discuss any questions you have with your health care provider. Document Revised: 09/03/2017 Document Reviewed: 05/18/2017 Elsevier Patient Education  [place].

## 2020-01-07 NOTE — Progress Notes (Signed)
I have collaborated with the care management provider regarding care management and care coordination activities outlined in this encounter and have reviewed this encounter including documentation in the note and care plan. I am certifying that I agree with the content of this note and encounter as supervising physician.  

## 2020-01-08 ENCOUNTER — Telehealth: Payer: Self-pay

## 2020-01-08 DIAGNOSIS — E785 Hyperlipidemia, unspecified: Secondary | ICD-10-CM

## 2020-01-08 DIAGNOSIS — I10 Essential (primary) hypertension: Secondary | ICD-10-CM

## 2020-01-08 MED ORDER — ROSUVASTATIN CALCIUM 20 MG PO TABS: 20.0000 mg | ORAL_TABLET | Freq: Every day | ORAL | 1 refills | Status: DC

## 2020-01-08 MED ORDER — INDAPAMIDE 1.25 MG PO TABS: 1.2500 mg | ORAL_TABLET | Freq: Every day | ORAL | 0 refills | Status: DC

## 2020-01-08 NOTE — Telephone Encounter (Signed)
-----   Message from [place], [facility] sent at 01/05/2020 11:11 AM EDT ----- Regarding: Med refills Pt is switching to [facility], can you order her refills please?  Indapamide 1.25 mg Rosuvastatin 20 mg

## 2020-01-16 ENCOUNTER — Other Ambulatory Visit: Payer: Self-pay | Admitting: Internal Medicine

## 2020-01-16 DIAGNOSIS — I10 Essential (primary) hypertension: Secondary | ICD-10-CM

## 2020-01-30 ENCOUNTER — Other Ambulatory Visit: Payer: Self-pay

## 2020-01-30 ENCOUNTER — Ambulatory Visit (INDEPENDENT_AMBULATORY_CARE_PROVIDER_SITE_OTHER): Payer: Medicare Other | Admitting: Internal Medicine

## 2020-01-30 ENCOUNTER — Ambulatory Visit (INDEPENDENT_AMBULATORY_CARE_PROVIDER_SITE_OTHER): Payer: Medicare Other

## 2020-01-30 ENCOUNTER — Encounter: Payer: Self-pay | Admitting: Internal Medicine

## 2020-01-30 VITALS — BP 156/88 | HR 80 | Temp 98.4°F | Resp 16 | Ht 66.0 in | Wt 160.5 lb

## 2020-01-30 DIAGNOSIS — M25562 Pain in left knee: Secondary | ICD-10-CM

## 2020-01-30 DIAGNOSIS — G8929 Other chronic pain: Secondary | ICD-10-CM | POA: Diagnosis not present

## 2020-01-30 DIAGNOSIS — I10 Essential (primary) hypertension: Secondary | ICD-10-CM

## 2020-01-30 DIAGNOSIS — M170 Bilateral primary osteoarthritis of knee: Secondary | ICD-10-CM | POA: Diagnosis not present

## 2020-01-30 DIAGNOSIS — E118 Type 2 diabetes mellitus with unspecified complications: Secondary | ICD-10-CM

## 2020-01-30 DIAGNOSIS — E785 Hyperlipidemia, unspecified: Secondary | ICD-10-CM

## 2020-01-30 MED ORDER — MELOXICAM 7.5 MG PO TABS: 7.5000 mg | ORAL_TABLET | Freq: Every day | ORAL | 1 refills | Status: DC

## 2020-01-30 NOTE — Patient Instructions (Signed)

## 2020-01-30 NOTE — Progress Notes (Signed)
Subjective:  Patient ID: Lauren Orozco, female    DOB: [birth year]  Age: 64 y.o. MRN: [id number]  CC: Osteoarthritis, Hypertension, and Diabetes  This visit occurred during the SARS-CoV-2 public health emergency.  Safety protocols were in place, including screening questions prior to the visit, additional usage of staff PPE, and extensive cleaning of exam room while observing appropriate contact time as indicated for disinfecting solutions.    HPI Lauren Orozco presents for f/up -  1.  She does not monitor her blood pressure.  She said she is compliant with indapamide.  She denies any recent episodes of headache, blurred vision, chest pain, shortness of breath, DOE, palpitations, edema, or fatigue.  2.  She complains of a 1 year history of nontraumatic left knee pain and intermittent swelling.  She is not getting much symptom relief with Tylenol.  She says some of her other joints bother her as well but none of them feel as bad as the knee and none of the other joints get swollen.  Outpatient Medications Prior to Visit  Medication Sig Dispense Refill  . acetaminophen (TYLENOL) 500 MG tablet Take 500 mg by mouth every 6 (six) hours as needed.    . Cholecalciferol 50 MCG (2000 UT) TABS Take 2 tablets (4,000 Units total) by mouth daily. 180 tablet 1  . fluticasone (FLONASE) 50 MCG/ACT nasal spray Place 2 sprays into both nostrils daily. 16 g 0  . gabapentin (NEURONTIN) 100 MG capsule Take 2 capsules (200 mg total) by mouth at bedtime. 60 capsule 3  . indapamide (LOZOL) 1.25 MG tablet Take 1 tablet (1.25 mg total) by mouth daily. 90 tablet 0  . rosuvastatin (CRESTOR) 20 MG tablet Take 1 tablet (20 mg total) by mouth daily. 90 tablet 1  . benzonatate (TESSALON) 200 MG capsule Take 1 capsule (200 mg total) by mouth 3 (three) times daily as needed for cough. 30 capsule 0   No facility-administered medications prior to visit.    ROS Review of Systems  Constitutional:  Negative.   HENT: Negative.   Eyes: Negative.   Respiratory: Negative for cough, chest tightness, shortness of breath and wheezing.   Cardiovascular: Negative for chest pain, palpitations and leg swelling.  Gastrointestinal: Negative for abdominal pain, constipation, diarrhea, nausea and vomiting.  Endocrine: Negative.  Negative for polydipsia, polyphagia and polyuria.  Genitourinary: Negative for difficulty urinating.  Musculoskeletal: Positive for arthralgias and joint swelling. Negative for myalgias.  Skin: Negative.   Neurological: Negative for dizziness, weakness and light-headedness.  Hematological: Negative for adenopathy. Does not bruise/bleed easily.  Psychiatric/Behavioral: Negative.     Objective:  BP (!) 156/88 (BP Location: Left Arm, Patient Position: Sitting, Cuff Size: Large)   Pulse 80   Temp 98.4 F (36.9 C) (Oral)   Resp 16   Ht 5\' 6"  (1.676 m)   Wt 160 lb 8 oz (72.8 kg)   SpO2 98%   BMI 25.91 kg/m   BP Readings from Last 3 Encounters:  01/30/20 (!) 156/88  09/12/19 (!) 157/89  08/01/19 138/84    Wt Readings from Last 3 Encounters:  01/30/20 160 lb 8 oz (72.8 kg)  08/01/19 161 lb (73 kg)  04/24/19 156 lb 9.6 oz (71 kg)    Physical Exam Vitals reviewed.  Constitutional:      Appearance: Normal appearance.  HENT:     Mouth/Throat:     Mouth: Mucous membranes are moist.  Eyes:     General: No scleral icterus.    Conjunctiva/sclera:  Conjunctivae normal.  Cardiovascular:     Rate and Rhythm: Normal rate and regular rhythm.     Heart sounds: No murmur.  Pulmonary:     Effort: Pulmonary effort is normal.     Breath sounds: No stridor. No wheezing, rhonchi or rales.  Abdominal:     General: Abdomen is flat. Bowel sounds are normal. There is no distension.     Palpations: Abdomen is soft. There is no hepatomegaly, splenomegaly or mass.     Tenderness: There is no abdominal tenderness. There is no guarding or rebound.  Musculoskeletal:         General: No signs of injury. Normal range of motion.     Cervical back: Neck supple.     Right knee: Deformity (mild DJD) present. No swelling, effusion, erythema, bony tenderness or crepitus. Normal range of motion. No tenderness. Normal alignment.     Left knee: Swelling (mild swelling and warmth) and deformity (mild DJD) present. No effusion, erythema, bony tenderness or crepitus. Normal range of motion. No tenderness. Normal alignment.     Right lower leg: No edema.     Left lower leg: No edema.  Lymphadenopathy:     Cervical: No cervical adenopathy.  Skin:    General: Skin is warm and dry.  Neurological:     General: No focal deficit present.     Mental Status: She is alert.  Psychiatric:        Mood and Affect: Mood normal.        Behavior: Behavior normal.     Lab Results  Component Value Date   WBC 6.4 01/30/2020   HGB 13.6 01/30/2020   HCT 39.5 01/30/2020   PLT 338.0 01/30/2020   GLUCOSE 110 (H) 01/30/2020   CHOL 128 01/30/2020   TRIG 47.0 01/30/2020   HDL 47.20 01/30/2020   LDLDIRECT 150.8 10/19/2011   LDLCALC 72 01/30/2020   ALT 25 12/20/2018   AST 16 12/20/2018   NA 140 01/30/2020   K 3.9 01/30/2020   CL 102 01/30/2020   CREATININE 0.88 01/30/2020   BUN 16 01/30/2020   CO2 31 01/30/2020   TSH 0.84 08/01/2019   HGBA1C 6.9 (H) 01/30/2020   MICROALBUR 1.4 01/30/2020   DG Knee Complete 4 Views Left  Result Date: 01/31/2020 CLINICAL DATA:  Chronic left knee pain without known injury. EXAM: LEFT KNEE - COMPLETE 4+ VIEW COMPARISON:  None. FINDINGS: No evidence of fracture, dislocation, or joint effusion. No evidence of arthropathy or other focal bone abnormality. Soft tissues are unremarkable. IMPRESSION: Negative. Electronically Signed   By: Marijo Conception M.D.   On: 01/31/2020 09:42    Assessment & Plan:   Lauren Orozco was seen today for osteoarthritis, hypertension and diabetes.  Diagnoses and all orders for this visit:  Essential hypertension- Her blood  pressure is not adequately well controlled.  I recommended that she start taking an SGLT2 inhibitor - this will limit help her achieve her blood pressure goal of 130/80.  Will continue the current dose of indapamide. -     CBC with Differential/Platelet; Future -     Basic metabolic panel; Future -     Basic metabolic panel -     CBC with Differential/Platelet  Type II diabetes mellitus with manifestations ([place])- Her A1c is up to 6.9%.  I recommended that she start taking Metformin and an SGLT2 inhibitor. -     Basic metabolic panel; Future -     Microalbumin / creatinine urine ratio; Future -  Hemoglobin A1c; Future -     Hemoglobin A1c -     Microalbumin / creatinine urine ratio -     Basic metabolic panel -     Dapagliflozin-metFORMIN HCl ER (XIGDUO XR) 10-500 MG TB24; Take 1 tablet by mouth daily. -     HM Diabetes Foot Exam  Hyperlipidemia with target LDL less than 130- She has achieved her LDL goal is doing well on the statin. -     Lipid panel; Future -     Lipid panel  Chronic pain of left knee- The exam is remarkable for degenerative changes.  Plain film is unremarkable.  Will treat for osteoarthritis. -     DG Knee Complete 4 Views Left; Future  Primary osteoarthritis of both knees -     meloxicam (MOBIC) 7.5 MG tablet; Take 1 tablet (7.5 mg total) by mouth daily.   I have discontinued Lestine Wortmann. Knoth's benzonatate. I am also having her start on meloxicam and Xigduo XR. Additionally, I am having her maintain her gabapentin, Cholecalciferol, fluticasone, acetaminophen, indapamide, and rosuvastatin.  Meds ordered this encounter  Medications  . meloxicam (MOBIC) 7.5 MG tablet    Sig: Take 1 tablet (7.5 mg total) by mouth daily.    Dispense:  90 tablet    Refill:  1  . Dapagliflozin-metFORMIN HCl ER (XIGDUO XR) 10-500 MG TB24    Sig: Take 1 tablet by mouth daily.    Dispense:  90 tablet    Refill:  1   I spent 50 minutes in preparing to see the patient by review  of recent labs, imaging and procedures, obtaining and reviewing separately obtained history, communicating with the patient and family or caregiver, ordering medications, tests or procedures, and documenting clinical information in the EHR including the differential Dx, treatment, and any further evaluation and other management of 1. Essential hypertension 2. Type II diabetes mellitus with manifestations ([place]) 3. Hyperlipidemia with target LDL less than 130 4. Chronic pain of left knee 5. Primary osteoarthritis of both knees    Follow-up: Return in about 3 months (around 04/30/2020).  Scarlette Calico, MD

## 2020-01-31 ENCOUNTER — Telehealth: Payer: Self-pay

## 2020-01-31 ENCOUNTER — Encounter: Payer: Self-pay | Admitting: Internal Medicine

## 2020-01-31 DIAGNOSIS — I10 Essential (primary) hypertension: Secondary | ICD-10-CM

## 2020-01-31 LAB — CBC WITH DIFFERENTIAL/PLATELET
Basophils Absolute: 0.1 10*3/uL (ref 0.0–0.1)
Basophils Relative: 0.9 % (ref 0.0–3.0)
Eosinophils Absolute: 0.1 10*3/uL (ref 0.0–0.7)
Eosinophils Relative: 1.4 % (ref 0.0–5.0)
HCT: 39.5 % (ref 36.0–46.0)
Hemoglobin: 13.6 g/dL (ref 12.0–15.0)
Lymphocytes Relative: 60.3 % — ABNORMAL HIGH (ref 12.0–46.0)
Lymphs Abs: 3.9 10*3/uL (ref 0.7–4.0)
MCHC: 34.4 g/dL (ref 30.0–36.0)
MCV: 90.1 fl (ref 78.0–100.0)
Monocytes Absolute: 0.6 10*3/uL (ref 0.1–1.0)
Monocytes Relative: 10.1 % (ref 3.0–12.0)
Neutro Abs: 1.8 10*3/uL (ref 1.4–7.7)
Neutrophils Relative %: 27.3 % — ABNORMAL LOW (ref 43.0–77.0)
Platelets: 338 10*3/uL (ref 150.0–400.0)
RBC: 4.38 Mil/uL (ref 3.87–5.11)
RDW: 13.7 % (ref 11.5–15.5)
WBC: 6.4 10*3/uL (ref 4.0–10.5)

## 2020-01-31 LAB — BASIC METABOLIC PANEL
BUN: 16 mg/dL (ref 6–23)
CO2: 31 mEq/L (ref 19–32)
Calcium: 10.2 mg/dL (ref 8.4–10.5)
Chloride: 102 mEq/L (ref 96–112)
Creatinine, Ser: 0.88 mg/dL (ref 0.40–1.20)
GFR: 78.39 mL/min (ref >60.00)
Glucose, Bld: 110 mg/dL — ABNORMAL HIGH (ref 70–99)
Potassium: 3.9 mEq/L (ref 3.5–5.1)
Sodium: 140 mEq/L (ref 135–145)

## 2020-01-31 LAB — LIPID PANEL
Cholesterol: 128 mg/dL (ref 0–200)
HDL: 47.2 mg/dL (ref >39.00)
LDL Cholesterol: 72 mg/dL (ref 0–99)
NonHDL: 81.02
Total CHOL/HDL Ratio: 3
Triglycerides: 47 mg/dL (ref 0.0–149.0)
VLDL: 9.4 mg/dL (ref 0.0–40.0)

## 2020-01-31 LAB — MICROALBUMIN / CREATININE URINE RATIO
Creatinine,U: 193.1 mg/dL
Microalb Creat Ratio: 0.7 mg/g (ref 0.0–30.0)
Microalb, Ur: 1.4 mg/dL (ref 0.0–1.9)

## 2020-01-31 LAB — HEMOGLOBIN A1C: Hgb A1c MFr Bld: 6.9 % — ABNORMAL HIGH (ref 4.6–6.5)

## 2020-01-31 MED ORDER — XIGDUO XR 10-500 MG PO TB24: 1.0000 | ORAL_TABLET | Freq: Every day | ORAL | 1 refills | Status: DC

## 2020-01-31 MED ORDER — INDAPAMIDE 1.25 MG PO TABS: 1.2500 mg | ORAL_TABLET | Freq: Every day | ORAL | 0 refills | Status: DC

## 2020-01-31 NOTE — Telephone Encounter (Signed)
-----   Message from [place], [facility] sent at 01/31/2020 12:10 PM EDT ----- Regarding: Indapamide refill [facility] is trying to synch up Aalyah's meds, and they need an additional 90-day refill for indapamide in order to do that. Can you send that in?

## 2020-01-31 NOTE — Telephone Encounter (Signed)
erx has been sent.  

## 2020-02-01 ENCOUNTER — Ambulatory Visit: Payer: Self-pay | Admitting: Pharmacist

## 2020-02-01 DIAGNOSIS — I10 Essential (primary) hypertension: Secondary | ICD-10-CM

## 2020-02-01 DIAGNOSIS — E118 Type 2 diabetes mellitus with unspecified complications: Secondary | ICD-10-CM

## 2020-02-01 NOTE — Chronic Care Management (AMB) (Signed)
  Chronic Care Management   Outreach Note  02/01/2020 Name: Lauren Orozco MRN: [id number] DOB: Oct 15, 1955  Referred by: Janith Lima, MD Reason for referral : Chronic Care Management and Medication Management  Reviewed chart for medication changes ahead of medication coordination call. . 01/30/20 Dr Ronnald Ramp OV: started Xigduo 10-500 mg for A1c up to 6.9 and BP benefits. Also started meloxicam for knee pain.   BP Readings from Last 3 Encounters:  01/30/20 (!) 156/88  09/12/19 (!) 157/89  08/01/19 138/84    Lab Results  Component Value Date   HGBA1C 6.9 (H) 01/30/2020     Medication management via [facility] pharmacy: Patient obtains medications through Vials  90 Days   Patient is due for next adherence delivery on: 02/05/20. Called patient and reviewed medications and coordinated delivery.  This delivery to include:  Meloxicam 7.5 mg daily   Rosuvastatin 20mg  daily   Indapamide 1.25mg  daily  Xigduo 10-500 mg daily  Coordinated acute fill for meloxicam 7.5 mg daily  Patient needs refills for indapamide - coordinated with PCP team.  Confirmed delivery date of 02/05/20, advised patient that pharmacy will contact them the morning of delivery.  Charlene Brooke, PharmD Clinical Pharmacist [facility] [phone]

## 2020-02-05 ENCOUNTER — Other Ambulatory Visit: Payer: Self-pay | Admitting: Internal Medicine

## 2020-02-05 DIAGNOSIS — E785 Hyperlipidemia, unspecified: Secondary | ICD-10-CM

## 2020-02-06 ENCOUNTER — Other Ambulatory Visit: Payer: Self-pay | Admitting: Internal Medicine

## 2020-02-06 ENCOUNTER — Encounter: Payer: Self-pay | Admitting: Internal Medicine

## 2020-02-08 ENCOUNTER — Other Ambulatory Visit: Payer: Self-pay

## 2020-02-08 ENCOUNTER — Encounter: Payer: Self-pay | Admitting: Internal Medicine

## 2020-02-08 ENCOUNTER — Ambulatory Visit (INDEPENDENT_AMBULATORY_CARE_PROVIDER_SITE_OTHER): Payer: Medicare Other | Admitting: Internal Medicine

## 2020-02-08 VITALS — BP 150/90 | HR 78 | Temp 98.3°F | Resp 16 | Ht 66.0 in | Wt 161.5 lb

## 2020-02-08 DIAGNOSIS — E118 Type 2 diabetes mellitus with unspecified complications: Secondary | ICD-10-CM | POA: Diagnosis not present

## 2020-02-08 DIAGNOSIS — I10 Essential (primary) hypertension: Secondary | ICD-10-CM | POA: Diagnosis not present

## 2020-02-08 DIAGNOSIS — Z1231 Encounter for screening mammogram for malignant neoplasm of breast: Secondary | ICD-10-CM

## 2020-02-08 MED ORDER — ACCU-CHEK GUIDE ME W/DEVICE KIT: 1.0000 | PACK | Freq: Two times a day (BID) | 0 refills | Status: AC

## 2020-02-08 MED ORDER — IRBESARTAN 150 MG PO TABS: 150.0000 mg | ORAL_TABLET | Freq: Every day | ORAL | 0 refills | Status: DC

## 2020-02-08 NOTE — Patient Instructions (Signed)

## 2020-02-08 NOTE — Progress Notes (Addendum)
Subjective:  Patient ID: Lauren Orozco, female    DOB: [birth year]  Age: 64 y.o. MRN: [id number]  CC: Hypertension and Diabetes  This visit occurred during the SARS-CoV-2 public health emergency.  Safety protocols were in place, including screening questions prior to the visit, additional usage of staff PPE, and extensive cleaning of exam room while observing appropriate contact time as indicated for disinfecting solutions.    HPI Lauren Orozco presents for f/up - She is concerned that her blood pressure is not adequately well controlled.  She walks several miles a day and does not experience CP, DOE, palpitations, edema, or fatigue.  She is doing well with lifestyle modifications but continues to smoke cigarettes.  She does not monitor her blood sugars.  She denies any recent episodes of headache, blurred vision, chest pain, shortness of breath, palpitations, edema, or fatigue.  Outpatient Medications Prior to Visit  Medication Sig Dispense Refill  . acetaminophen (TYLENOL) 500 MG tablet Take 500 mg by mouth every 6 (six) hours as needed.    . Cholecalciferol 50 MCG (2000 UT) TABS Take 2 tablets (4,000 Units total) by mouth daily. 180 tablet 1  . Dapagliflozin-metFORMIN HCl ER (XIGDUO XR) 10-500 MG TB24 Take 1 tablet by mouth daily. 90 tablet 1  . fluticasone (FLONASE) 50 MCG/ACT nasal spray Place 2 sprays into both nostrils daily. 16 g 0  . gabapentin (NEURONTIN) 100 MG capsule Take 2 capsules (200 mg total) by mouth at bedtime. 60 capsule 3  . indapamide (LOZOL) 1.25 MG tablet Take 1 tablet (1.25 mg total) by mouth daily. 90 tablet 0  . meloxicam (MOBIC) 7.5 MG tablet Take 1 tablet (7.5 mg total) by mouth daily. 90 tablet 1  . rosuvastatin (CRESTOR) 20 MG tablet TAKE 1 TABLET(20 MG) BY MOUTH DAILY 90 tablet 1   No facility-administered medications prior to visit.    ROS Review of Systems  Constitutional: Negative for diaphoresis, fatigue and unexpected weight  change.  HENT: Negative.   Eyes: Negative for visual disturbance.  Respiratory: Negative for cough, chest tightness, shortness of breath and wheezing.   Cardiovascular: Negative for chest pain, palpitations and leg swelling.  Gastrointestinal: Negative for abdominal pain, constipation, diarrhea and vomiting.  Endocrine: Negative.  Negative for polydipsia, polyphagia and polyuria.  Genitourinary: Negative.  Negative for difficulty urinating, dysuria and hematuria.  Musculoskeletal: Positive for arthralgias. Negative for myalgias.  Skin: Negative.  Negative for color change and pallor.  Neurological: Negative.  Negative for dizziness, weakness and headaches.  Hematological: Negative for adenopathy. Does not bruise/bleed easily.  Psychiatric/Behavioral: Negative.     Objective:  BP (!) 150/90 (BP Location: Left Arm, Patient Position: Sitting, Cuff Size: Large) Comment: BP (R) 150/90 (L) 152/86  Pulse 78   Temp 98.3 F (36.8 C) (Oral)   Resp 16   Ht _0  (1.676 m)   Wt 161 lb 8 oz (73.3 kg)   SpO2 98%   BMI 26.07 kg/m   BP Readings from Last 3 Encounters:  02/08/20 (!) 150/90  01/30/20 (!) 156/88  09/12/19 (!) 157/89    Wt Readings from Last 3 Encounters:  02/08/20 161 lb 8 oz (73.3 kg)  01/30/20 160 lb 8 oz (72.8 kg)  08/01/19 161 lb (73 kg)    Physical Exam Vitals reviewed.  Constitutional:      Appearance: Normal appearance.  HENT:     Nose: Nose normal.     Mouth/Throat:     Mouth: Mucous membranes are moist.  Eyes:  General: No scleral icterus.    Conjunctiva/sclera: Conjunctivae normal.  Cardiovascular:     Rate and Rhythm: Normal rate and regular rhythm.     Heart sounds: No murmur.  Pulmonary:     Effort: Pulmonary effort is normal.     Breath sounds: No stridor. No wheezing, rhonchi or rales.  Abdominal:     General: Abdomen is flat.     Palpations: There is no mass.     Tenderness: There is no abdominal tenderness. There is no guarding.    Musculoskeletal:        General: Normal range of motion.     Cervical back: Neck supple.     Right lower leg: No edema.     Left lower leg: No edema.  Lymphadenopathy:     Cervical: No cervical adenopathy.  Skin:    General: Skin is warm and dry.  Neurological:     General: No focal deficit present.     Mental Status: She is alert.  Psychiatric:        Mood and Affect: Mood normal.        Behavior: Behavior normal.     Lab Results  Component Value Date   WBC 6.4 01/30/2020   HGB 13.6 01/30/2020   HCT 39.5 01/30/2020   PLT 338.0 01/30/2020   GLUCOSE 110 (H) 01/30/2020   CHOL 128 01/30/2020   TRIG 47.0 01/30/2020   HDL 47.20 01/30/2020   LDLDIRECT 150.8 10/19/2011   LDLCALC 72 01/30/2020   ALT 25 12/20/2018   AST 16 12/20/2018   NA 140 01/30/2020   K 3.9 01/30/2020   CL 102 01/30/2020   CREATININE 0.88 01/30/2020   BUN 16 01/30/2020   CO2 31 01/30/2020   TSH 0.84 08/01/2019   HGBA1C 6.9 (H) 01/30/2020   MICROALBUR 1.4 01/30/2020    No results found.  Assessment & Plan:   Natascha was seen today for hypertension and diabetes.  Diagnoses and all orders for this visit:  Essential hypertension- Her blood pressure is not adequately well controlled.  She agrees to try to quit smoking.  Will add an ARB to her current regimen. -     irbesartan (AVAPRO) 150 MG tablet; Take 1 tablet (150 mg total) by mouth daily.  Type II diabetes mellitus with manifestations ([facility]) -     Amb Referral to [facility] -     Blood Glucose Monitoring Suppl (ACCU-CHEK GUIDE ME) w/Device KIT; 1 Act by Does not apply route in the morning and at bedtime. -     irbesartan (AVAPRO) 150 MG tablet; Take 1 tablet (150 mg total) by mouth daily.  Visit for screening mammogram -     MM DIGITAL SCREENING BILATERAL; Future   I am having Dashay Giesler. [place] start on Accu-Chek Guide Me and irbesartan. I am also having her maintain her gabapentin, Cholecalciferol, fluticasone, acetaminophen,  meloxicam, Xigduo XR, indapamide, and rosuvastatin.  Meds ordered this encounter  Medications  . Blood Glucose Monitoring Suppl (ACCU-CHEK GUIDE ME) w/Device KIT    Sig: 1 Act by Does not apply route in the morning and at bedtime.    Dispense:  1 kit    Refill:  0  . irbesartan (AVAPRO) 150 MG tablet    Sig: Take 1 tablet (150 mg total) by mouth daily.    Dispense:  90 tablet    Refill:  0     Follow-up: Return in about 3 months (around 05/10/2020).  Scarlette Calico, MD

## 2020-02-14 DIAGNOSIS — Z7984 Long term (current) use of oral hypoglycemic drugs: Secondary | ICD-10-CM | POA: Diagnosis not present

## 2020-02-14 DIAGNOSIS — E119 Type 2 diabetes mellitus without complications: Secondary | ICD-10-CM | POA: Diagnosis not present

## 2020-02-14 LAB — HM DIABETES EYE EXAM

## 2020-02-22 ENCOUNTER — Other Ambulatory Visit: Payer: Self-pay

## 2020-02-22 ENCOUNTER — Ambulatory Visit
Admission: RE | Admit: 2020-02-22 | Discharge: 2020-02-22 | Disposition: A | Discharge status: Home / Self Care | Payer: Medicare Other | Source: Ambulatory Visit | Attending: Internal Medicine | Admitting: Internal Medicine

## 2020-02-22 DIAGNOSIS — Z1231 Encounter for screening mammogram for malignant neoplasm of breast: Secondary | ICD-10-CM | POA: Diagnosis not present

## 2020-02-22 LAB — HM MAMMOGRAPHY

## 2020-02-27 ENCOUNTER — Encounter: Payer: Self-pay | Admitting: Internal Medicine

## 2020-03-14 ENCOUNTER — Encounter: Payer: Medicare Other | Attending: Internal Medicine | Admitting: Dietician

## 2020-03-14 ENCOUNTER — Other Ambulatory Visit: Payer: Self-pay

## 2020-03-14 ENCOUNTER — Encounter: Payer: Self-pay | Admitting: Dietician

## 2020-03-14 DIAGNOSIS — E118 Type 2 diabetes mellitus with unspecified complications: Secondary | ICD-10-CM | POA: Diagnosis not present

## 2020-03-14 NOTE — Progress Notes (Signed)
Core Class 1  Patient was seen on 03/14/2020 for the first of a series of three diabetes self-management courses at the [facility] office.  Patient Education Plan per assessed needs and concerns is to attend three course education program for Diabetes Self Management Education.  The following learning objectives were met by the patient during this class: ? Describe diabetes, types of diabetes and pathophysiology ? State some common risk factors for diabetes ? Defines the role of glucose and insulin ? Describe the relationship between diabetes and cardiovascular and other risks ? State the members of the Healthcare Team ? States the rationale for glucose monitoring and when to test ? State their individual Target Range ? State the importance of logging glucose readings and how to interpret the readings ? Identifies A1C target ? Explain the correlation between A1c and eAG values ? State symptoms and treatment of high blood glucose and low blood glucose ? Explain proper technique for glucose testing and identify proper sharps disposal  Handouts given during class include: ? How to Thrive:  A Guide for Your Journey with Diabetes by the ADA ? Meal Plan Card and carbohydrate content list ? Dietary intake form ? Low Sodium Flavoring Tips ? Types of Fats ? Dining Out ? Label reading ? Snack list ? Planning a balanced meal ? The diabetes portion plate ? Diabetes Resources ? A1c to eAG Conversion Chart ? Blood Glucose Log ? Diabetes Recommended Care Schedule ? Support Group ? Diabetes Success Plan ? Core Class Satisfaction Survey  Follow-Up Plan: ? Attend Core Class 2

## 2020-03-14 NOTE — Patient Instructions (Signed)
Attend Core Class 2 next week.

## 2020-03-21 ENCOUNTER — Encounter: Payer: Medicare Other | Admitting: Dietician

## 2020-03-21 ENCOUNTER — Other Ambulatory Visit: Payer: Self-pay

## 2020-03-21 DIAGNOSIS — E118 Type 2 diabetes mellitus with unspecified complications: Secondary | ICD-10-CM | POA: Diagnosis not present

## 2020-03-22 ENCOUNTER — Encounter: Payer: Self-pay | Admitting: Dietician

## 2020-03-22 NOTE — Progress Notes (Signed)
Patient was seen on 03/21/2020 for the second of a series of three diabetes self-management courses at the [facility]. The following learning objectives were met by the patient during this class:   Describe the role of different macronutrients on glucose  Explain how carbohydrates affect blood glucose  State what foods contain the most carbohydrates  Demonstrate carbohydrate counting  Demonstrate how to read Nutrition Facts food label  Describe effects of various fats on heart health  Describe the importance of good nutrition for health and healthy eating strategies  Describe techniques for managing your shopping, cooking and meal planning  List strategies to follow meal plan when dining out  Describe the effects of alcohol on glucose and how to use it safely  Goals:  Follow Diabetes Meal Plan as instructed  Aim to spread carbs evenly throughout the day  Aim for 3 meals per day and snacks as needed Include lean protein foods to meals/snacks  Monitor glucose levels as instructed by your doctor   Follow-Up Plan:  Attend Core 3  Work towards following your personal food plan.

## 2020-03-28 ENCOUNTER — Ambulatory Visit: Payer: Medicare Other

## 2020-04-05 ENCOUNTER — Telehealth: Payer: Self-pay

## 2020-04-05 ENCOUNTER — Other Ambulatory Visit: Payer: Self-pay | Admitting: Hematology and Oncology

## 2020-04-05 ENCOUNTER — Other Ambulatory Visit: Payer: Medicare Other

## 2020-04-05 DIAGNOSIS — Z720 Tobacco use: Secondary | ICD-10-CM

## 2020-04-05 DIAGNOSIS — C8590 Non-Hodgkin lymphoma, unspecified, unspecified site: Secondary | ICD-10-CM

## 2020-04-05 NOTE — Telephone Encounter (Signed)
Called to give 7/14 lab and CT appt. She is on vacation that week and unavailable. Will call and reschedule lab/CT appt.

## 2020-04-05 NOTE — Telephone Encounter (Signed)
-----   Message from Heath Lark, MD sent at 04/05/2020  9:16 AM EDT ----- Regarding: CT Can you help scchedule her CT scan to be done around 7/13 and make sure it gets authorized>? Thanks

## 2020-04-05 NOTE — Telephone Encounter (Signed)
Called back and given lab and CT appt on 7/19. She verbalized understanding.

## 2020-04-11 ENCOUNTER — Other Ambulatory Visit: Payer: Self-pay

## 2020-04-11 ENCOUNTER — Ambulatory Visit (INDEPENDENT_AMBULATORY_CARE_PROVIDER_SITE_OTHER): Payer: Medicare Other

## 2020-04-11 ENCOUNTER — Encounter: Payer: Self-pay | Admitting: Internal Medicine

## 2020-04-11 ENCOUNTER — Ambulatory Visit (INDEPENDENT_AMBULATORY_CARE_PROVIDER_SITE_OTHER): Payer: Medicare Other | Admitting: Internal Medicine

## 2020-04-11 VITALS — BP 152/82 | HR 70 | Temp 98.8°F | Resp 16 | Ht 66.0 in | Wt 156.0 lb

## 2020-04-11 DIAGNOSIS — C8590 Non-Hodgkin lymphoma, unspecified, unspecified site: Secondary | ICD-10-CM | POA: Diagnosis not present

## 2020-04-11 DIAGNOSIS — G8929 Other chronic pain: Secondary | ICD-10-CM | POA: Insufficient documentation

## 2020-04-11 DIAGNOSIS — R0789 Other chest pain: Secondary | ICD-10-CM | POA: Diagnosis not present

## 2020-04-11 DIAGNOSIS — I10 Essential (primary) hypertension: Secondary | ICD-10-CM | POA: Diagnosis not present

## 2020-04-11 DIAGNOSIS — Z720 Tobacco use: Secondary | ICD-10-CM

## 2020-04-11 NOTE — Progress Notes (Signed)
Subjective:  Patient ID: Lauren Orozco, female    DOB: September 26, 1956  Age: 64 y.o. MRN: [id number]  CC: Back Pain  This visit occurred during the SARS-CoV-2 public health emergency.  Safety protocols were in place, including screening questions prior to the visit, additional usage of staff PPE, and extensive cleaning of exam room while observing appropriate contact time as indicated for disinfecting solutions.    HPI Lauren Orozco presents for the complaint of a several month history of pain around her mid back.  The pain is not in the midline.  It is over both posterior rib cages.  She denies any trauma or injury.  She also has chronic knee pain.  She gets adequate symptom relief with meloxicam.  She walks about 3 miles a day and does not experience dyspnea on exertion, exertional chest pain, diaphoresis, dizziness, lightheadedness, cough, wheezing, or hemoptysis.  Outpatient Medications Prior to Visit  Medication Sig Dispense Refill  . acetaminophen (TYLENOL) 500 MG tablet Take 500 mg by mouth every 6 (six) hours as needed.    . Blood Glucose Monitoring Suppl (ACCU-CHEK GUIDE ME) w/Device KIT 1 Act by Does not apply route in the morning and at bedtime. 1 kit 0  . Cholecalciferol 50 MCG (2000 UT) TABS Take 2 tablets (4,000 Units total) by mouth daily. 180 tablet 1  . Dapagliflozin-metFORMIN HCl ER (XIGDUO XR) 10-500 MG TB24 Take 1 tablet by mouth daily. 90 tablet 1  . indapamide (LOZOL) 1.25 MG tablet Take 1 tablet (1.25 mg total) by mouth daily. 90 tablet 0  . irbesartan (AVAPRO) 150 MG tablet Take 1 tablet (150 mg total) by mouth daily. 90 tablet 0  . meloxicam (MOBIC) 7.5 MG tablet Take 1 tablet (7.5 mg total) by mouth daily. 90 tablet 1  . rosuvastatin (CRESTOR) 20 MG tablet TAKE 1 TABLET(20 MG) BY MOUTH DAILY 90 tablet 1  . fluticasone (FLONASE) 50 MCG/ACT nasal spray Place 2 sprays into both nostrils daily. 16 g 0  . gabapentin (NEURONTIN) 100 MG capsule Take 2  capsules (200 mg total) by mouth at bedtime. 60 capsule 3   No facility-administered medications prior to visit.    ROS Review of Systems  Constitutional: Negative.  Negative for appetite change, diaphoresis, fatigue and unexpected weight change.  HENT: Negative.   Eyes: Negative for visual disturbance.  Respiratory: Negative for cough, chest tightness, shortness of breath and wheezing.   Cardiovascular: Negative for chest pain, palpitations and leg swelling.  Gastrointestinal: Negative for abdominal pain, blood in stool, constipation, diarrhea, nausea and vomiting.  Endocrine: Negative.   Genitourinary: Negative.  Negative for difficulty urinating, dysuria, flank pain and frequency.  Musculoskeletal: Positive for arthralgias and back pain. Negative for myalgias and neck pain.  Skin: Negative.  Negative for color change, pallor and rash.  Neurological: Negative for dizziness, weakness, light-headedness and headaches.  Hematological: Negative for adenopathy. Does not bruise/bleed easily.  Psychiatric/Behavioral: Negative.     Objective:  BP (!) 152/82 (BP Location: Left Arm, Patient Position: Sitting, Cuff Size: Normal)   Pulse 70   Temp 98.8 F (37.1 C) (Oral)   Resp 16   Ht _0  (1.676 m)   Wt 156 lb (70.8 kg)   SpO2 96%   BMI 25.18 kg/m   BP Readings from Last 3 Encounters:  04/11/20 (!) 152/82  02/08/20 (!) 150/90  01/30/20 (!) 156/88    Wt Readings from Last 3 Encounters:  04/11/20 156 lb (70.8 kg)  03/14/20 158 lb 6.4  oz (71.8 kg)  02/08/20 161 lb 8 oz (73.3 kg)    Physical Exam Vitals reviewed.  Constitutional:      Appearance: Normal appearance.  HENT:     Nose: Nose normal.     Mouth/Throat:     Mouth: Mucous membranes are moist.  Eyes:     General: No scleral icterus.    Conjunctiva/sclera: Conjunctivae normal.  Cardiovascular:     Rate and Rhythm: Normal rate and regular rhythm.     Pulses: Normal pulses.     Heart sounds: No murmur heard.  No  friction rub. No gallop.   Pulmonary:     Breath sounds: No stridor. No wheezing, rhonchi or rales.  Chest:     Chest wall: No mass, deformity, swelling, tenderness or crepitus.  Abdominal:     General: Abdomen is flat.     Palpations: There is no mass.     Tenderness: There is no abdominal tenderness. There is no guarding.  Musculoskeletal:     Cervical back: Normal and neck supple.     Thoracic back: Normal. No swelling, edema, spasms, tenderness or bony tenderness. Normal range of motion.     Lumbar back: Normal.  Lymphadenopathy:     Cervical: No cervical adenopathy.  Skin:    General: Skin is warm and dry.     Coloration: Skin is not pale.  Neurological:     General: No focal deficit present.     Mental Status: She is alert.  Psychiatric:        Mood and Affect: Mood normal.        Behavior: Behavior normal.     Lab Results  Component Value Date   WBC 6.4 01/30/2020   HGB 13.6 01/30/2020   HCT 39.5 01/30/2020   PLT 338.0 01/30/2020   GLUCOSE 110 (H) 01/30/2020   CHOL 128 01/30/2020   TRIG 47.0 01/30/2020   HDL 47.20 01/30/2020   LDLDIRECT 150.8 10/19/2011   LDLCALC 72 01/30/2020   ALT 25 12/20/2018   AST 16 12/20/2018   NA 140 01/30/2020   K 3.9 01/30/2020   CL 102 01/30/2020   CREATININE 0.88 01/30/2020   BUN 16 01/30/2020   CO2 31 01/30/2020   TSH 0.84 08/01/2019   HGBA1C 6.9 (H) 01/30/2020   MICROALBUR 1.4 01/30/2020    MM DIGITAL SCREENING BILATERAL  Result Date: 02/23/2020 CLINICAL DATA:  Screening. EXAM: DIGITAL SCREENING BILATERAL MAMMOGRAM WITH CAD COMPARISON:  Previous exam(s). ACR Breast Density Category c: The breast tissue is heterogeneously dense, which may obscure small masses. FINDINGS: There are no findings suspicious for malignancy. Images were processed with CAD. IMPRESSION: No mammographic evidence of malignancy. A result letter of this screening mammogram will be mailed directly to the patient. RECOMMENDATION: Screening mammogram in one  year. (Code:SM-B-01Y) BI-RADS CATEGORY  1: Negative. Electronically Signed   By: Ammie Ferrier M.D.   On: 02/23/2020 16:21   DG Chest 2 View  Result Date: 04/12/2020 CLINICAL DATA:  Chronic intrascapular pain; worsening. Hx of HTN, NHL. Ex smoker. bilateral posterior-lateral CW pain for several months EXAM: CHEST - 2 VIEW COMPARISON:  None. FINDINGS: Normal mediastinum and cardiac silhouette. Hyperinflated lungs. Chronic central bronchitic markings. Normal pulmonary vasculature. No effusion, infiltrate, or pneumothorax. IMPRESSION: Chronic bronchitic markings. No acute findings. Lungs hyperinflated. Electronically Signed   By: Suzy Bouchard M.D.   On: 04/12/2020 15:35    Assessment & Plan:   Ahava was seen today for back pain.  Diagnoses and all orders  for this visit:  Chronic chest wall pain- Based on her symptoms, exam, and normal plain films I think she is experiencing musculoskeletal chest wall pain.  Will continue the current dose of meloxicam. -     DG Chest 2 View; Future  Low grade lymphoma, stage I ([place])- There is no evidence of recurrence of lymphoma. -     DG Chest 2 View; Future  Tobacco abuse- I recommended that she proceed with her annual lung cancer screening. -     Ambulatory Referral for Lung Cancer Scre   I have discontinued [name]. Andreas's gabapentin and fluticasone. I am also having her maintain her Cholecalciferol, acetaminophen, meloxicam, Xigduo XR, indapamide, rosuvastatin, Accu-Chek Guide Me, and irbesartan.  No orders of the defined types were placed in this encounter.    Follow-up: Return in about 4 weeks (around 05/09/2020).  Scarlette Calico, MD

## 2020-04-11 NOTE — Patient Instructions (Signed)

## 2020-04-16 ENCOUNTER — Other Ambulatory Visit: Payer: Medicare Other

## 2020-04-17 ENCOUNTER — Other Ambulatory Visit: Payer: Medicare Other

## 2020-04-17 ENCOUNTER — Ambulatory Visit (HOSPITAL_COMMUNITY): Payer: Medicare Other

## 2020-04-17 ENCOUNTER — Telehealth: Payer: Medicare Other

## 2020-04-17 NOTE — Chronic Care Management (AMB) (Deleted)
Chronic Care Management Pharmacy  Name: Lauren Orozco  MRN: [id number] DOB: [birth year]  Chief Complaint/ HPI  [place],  64 y.o. , female presents for their Initial CCM visit with the clinical pharmacist via telephone due to COVID-19 Pandemic.  PCP : Janith Lima, MD  Their chronic conditions include: Hypertension, Hyperlipidemia, Diabetes, Osteoarthritis and Tobacco use  Lived here whole life, 80 of 13 kids, took care of 2 sick parents. Neck fused 2009, had to stop working, caused a lot of pain.  Office Visits: 04/11/20 Dr Ronnald Ramp OV: back/chest wall pain - continue meloxicam.   02/08/20 Dr Ronnald Ramp OV: added irbesartan 150 mg (BP uncontrolled and DM worsening). Referred to nutritionist for DM.  01/30/20 Dr Ronnald Ramp OV: started Xigduo 10-500 mg for A1c up to 6.9 and BP benefits. Also started meloxicam for knee pain.  08/01/19 Dr Ronnald Ramp OV: c/o palpitations. EKG normal. Ordered 48-hr Holter monitor. Pt was not taking statin, asked to restart rosuvastatin.   Consult Visit: 03/14/20, 03/21/20 Alvester Morin RD: DM self-mgmt course, given many handouts.  09/12/19 ED visit: URI and arm pain. COVID negative. Rx'd flonase, benzonatate. Rec'd Tylenol/ibuprofen for arm pain.   Medications: Outpatient Encounter Medications as of 04/17/2020  Medication Sig  . acetaminophen (TYLENOL) 500 MG tablet Take 500 mg by mouth every 6 (six) hours as needed.  . Blood Glucose Monitoring Suppl (ACCU-CHEK GUIDE ME) w/Device KIT 1 Act by Does not apply route in the morning and at bedtime.  . Cholecalciferol 50 MCG (2000 UT) TABS Take 2 tablets (4,000 Units total) by mouth daily.  . Dapagliflozin-metFORMIN HCl ER (XIGDUO XR) 10-500 MG TB24 Take 1 tablet by mouth daily.  . indapamide (LOZOL) 1.25 MG tablet Take 1 tablet (1.25 mg total) by mouth daily.  . irbesartan (AVAPRO) 150 MG tablet Take 1 tablet (150 mg total) by mouth daily.  . meloxicam (MOBIC) 7.5 MG tablet Take 1 tablet (7.5 mg total) by  mouth daily.  . rosuvastatin (CRESTOR) 20 MG tablet TAKE 1 TABLET(20 MG) BY MOUTH DAILY   No facility-administered encounter medications on file as of 04/17/2020.     Current Diagnosis/Assessment:    Goals Addressed   None     Hypertension   BP goal < 130/80  Office blood pressures are  BP Readings from Last 3 Encounters:  04/11/20 (!) 152/82  02/08/20 (!) 150/90  01/30/20 (!) 156/88   Kidney Function Lab Results  Component Value Date/Time   CREATININE 0.88 01/30/2020 04:34 PM   CREATININE 0.72 08/01/2019 11:18 AM   CREATININE 0.8 01/02/2016 10:34 AM   CREATININE 0.8 12/18/2014 11:12 AM   GFR 78.39 01/30/2020 04:34 PM   GFRNONAA >60 04/10/2019 07:53 PM   GFRAA >60 04/10/2019 07:53 PM   K 3.9 01/30/2020 04:34 PM   K 3.7 08/01/2019 11:18 AM   K 4.0 01/02/2016 10:34 AM   K 4.1 12/18/2014 11:12 AM   Patient checks BP at home 3-5x per week  Patient home BP readings are ranging: SBP 120s-130s  Patient has failed these meds in the past: chlorthalidone, azilsartan, telmisartan Patient is currently controlled on the following medications:   indapamide 1.25 mg daily AM  Irbesartan 150 mg daily  We discussed: importance of hydration last visit; ***  Plan  Continue current medications and control with diet and exercise  Recommended to increase daily water intake   Hyperlipidemia   LDL goal < 100  Lipid Panel     Component Value Date/Time   CHOL 128  01/30/2020 1634   TRIG 47.0 01/30/2020 1634   HDL 47.20 01/30/2020 1634   CHOLHDL 3 01/30/2020 1634   VLDL 9.4 01/30/2020 1634   LDLCALC 72 01/30/2020 1634   LDLDIRECT 150.8 10/19/2011 1336   The ASCVD Risk score (Goff DC Jr., et al., 2013) failed to calculate for the following reasons:   The valid total cholesterol range is 130 to 320 mg/dL   Patient has failed these meds in past: atorvastatin, lovastatin (no statin allergy listed) Patient is currently controlled on the following medications:    rosuvastatin 20 mg HS  We discussed:  diet and exercise extensively , types of cholesterol, role of cholesterol in ASCVD, benefits of statin. Pt denies issues with statin, she is taking it nightly.  Plan  Continue current medications and control with diet and exercise   Diabetes   A1c goal <7%  Recent Relevant Labs: Lab Results  Component Value Date/Time   HGBA1C 6.9 (H) 01/30/2020 04:34 PM   HGBA1C 6.7 (H) 08/01/2019 11:18 AM   GFR 78.39 01/30/2020 04:34 PM   GFR 98.97 08/01/2019 11:18 AM   MICROALBUR 1.4 01/30/2020 04:34 PM   MICROALBUR 0.9 12/20/2018 11:38 AM    Last diabetic Eye exam:  Lab Results  Component Value Date/Time   HMDIABEYEEXA No Retinopathy 02/14/2020 12:00 AM    Last diabetic Foot exam: No results found for: HMDIABFOOTEX   Checking BG: {CHL HP Blood Glucose Monitoring [phone]}  Recent FBG Readings: *** Recent pre-meal BG readings: *** Recent 2hr PP BG readings:  *** Recent HS BG readings: ***  Patient has failed these meds in past: none Patient is currently {CHL Controlled/Uncontrolled:[phone]} on the following medications: . Xigduo (dapagliflozin-metformin) 10-500 mg once daily  We discussed: {CHL HP [facility] [phone]}  Plan  Continue {CHL HP [facility] INOMV:[id number]}  Tobacco Abuse   Tobacco Status:  Social History   Tobacco Use  Smoking Status Former Smoker  . Packs/day: 0.25  . Years: 30.00  . Pack years: 7.50  . Types: Cigarettes  Smokeless Tobacco Never Used  Tobacco Comment   stopped around march    Patient smokes {Time to first cigarette:23873} Patient triggers include: {Smoking Triggers:23882} On a scale of 1-10, reports MOTIVATION to quit is *** On a scale of 1-10, reports CONFIDENCE in quitting is ***  Patient has failed these meds in past: *** Patient is currently {CHL Controlled/Uncontrolled:[phone]} on the following medications:  . ***  We discussed:  {Smoking  Cessation Counseling:23883}  Plan  Continue {CHL HP [facility] GEZMO:[id number]}  Arthritis/ DDD   Patient has failed these meds in past: oxycodone, gabapentin Patient is currently controlled on the following medications:   Tylenol Extra strength 1 daily  Meloxicam 7.5 mg daily  We discussed:  Max daily dose of Tylenol, she is currently only taking 500 mg/day as needed, recommended to increase Tylenol up to 3g/day if needed for pain control. Pt reports she is no longer taking gapapentin, she did not like the way it made her feel.   Plan  Continue current medications    Health Maintenance   Lab Results  Component Value Date/Time   VD25OH 21.41 (L) 04/05/2019 11:30 AM   VD25OH 17.58 (L) 12/20/2018 11:38 AM   Patient is currently controlled on the following medications:   Vitamin D 2000 IU daily  Multivitamin  We discussed:  Patient is satisfied with current OTC regimen and denies issues. She is worried about magnesium and potassium levels, last checked in 07/2019, K was 3.7  and Mg was 1.8. Recommended to start multivitamin.  Plan  Continue current medications  Medication Management   Pt uses [facility] for all medications Uses pill box Pt endorses 100% compliance  We discussed: Reviewed patient's [facility] medication and Epic medication profile assuring there are no discrepancies or gaps in therapy. Confirmed all fill dates appropriate and verified with patient that there is a sufficient quantity of all prescribed medications at home. Informed patient to call me any time if needing medications before scheduled deliveries. The anticipated medication sync date is 05/05/20.  Plan  Utilize [facility] for medication synchronization, packaging and delivery     Follow up: *** month phone visit  Charlene Brooke, PharmD Clinical Pharmacist [facility] [phone]

## 2020-04-22 ENCOUNTER — Other Ambulatory Visit: Payer: Self-pay

## 2020-04-22 ENCOUNTER — Inpatient Hospital Stay: Payer: Medicare Other | Attending: Hematology and Oncology

## 2020-04-22 ENCOUNTER — Ambulatory Visit (HOSPITAL_COMMUNITY)
Admission: RE | Admit: 2020-04-22 | Discharge: 2020-04-22 | Disposition: A | Discharge status: Home / Self Care | Payer: Medicare Other | Source: Ambulatory Visit | Attending: Hematology and Oncology | Admitting: Hematology and Oncology

## 2020-04-22 DIAGNOSIS — Z87891 Personal history of nicotine dependence: Secondary | ICD-10-CM | POA: Diagnosis not present

## 2020-04-22 DIAGNOSIS — I700 Atherosclerosis of aorta: Secondary | ICD-10-CM | POA: Diagnosis not present

## 2020-04-22 DIAGNOSIS — Z79899 Other long term (current) drug therapy: Secondary | ICD-10-CM | POA: Insufficient documentation

## 2020-04-22 DIAGNOSIS — D3501 Benign neoplasm of right adrenal gland: Secondary | ICD-10-CM | POA: Insufficient documentation

## 2020-04-22 DIAGNOSIS — C8590 Non-Hodgkin lymphoma, unspecified, unspecified site: Secondary | ICD-10-CM

## 2020-04-22 DIAGNOSIS — J439 Emphysema, unspecified: Secondary | ICD-10-CM | POA: Insufficient documentation

## 2020-04-22 DIAGNOSIS — Z122 Encounter for screening for malignant neoplasm of respiratory organs: Secondary | ICD-10-CM | POA: Diagnosis not present

## 2020-04-22 DIAGNOSIS — F17210 Nicotine dependence, cigarettes, uncomplicated: Secondary | ICD-10-CM | POA: Diagnosis not present

## 2020-04-22 DIAGNOSIS — E119 Type 2 diabetes mellitus without complications: Secondary | ICD-10-CM | POA: Insufficient documentation

## 2020-04-22 LAB — COMPREHENSIVE METABOLIC PANEL
ALT: 19 U/L (ref 0–44)
AST: 19 U/L (ref 15–41)
Albumin: 4.4 g/dL (ref 3.5–5.0)
Alkaline Phosphatase: 65 U/L (ref 38–126)
Anion gap: 10 (ref 5–15)
BUN: 15 mg/dL (ref 8–23)
CO2: 28 mmol/L (ref 22–32)
Calcium: 10.2 mg/dL (ref 8.9–10.3)
Chloride: 104 mmol/L (ref 98–111)
Creatinine, Ser: 0.95 mg/dL (ref 0.44–1.00)
GFR calc Af Amer: >60 mL/min (ref >60)
GFR calc non Af Amer: >60 mL/min (ref >60)
Glucose, Bld: 119 mg/dL — ABNORMAL HIGH (ref 70–99)
Potassium: 4.1 mmol/L (ref 3.5–5.1)
Sodium: 142 mmol/L (ref 135–145)
Total Bilirubin: 0.7 mg/dL (ref 0.3–1.2)
Total Protein: 7.8 g/dL (ref 6.5–8.1)

## 2020-04-22 LAB — CBC WITH DIFFERENTIAL/PLATELET
Abs Immature Granulocytes: 0.01 10*3/uL (ref 0.00–0.07)
Basophils Absolute: 0 10*3/uL (ref 0.0–0.1)
Basophils Relative: 1 %
Eosinophils Absolute: 0.1 10*3/uL (ref 0.0–0.5)
Eosinophils Relative: 1 %
HCT: 40.8 % (ref 36.0–46.0)
Hemoglobin: 13.6 g/dL (ref 12.0–15.0)
Immature Granulocytes: 0 %
Lymphocytes Relative: 56 %
Lymphs Abs: 3.3 10*3/uL (ref 0.7–4.0)
MCH: 29.6 pg (ref 26.0–34.0)
MCHC: 33.3 g/dL (ref 30.0–36.0)
MCV: 88.7 fL (ref 80.0–100.0)
Monocytes Absolute: 0.6 10*3/uL (ref 0.1–1.0)
Monocytes Relative: 10 %
Neutro Abs: 1.9 10*3/uL (ref 1.7–7.7)
Neutrophils Relative %: 32 %
Platelets: 344 10*3/uL (ref 150–400)
RBC: 4.6 MIL/uL (ref 3.87–5.11)
RDW: 13.2 % (ref 11.5–15.5)
WBC: 5.9 10*3/uL (ref 4.0–10.5)
nRBC: 0 % (ref 0.0–0.2)

## 2020-04-22 LAB — LACTATE DEHYDROGENASE: LDH: 231 U/L — ABNORMAL HIGH (ref 98–192)

## 2020-04-23 ENCOUNTER — Inpatient Hospital Stay: Payer: Medicare Other | Admitting: Hematology and Oncology

## 2020-04-23 ENCOUNTER — Encounter: Payer: Self-pay | Admitting: Hematology and Oncology

## 2020-04-23 ENCOUNTER — Other Ambulatory Visit: Payer: Self-pay

## 2020-04-23 VITALS — BP 148/81 | HR 77 | Temp 98.3°F | Resp 18 | Ht 66.0 in | Wt 153.8 lb

## 2020-04-23 DIAGNOSIS — C8590 Non-Hodgkin lymphoma, unspecified, unspecified site: Secondary | ICD-10-CM | POA: Diagnosis not present

## 2020-04-23 DIAGNOSIS — E119 Type 2 diabetes mellitus without complications: Secondary | ICD-10-CM | POA: Diagnosis not present

## 2020-04-23 DIAGNOSIS — Z720 Tobacco use: Secondary | ICD-10-CM

## 2020-04-23 DIAGNOSIS — E118 Type 2 diabetes mellitus with unspecified complications: Secondary | ICD-10-CM | POA: Diagnosis not present

## 2020-04-23 DIAGNOSIS — Z79899 Other long term (current) drug therapy: Secondary | ICD-10-CM | POA: Diagnosis not present

## 2020-04-23 DIAGNOSIS — F17210 Nicotine dependence, cigarettes, uncomplicated: Secondary | ICD-10-CM | POA: Diagnosis not present

## 2020-04-23 NOTE — Assessment & Plan Note (Signed)
I have reviewed CT imaging with her There are no signs of cancer Due to her smoking history, she would benefit from another annual CT screening based on recommendation by radiologist I will see her back next year for further follow-up

## 2020-04-23 NOTE — Progress Notes (Signed)
[place] OFFICE PROGRESS NOTE  Patient Care Team: Janith Lima, MD as PCP - General (Internal Medicine) Heath Lark, MD as Consulting Physician (Hematology and Oncology) Lyndal Pulley, DO as Attending Physician (Family Medicine) Alda Berthold, DO as Consulting Physician (Neurology) Charlton Haws, [facility] as Pharmacist (Pharmacist)  ASSESSMENT & PLAN:  Low grade lymphoma, stage I [facility]) I have reviewed CT imaging with her There are no signs of cancer Due to her smoking history, she would benefit from another annual CT screening based on recommendation by radiologist I will see her back next year for further follow-up  Tobacco abuse The patient has smoked for over 30 years and is motivated to quit We reviewed CT imaging She appears to be interested to quit smoking; she is committed to smoking less than half a pack of cigarettes per day by next year or quit  Type II diabetes mellitus with manifestations ([place]) We discussed risk factors for heart disease with smoking and diabetes She is attempting to make healthy dietary choices and exercise   Orders Placed This Encounter  Procedures  . CT CHEST LUNG CA SCREEN LOW DOSE W/O CM    Standing Status:   Future    Standing Expiration Date:   04/23/2021    Order Specific Question:   Reason for Exam (SYMPTOM  OR DIAGNOSIS REQUIRED)    Answer:   hx lung nodule, smoker, Hx lymphoma    Order Specific Question:   Preferred Imaging Location?    Answer:   [facility]    Order Specific Question:   Radiology Contrast Protocol - do NOT remove file path    Answer:   [email or web address]  . Comprehensive metabolic panel    Standing Status:   Standing    Number of Occurrences:   33    Standing Expiration Date:   04/23/2021  . CBC with Differential/Platelet    Standing Status:   Standing    Number of Occurrences:   22    Standing Expiration Date:   04/23/2021    All questions were  answered. The patient knows to call the clinic with any problems, questions or concerns. The total time spent in the appointment was 20 minutes encounter with patients including review of chart and various tests results, discussions about plan of care and coordination of care plan   Heath Lark, MD 04/23/2020 1:09 PM  INTERVAL HISTORY: Please see below for problem oriented charting. She returns for lymphoma follow-up Denies new lymphadenopathy She continues to smoke but less than 1 pack of cigarettes per day She is attempting to exercise regularly No recent cough, chest pain or shortness of breath Denies recent infection  SUMMARY OF ONCOLOGIC HISTORY: Oncology History  Low grade lymphoma, stage I ([place])  12/18/2014 Pathology Results   Accession: [id number] Flow cytometry showed NHL   01/01/2015 Imaging   CT scan of the chest was performed due to chest pressure and it came back negative.     REVIEW OF SYSTEMS:   Constitutional: Denies fevers, chills or abnormal weight loss Eyes: Denies blurriness of vision Ears, nose, mouth, throat, and face: Denies mucositis or sore throat Respiratory: Denies cough, dyspnea or wheezes Cardiovascular: Denies palpitation, chest discomfort or lower extremity swelling Gastrointestinal:  Denies nausea, heartburn or change in bowel habits Skin: Denies abnormal skin rashes Lymphatics: Denies new lymphadenopathy or easy bruising Neurological:Denies numbness, tingling or new weaknesses Behavioral/Psych: Mood is stable, no new changes  All other systems were reviewed with the  patient and are negative.  I have reviewed the past medical history, past surgical history, social history and family history with the patient and they are unchanged from previous note.  ALLERGIES:  is allergic to ciprofloxacin, ofloxacin, and other.  MEDICATIONS:  Current Outpatient Medications  Medication Sig Dispense Refill  . acetaminophen (TYLENOL) 500 MG tablet Take 500 mg by  mouth every 6 (six) hours as needed.    . Blood Glucose Monitoring Suppl (ACCU-CHEK GUIDE ME) w/Device KIT 1 Act by Does not apply route in the morning and at bedtime. 1 kit 0  . Cholecalciferol 50 MCG (2000 UT) TABS Take 2 tablets (4,000 Units total) by mouth daily. 180 tablet 1  . Dapagliflozin-metFORMIN HCl ER (XIGDUO XR) 10-500 MG TB24 Take 1 tablet by mouth daily. 90 tablet 1  . indapamide (LOZOL) 1.25 MG tablet Take 1 tablet (1.25 mg total) by mouth daily. 90 tablet 0  . irbesartan (AVAPRO) 150 MG tablet Take 1 tablet (150 mg total) by mouth daily. 90 tablet 0  . meloxicam (MOBIC) 7.5 MG tablet Take 1 tablet (7.5 mg total) by mouth daily. 90 tablet 1  . rosuvastatin (CRESTOR) 20 MG tablet TAKE 1 TABLET(20 MG) BY MOUTH DAILY 90 tablet 1   No current facility-administered medications for this visit.    PHYSICAL EXAMINATION: ECOG PERFORMANCE STATUS: 0 - Asymptomatic  Vitals:   04/23/20 1207  BP: (!) 148/81  Pulse: 77  Resp: 18  Temp: 98.3 F (36.8 C)  SpO2: 100%   Filed Weights   04/23/20 1207  Weight: 153 lb 12.8 oz (69.8 kg)    GENERAL:alert, no distress and comfortable SKIN: skin color, texture, turgor are normal, no rashes or significant lesions EYES: normal, Conjunctiva are pink and non-injected, sclera clear OROPHARYNX:no exudate, no erythema and lips, buccal mucosa, and tongue normal  NECK: supple, thyroid normal size, non-tender, without nodularity LYMPH:  no palpable lymphadenopathy in the cervical, axillary or inguinal LUNGS: clear to auscultation and percussion with normal breathing effort HEART: regular rate & rhythm and no murmurs and no lower extremity edema ABDOMEN:abdomen soft, non-tender and normal bowel sounds Musculoskeletal:no cyanosis of digits and no clubbing  NEURO: alert & oriented x 3 with fluent speech, no focal motor/sensory deficits  LABORATORY DATA:  I have reviewed the data as listed    Component Value Date/Time   NA 142 04/22/2020 1308    NA 140 01/02/2016 1034   K 4.1 04/22/2020 1308   K 4.0 01/02/2016 1034   CL 104 04/22/2020 1308   CO2 28 04/22/2020 1308   CO2 27 01/02/2016 1034   GLUCOSE 119 (H) 04/22/2020 1308   GLUCOSE 99 01/02/2016 1034   BUN 15 04/22/2020 1308   BUN 9.0 01/02/2016 1034   CREATININE 0.95 04/22/2020 1308   CREATININE 0.8 01/02/2016 1034   CALCIUM 10.2 04/22/2020 1308   CALCIUM 9.6 01/02/2016 1034   PROT 7.8 04/22/2020 1308   PROT 7.8 01/02/2016 1034   ALBUMIN 4.4 04/22/2020 1308   ALBUMIN 4.1 01/02/2016 1034   AST 19 04/22/2020 1308   AST 17 01/02/2016 1034   ALT 19 04/22/2020 1308   ALT 17 01/02/2016 1034   ALKPHOS 65 04/22/2020 1308   ALKPHOS 76 01/02/2016 1034   BILITOT 0.7 04/22/2020 1308   BILITOT 0.45 01/02/2016 1034   GFRNONAA >60 04/22/2020 1308   GFRAA >60 04/22/2020 1308    No results found for: SPEP, UPEP  Lab Results  Component Value Date   WBC 5.9 04/22/2020  NEUTROABS 1.9 04/22/2020   HGB 13.6 04/22/2020   HCT 40.8 04/22/2020   MCV 88.7 04/22/2020   PLT 344 04/22/2020      Chemistry      Component Value Date/Time   NA 142 04/22/2020 1308   NA 140 01/02/2016 1034   K 4.1 04/22/2020 1308   K 4.0 01/02/2016 1034   CL 104 04/22/2020 1308   CO2 28 04/22/2020 1308   CO2 27 01/02/2016 1034   BUN 15 04/22/2020 1308   BUN 9.0 01/02/2016 1034   CREATININE 0.95 04/22/2020 1308   CREATININE 0.8 01/02/2016 1034      Component Value Date/Time   CALCIUM 10.2 04/22/2020 1308   CALCIUM 9.6 01/02/2016 1034   ALKPHOS 65 04/22/2020 1308   ALKPHOS 76 01/02/2016 1034   AST 19 04/22/2020 1308   AST 17 01/02/2016 1034   ALT 19 04/22/2020 1308   ALT 17 01/02/2016 1034   BILITOT 0.7 04/22/2020 1308   BILITOT 0.45 01/02/2016 1034       RADIOGRAPHIC STUDIES: I have reviewed multiple imaging studies with the patient I have personally reviewed the radiological images as listed and agreed with the findings in the report. DG Chest 2 View  Result Date: 04/12/2020 CLINICAL  DATA:  Chronic intrascapular pain; worsening. Hx of HTN, NHL. Ex smoker. bilateral posterior-lateral CW pain for several months EXAM: CHEST - 2 VIEW COMPARISON:  None. FINDINGS: Normal mediastinum and cardiac silhouette. Hyperinflated lungs. Chronic central bronchitic markings. Normal pulmonary vasculature. No effusion, infiltrate, or pneumothorax. IMPRESSION: Chronic bronchitic markings. No acute findings. Lungs hyperinflated. Electronically Signed   By: Suzy Bouchard M.D.   On: 04/12/2020 15:35   CT CHEST LUNG CA SCREEN LOW DOSE W/O CM  Result Date: 04/23/2020 CLINICAL DATA:  64 year old female with 45 pack year history of smoking. Lung cancer screening. EXAM: CT CHEST WITHOUT CONTRAST LOW-DOSE FOR LUNG CANCER SCREENING TECHNIQUE: Multidetector CT imaging of the chest was performed following the standard protocol without IV contrast. COMPARISON:  04/21/2019 FINDINGS: Cardiovascular: The heart size is normal. No substantial pericardial effusion. Coronary artery calcification is evident. Atherosclerotic calcification is noted in the wall of the thoracic aorta. Mediastinum/Nodes: No mediastinal lymphadenopathy. No evidence for gross hilar lymphadenopathy although assessment is limited by the lack of intravenous contrast on today's study. The esophagus has normal imaging features. There is no axillary lymphadenopathy. Lungs/Pleura: Centrilobular emphsyema noted. Stable 3.8 mm perifissural right upper lobe pulmonary nodule. No new suspicious pulmonary nodule or mass. No focal airspace consolidation. No pleural effusion. Upper Abdomen: Stable 2.8 cm right adrenal adenoma Musculoskeletal: No worrisome lytic or sclerotic osseous abnormality. IMPRESSION: 1. Lung-RADS 2, benign appearance or behavior. Continue annual screening with low-dose chest CT without contrast in 12 months. 2. Stable right adrenal adenoma. 3. Aortic Atherosclerosis (ICD10-I70.0) and Emphysema (ICD10-J43.9). Electronically Signed   By: Misty Stanley M.D.   On: 04/23/2020 08:20

## 2020-04-23 NOTE — Assessment & Plan Note (Signed)
We discussed risk factors for heart disease with smoking and diabetes She is attempting to make healthy dietary choices and exercise

## 2020-04-23 NOTE — Assessment & Plan Note (Signed)
The patient has smoked for over 30 years and is motivated to quit We reviewed CT imaging She appears to be interested to quit smoking; she is committed to smoking less than half a pack of cigarettes per day by next year or quit

## 2020-04-24 ENCOUNTER — Ambulatory Visit: Payer: Medicare Other | Admitting: Pharmacist

## 2020-04-24 ENCOUNTER — Encounter: Payer: Self-pay | Admitting: Internal Medicine

## 2020-04-24 DIAGNOSIS — E118 Type 2 diabetes mellitus with unspecified complications: Secondary | ICD-10-CM

## 2020-04-24 DIAGNOSIS — I10 Essential (primary) hypertension: Secondary | ICD-10-CM

## 2020-04-24 DIAGNOSIS — E785 Hyperlipidemia, unspecified: Secondary | ICD-10-CM

## 2020-04-24 NOTE — Chronic Care Management (AMB) (Signed)
Chronic Care Management Pharmacy  Name: Dashanae Longfield  MRN: [id number] DOB: 06-Aug-1956  Chief Complaint/ HPI  [place],  64 y.o. , female presents for their Follow-Up CCM visit with the clinical pharmacist via telephone due to COVID-19 Pandemic.  PCP : Janith Lima, MD  Their chronic conditions include: Hypertension, Hyperlipidemia, Diabetes, Osteoarthritis and Tobacco use  Lived here whole life, 42 of 13 kids, took care of 2 sick parents. Neck fused 2009, had to stop working, caused a lot of pain.  Multiple family members were on dialysis and pt is very concerned about kidney issues due to this.   Office Visits: 04/11/20 Dr Ronnald Ramp OV: back/chest wall pain - continue meloxicam.   02/08/20 Dr Ronnald Ramp OV: added irbesartan 150 mg (BP uncontrolled and DM worsening). Referred to nutritionist for DM.  01/30/20 Dr Ronnald Ramp OV: started Xigduo 10-500 mg for A1c up to 6.9 and BP benefits. Also started meloxicam for knee pain.  08/01/19 Dr Ronnald Ramp OV: c/o palpitations. EKG normal. Ordered 48-hr Holter monitor. Pt was not taking statin, asked to restart rosuvastatin.   Consult Visit: 04/23/20 Dr Alvy Bimler (heme/onc): discussed smoking cessation < 1ppd. No signs of cancer. No med changes.  03/14/20, 03/21/20 Alvester Morin RD: DM self-mgmt course, given many handouts.  09/12/19 ED visit: URI and arm pain. COVID negative. Rx'd flonase, benzonatate. Rec'd Tylenol/ibuprofen for arm pain.   Medications: Outpatient Encounter Medications as of 04/24/2020  Medication Sig  . acetaminophen (TYLENOL) 500 MG tablet Take 500 mg by mouth every 6 (six) hours as needed.  . Blood Glucose Monitoring Suppl (ACCU-CHEK GUIDE ME) w/Device KIT 1 Act by Does not apply route in the morning and at bedtime.  . Cholecalciferol 50 MCG (2000 UT) TABS Take 2 tablets (4,000 Units total) by mouth daily.  . indapamide (LOZOL) 1.25 MG tablet Take 1 tablet (1.25 mg total) by mouth daily.  . rosuvastatin (CRESTOR) 20 MG  tablet TAKE 1 TABLET(20 MG) BY MOUTH DAILY  . Dapagliflozin-metFORMIN HCl ER (XIGDUO XR) 10-500 MG TB24 Take 1 tablet by mouth daily. (Patient not taking: Reported on 04/24/2020)  . irbesartan (AVAPRO) 150 MG tablet Take 1 tablet (150 mg total) by mouth daily. (Patient not taking: Reported on 04/24/2020)  . meloxicam (MOBIC) 7.5 MG tablet Take 1 tablet (7.5 mg total) by mouth daily. (Patient not taking: Reported on 04/24/2020)   No facility-administered encounter medications on file as of 04/24/2020.     Current Diagnosis/Assessment:  SDOH Interventions     Most Recent Value  SDOH Interventions  Financial Strain Interventions Intervention Not Indicated      Goals Addressed            This Visit's Progress   . Pharmacy Care Plan   On track    CARE PLAN ENTRY (see longitudinal plan of care for additional care plan information)  Current Barriers:  . Chronic Disease Management support, education, and care coordination needs related to Hypertension, Hyperlipidemia, and Diabetes   Hypertension BP Readings from Last 3 Encounters:  04/11/20 (!) 152/82  02/08/20 (!) 150/90  01/30/20 (!) 156/88 .  Pharmacist Clinical Goal(s): o Over the next 30 days, patient will work with PharmD and providers to achieve BP goal <130/80 . Current regimen:  o Indapamide 1.25 mg daily AM o Irbesartan 150 mg daily . Interventions: o Discussed BP goals and benefits of medication for prevention of heart attack / stroke o Discussed benefits of irbesartan 150 mg for prevention of kidney damage . Patient self  care activities - Over the next 30 days, patient will: o Check BP daily, document, and provide at future appointments o Ensure daily salt intake < 2300 mg/day o Take irbesartan 150 mg in addition to indapamide 1.25 mg  Hyperlipidemia Lab Results  Component Value Date/Time   LDLCALC 72 01/30/2020 04:34 PM   LDLDIRECT 150.8 10/19/2011 01:36 PM .  Pharmacist Clinical Goal(s): o Over the next 90  days, patient will work with PharmD and providers to maintain LDL goal < 100 . Current regimen:  o Rosuvastatin 20 mg daily . Interventions: o Discussed cholesterol goals and benefits of medication for prevention of heart attack / stroke o Discussed high blood pressure is a leading cause of kidney damage . Patient self care activities - Over the next 90 days, patient will: o Continue medication as prescribed  Diabetes Lab Results  Component Value Date/Time   HGBA1C 6.9 (H) 01/30/2020 04:34 PM   HGBA1C 6.7 (H) 08/01/2019 11:18 AM .  Pharmacist Clinical Goal(s): o Over the next 30 days, patient will work with PharmD and providers to maintain A1c goal <7% and blood sugar < 130 . Current regimen:  o Xigduo 10-500 mg daily . Interventions: o Discussed BG goals and benefits of metformin + Farxiga (dapagliflozin), including prevention of heart and kidney disease o Discussed high blood sugar is a leading cause of kidney damage . Patient self care activities - Over the next 30 days, patient will: o Check blood sugar once daily and in the morning before eating or drinking, document, and provide at future appointments o Contact provider with any episodes of hypoglycemia o Take Xigduo with breakfast  Medication management . Pharmacist Clinical Goal(s): o Over the next 30 days, patient will work with PharmD and providers to maintain optimal medication adherence . Current pharmacy: [facility] . Interventions o Comprehensive medication review performed. o Utilize [facility] pharmacy for medication synchronization, packaging and delivery . Patient self care activities - Over the next 30 days, patient will: o Focus on medication adherence by fill date o Take medications as prescribed o Report any questions or concerns to PharmD and/or provider(s)  Please see past updates related to this goal by clicking on the "Past Updates" button in the selected goal        Hypertension   BP goal <  130/80  Office blood pressures are  BP Readings from Last 3 Encounters:  04/23/20 (!) 148/81  04/11/20 (!) 152/82  02/08/20 (!) 150/90   Kidney Function Lab Results  Component Value Date/Time   CREATININE 0.95 04/22/2020 01:08 PM   CREATININE 0.88 01/30/2020 04:34 PM   CREATININE 0.8 01/02/2016 10:34 AM   CREATININE 0.8 12/18/2014 11:12 AM   GFR 78.39 01/30/2020 04:34 PM   GFRNONAA >60 04/22/2020 01:08 PM   GFRAA >60 04/22/2020 01:08 PM   K 4.1 04/22/2020 01:08 PM   K 3.9 01/30/2020 04:34 PM   K 4.0 01/02/2016 10:34 AM   K 4.1 12/18/2014 11:12 AM   Patient checks BP at home 3-5x per week  Patient home BP readings are ranging: 130s/80s  Patient has failed these meds in the past: chlorthalidone, azilsartan, telmisartan Patient is currently controlled on the following medications:   Indapamide 1.25 mg daily AM  Irbesartan 150 mg daily   We discussed: Pt never started taking irbesartan, she read about side effects and worried about effect on kidneys; discussed kidney dysfunction at length, including long term benefit of ARB and effect of high BP itself on kidney function. Pt  agreed to start taking irbesartan and will continue to monitor BP.  Plan  Continue current medications and control with diet and exercise  Start irbesartan 150 mg as directed by PCP   Hyperlipidemia   LDL goal < 100  Lipid Panel     Component Value Date/Time   CHOL 128 01/30/2020 1634   TRIG 47.0 01/30/2020 1634   HDL 47.20 01/30/2020 1634   CHOLHDL 3 01/30/2020 1634   VLDL 9.4 01/30/2020 1634   LDLCALC 72 01/30/2020 1634   LDLDIRECT 150.8 10/19/2011 1336   The ASCVD Risk score (Goff DC Jr., et al., 2013) failed to calculate for the following reasons:   The valid total cholesterol range is 130 to 320 mg/dL   Patient has failed these meds in past: atorvastatin, lovastatin (no statin allergy listed) Patient is currently controlled on the following medications:   rosuvastatin 20 mg HS  We  discussed:  diet and exercise extensively , types of cholesterol, role of cholesterol in ASCVD, benefits of statin. Pt denies issues with statin, she is taking it nightly. Pt reports she is working on wt loss, has lost 7 lbs.  Plan  Continue current medications and control with diet and exercise   Diabetes   A1c goal <7%  Recent Relevant Labs: Lab Results  Component Value Date/Time   HGBA1C 6.9 (H) 01/30/2020 04:34 PM   HGBA1C 6.7 (H) 08/01/2019 11:18 AM   GFR 78.39 01/30/2020 04:34 PM   GFR 98.97 08/01/2019 11:18 AM   MICROALBUR 1.4 01/30/2020 04:34 PM   MICROALBUR 0.9 12/20/2018 11:38 AM    Last diabetic Eye exam:  Lab Results  Component Value Date/Time   HMDIABEYEEXA No Retinopathy 02/14/2020 12:00 AM    Last diabetic Foot exam: No results found for: HMDIABFOOTEX   Checking BG: Daily Recent FBG Readings: 144  Patient has failed these meds in past: none Patient is currently controlled on the following medications:  Xigduo (dapagliflozin-metformin) 10-500 mg once daily (not taking)  We discussed: diet and exercise extensively - pt attended a nutrition class at [facility] and does not think it helped much, [facility] She also walks 3 miles every day and has lost 7 lbs. Pt never started taking Xigduo after finding out it had metformin in it - she is worried about metformin recalls. Discussed metformin recalls at length, benefits of SGLT2 and metformin for heart and kidney protection; pt agreed to start taking Xigduo  Plan  Continue current medications and control with diet and exercise  Start Xigduo 10-500 mg with breakfast as directed by PCP  Health Maintenance   Lab Results  Component Value Date/Time   VD25OH 21.41 (L) 04/05/2019 11:30 AM   VD25OH 17.58 (L) 12/20/2018 11:38 AM   Patient is currently controlled on the following medications:   Vitamin D 2000 IU daily  Multivitamin  We discussed:  Patient is satisfied with current OTC regimen  and denies issues.   Plan  Continue current medications  Medication Management   Pt uses [facility] pharmacy for all medications 90-day vials Pt endorses 100% compliance  We discussed: Reviewed patient's [facility] medication and Epic medication profile assuring there are no discrepancies or gaps in therapy. Confirmed all fill dates appropriate and verified with patient that there is a sufficient quantity of all prescribed medications at home. Informed patient to call me any time if needing medications before scheduled deliveries. The anticipated medication sync date is 05/03/20.  Plan  Utilize [facility] pharmacy for medication  synchronization, packaging and delivery  Next adherence delivery scheduled for 05/02/20 for the following meds: Indapamide 1.25 mg daily Rosuvastatin 20 mg daily Accu-Chek Guide test strips Accu-Chek Softclix lancets   Follow up: 2 week phone visit  Charlene Brooke, PharmD Clinical Pharmacist [facility] [phone]

## 2020-04-24 NOTE — Patient Instructions (Addendum)
Visit Information  Phone number for Pharmacist: [phone]  Goals Addressed            This Visit's Progress   . Pharmacy Care Plan   On track    CARE PLAN ENTRY (see longitudinal plan of care for additional care plan information)  Current Barriers:  . Chronic Disease Management support, education, and care coordination needs related to Hypertension, Hyperlipidemia, and Diabetes   Hypertension BP Readings from Last 3 Encounters:  04/11/20 (!) 152/82  02/08/20 (!) 150/90  01/30/20 (!) 156/88 .  Pharmacist Clinical Goal(s): o Over the next 30 days, patient will work with PharmD and providers to achieve BP goal <130/80 . Current regimen:  o Indapamide 1.25 mg daily AM o Irbesartan 150 mg daily . Interventions: o Discussed BP goals and benefits of medication for prevention of heart attack / stroke o Discussed benefits of irbesartan 150 mg for prevention of kidney damage . Patient self care activities - Over the next 30 days, patient will: o Check BP daily, document, and provide at future appointments o Ensure daily salt intake < 2300 mg/day o Take irbesartan 150 mg in addition to indapamide 1.25 mg  Hyperlipidemia Lab Results  Component Value Date/Time   LDLCALC 72 01/30/2020 04:34 PM   LDLDIRECT 150.8 10/19/2011 01:36 PM .  Pharmacist Clinical Goal(s): o Over the next 90 days, patient will work with PharmD and providers to maintain LDL goal < 100 . Current regimen:  o Rosuvastatin 20 mg daily . Interventions: o Discussed cholesterol goals and benefits of medication for prevention of heart attack / stroke o Discussed high blood pressure is a leading cause of kidney damage . Patient self care activities - Over the next 90 days, patient will: o Continue medication as prescribed  Diabetes Lab Results  Component Value Date/Time   HGBA1C 6.9 (H) 01/30/2020 04:34 PM   HGBA1C 6.7 (H) 08/01/2019 11:18 AM .  Pharmacist Clinical Goal(s): o Over the next 30 days, patient  will work with PharmD and providers to maintain A1c goal <7% and blood sugar < 130 . Current regimen:  o Xigduo 10-500 mg daily . Interventions: o Discussed BG goals and benefits of metformin + Farxiga (dapagliflozin), including prevention of heart and kidney disease o Discussed high blood sugar is a leading cause of kidney damage . Patient self care activities - Over the next 30 days, patient will: o Check blood sugar once daily and in the morning before eating or drinking, document, and provide at future appointments o Contact provider with any episodes of hypoglycemia o Take Xigduo with breakfast  Medication management . Pharmacist Clinical Goal(s): o Over the next 30 days, patient will work with PharmD and providers to maintain optimal medication adherence . Current pharmacy: [facility] . Interventions o Comprehensive medication review performed. o Utilize [facility] pharmacy for medication synchronization, packaging and delivery . Patient self care activities - Over the next 30 days, patient will: o Focus on medication adherence by fill date o Take medications as prescribed o Report any questions or concerns to PharmD and/or provider(s)  Please see past updates related to this goal by clicking on the "Past Updates" button in the selected goal       Patient verbalizes understanding of instructions provided today.   Telephone follow up appointment with pharmacy team member scheduled for: 2 weeks  Charlene Brooke, PharmD Clinical Pharmacist [facility] [phone]  Diabetes Mellitus and Nutrition, Adult When you have diabetes (diabetes mellitus), it is very  important to have healthy eating habits because your blood sugar (glucose) levels are greatly affected by what you eat and drink. Eating healthy foods in the appropriate amounts, at about the same times every day, can help you:  Control your blood glucose.  Lower your risk of heart  disease.  Improve your blood pressure.  Reach or maintain a healthy weight. Every person with diabetes is different, and each person has different needs for a meal plan. Your health care provider may recommend that you work with a diet and nutrition specialist (dietitian) to make a meal plan that is best for you. Your meal plan may vary depending on factors such as:  The calories you need.  The medicines you take.  Your weight.  Your blood glucose, blood pressure, and cholesterol levels.  Your activity level.  Other health conditions you have, such as heart or kidney disease. How do carbohydrates affect me? Carbohydrates, also called carbs, affect your blood glucose level more than any other type of food. Eating carbs naturally raises the amount of glucose in your blood. Carb counting is a method for keeping track of how many carbs you eat. Counting carbs is important to keep your blood glucose at a healthy level, especially if you use insulin or take certain oral diabetes medicines. It is important to know how many carbs you can safely have in each meal. This is different for every person. Your dietitian can help you calculate how many carbs you should have at each meal and for each snack. Foods that contain carbs include:  Bread, cereal, rice, pasta, and crackers.  Potatoes and corn.  Peas, beans, and lentils.  Milk and yogurt.  Fruit and juice.  Desserts, such as cakes, cookies, ice cream, and candy. How does alcohol affect me? Alcohol can cause a sudden decrease in blood glucose (hypoglycemia), especially if you use insulin or take certain oral diabetes medicines. Hypoglycemia can be a life-threatening condition. Symptoms of hypoglycemia (sleepiness, dizziness, and confusion) are similar to symptoms of having too much alcohol. If your health care provider says that alcohol is safe for you, follow these guidelines:  Limit alcohol intake to no more than 1 drink per day for  nonpregnant women and 2 drinks per day for men. One drink equals 12 oz of beer, 5 oz of wine, or 1 oz of hard liquor.  Do not drink on an empty stomach.  Keep yourself hydrated with water, diet soda, or unsweetened iced tea.  Keep in mind that regular soda, juice, and other mixers may contain a lot of sugar and must be counted as carbs. What are tips for following this plan?  Reading food labels  Start by checking the serving size on the "Nutrition Facts" label of packaged foods and drinks. The amount of calories, carbs, fats, and other nutrients listed on the label is based on one serving of the item. Many items contain more than one serving per package.  Check the total grams (g) of carbs in one serving. You can calculate the number of servings of carbs in one serving by dividing the total carbs by 15. For example, if a food has 30 g of total carbs, it would be equal to 2 servings of carbs.  Check the number of grams (g) of saturated and trans fats in one serving. Choose foods that have low or no amount of these fats.  Check the number of milligrams (mg) of salt (sodium) in one serving. Most people should limit total  sodium intake to less than 2,300 mg per day.  Always check the nutrition information of foods labeled as "low-fat" or "nonfat". These foods may be higher in added sugar or refined carbs and should be avoided.  Talk to your dietitian to identify your daily goals for nutrients listed on the label. Shopping  Avoid buying canned, premade, or processed foods. These foods tend to be high in fat, sodium, and added sugar.  Shop around the outside edge of the grocery store. This includes fresh fruits and vegetables, bulk grains, fresh meats, and fresh dairy. Cooking  Use low-heat cooking methods, such as baking, instead of high-heat cooking methods like deep frying.  Cook using healthy oils, such as olive, canola, or sunflower oil.  Avoid cooking with butter, cream, or  high-fat meats. Meal planning  Eat meals and snacks regularly, preferably at the same times every day. Avoid going long periods of time without eating.  Eat foods high in fiber, such as fresh fruits, vegetables, beans, and whole grains. Talk to your dietitian about how many servings of carbs you can eat at each meal.  Eat 4-6 ounces (oz) of lean protein each day, such as lean meat, chicken, fish, eggs, or tofu. One oz of lean protein is equal to: ? 1 oz of meat, chicken, or fish. ? 1 egg. ?  cup of tofu.  Eat some foods each day that contain healthy fats, such as avocado, nuts, seeds, and fish. Lifestyle  Check your blood glucose regularly.  Exercise regularly as told by your health care provider. This may include: ? 150 minutes of moderate-intensity or vigorous-intensity exercise each week. This could be brisk walking, biking, or water aerobics. ? Stretching and doing strength exercises, such as yoga or weightlifting, at least 2 times a week.  Take medicines as told by your health care provider.  Do not use any products that contain nicotine or tobacco, such as cigarettes and e-cigarettes. If you need help quitting, ask your health care provider.  Work with a Social worker or diabetes educator to identify strategies to manage stress and any emotional and social challenges. Questions to ask a health care provider  Do I need to meet with a diabetes educator?  Do I need to meet with a dietitian?  What number can I call if I have questions?  When are the best times to check my blood glucose? Where to find more information:  American Diabetes Association: diabetes.org  Academy of Nutrition and Dietetics: www.eatright.[employer] of Diabetes and Digestive and Kidney Diseases (NIH): [employer] Summary  A healthy meal plan will help you control your blood glucose and maintain a healthy lifestyle.  Working with a diet and nutrition specialist (dietitian) can help  you make a meal plan that is best for you.  Keep in mind that carbohydrates (carbs) and alcohol have immediate effects on your blood glucose levels. It is important to count carbs and to use alcohol carefully. This information is not intended to replace advice given to you by your health care provider. Make sure you discuss any questions you have with your health care provider. Document Revised: 09/03/2017 Document Reviewed: 10/26/2016 Elsevier Patient Education  2020 [employer].

## 2020-04-26 MED ORDER — INDAPAMIDE 1.25 MG PO TABS: 1.2500 mg | ORAL_TABLET | Freq: Every day | ORAL | 1 refills | Status: DC

## 2020-04-26 MED ORDER — ACCU-CHEK SOFTCLIX LANCETS MISC: 3 refills | Status: AC

## 2020-04-26 MED ORDER — ACCU-CHEK GUIDE VI STRP: ORAL_STRIP | 3 refills | Status: DC

## 2020-04-26 NOTE — Addendum Note (Signed)
Addended by: Aviva Signs M on: 04/26/2020 11:09 AM   Modules accepted: Orders

## 2020-05-08 ENCOUNTER — Ambulatory Visit: Payer: Medicare Other | Admitting: Pharmacist

## 2020-05-08 ENCOUNTER — Other Ambulatory Visit: Payer: Self-pay

## 2020-05-08 DIAGNOSIS — E785 Hyperlipidemia, unspecified: Secondary | ICD-10-CM

## 2020-05-08 DIAGNOSIS — I10 Essential (primary) hypertension: Secondary | ICD-10-CM

## 2020-05-08 DIAGNOSIS — E118 Type 2 diabetes mellitus with unspecified complications: Secondary | ICD-10-CM

## 2020-05-08 NOTE — Chronic Care Management (AMB) (Signed)
Chronic Care Management Pharmacy  Name: Lauren Orozco  MRN: [id number] DOB: 12/21/1955  Chief Complaint/ HPI  [place],  64 y.o. , female presents for their Follow-Up CCM visit with the clinical pharmacist via telephone due to COVID-19 Pandemic.  PCP : Janith Lima, MD  Their chronic conditions include: Hypertension, Hyperlipidemia, Diabetes, Osteoarthritis and Tobacco use  Lived here whole life, 17 of 13 kids, took care of 2 sick parents. Neck fused 2009, had to stop working, caused a lot of pain.  Multiple family members were on dialysis and pt is very concerned about kidney issues due to this.    Office Visits: 04/11/20 Dr Ronnald Ramp OV: back/chest wall pain - continue meloxicam.   02/08/20 Dr Ronnald Ramp OV: added irbesartan 150 mg (BP uncontrolled and DM worsening). Referred to nutritionist for DM.  01/30/20 Dr Ronnald Ramp OV: started Xigduo 10-500 mg for A1c up to 6.9 and BP benefits. Also started meloxicam for knee pain.  08/01/19 Dr Ronnald Ramp OV: c/o palpitations. EKG normal. Ordered 48-hr Holter monitor. Pt was not taking statin, asked to restart rosuvastatin.   Consult Visit: 04/23/20 Dr Alvy Bimler (heme/onc): discussed smoking cessation < 1ppd. No signs of cancer. No med changes.  03/14/20, 03/21/20 Alvester Morin RD: DM self-mgmt course, given many handouts.  09/12/19 ED visit: URI and arm pain. COVID negative. Rx'd flonase, benzonatate. Rec'd Tylenol/ibuprofen for arm pain.   Medications: Outpatient Encounter Medications as of 05/08/2020  Medication Sig  . Accu-Chek Softclix Lancets lancets Use to check blood sugar daily. DX: E11.9  . acetaminophen (TYLENOL) 500 MG tablet Take 500 mg by mouth every 6 (six) hours as needed.  . Blood Glucose Monitoring Suppl (ACCU-CHEK GUIDE ME) w/Device KIT 1 Act by Does not apply route in the morning and at bedtime.  . Cholecalciferol 50 MCG (2000 UT) TABS Take 2 tablets (4,000 Units total) by mouth daily.  . Dapagliflozin-metFORMIN HCl ER  (XIGDUO XR) 10-500 MG TB24 Take 1 tablet by mouth daily.  Marland Kitchen glucose blood (ACCU-CHEK GUIDE) test strip Use to check blood sugar daily. DX: E11.9  . indapamide (LOZOL) 1.25 MG tablet Take 1 tablet (1.25 mg total) by mouth daily.  . irbesartan (AVAPRO) 150 MG tablet Take 1 tablet (150 mg total) by mouth daily.  . meloxicam (MOBIC) 7.5 MG tablet Take 1 tablet (7.5 mg total) by mouth daily.  . rosuvastatin (CRESTOR) 20 MG tablet TAKE 1 TABLET(20 MG) BY MOUTH DAILY   No facility-administered encounter medications on file as of 05/08/2020.     Current Diagnosis/Assessment:  SDOH Interventions     Most Recent Value  SDOH Interventions  Physical Activity Interventions Intervention Not Indicated       Goals Addressed            This Visit's Progress   . Pharmacy Care Plan   On track    CARE PLAN ENTRY (see longitudinal plan of care for additional care plan information)  Current Barriers:  . Chronic Disease Management support, education, and care coordination needs related to Hypertension, Hyperlipidemia, and Diabetes   Hypertension BP Readings from Last 3 Encounters:  04/11/20 (!) 152/82  02/08/20 (!) 150/90  01/30/20 (!) 156/88 .  Pharmacist Clinical Goal(s): o Over the next 180 days, patient will work with PharmD and providers to achieve BP goal <130/80 . Current regimen:  o Indapamide 1.25 mg daily AM o Irbesartan 150 mg daily . Interventions: o Discussed BP goals and benefits of medication for prevention of heart attack / stroke o  Discussed benefits of irbesartan 150 mg for prevention of kidney damage . Patient self care activities - Over the next 180 days, patient will: o Check BP daily, document, and provide at future appointments o Ensure daily salt intake < 2300 mg/day o Take irbesartan 150 mg in addition to indapamide 1.25 mg  Hyperlipidemia Lab Results  Component Value Date/Time   LDLCALC 72 01/30/2020 04:34 PM   LDLDIRECT 150.8 10/19/2011 01:36 PM .  Pharmacist  Clinical Goal(s): o Over the next 180 days, patient will work with PharmD and providers to maintain LDL goal < 100 . Current regimen:  o Rosuvastatin 20 mg daily . Interventions: o Discussed cholesterol goals and benefits of medication for prevention of heart attack / stroke o Discussed high blood pressure is a leading cause of kidney damage . Patient self care activities - Over the next 180 days, patient will: o Continue medication as prescribed  Diabetes Lab Results  Component Value Date/Time   HGBA1C 6.9 (H) 01/30/2020 04:34 PM   HGBA1C 6.7 (H) 08/01/2019 11:18 AM .  Pharmacist Clinical Goal(s): o Over the next 180 days, patient will work with PharmD and providers to maintain A1c goal <7% and blood sugar < 130 . Current regimen:  o Xigduo 10-500 mg daily . Interventions: o Discussed BG goals and benefits of metformin + Farxiga (dapagliflozin), including prevention of heart and kidney disease o Discussed high blood sugar is a leading cause of kidney damage . Patient self care activities - Over the next 180 days, patient will: o Check blood sugar once daily and in the morning before eating or drinking, document, and provide at future appointments o Contact provider with any episodes of hypoglycemia o Take medication as prescribed  Medication management . Pharmacist Clinical Goal(s): o Over the next 180 days, patient will work with PharmD and providers to maintain optimal medication adherence . Current pharmacy: [facility] . Interventions o Comprehensive medication review performed. o Utilize [facility] pharmacy for medication synchronization, packaging and delivery . Patient self care activities - Over the next 180 days, patient will: o Focus on medication adherence by fill date o Take medications as prescribed o Report any questions or concerns to PharmD and/or provider(s)  Please see past updates related to this goal by clicking on the "Past Updates" button in the selected goal         Hypertension   BP goal < 130/80  Office blood pressures are  BP Readings from Last 3 Encounters:  04/23/20 (!) 148/81  04/11/20 (!) 152/82  02/08/20 (!) 150/90   Kidney Function Lab Results  Component Value Date/Time   CREATININE 0.95 04/22/2020 01:08 PM   CREATININE 0.88 01/30/2020 04:34 PM   CREATININE 0.8 01/02/2016 10:34 AM   CREATININE 0.8 12/18/2014 11:12 AM   GFR 78.39 01/30/2020 04:34 PM   GFRNONAA >60 04/22/2020 01:08 PM   GFRAA >60 04/22/2020 01:08 PM   K 4.1 04/22/2020 01:08 PM   K 3.9 01/30/2020 04:34 PM   K 4.0 01/02/2016 10:34 AM   K 4.1 12/18/2014 11:12 AM   Patient checks BP at home daily Patient home BP readings are ranging: 144/100 >> 131/84 recheck; typically 120s/80s  Patient has failed these meds in the past: chlorthalidone, azilsartan, telmisartan Patient is currently controlled on the following medications:   Indapamide 1.25 mg daily AM  Irbesartan 150 mg daily   We discussed: Pt started taking irbesartan 2 weeks ago, BP has been mainly controlled < 130/80; pt denies side effects or issues; pt  reports she is making an effort to improve diet, limit salt, and exercise daily  Plan  Continue current medications and control with diet and exercise    Hyperlipidemia   LDL goal < 100  Lipid Panel     Component Value Date/Time   CHOL 128 01/30/2020 1634   TRIG 47.0 01/30/2020 1634   HDL 47.20 01/30/2020 1634   CHOLHDL 3 01/30/2020 1634   VLDL 9.4 01/30/2020 1634   LDLCALC 72 01/30/2020 1634   LDLDIRECT 150.8 10/19/2011 1336   The ASCVD Risk score (Goff DC Jr., et al., 2013) failed to calculate for the following reasons:   The valid total cholesterol range is 130 to 320 mg/dL   Patient has failed these meds in past: atorvastatin, lovastatin (no statin allergy listed) Patient is currently controlled on the following medications:   rosuvastatin 20 mg HS  We discussed:  diet and exercise extensively  Plan  Continue current  medications and control with diet and exercise   Diabetes   A1c goal <7%  Recent Relevant Labs: Lab Results  Component Value Date/Time   HGBA1C 6.9 (H) 01/30/2020 04:34 PM   HGBA1C 6.7 (H) 08/01/2019 11:18 AM   GFR 78.39 01/30/2020 04:34 PM   GFR 98.97 08/01/2019 11:18 AM   MICROALBUR 1.4 01/30/2020 04:34 PM   MICROALBUR 0.9 12/20/2018 11:38 AM    Last diabetic Eye exam:  Lab Results  Component Value Date/Time   HMDIABEYEEXA No Retinopathy 02/14/2020 12:00 AM    Last diabetic Foot exam: No results found for: HMDIABFOOTEX   Checking BG: Daily Recent FBG Readings: 110-115  Patient has failed these meds in past: none Patient is currently controlled on the following medications:  Xigduo (dapagliflozin-metformin) 10-500 mg once daily (not taking)  We discussed: Pt started taking Xigduo 2 weeks ago; fasting BG has improved from 140s to 110-115; pt denies side effects or issues; pt is working to increase water intake and limit sweets/carbs; she is also exercising daily.  Plan  Continue current medications and control with diet and exercise    Medication Management   Pt uses [facility] for all medications 90-day vials Pt endorses 100% compliance  We discussed: Reviewed patient's [facility] medication and Epic medication profile assuring there are no discrepancies or gaps in therapy. Confirmed all fill dates appropriate and verified with patient that there is a sufficient quantity of all prescribed medications at home. Informed patient to call me any time if needing medications before scheduled deliveries.   Plan  Utilize [facility] pharmacy for medication synchronization, packaging and delivery  Follow up: 6 month phone visit  Charlene Brooke, PharmD Clinical Pharmacist [facility] [phone]

## 2020-05-08 NOTE — Patient Instructions (Addendum)
Visit Information  Phone number for Pharmacist: [phone]  Goals Addressed            This Visit's Progress   . Pharmacy Care Plan   On track    CARE PLAN ENTRY (see longitudinal plan of care for additional care plan information)  Current Barriers:  . Chronic Disease Management support, education, and care coordination needs related to Hypertension, Hyperlipidemia, and Diabetes   Hypertension BP Readings from Last 3 Encounters:  04/11/20 (!) 152/82  02/08/20 (!) 150/90  01/30/20 (!) 156/88 .  Pharmacist Clinical Goal(s): o Over the next 180 days, patient will work with PharmD and providers to achieve BP goal <130/80 . Current regimen:  o Indapamide 1.25 mg daily AM o Irbesartan 150 mg daily . Interventions: o Discussed BP goals and benefits of medication for prevention of heart attack / stroke o Discussed benefits of irbesartan 150 mg for prevention of kidney damage . Patient self care activities - Over the next 180 days, patient will: o Check BP daily, document, and provide at future appointments o Ensure daily salt intake < 2300 mg/day o Take irbesartan 150 mg in addition to indapamide 1.25 mg  Hyperlipidemia Lab Results  Component Value Date/Time   LDLCALC 72 01/30/2020 04:34 PM   LDLDIRECT 150.8 10/19/2011 01:36 PM .  Pharmacist Clinical Goal(s): o Over the next 180 days, patient will work with PharmD and providers to maintain LDL goal < 100 . Current regimen:  o Rosuvastatin 20 mg daily . Interventions: o Discussed cholesterol goals and benefits of medication for prevention of heart attack / stroke o Discussed high blood pressure is a leading cause of kidney damage . Patient self care activities - Over the next 180 days, patient will: o Continue medication as prescribed  Diabetes Lab Results  Component Value Date/Time   HGBA1C 6.9 (H) 01/30/2020 04:34 PM   HGBA1C 6.7 (H) 08/01/2019 11:18 AM .  Pharmacist Clinical Goal(s): o Over the next 180 days,  patient will work with PharmD and providers to maintain A1c goal <7% and blood sugar < 130 . Current regimen:  o Xigduo 10-500 mg daily . Interventions: o Discussed BG goals and benefits of metformin + Farxiga (dapagliflozin), including prevention of heart and kidney disease o Discussed high blood sugar is a leading cause of kidney damage . Patient self care activities - Over the next 180 days, patient will: o Check blood sugar once daily and in the morning before eating or drinking, document, and provide at future appointments o Contact provider with any episodes of hypoglycemia o Take medication as prescribed  Medication management . Pharmacist Clinical Goal(s): o Over the next 180 days, patient will work with PharmD and providers to maintain optimal medication adherence . Current pharmacy: [facility] . Interventions o Comprehensive medication review performed. o Utilize [facility] pharmacy for medication synchronization, packaging and delivery . Patient self care activities - Over the next 180 days, patient will: o Focus on medication adherence by fill date o Take medications as prescribed o Report any questions or concerns to PharmD and/or provider(s)  Please see past updates related to this goal by clicking on the "Past Updates" button in the selected goal       Patient verbalizes understanding of instructions provided today.   Telephone follow up appointment with pharmacy team member scheduled for: 6 months  Charlene Brooke, PharmD Clinical Pharmacist [facility] [phone]  Diabetes Mellitus and Nutrition, Adult When you have diabetes (diabetes mellitus), it is very  important to have healthy eating habits because your blood sugar (glucose) levels are greatly affected by what you eat and drink. Eating healthy foods in the appropriate amounts, at about the same times every day, can help you:  Control your blood glucose.  Lower your risk of heart  disease.  Improve your blood pressure.  Reach or maintain a healthy weight. Every person with diabetes is different, and each person has different needs for a meal plan. Your health care provider may recommend that you work with a diet and nutrition specialist (dietitian) to make a meal plan that is best for you. Your meal plan may vary depending on factors such as:  The calories you need.  The medicines you take.  Your weight.  Your blood glucose, blood pressure, and cholesterol levels.  Your activity level.  Other health conditions you have, such as heart or kidney disease. How do carbohydrates affect me? Carbohydrates, also called carbs, affect your blood glucose level more than any other type of food. Eating carbs naturally raises the amount of glucose in your blood. Carb counting is a method for keeping track of how many carbs you eat. Counting carbs is important to keep your blood glucose at a healthy level, especially if you use insulin or take certain oral diabetes medicines. It is important to know how many carbs you can safely have in each meal. This is different for every person. Your dietitian can help you calculate how many carbs you should have at each meal and for each snack. Foods that contain carbs include:  Bread, cereal, rice, pasta, and crackers.  Potatoes and corn.  Peas, beans, and lentils.  Milk and yogurt.  Fruit and juice.  Desserts, such as cakes, cookies, ice cream, and candy. How does alcohol affect me? Alcohol can cause a sudden decrease in blood glucose (hypoglycemia), especially if you use insulin or take certain oral diabetes medicines. Hypoglycemia can be a life-threatening condition. Symptoms of hypoglycemia (sleepiness, dizziness, and confusion) are similar to symptoms of having too much alcohol. If your health care provider says that alcohol is safe for you, follow these guidelines:  Limit alcohol intake to no more than 1 drink per day for  nonpregnant women and 2 drinks per day for men. One drink equals 12 oz of beer, 5 oz of wine, or 1 oz of hard liquor.  Do not drink on an empty stomach.  Keep yourself hydrated with water, diet soda, or unsweetened iced tea.  Keep in mind that regular soda, juice, and other mixers may contain a lot of sugar and must be counted as carbs. What are tips for following this plan?  Reading food labels  Start by checking the serving size on the "Nutrition Facts" label of packaged foods and drinks. The amount of calories, carbs, fats, and other nutrients listed on the label is based on one serving of the item. Many items contain more than one serving per package.  Check the total grams (g) of carbs in one serving. You can calculate the number of servings of carbs in one serving by dividing the total carbs by 15. For example, if a food has 30 g of total carbs, it would be equal to 2 servings of carbs.  Check the number of grams (g) of saturated and trans fats in one serving. Choose foods that have low or no amount of these fats.  Check the number of milligrams (mg) of salt (sodium) in one serving. Most people should limit total  sodium intake to less than 2,300 mg per day.  Always check the nutrition information of foods labeled as "low-fat" or "nonfat". These foods may be higher in added sugar or refined carbs and should be avoided.  Talk to your dietitian to identify your daily goals for nutrients listed on the label. Shopping  Avoid buying canned, premade, or processed foods. These foods tend to be high in fat, sodium, and added sugar.  Shop around the outside edge of the grocery store. This includes fresh fruits and vegetables, bulk grains, fresh meats, and fresh dairy. Cooking  Use low-heat cooking methods, such as baking, instead of high-heat cooking methods like deep frying.  Cook using healthy oils, such as olive, canola, or sunflower oil.  Avoid cooking with butter, cream, or  high-fat meats. Meal planning  Eat meals and snacks regularly, preferably at the same times every day. Avoid going long periods of time without eating.  Eat foods high in fiber, such as fresh fruits, vegetables, beans, and whole grains. Talk to your dietitian about how many servings of carbs you can eat at each meal.  Eat 4-6 ounces (oz) of lean protein each day, such as lean meat, chicken, fish, eggs, or tofu. One oz of lean protein is equal to: ? 1 oz of meat, chicken, or fish. ? 1 egg. ?  cup of tofu.  Eat some foods each day that contain healthy fats, such as avocado, nuts, seeds, and fish. Lifestyle  Check your blood glucose regularly.  Exercise regularly as told by your health care provider. This may include: ? 150 minutes of moderate-intensity or vigorous-intensity exercise each week. This could be brisk walking, biking, or water aerobics. ? Stretching and doing strength exercises, such as yoga or weightlifting, at least 2 times a week.  Take medicines as told by your health care provider.  Do not use any products that contain nicotine or tobacco, such as cigarettes and e-cigarettes. If you need help quitting, ask your health care provider.  Work with a Social worker or diabetes educator to identify strategies to manage stress and any emotional and social challenges. Questions to ask a health care provider  Do I need to meet with a diabetes educator?  Do I need to meet with a dietitian?  What number can I call if I have questions?  When are the best times to check my blood glucose? Where to find more information:  American Diabetes Association: diabetes.org  Academy of Nutrition and Dietetics: www.eatright.[employer] of Diabetes and Digestive and Kidney Diseases (NIH): [employer] Summary  A healthy meal plan will help you control your blood glucose and maintain a healthy lifestyle.  Working with a diet and nutrition specialist (dietitian) can help  you make a meal plan that is best for you.  Keep in mind that carbohydrates (carbs) and alcohol have immediate effects on your blood glucose levels. It is important to count carbs and to use alcohol carefully. This information is not intended to replace advice given to you by your health care provider. Make sure you discuss any questions you have with your health care provider. Document Revised: 09/03/2017 Document Reviewed: 10/26/2016 Elsevier Patient Education  2020 [employer].

## 2020-05-19 ENCOUNTER — Encounter: Payer: Self-pay | Admitting: Internal Medicine

## 2020-05-21 ENCOUNTER — Other Ambulatory Visit: Payer: Self-pay

## 2020-05-21 ENCOUNTER — Other Ambulatory Visit: Payer: Medicare Other

## 2020-05-21 DIAGNOSIS — Z20822 Contact with and (suspected) exposure to covid-19: Secondary | ICD-10-CM | POA: Diagnosis not present

## 2020-05-22 ENCOUNTER — Other Ambulatory Visit: Payer: Self-pay | Admitting: Internal Medicine

## 2020-05-22 DIAGNOSIS — I10 Essential (primary) hypertension: Secondary | ICD-10-CM

## 2020-05-22 DIAGNOSIS — E118 Type 2 diabetes mellitus with unspecified complications: Secondary | ICD-10-CM

## 2020-05-22 LAB — NOVEL CORONAVIRUS, NAA: SARS-CoV-2, NAA: NOT DETECTED

## 2020-05-22 LAB — SARS-COV-2, NAA 2 DAY TAT

## 2020-06-19 ENCOUNTER — Ambulatory Visit: Payer: Medicare Other | Admitting: Family Medicine

## 2020-06-19 ENCOUNTER — Ambulatory Visit: Payer: Self-pay

## 2020-06-19 ENCOUNTER — Encounter: Payer: Self-pay | Admitting: Family Medicine

## 2020-06-19 ENCOUNTER — Other Ambulatory Visit: Payer: Self-pay

## 2020-06-19 VITALS — BP 120/72 | HR 77 | Ht 66.0 in | Wt 154.6 lb

## 2020-06-19 DIAGNOSIS — M79641 Pain in right hand: Secondary | ICD-10-CM

## 2020-06-19 MED ORDER — GABAPENTIN 300 MG PO CAPS
300.0000 mg | ORAL_CAPSULE | Freq: Three times a day (TID) | ORAL | 3 refills | Status: DC | PRN
Start: 2020-06-19 — End: 2022-04-23

## 2020-06-19 MED ORDER — PREDNISONE 50 MG PO TABS
50.0000 mg | ORAL_TABLET | Freq: Every day | ORAL | 0 refills | Status: DC
Start: 2020-06-19 — End: 2020-12-05

## 2020-06-19 NOTE — Progress Notes (Signed)
    Subjective:    CC: R hand pain  I, Lauren Orozco, LAT, ATC, am serving as scribe for Dr. Lynne Leader.  HPI: Pt is a 64 y/o female presenting w/ c/o R hand pain since yesterday.  She locates her pain to her R index finger and hand that radiates into her R forearm.  She has a hx of a C3-7 fusion in 2009.  She notes that she used her right arm a lot more than usual recently.  She moved some furniture around but denies any specific injury.  Radiating pain: yes into R forearm R UE numbness/tingling: yes, tingling Aggravating factors: washing dishes; bathing; gripping Treatments tried: brace; heat; Tylenol  Pertinent review of Systems: No fevers or chills  Relevant historical information: History significant cervical fusion in the past.   Objective:    Vitals:   06/19/20 1412  BP: 120/72  Pulse: 77  SpO2: 95%   General: Well Developed, well nourished, and in no acute distress.   MSK: C-spine normal-appearing decreased cervical motion negative Spurling's test. Upper extremity strength reflexes and sensation are equal normal throughout bilateral upper extremities. Right elbow normal-appearing normal motion normal strength nontender. Right hand slight swelling MCP 2 and 3 otherwise normal-appearing Normal hand motion and strength. Mildly tender palpation dorsal hand. Pulses cap refill and sensation are intact distally.    Impression and Recommendations:    Assessment and Plan: 64 y.o. female with right index finger pain extending to the for a bit.  Most likely tendinopathy or synovitis due to increased activity recently.  Possible C7 radiculopathy is present as well.  Short course of prednisone and gabapentin if not better return to clinic proceed with further imaging and possibly injection.Marland Kitchen  PDMP not reviewed this encounter. No orders of the defined types were placed in this encounter.  Meds ordered this encounter  Medications  . predniSONE (DELTASONE) 50 MG tablet      Sig: Take 1 tablet (50 mg total) by mouth daily.    Dispense:  5 tablet    Refill:  0  . gabapentin (NEURONTIN) 300 MG capsule    Sig: Take 1 capsule (300 mg total) by mouth 3 (three) times daily as needed (nerve pain).    Dispense:  90 capsule    Refill:  3    Discussed warning signs or symptoms. Please see discharge instructions. Patient expresses understanding.   The above documentation has been reviewed and is accurate and complete Lynne Leader, M.D.

## 2020-06-19 NOTE — Patient Instructions (Addendum)
Thank you for coming in today. Take the prednisone.  Ok to use the brace as needed.  If not improved recheck and I will do injections if needed.   Take gabapentin up to 3x daily for nerve pain.   Let me know if not better.

## 2020-06-20 ENCOUNTER — Telehealth: Payer: Self-pay | Admitting: Pharmacist

## 2020-06-20 NOTE — Progress Notes (Signed)
    Chronic Care Management Pharmacy Assistant   Name: Ermelinda Eckert  MRN: [id number] DOB: [birth year]  Reason for Encounter: Medication Review   PCP : Janith Lima, MD  Allergies:   Allergies  Allergen Reactions  . Ciprofloxacin Hives    All floxacin drugs Burn-like hives  . Ofloxacin Hives    Burn like hives  . Other Other (See Comments)    All NUTS and corn due to diverticulitis    Medications: Outpatient Encounter Medications as of 06/20/2020  Medication Sig  . Accu-Chek Softclix Lancets lancets Use to check blood sugar daily. DX: E11.9  . acetaminophen (TYLENOL) 500 MG tablet Take 500 mg by mouth every 6 (six) hours as needed.  . Blood Glucose Monitoring Suppl (ACCU-CHEK GUIDE ME) w/Device KIT 1 Act by Does not apply route in the morning and at bedtime.  . Cholecalciferol 50 MCG (2000 UT) TABS Take 2 tablets (4,000 Units total) by mouth daily.  . Dapagliflozin-metFORMIN HCl ER (XIGDUO XR) 10-500 MG TB24 Take 1 tablet by mouth daily.  Marland Kitchen gabapentin (NEURONTIN) 300 MG capsule Take 1 capsule (300 mg total) by mouth 3 (three) times daily as needed (nerve pain).  Marland Kitchen glucose blood (ACCU-CHEK GUIDE) test strip Use to check blood sugar daily. DX: E11.9  . indapamide (LOZOL) 1.25 MG tablet Take 1 tablet (1.25 mg total) by mouth daily.  . irbesartan (AVAPRO) 150 MG tablet TAKE 1 TABLET(150 MG) BY MOUTH DAILY  . meloxicam (MOBIC) 7.5 MG tablet Take 1 tablet (7.5 mg total) by mouth daily.  . predniSONE (DELTASONE) 50 MG tablet Take 1 tablet (50 mg total) by mouth daily.  . rosuvastatin (CRESTOR) 20 MG tablet TAKE 1 TABLET(20 MG) BY MOUTH DAILY   No facility-administered encounter medications on file as of 06/20/2020.    Current Diagnosis: Patient Active Problem List   Diagnosis Date Noted  . Chronic chest wall pain 04/11/2020  . Chronic pain of left knee 01/30/2020  . Primary osteoarthritis of both knees 01/30/2020  . Nephrolithiasis 04/05/2019  . Vitamin D deficiency  disease 12/20/2018  . Type II diabetes mellitus with manifestations ([place]) 10/13/2016  . Essential hypertension 01/02/2016  . Bilateral carpal tunnel syndrome 01/29/2015  . Low grade lymphoma, stage I ([place]) 12/25/2014  . Visit for screening mammogram 08/24/2014  . Constipation 10/03/2013  . Routine health maintenance 10/20/2011  . Tobacco abuse 10/19/2011  . Hyperlipidemia with target LDL less than 130 12/22/2010  . Generalized OA 12/22/2010  . DISC DISEASE, CERVICAL 12/22/2010    Goals Addressed   None     Follow-Up:  Pharmacist Review    Received call from patient regarding medication management via [facility].  Patient requested an acute fill for Gabapentin $RemoveBefore'300mg'[id number]$ , Prednisone $RemoveBefo'50mg'[id number]$   to be delivered: 06/20/2020 Pharmacy needs refills? No  Confirmed delivery date of 06/20/2020, advised patient that pharmacy will contact them the morning of delivery.  Rosendo Gros, [place] Pharmacist Assistant  [phone]

## 2020-07-04 ENCOUNTER — Encounter: Payer: Self-pay | Admitting: Internal Medicine

## 2020-07-11 ENCOUNTER — Ambulatory Visit (INDEPENDENT_AMBULATORY_CARE_PROVIDER_SITE_OTHER): Payer: Medicare Other | Admitting: Internal Medicine

## 2020-07-11 ENCOUNTER — Encounter: Payer: Self-pay | Admitting: Internal Medicine

## 2020-07-11 ENCOUNTER — Other Ambulatory Visit: Payer: Self-pay

## 2020-07-11 VITALS — BP 136/84 | HR 76 | Temp 98.6°F | Resp 16 | Ht 66.0 in | Wt 153.0 lb

## 2020-07-11 DIAGNOSIS — E118 Type 2 diabetes mellitus with unspecified complications: Secondary | ICD-10-CM

## 2020-07-11 DIAGNOSIS — I10 Essential (primary) hypertension: Secondary | ICD-10-CM

## 2020-07-11 DIAGNOSIS — Z23 Encounter for immunization: Secondary | ICD-10-CM | POA: Diagnosis not present

## 2020-07-11 LAB — HEMOGLOBIN A1C: Hgb A1c MFr Bld: 6.9 % — ABNORMAL HIGH (ref 4.6–6.5)

## 2020-07-11 LAB — BASIC METABOLIC PANEL
BUN: 16 mg/dL (ref 6–23)
CO2: 32 mEq/L (ref 19–32)
Calcium: 9.9 mg/dL (ref 8.4–10.5)
Chloride: 103 mEq/L (ref 96–112)
Creatinine, Ser: 0.82 mg/dL (ref 0.40–1.20)
GFR: 75.61 mL/min (ref >60.00)
Glucose, Bld: 111 mg/dL — ABNORMAL HIGH (ref 70–99)
Potassium: 3.6 mEq/L (ref 3.5–5.1)
Sodium: 141 mEq/L (ref 135–145)

## 2020-07-11 NOTE — Progress Notes (Signed)
Subjective:  Patient ID: Lauren Orozco, female    DOB: 07/24/1956  Age: 64 y.o. MRN: [id number]  CC: Hypertension and Diabetes  This visit occurred during the SARS-CoV-2 public health emergency.  Safety protocols were in place, including screening questions prior to the visit, additional usage of staff PPE, and extensive cleaning of exam room while observing appropriate contact time as indicated for disinfecting solutions.    HPI Lauren Orozco presents for f/up - She tells me her blood pressure and blood sugar have been well controlled.  The only diabetic symptom she has is burning in her feet but she says gabapentin helps with that.  She is active and denies any recent episodes of chest pain, shortness of breath, palpitations, edema, fatigue, headache, dizziness, or lightheadedness.  Outpatient Medications Prior to Visit  Medication Sig Dispense Refill  . Accu-Chek Softclix Lancets lancets Use to check blood sugar daily. DX: E11.9 100 each 3  . acetaminophen (TYLENOL) 500 MG tablet Take 500 mg by mouth every 6 (six) hours as needed.    . Blood Glucose Monitoring Suppl (ACCU-CHEK GUIDE ME) w/Device KIT 1 Act by Does not apply route in the morning and at bedtime. 1 kit 0  . Cholecalciferol 50 MCG (2000 UT) TABS Take 2 tablets (4,000 Units total) by mouth daily. 180 tablet 1  . Dapagliflozin-metFORMIN HCl ER (XIGDUO XR) 10-500 MG TB24 Take 1 tablet by mouth daily. 90 tablet 1  . gabapentin (NEURONTIN) 300 MG capsule Take 1 capsule (300 mg total) by mouth 3 (three) times daily as needed (nerve pain). 90 capsule 3  . glucose blood (ACCU-CHEK GUIDE) test strip Use to check blood sugar daily. DX: E11.9 100 each 3  . indapamide (LOZOL) 1.25 MG tablet Take 1 tablet (1.25 mg total) by mouth daily. 90 tablet 1  . irbesartan (AVAPRO) 150 MG tablet TAKE 1 TABLET(150 MG) BY MOUTH DAILY 90 tablet 0  . meloxicam (MOBIC) 7.5 MG tablet Take 1 tablet (7.5 mg total) by mouth daily. 90 tablet  1  . predniSONE (DELTASONE) 50 MG tablet Take 1 tablet (50 mg total) by mouth daily. 5 tablet 0  . rosuvastatin (CRESTOR) 20 MG tablet TAKE 1 TABLET(20 MG) BY MOUTH DAILY 90 tablet 1   No facility-administered medications prior to visit.    ROS Review of Systems  Constitutional: Negative for chills, diaphoresis, fatigue and fever.  HENT: Negative.   Eyes: Negative.   Respiratory: Negative for cough, chest tightness, shortness of breath and wheezing.   Cardiovascular: Negative for chest pain, palpitations and leg swelling.  Gastrointestinal: Negative for abdominal pain, constipation, diarrhea, nausea and vomiting.  Endocrine: Negative.  Negative for polydipsia, polyphagia and polyuria.  Genitourinary: Negative for difficulty urinating and dysuria.  Musculoskeletal: Negative.  Negative for arthralgias and myalgias.  Skin: Negative.  Negative for color change.  Neurological: Negative.  Negative for dizziness, weakness, numbness and headaches.  Hematological: Negative for adenopathy. Does not bruise/bleed easily.  Psychiatric/Behavioral: Negative.     Objective:  BP 136/84   Pulse 76   Temp 98.6 F (37 C) (Oral)   Resp 16   Ht _0  (1.676 m)   Wt 153 lb (69.4 kg)   SpO2 98%   BMI 24.69 kg/m   BP Readings from Last 3 Encounters:  07/11/20 136/84  06/19/20 120/72  04/23/20 (!) 148/81    Wt Readings from Last 3 Encounters:  07/11/20 153 lb (69.4 kg)  06/19/20 154 lb 9.6 oz (70.1 kg)  04/23/20 153  lb 12.8 oz (69.8 kg)    Physical Exam Vitals reviewed.  Constitutional:      Appearance: Normal appearance.  HENT:     Nose: Nose normal.     Mouth/Throat:     Mouth: Mucous membranes are moist.  Eyes:     General: No scleral icterus.    Conjunctiva/sclera: Conjunctivae normal.  Cardiovascular:     Rate and Rhythm: Normal rate and regular rhythm.     Heart sounds: No murmur heard.   Pulmonary:     Effort: Pulmonary effort is normal.     Breath sounds: No stridor. No  wheezing, rhonchi or rales.  Abdominal:     General: Abdomen is flat. Bowel sounds are normal. There is no distension.     Palpations: Abdomen is soft. There is no hepatomegaly, splenomegaly or mass.  Musculoskeletal:        General: Normal range of motion.     Cervical back: Neck supple.     Right lower leg: No edema.     Left lower leg: No edema.  Lymphadenopathy:     Cervical: No cervical adenopathy.  Skin:    General: Skin is warm and dry.     Coloration: Skin is not pale.  Neurological:     General: No focal deficit present.     Mental Status: She is alert.  Psychiatric:        Mood and Affect: Mood normal.        Behavior: Behavior normal.     Lab Results  Component Value Date   WBC 5.9 04/22/2020   HGB 13.6 04/22/2020   HCT 40.8 04/22/2020   PLT 344 04/22/2020   GLUCOSE 111 (H) 07/11/2020   CHOL 128 01/30/2020   TRIG 47.0 01/30/2020   HDL 47.20 01/30/2020   LDLDIRECT 150.8 10/19/2011   LDLCALC 72 01/30/2020   ALT 19 04/22/2020   AST 19 04/22/2020   NA 141 07/11/2020   K 3.6 07/11/2020   CL 103 07/11/2020   CREATININE 0.82 07/11/2020   BUN 16 07/11/2020   CO2 32 07/11/2020   TSH 0.84 08/01/2019   HGBA1C 6.9 (H) 07/11/2020   MICROALBUR 1.4 01/30/2020    CT CHEST LUNG CA SCREEN LOW DOSE W/O CM  Result Date: 04/23/2020 CLINICAL DATA:  64 year old female with 45 pack year history of smoking. Lung cancer screening. EXAM: CT CHEST WITHOUT CONTRAST LOW-DOSE FOR LUNG CANCER SCREENING TECHNIQUE: Multidetector CT imaging of the chest was performed following the standard protocol without IV contrast. COMPARISON:  04/21/2019 FINDINGS: Cardiovascular: The heart size is normal. No substantial pericardial effusion. Coronary artery calcification is evident. Atherosclerotic calcification is noted in the wall of the thoracic aorta. Mediastinum/Nodes: No mediastinal lymphadenopathy. No evidence for gross hilar lymphadenopathy although assessment is limited by the lack of  intravenous contrast on today's study. The esophagus has normal imaging features. There is no axillary lymphadenopathy. Lungs/Pleura: Centrilobular emphsyema noted. Stable 3.8 mm perifissural right upper lobe pulmonary nodule. No new suspicious pulmonary nodule or mass. No focal airspace consolidation. No pleural effusion. Upper Abdomen: Stable 2.8 cm right adrenal adenoma Musculoskeletal: No worrisome lytic or sclerotic osseous abnormality. IMPRESSION: 1. Lung-RADS 2, benign appearance or behavior. Continue annual screening with low-dose chest CT without contrast in 12 months. 2. Stable right adrenal adenoma. 3. Aortic Atherosclerosis (ICD10-I70.0) and Emphysema (ICD10-J43.9). Electronically Signed   By: Misty Stanley M.D.   On: 04/23/2020 08:20    Assessment & Plan:   Pragya was seen today for hypertension and  diabetes.  Diagnoses and all orders for this visit:  Essential hypertension- Her blood pressure is well controlled.  Electrolytes and renal function are normal.  Will continue the combination of an ARB and thiazide diuretic. -     Cancel: BASIC METABOLIC PANEL WITH GFR; Future -     Basic metabolic panel; Future -     Basic metabolic panel  Type II diabetes mellitus with manifestations ([place])- Her blood sugar is adequately well controlled.  Will continue the combination of Metformin and an SGLT2 inhibitor. -     Cancel: BASIC METABOLIC PANEL WITH GFR; Future -     Hemoglobin A1c; Future -     Basic metabolic panel; Future -     Basic metabolic panel -     Hemoglobin A1c  Other orders -     Flu Vaccine QUAD 6+ mos PF IM (Fluarix Quad PF) -     Pneumococcal polysaccharide vaccine 23-valent greater than or equal to 2yo subcutaneous/IM   I am having Ivin Booty D. Zettler maintain her Cholecalciferol, acetaminophen, meloxicam, Xigduo XR, rosuvastatin, Accu-Chek Guide Me, Accu-Chek Softclix Lancets, indapamide, Accu-Chek Guide, irbesartan, predniSONE, and gabapentin.  No orders of the  defined types were placed in this encounter.    Follow-up: Return in about 6 months (around 01/09/2021).  Scarlette Calico, MD

## 2020-07-11 NOTE — Patient Instructions (Signed)
Type 2 Diabetes Mellitus, Diagnosis, Adult Type 2 diabetes (type 2 diabetes mellitus) is a long-term (chronic) disease. In type 2 diabetes, one or both of these problems may be present:  The pancreas does not make enough of a hormone called insulin.  Cells in the body do not respond properly to insulin that the body makes (insulin resistance). Normally, insulin allows blood sugar (glucose) to enter cells in the body. The cells use glucose for energy. Insulin resistance or lack of insulin causes excess glucose to build up in the blood instead of going into cells. As a result, high blood glucose (hyperglycemia) develops. What increases the risk? The following factors may make you more likely to develop type 2 diabetes:  Having a family member with type 2 diabetes.  Being overweight or obese.  Having an inactive (sedentary) lifestyle.  Having been diagnosed with insulin resistance.  Having a history of prediabetes, gestational diabetes, or polycystic ovary syndrome (PCOS).  Being of American-Indian, African-American, Hispanic/Latino, or Asian/Pacific Islander descent. What are the signs or symptoms? In the early stage of this condition, you may not have symptoms. Symptoms develop slowly and may include:  Increased thirst (polydipsia).  Increased hunger(polyphagia).  Increased urination (polyuria).  Increased urination during the night (nocturia).  Unexplained weight loss.  Frequent infections that keep coming back (recurring).  Fatigue.  Weakness.  Vision changes, such as blurry vision.  Cuts or bruises that are slow to heal.  Tingling or numbness in the hands or feet.  Dark patches on the skin (acanthosis nigricans). How is this diagnosed? This condition is diagnosed based on your symptoms, your medical history, a physical exam, and your blood glucose level. Your blood glucose may be checked with one or more of the following blood tests:  A fasting blood glucose (FBG)  test. You will not be allowed to eat (you will fast) for 8 hours or longer before a blood sample is taken.  A random blood glucose test. This test checks blood glucose at any time of day regardless of when you ate.  An A1c (hemoglobin A1c) blood test. This test provides information about blood glucose control over the previous 2-3 months.  An oral glucose tolerance test (OGTT). This test measures your blood glucose at two times: ? After fasting. This is your baseline blood glucose level. ? Two hours after drinking a beverage that contains glucose. You may be diagnosed with type 2 diabetes if:  Your FBG level is 126 mg/dL (7.0 mmol/L) or higher.  Your random blood glucose level is 200 mg/dL (11.1 mmol/L) or higher.  Your A1c level is 6.5% or higher.  Your OGTT result is higher than 200 mg/dL (11.1 mmol/L). These blood tests may be repeated to confirm your diagnosis. How is this treated? Your treatment may be managed by a specialist called an endocrinologist. Type 2 diabetes may be treated by following instructions from your health care provider about:  Making diet and lifestyle changes. This may include: ? Following an individualized nutrition plan that is developed by a diet and nutrition specialist (registered dietitian). ? Exercising regularly. ? Finding ways to manage stress.  Checking your blood glucose level as often as told.  Taking diabetes medicines or insulin daily. This helps to keep your blood glucose levels in the healthy range. ? If you use insulin, you may need to adjust the dosage depending on how physically active you are and what foods you eat. Your health care provider will tell you how to adjust your dosage.    Taking medicines to help prevent complications from diabetes, such as: ? Aspirin. ? Medicine to lower cholesterol. ? Medicine to control blood pressure. Your health care provider will set individualized treatment goals for you. Your goals will be based on  your age, other medical conditions you have, and how you respond to diabetes treatment. Generally, the goal of treatment is to maintain the following blood glucose levels:  Before meals (preprandial): 80-130 mg/dL (4.4-7.2 mmol/L).  After meals (postprandial): below 180 mg/dL (10 mmol/L).  A1c level: less than 7%. Follow these instructions at home: Questions to ask your health care provider  Consider asking the following questions: ? Do I need to meet with a diabetes educator? ? Where can I find a support group for people with diabetes? ? What equipment will I need to manage my diabetes at home? ? What diabetes medicines do I need, and when should I take them? ? How often do I need to check my blood glucose? ? What number can I call if I have questions? ? When is my next appointment? General instructions  Take over-the-counter and prescription medicines only as told by your health care provider.  Keep all follow-up visits as told by your health care provider. This is important.  For more information about diabetes, visit: ? American Diabetes Association (ADA): www.diabetes.org ? American Association of Diabetes Educators (AADE): www.diabeteseducator.org Contact a health care provider if:  Your blood glucose is at or above 240 mg/dL (13.3 mmol/L) for 2 days in a row.  You have been sick or have had a fever for 2 days or longer, and you are not getting better.  You have any of the following problems for more than 6 hours: ? You cannot eat or drink. ? You have nausea and vomiting. ? You have diarrhea. Get help right away if:  Your blood glucose is lower than 54 mg/dL (3.0 mmol/L).  You become confused or you have trouble thinking clearly.  You have difficulty breathing.  You have moderate or large ketone levels in your urine. Summary  Type 2 diabetes (type 2 diabetes mellitus) is a long-term (chronic) disease. In type 2 diabetes, the pancreas does not make enough of a  hormone called insulin, or cells in the body do not respond properly to insulin that the body makes (insulin resistance).  This condition is treated by making diet and lifestyle changes and taking diabetes medicines or insulin.  Your health care provider will set individualized treatment goals for you. Your goals will be based on your age, other medical conditions you have, and how you respond to diabetes treatment.  Keep all follow-up visits as told by your health care provider. This is important. This information is not intended to replace advice given to you by your health care provider. Make sure you discuss any questions you have with your health care provider. Document Revised: 11/19/2017 Document Reviewed: 10/25/2015 Elsevier Patient Education  2020 Elsevier Inc.  

## 2020-07-18 ENCOUNTER — Other Ambulatory Visit: Payer: Self-pay | Admitting: Internal Medicine

## 2020-07-18 DIAGNOSIS — E785 Hyperlipidemia, unspecified: Secondary | ICD-10-CM

## 2020-07-30 ENCOUNTER — Telehealth: Payer: Medicare Other

## 2020-07-30 NOTE — Chronic Care Management (AMB) (Deleted)
Chronic Care Management Pharmacy  Name: Lauren Orozco  MRN: [id number] DOB: 08/14/1956  Chief Complaint/ HPI  Lauren Orozco,  64 y.o. , female presents for their Follow-Up CCM visit with the clinical pharmacist via telephone due to COVID-19 Pandemic.  PCP : Janith Lima, MD  Their chronic conditions include: Hypertension, Hyperlipidemia, Diabetes, Osteoarthritis and Tobacco use  Pt is from [place], has lived here her whole life, 25 of 67 kids, took care of 2 sick parents. Neck fused 2009, had to stop working, caused a lot of pain.  Multiple family members were on dialysis and pt is very concerned about kidney issues due to this.    Office Visits: 07/11/20 Dr Ronnald Ramp OV: chronic f/u. BP and BG controlled, no med changes. Given flu and PPSV-23 vaccines.  04/11/20 Dr Ronnald Ramp OV: back/chest wall pain - continue meloxicam.   02/08/20 Dr Ronnald Ramp OV: added irbesartan 150 mg (BP uncontrolled and DM worsening). Referred to nutritionist for DM.  01/30/20 Dr Ronnald Ramp OV: started Xigduo 10-500 mg for A1c up to 6.9 and BP benefits. Also started meloxicam for knee pain.  08/01/19 Dr Ronnald Ramp OV: c/o palpitations. EKG normal. Ordered 48-hr Holter monitor. Pt was not taking statin, asked to restart rosuvastatin.   Consult Visit: 06/19/20 Dr Georgina Snell (sports med): R hand pain, likely tendinopathy or synovitis d/t increased activity. Rx'd prednisone 50 mg course and gabapentin 300 mg TID  04/23/20 Dr Alvy Bimler (heme/onc): discussed smoking cessation < 1ppd. No signs of cancer. No med changes.  03/14/20, 03/21/20 Alvester Morin RD: DM self-mgmt course, given many handouts.  Allergies  Allergen Reactions  . Ciprofloxacin Hives    All floxacin drugs Burn-like hives  . Ofloxacin Hives    Burn like hives  . Other Other (See Comments)    All NUTS and corn due to diverticulitis   Medications: Outpatient Encounter Medications as of 07/30/2020  Medication Sig  . Accu-Chek Softclix Lancets lancets  Use to check blood sugar daily. DX: E11.9  . acetaminophen (TYLENOL) 500 MG tablet Take 500 mg by mouth every 6 (six) hours as needed.  . Blood Glucose Monitoring Suppl (ACCU-CHEK GUIDE ME) w/Device KIT 1 Act by Does not apply route in the morning and at bedtime.  . Cholecalciferol 50 MCG (2000 UT) TABS Take 2 tablets (4,000 Units total) by mouth daily.  . Dapagliflozin-metFORMIN HCl ER (XIGDUO XR) 10-500 MG TB24 Take 1 tablet by mouth daily.  Marland Kitchen gabapentin (NEURONTIN) 300 MG capsule Take 1 capsule (300 mg total) by mouth 3 (three) times daily as needed (nerve pain).  Marland Kitchen glucose blood (ACCU-CHEK GUIDE) test strip Use to check blood sugar daily. DX: E11.9  . indapamide (LOZOL) 1.25 MG tablet Take 1 tablet (1.25 mg total) by mouth daily.  . irbesartan (AVAPRO) 150 MG tablet TAKE 1 TABLET(150 MG) BY MOUTH DAILY  . meloxicam (MOBIC) 7.5 MG tablet Take 1 tablet (7.5 mg total) by mouth daily.  . predniSONE (DELTASONE) 50 MG tablet Take 1 tablet (50 mg total) by mouth daily.  . rosuvastatin (CRESTOR) 20 MG tablet TAKE ONE TABLET BY MOUTH ONCE DAILY AT BEDTIME   No facility-administered encounter medications on file as of 07/30/2020.   Wt Readings from Last 3 Encounters:  07/11/20 153 lb (69.4 kg)  06/19/20 154 lb 9.6 oz (70.1 kg)  04/23/20 153 lb 12.8 oz (69.8 kg)    Current Diagnosis/Assessment:     Goals Addressed   None     Hypertension   BP goal < 130/80  Office blood pressures are  BP Readings from Last 3 Encounters:  07/11/20 136/84  06/19/20 120/72  04/23/20 (!) 148/81   Kidney Function Lab Results  Component Value Date/Time   CREATININE 0.82 07/11/2020 10:06 AM   CREATININE 0.95 04/22/2020 01:08 PM   CREATININE 0.8 01/02/2016 10:34 AM   CREATININE 0.8 12/18/2014 11:12 AM   GFR 75.61 07/11/2020 10:06 AM   GFRNONAA >60 04/22/2020 01:08 PM   GFRAA >60 04/22/2020 01:08 PM   K 3.6 07/11/2020 10:06 AM   K 4.1 04/22/2020 01:08 PM   K 4.0 01/02/2016 10:34 AM   K 4.1  12/18/2014 11:12 AM   Patient checks BP at home daily Patient home BP readings are ranging: 144/100 >> 131/84 recheck; typically 120s/80s  Patient has failed these meds in the past: chlorthalidone, azilsartan, telmisartan Patient is currently controlled on the following medications:   Indapamide 1.25 mg daily AM  Irbesartan 150 mg daily   We discussed: Pt started taking irbesartan 2 weeks ago, BP has been mainly controlled < 130/80; pt denies side effects or issues; pt reports she is making an effort to improve diet, limit salt, and exercise daily  Plan  Continue current medications and control with diet and exercise   Hyperlipidemia   LDL goal < 100  Lipid Panel     Component Value Date/Time   CHOL 128 01/30/2020 1634   TRIG 47.0 01/30/2020 1634   HDL 47.20 01/30/2020 1634   CHOLHDL 3 01/30/2020 1634   VLDL 9.4 01/30/2020 1634   LDLCALC 72 01/30/2020 1634   LDLDIRECT 150.8 10/19/2011 1336   Hepatic Function Latest Ref Rng & Units 04/22/2020 12/20/2018 12/31/2017  Total Protein 6.5 - 8.1 g/dL 7.8 8.0 7.3  Albumin 3.5 - 5.0 g/dL 4.4 4.8 3.9  AST 15 - 41 U/L 19 16 14   ALT 0 - 44 U/L 19 25 15   Alk Phosphatase 38 - 126 U/L 65 78 79  Total Bilirubin 0.3 - 1.2 mg/dL 0.7 0.5 0.4   The ASCVD Risk score Mikey Bussing DC Jr., et al., 2013) failed to calculate for the following reasons:   The valid total cholesterol range is 130 to 320 mg/dL   Patient has failed these meds in past: atorvastatin, lovastatin (no statin allergy listed) Patient is currently controlled on the following medications:   rosuvastatin 20 mg HS  We discussed:  diet and exercise extensively  Plan  Continue current medications and control with diet and exercise   Diabetes   A1c goal <7%  Recent Relevant Labs: Lab Results  Component Value Date/Time   HGBA1C 6.9 (H) 07/11/2020 10:05 AM   HGBA1C 6.9 (H) 01/30/2020 04:34 PM   GFR 75.61 07/11/2020 10:06 AM   GFR 78.39 01/30/2020 04:34 PM   MICROALBUR 1.4  01/30/2020 04:34 PM   MICROALBUR 0.9 12/20/2018 11:38 AM    Last diabetic Eye exam:  Lab Results  Component Value Date/Time   HMDIABEYEEXA No Retinopathy 02/14/2020 12:00 AM    Last diabetic Foot exam: No results found for: HMDIABFOOTEX   Checking BG: Daily Recent FBG Readings: 110-115  Patient has failed these meds in past: none Patient is currently controlled on the following medications:  Xigduo (dapagliflozin-metformin) 10-500 mg once daily  We discussed: Pt started taking Xigduo 2 weeks ago; fasting BG has improved from 140s to 110-115; pt denies side effects or issues; pt is working to increase water intake and limit sweets/carbs; she is also exercising daily.  Plan  Continue current medications and control with  diet and exercise   Nerve pain   Patient has failed these meds in past: *** Patient is currently {CHL Controlled/Uncontrolled:[id number]} on the following medications:  . Gabapentin 300 mg TID prn  (Dr Georgina Snell)  We discussed:  ***  Plan  Continue {CHL [facility] SVEXO:[id number]}  Medication Management   Pt uses [facility] for all medications Uses pill box? {Yes or If no, why not?:20788} Pt endorses ***% compliance  We discussed: Current pharmacy is preferred with insurance plan and patient is satisfied with pharmacy services  Plan  Continue current medication management strategy    Follow up: *** month phone visit  ***

## 2020-08-22 DIAGNOSIS — H40013 Open angle with borderline findings, low risk, bilateral: Secondary | ICD-10-CM | POA: Diagnosis not present

## 2020-09-17 ENCOUNTER — Telehealth: Payer: Self-pay | Admitting: Pharmacist

## 2020-09-17 NOTE — Progress Notes (Signed)
Chronic Care Management Pharmacy Assistant   Name: Lauren Orozco  MRN: [id number] DOB: 10-15-1955  Reason for Encounter: Diabetic Adherence Call  Patient Questions:  1.  Have you seen any other providers since your last visit? Yes, patient last seen Dr. Sherene Sires on 06/19/20  2.  Any changes in your medicines or health? Yes, Dr. Amalia Hailey added Gabapentin and Prednisone on 06/19/20    PCP : Janith Lima, MD  Allergies:   Allergies  Allergen Reactions  . Ciprofloxacin Hives    All floxacin drugs Burn-like hives  . Ofloxacin Hives    Burn like hives  . Other Other (See Comments)    All NUTS and corn due to diverticulitis    Medications: Outpatient Encounter Medications as of 09/17/2020  Medication Sig  . Accu-Chek Softclix Lancets lancets Use to check blood sugar daily. DX: E11.9  . acetaminophen (TYLENOL) 500 MG tablet Take 500 mg by mouth every 6 (six) hours as needed.  . Blood Glucose Monitoring Suppl (ACCU-CHEK GUIDE ME) w/Device KIT 1 Act by Does not apply route in the morning and at bedtime.  . Cholecalciferol 50 MCG (2000 UT) TABS Take 2 tablets (4,000 Units total) by mouth daily.  . Dapagliflozin-metFORMIN HCl ER (XIGDUO XR) 10-500 MG TB24 Take 1 tablet by mouth daily.  Marland Kitchen gabapentin (NEURONTIN) 300 MG capsule Take 1 capsule (300 mg total) by mouth 3 (three) times daily as needed (nerve pain).  Marland Kitchen glucose blood (ACCU-CHEK GUIDE) test strip Use to check blood sugar daily. DX: E11.9  . indapamide (LOZOL) 1.25 MG tablet Take 1 tablet (1.25 mg total) by mouth daily.  . irbesartan (AVAPRO) 150 MG tablet TAKE 1 TABLET(150 MG) BY MOUTH DAILY  . meloxicam (MOBIC) 7.5 MG tablet Take 1 tablet (7.5 mg total) by mouth daily.  . predniSONE (DELTASONE) 50 MG tablet Take 1 tablet (50 mg total) by mouth daily.  . rosuvastatin (CRESTOR) 20 MG tablet TAKE ONE TABLET BY MOUTH ONCE DAILY AT BEDTIME   No facility-administered encounter medications on file as of 09/17/2020.     Current Diagnosis: Patient Active Problem List   Diagnosis Date Noted  . Chronic chest wall pain 04/11/2020  . Chronic pain of left knee 01/30/2020  . Primary osteoarthritis of both knees 01/30/2020  . Nephrolithiasis 04/05/2019  . Vitamin D deficiency disease 12/20/2018  . Type II diabetes mellitus with manifestations ([place]) 10/13/2016  . Essential hypertension 01/02/2016  . Bilateral carpal tunnel syndrome 01/29/2015  . Low grade lymphoma, stage I ([place]) 12/25/2014  . Visit for screening mammogram 08/24/2014  . Constipation 10/03/2013  . Routine health maintenance 10/20/2011  . Tobacco abuse 10/19/2011  . Hyperlipidemia with target LDL less than 130 12/22/2010  . Generalized OA 12/22/2010  . DISC DISEASE, CERVICAL 12/22/2010    Goals Addressed   None     Follow-Up:  Pharmacist Review   Recent Relevant Labs: Lab Results  Component Value Date/Time   HGBA1C 6.9 (H) 07/11/2020 10:05 AM   HGBA1C 6.9 (H) 01/30/2020 04:34 PM   MICROALBUR 1.4 01/30/2020 04:34 PM   MICROALBUR 0.9 12/20/2018 11:38 AM    Kidney Function Lab Results  Component Value Date/Time   CREATININE 0.82 07/11/2020 10:06 AM   CREATININE 0.95 04/22/2020 01:08 PM   CREATININE 0.8 01/02/2016 10:34 AM   CREATININE 0.8 12/18/2014 11:12 AM   GFR 75.61 07/11/2020 10:06 AM   GFRNONAA >60 04/22/2020 01:08 PM   GFRAA >60 04/22/2020 01:08 PM    . Current antihyperglycemic  regimen: The patient regime is Dapagliflozin-metformin  . What recent interventions/DTPs have been made to improve glycemic control: The patient states she is not taking the Dapagliflozin-metformin because of the way it makes her feel  . Have there been any recent hospitalizations or ED visits since last visit with CPP? The patient states that she has not been to the hospital or ED  . Patient denies hypoglycemic symptoms  . Patient denies hyperglycemic symptoms  . How often are you checking your blood sugar? The patient states that she  does check her Blood sugar regularly  . What are your blood sugars ranging?  o Fasting: NA o Before meals: 112 o After meals: 107 o Bedtime: NA . During the week, how often does your blood glucose drop below 70? The patient states that she has not had any readings below 70  . Are you checking your feet daily/regularly? The patient states that she checks her feet regularly and that the only thing that she is concerned about is that her toes continue to crisscross on top of one another but she eats mustard and it goes away  Adherence Review: Is the patient currently on a STATIN medication? Rosurvastatin Is the patient currently on ACE/ARB medication? Irbesartan Does the patient have >5 day gap between last estimated fill dates? No   Wendy Poet, Clinical Pharmacist Assistant [facility]

## 2020-09-24 ENCOUNTER — Ambulatory Visit: Payer: Medicare Other | Attending: Internal Medicine

## 2020-09-24 DIAGNOSIS — Z23 Encounter for immunization: Secondary | ICD-10-CM

## 2020-09-24 NOTE — Progress Notes (Signed)
   Covid-19 Vaccination Clinic  Name:  Lauren Orozco    MRN: [id number] DOB: 24-Mar-1956  09/24/2020  [name] was observed post Covid-19 immunization for 15 minutes without incident. She was provided with Vaccine Information Sheet and instruction to access the V-Safe system.   [name] was instructed to call 911 with any severe reactions post vaccine: Marland Kitchen Difficulty breathing  . Swelling of face and throat  . A fast heartbeat  . A bad rash all over body  . Dizziness and weakness   Immunizations Administered    Name Date Dose VIS Date Route   Pfizer COVID-19 Vaccine 09/24/2020  4:44 PM 0.3 mL 07/24/2020 Intramuscular   Manufacturer: [employer]   Lot: [id number]   Jane [place]: [id number]

## 2020-10-07 ENCOUNTER — Telehealth: Payer: Medicare Other

## 2020-10-07 NOTE — Chronic Care Management (AMB) (Deleted)
Chronic Care Management Pharmacy  Name: Lauren Orozco  MRN: [id number] DOB: June 10, 1956  Chief Complaint/ HPI  Lauren Orozco [place],  65 y.o. , female presents for their Follow-Up CCM visit with the clinical pharmacist via telephone due to COVID-19 Pandemic.  PCP : Janith Lima, MD  Their chronic conditions include: Hypertension, Hyperlipidemia, Diabetes, Osteoarthritis and Tobacco use  Lived here whole life, 93 of 13 kids, took care of 2 sick parents. Neck fused 2009, had to stop working, caused a lot of pain.  Multiple family members were on dialysis and pt is very concerned about kidney issues due to this.    Office Visits: 07/11/20 Dr Ronnald Ramp OV: BP and DM controlled, no med changes.  04/11/20 Dr Ronnald Ramp OV: back/chest wall pain - continue meloxicam.   02/08/20 Dr Ronnald Ramp OV: added irbesartan 150 mg (BP uncontrolled and DM worsening). Referred to nutritionist for DM.  01/30/20 Dr Ronnald Ramp OV: started Xigduo 10-500 mg for A1c up to 6.9 and BP benefits. Also started meloxicam for knee pain.  08/01/19 Dr Ronnald Ramp OV: c/o palpitations. EKG normal. Ordered 48-hr Holter monitor. Pt was not taking statin, asked to restart rosuvastatin.   Consult Visit: 04/23/20 Dr Alvy Bimler (heme/onc): discussed smoking cessation < 1ppd. No signs of cancer. No med changes.  03/14/20, 03/21/20 Alvester Morin RD: DM self-mgmt course, given many handouts.  09/12/19 ED visit: URI and arm pain. COVID negative. Rx'd flonase, benzonatate. Rec'd Tylenol/ibuprofen for arm pain.   Allergies  Allergen Reactions  . Ciprofloxacin Hives    All floxacin drugs Burn-like hives  . Ofloxacin Hives    Burn like hives  . Other Other (See Comments)    All NUTS and corn due to diverticulitis    Medications: Outpatient Encounter Medications as of 10/07/2020  Medication Sig  . Accu-Chek Softclix Lancets lancets Use to check blood sugar daily. DX: E11.9  . acetaminophen (TYLENOL) 500 MG tablet Take 500 mg by mouth every 6  (six) hours as needed.  . Blood Glucose Monitoring Suppl (ACCU-CHEK GUIDE ME) w/Device KIT 1 Act by Does not apply route in the morning and at bedtime.  . Cholecalciferol 50 MCG (2000 UT) TABS Take 2 tablets (4,000 Units total) by mouth daily.  . Dapagliflozin-metFORMIN HCl ER (XIGDUO XR) 10-500 MG TB24 Take 1 tablet by mouth daily.  Marland Kitchen gabapentin (NEURONTIN) 300 MG capsule Take 1 capsule (300 mg total) by mouth 3 (three) times daily as needed (nerve pain).  Marland Kitchen glucose blood (ACCU-CHEK GUIDE) test strip Use to check blood sugar daily. DX: E11.9  . indapamide (LOZOL) 1.25 MG tablet Take 1 tablet (1.25 mg total) by mouth daily.  . irbesartan (AVAPRO) 150 MG tablet TAKE 1 TABLET(150 MG) BY MOUTH DAILY  . meloxicam (MOBIC) 7.5 MG tablet Take 1 tablet (7.5 mg total) by mouth daily.  . predniSONE (DELTASONE) 50 MG tablet Take 1 tablet (50 mg total) by mouth daily.  . rosuvastatin (CRESTOR) 20 MG tablet TAKE ONE TABLET BY MOUTH ONCE DAILY AT BEDTIME   No facility-administered encounter medications on file as of 10/07/2020.   Wt Readings from Last 3 Encounters:  07/11/20 153 lb (69.4 kg)  06/19/20 154 lb 9.6 oz (70.1 kg)  04/23/20 153 lb 12.8 oz (69.8 kg)   Lab Results  Component Value Date   CREATININE 0.82 07/11/2020   BUN 16 07/11/2020   GFR 75.61 07/11/2020   GFRNONAA >60 04/22/2020   GFRAA >60 04/22/2020   NA 141 07/11/2020   K 3.6 07/11/2020  CALCIUM 9.9 07/11/2020   CO2 32 07/11/2020    Current Diagnosis/Assessment:     Goals Addressed   None     Hypertension   BP goal < 130/80  Office blood pressures are  BP Readings from Last 3 Encounters:  07/11/20 136/84  06/19/20 120/72  04/23/20 (!) 148/81   Patient checks BP at home daily Patient home BP readings are ranging: 144/100 >> 131/84 recheck; typically 120s/80s  Patient has failed these meds in the past: chlorthalidone, azilsartan, telmisartan Patient is currently controlled on the following medications:    Indapamide 1.25 mg daily AM  Irbesartan 150 mg daily   We discussed: Pt started taking irbesartan 2 weeks ago, BP has been mainly controlled < 130/80; pt denies side effects or issues; pt reports she is making an effort to improve diet, limit salt, and exercise daily  Plan  Continue current medications and control with diet and exercise    Hyperlipidemia   LDL goal < 100  Lipid Panel     Component Value Date/Time   CHOL 128 01/30/2020 1634   TRIG 47.0 01/30/2020 1634   HDL 47.20 01/30/2020 1634   CHOLHDL 3 01/30/2020 1634   VLDL 9.4 01/30/2020 1634   LDLCALC 72 01/30/2020 1634   LDLDIRECT 150.8 10/19/2011 1336   The ASCVD Risk score (Goff DC Jr., et al., 2013) failed to calculate for the following reasons:   The valid total cholesterol range is 130 to 320 mg/dL   Patient has failed these meds in past: atorvastatin, lovastatin (no statin allergy listed) Patient is currently controlled on the following medications:   rosuvastatin 20 mg HS  We discussed:  diet and exercise extensively  Plan  Continue current medications and control with diet and exercise   Diabetes   A1c goal <7%  Recent Relevant Labs: Lab Results  Component Value Date/Time   HGBA1C 6.9 (H) 07/11/2020 10:05 AM   HGBA1C 6.9 (H) 01/30/2020 04:34 PM   GFR 75.61 07/11/2020 10:06 AM   GFR 78.39 01/30/2020 04:34 PM   MICROALBUR 1.4 01/30/2020 04:34 PM   MICROALBUR 0.9 12/20/2018 11:38 AM    Last diabetic Eye exam:  Lab Results  Component Value Date/Time   HMDIABEYEEXA No Retinopathy 02/14/2020 12:00 AM    Last diabetic Foot exam: No results found for: HMDIABFOOTEX   Checking BG: Daily Recent FBG Readings: 110-115  Patient has failed these meds in past: none Patient is currently controlled on the following medications:  Xigduo (dapagliflozin-metformin) 10-500 mg once daily (not taking)  We discussed: Pt started taking Xigduo 2 weeks ago; fasting BG has improved from 140s to 110-115; pt  denies side effects or issues; pt is working to increase water intake and limit sweets/carbs; she is also exercising daily.  Plan  Continue current medications and control with diet and exercise    Medication Management   Pt uses [facility] for all medications 90-day vials Pt endorses 100% compliance  We discussed: ***  Plan  Utilize [facility] for medication synchronization, packaging and delivery   Follow up: 6 month phone visit  Charlene Brooke, PharmD, BCACP Clinical Pharmacist [facility] [phone]

## 2020-11-04 ENCOUNTER — Telehealth: Payer: Self-pay | Admitting: Pharmacist

## 2020-11-04 NOTE — Progress Notes (Signed)
° ° °  Chronic Care Management Pharmacy Assistant   Name: Kaislyn Gulas  MRN: [id number] DOB: [birth year]  Reason for Encounter: Chart Review   PCP : Janith Lima, MD  Allergies:   Allergies  Allergen Reactions   Ciprofloxacin Hives    All floxacin drugs Burn-like hives   Ofloxacin Hives    Burn like hives   Other Other (See Comments)    All NUTS and corn due to diverticulitis    Medications: Outpatient Encounter Medications as of 11/04/2020  Medication Sig   Accu-Chek Softclix Lancets lancets Use to check blood sugar daily. DX: E11.9   acetaminophen (TYLENOL) 500 MG tablet Take 500 mg by mouth every 6 (six) hours as needed.   Blood Glucose Monitoring Suppl (ACCU-CHEK GUIDE ME) w/Device KIT 1 Act by Does not apply route in the morning and at bedtime.   Cholecalciferol 50 MCG (2000 UT) TABS Take 2 tablets (4,000 Units total) by mouth daily.   Dapagliflozin-metFORMIN HCl ER (XIGDUO XR) 10-500 MG TB24 Take 1 tablet by mouth daily.   gabapentin (NEURONTIN) 300 MG capsule Take 1 capsule (300 mg total) by mouth 3 (three) times daily as needed (nerve pain).   glucose blood (ACCU-CHEK GUIDE) test strip Use to check blood sugar daily. DX: E11.9   indapamide (LOZOL) 1.25 MG tablet Take 1 tablet (1.25 mg total) by mouth daily.   irbesartan (AVAPRO) 150 MG tablet TAKE 1 TABLET(150 MG) BY MOUTH DAILY   meloxicam (MOBIC) 7.5 MG tablet Take 1 tablet (7.5 mg total) by mouth daily.   predniSONE (DELTASONE) 50 MG tablet Take 1 tablet (50 mg total) by mouth daily.   rosuvastatin (CRESTOR) 20 MG tablet TAKE ONE TABLET BY MOUTH ONCE DAILY AT BEDTIME   No facility-administered encounter medications on file as of 11/04/2020.    Current Diagnosis: Patient Active Problem List   Diagnosis Date Noted   Chronic chest wall pain 04/11/2020   Chronic pain of left knee 01/30/2020   Primary osteoarthritis of both knees 01/30/2020   Nephrolithiasis 04/05/2019   Vitamin D  deficiency disease 12/20/2018   Type II diabetes mellitus with manifestations ([place]) 10/13/2016   Essential hypertension 01/02/2016   Bilateral carpal tunnel syndrome 01/29/2015   Low grade lymphoma, stage I ([place]) 12/25/2014   Visit for screening mammogram 08/24/2014   Constipation 10/03/2013   Routine health maintenance 10/20/2011   Tobacco abuse 10/19/2011   Hyperlipidemia with target LDL less than 130 12/22/2010   Generalized OA 12/22/2010   [place] DISEASE, CERVICAL 12/22/2010    Goals Addressed   None     Follow-Up:  Pharmacist Review   Reviewed chart for medication changes and adherence.    No gaps in adherence identified. Patient has follow up scheduled with pharmacy team. No further action required.   Wendy Poet, [place] [phone]

## 2020-11-07 ENCOUNTER — Telehealth: Payer: Medicare Other

## 2020-12-05 ENCOUNTER — Ambulatory Visit (INDEPENDENT_AMBULATORY_CARE_PROVIDER_SITE_OTHER): Payer: Medicare Other | Admitting: Internal Medicine

## 2020-12-05 ENCOUNTER — Other Ambulatory Visit: Payer: Self-pay

## 2020-12-05 ENCOUNTER — Encounter: Payer: Self-pay | Admitting: Internal Medicine

## 2020-12-05 VITALS — BP 158/88 | HR 81 | Temp 98.2°F | Resp 16 | Ht 66.0 in | Wt 158.0 lb

## 2020-12-05 DIAGNOSIS — I10 Essential (primary) hypertension: Secondary | ICD-10-CM | POA: Diagnosis not present

## 2020-12-05 DIAGNOSIS — E785 Hyperlipidemia, unspecified: Secondary | ICD-10-CM | POA: Diagnosis not present

## 2020-12-05 DIAGNOSIS — Z0000 Encounter for general adult medical examination without abnormal findings: Secondary | ICD-10-CM | POA: Diagnosis not present

## 2020-12-05 DIAGNOSIS — E118 Type 2 diabetes mellitus with unspecified complications: Secondary | ICD-10-CM | POA: Diagnosis not present

## 2020-12-05 DIAGNOSIS — Z0001 Encounter for general adult medical examination with abnormal findings: Secondary | ICD-10-CM

## 2020-12-05 DIAGNOSIS — D171 Benign lipomatous neoplasm of skin and subcutaneous tissue of trunk: Secondary | ICD-10-CM

## 2020-12-05 DIAGNOSIS — E559 Vitamin D deficiency, unspecified: Secondary | ICD-10-CM | POA: Diagnosis not present

## 2020-12-05 LAB — HEPATIC FUNCTION PANEL
ALT: 18 U/L (ref 0–35)
AST: 17 U/L (ref 0–37)
Albumin: 4.5 g/dL (ref 3.5–5.2)
Alkaline Phosphatase: 61 U/L (ref 39–117)
Bilirubin, Direct: 0.1 mg/dL (ref 0.0–0.3)
Total Bilirubin: 0.5 mg/dL (ref 0.2–1.2)
Total Protein: 7.5 g/dL (ref 6.0–8.3)

## 2020-12-05 LAB — CBC WITH DIFFERENTIAL/PLATELET
Basophils Absolute: 0 10*3/uL (ref 0.0–0.1)
Basophils Relative: 0.5 % (ref 0.0–3.0)
Eosinophils Absolute: 0.1 10*3/uL (ref 0.0–0.7)
Eosinophils Relative: 1.5 % (ref 0.0–5.0)
HCT: 39.2 % (ref 36.0–46.0)
Hemoglobin: 13.6 g/dL (ref 12.0–15.0)
Lymphocytes Relative: 55.6 % — ABNORMAL HIGH (ref 12.0–46.0)
Lymphs Abs: 3.4 10*3/uL (ref 0.7–4.0)
MCHC: 34.6 g/dL (ref 30.0–36.0)
MCV: 88.3 fl (ref 78.0–100.0)
Monocytes Absolute: 0.7 10*3/uL (ref 0.1–1.0)
Monocytes Relative: 10.9 % (ref 3.0–12.0)
Neutro Abs: 1.9 10*3/uL (ref 1.4–7.7)
Neutrophils Relative %: 31.5 % — ABNORMAL LOW (ref 43.0–77.0)
Platelets: 318 10*3/uL (ref 150.0–400.0)
RBC: 4.44 Mil/uL (ref 3.87–5.11)
RDW: 13.5 % (ref 11.5–15.5)
WBC: 6 10*3/uL (ref 4.0–10.5)

## 2020-12-05 LAB — LIPID PANEL
Cholesterol: 122 mg/dL (ref 0–200)
HDL: 53.6 mg/dL (ref >39.00)
LDL Cholesterol: 58 mg/dL (ref 0–99)
NonHDL: 68.11
Total CHOL/HDL Ratio: 2
Triglycerides: 53 mg/dL (ref 0.0–149.0)
VLDL: 10.6 mg/dL (ref 0.0–40.0)

## 2020-12-05 LAB — MICROALBUMIN / CREATININE URINE RATIO
Creatinine,U: 63.7 mg/dL
Microalb Creat Ratio: 1.1 mg/g (ref 0.0–30.0)
Microalb, Ur: <0.7 mg/dL (ref 0.0–1.9)

## 2020-12-05 LAB — BASIC METABOLIC PANEL
BUN: 11 mg/dL (ref 6–23)
CO2: 30 mEq/L (ref 19–32)
Calcium: 9.9 mg/dL (ref 8.4–10.5)
Chloride: 102 mEq/L (ref 96–112)
Creatinine, Ser: 0.78 mg/dL (ref 0.40–1.20)
GFR: 80.27 mL/min (ref >60.00)
Glucose, Bld: 93 mg/dL (ref 70–99)
Potassium: 3.7 mEq/L (ref 3.5–5.1)
Sodium: 138 mEq/L (ref 135–145)

## 2020-12-05 LAB — URINALYSIS, ROUTINE W REFLEX MICROSCOPIC
Bilirubin Urine: NEGATIVE
Ketones, ur: NEGATIVE
Leukocytes,Ua: NEGATIVE
Nitrite: NEGATIVE
Specific Gravity, Urine: 1.01 (ref 1.000–1.030)
Total Protein, Urine: NEGATIVE
Urine Glucose: NEGATIVE
Urobilinogen, UA: 0.2 (ref 0.0–1.0)
pH: 7 (ref 5.0–8.0)

## 2020-12-05 LAB — HEMOGLOBIN A1C: Hgb A1c MFr Bld: 6.7 % — ABNORMAL HIGH (ref 4.6–6.5)

## 2020-12-05 LAB — VITAMIN D 25 HYDROXY (VIT D DEFICIENCY, FRACTURES): VITD: 66.28 ng/mL (ref 30.00–100.00)

## 2020-12-05 LAB — TSH: TSH: 0.61 u[IU]/mL (ref 0.35–4.50)

## 2020-12-05 NOTE — Progress Notes (Signed)
Subjective:  Patient ID: Lauren Orozco, female    DOB: Nov 28, 1955  Age: 65 y.o. MRN: [id number]  CC: Annual Exam, Diabetes, Hypertension, and Hyperlipidemia  This visit occurred during the SARS-CoV-2 public health emergency.  Safety protocols were in place, including screening questions prior to the visit, additional usage of staff PPE, and extensive cleaning of exam room while observing appropriate contact time as indicated for disinfecting solutions.    HPI Lauren Orozco presents for a CPX.  According to prescription refills she would no longer be compliant with her antihypertensives or her meds for diabetes mellitus.  She walks about 2 miles a day and does not experience CP, DOE, palpitations, or edema.  She complains of chronic fatigue and some recent weight gain.  She continues to smoke cigarettes.  She is concerned about a lipoma over her right supraclavicular area.  She said she was recently in a vascular surgeon's office with her sister and he took a look at it and said it might be too close to an artery to be remediable to surgery.  She remains concerned about it.  She continues to smoke cigarettes.  Outpatient Medications Prior to Visit  Medication Sig Dispense Refill   Accu-Chek Softclix Lancets lancets Use to check blood sugar daily. DX: E11.9 100 each 3   acetaminophen (TYLENOL) 500 MG tablet Take 500 mg by mouth every 6 (six) hours as needed.     Blood Glucose Monitoring Suppl (ACCU-CHEK GUIDE ME) w/Device KIT 1 Act by Does not apply route in the morning and at bedtime. 1 kit 0   Cholecalciferol 50 MCG (2000 UT) TABS Take 2 tablets (4,000 Units total) by mouth daily. 180 tablet 1   gabapentin (NEURONTIN) 300 MG capsule Take 1 capsule (300 mg total) by mouth 3 (three) times daily as needed (nerve pain). 90 capsule 3   glucose blood (ACCU-CHEK GUIDE) test strip Use to check blood sugar daily. DX: E11.9 100 each 3   Dapagliflozin-metFORMIN HCl ER (XIGDUO  XR) 10-500 MG TB24 Take 1 tablet by mouth daily. 90 tablet 1   indapamide (LOZOL) 1.25 MG tablet Take 1 tablet (1.25 mg total) by mouth daily. 90 tablet 1   irbesartan (AVAPRO) 150 MG tablet TAKE 1 TABLET(150 MG) BY MOUTH DAILY 90 tablet 0   meloxicam (MOBIC) 7.5 MG tablet Take 1 tablet (7.5 mg total) by mouth daily. 90 tablet 1   predniSONE (DELTASONE) 50 MG tablet Take 1 tablet (50 mg total) by mouth daily. 5 tablet 0   rosuvastatin (CRESTOR) 20 MG tablet TAKE ONE TABLET BY MOUTH ONCE DAILY AT BEDTIME 90 tablet 1   No facility-administered medications prior to visit.    ROS Review of Systems  Constitutional: Positive for fatigue and unexpected weight change (wt gain). Negative for appetite change, chills, diaphoresis and fever.  HENT: Negative.  Negative for trouble swallowing.   Eyes: Negative.   Respiratory: Negative for cough, chest tightness, shortness of breath and wheezing.   Cardiovascular: Negative for chest pain, palpitations and leg swelling.  Gastrointestinal: Negative for abdominal pain, constipation, diarrhea, nausea and vomiting.  Endocrine: Negative.   Genitourinary: Negative.  Negative for difficulty urinating.  Musculoskeletal: Negative.  Negative for arthralgias and myalgias.  Skin: Negative.  Negative for color change, pallor and rash.  Neurological: Negative.  Negative for dizziness, weakness, light-headedness and numbness.  Hematological: Negative for adenopathy. Does not bruise/bleed easily.  Psychiatric/Behavioral: Negative.     Objective:  BP (!) 158/88    Pulse 81  Temp 98.2 F (36.8 C) (Oral)    Resp 16    Ht 5' 6"  (1.676 m)    Wt 158 lb (71.7 kg)    SpO2 97%    BMI 25.50 kg/m   BP Readings from Last 3 Encounters:  12/05/20 (!) 158/88  07/11/20 136/84  06/19/20 120/72    Wt Readings from Last 3 Encounters:  12/05/20 158 lb (71.7 kg)  07/11/20 153 lb (69.4 kg)  06/19/20 154 lb 9.6 oz (70.1 kg)    Physical Exam Vitals reviewed.   Constitutional:      Appearance: Normal appearance.  HENT:     Nose: Nose normal.     Mouth/Throat:     Mouth: Mucous membranes are moist.  Eyes:     General: No scleral icterus.    Conjunctiva/sclera: Conjunctivae normal.  Cardiovascular:     Rate and Rhythm: Normal rate and regular rhythm.     Heart sounds: No murmur heard.   Pulmonary:     Effort: Pulmonary effort is normal.     Breath sounds: No stridor. No wheezing, rhonchi or rales.  Chest:     Chest wall: Mass present. No tenderness.    Abdominal:     General: Abdomen is flat.     Palpations: There is no mass.     Tenderness: There is no abdominal tenderness. There is no guarding or rebound.  Musculoskeletal:        General: Normal range of motion.     Cervical back: Neck supple.     Right lower leg: No edema.     Left lower leg: No edema.  Lymphadenopathy:     Cervical: No cervical adenopathy.  Skin:    General: Skin is warm and dry.     Coloration: Skin is not pale.  Neurological:     General: No focal deficit present.     Mental Status: She is alert.  Psychiatric:        Mood and Affect: Mood normal.        Behavior: Behavior normal.     Lab Results  Component Value Date   WBC 6.0 12/05/2020   HGB 13.6 12/05/2020   HCT 39.2 12/05/2020   PLT 318.0 12/05/2020   GLUCOSE 93 12/05/2020   CHOL 122 12/05/2020   TRIG 53.0 12/05/2020   HDL 53.60 12/05/2020   LDLDIRECT 150.8 10/19/2011   LDLCALC 58 12/05/2020   ALT 18 12/05/2020   AST 17 12/05/2020   NA 138 12/05/2020   K 3.7 12/05/2020   CL 102 12/05/2020   CREATININE 0.78 12/05/2020   BUN 11 12/05/2020   CO2 30 12/05/2020   TSH 0.61 12/05/2020   HGBA1C 6.7 (H) 12/05/2020   MICROALBUR <0.7 12/05/2020    CT CHEST LUNG CA SCREEN LOW DOSE W/O CM  Result Date: 04/23/2020 CLINICAL DATA:  65 year old female with 45 pack year history of smoking. Lung cancer screening. EXAM: CT CHEST WITHOUT CONTRAST LOW-DOSE FOR LUNG CANCER SCREENING TECHNIQUE:  Multidetector CT imaging of the chest was performed following the standard protocol without IV contrast. COMPARISON:  04/21/2019 FINDINGS: Cardiovascular: The heart size is normal. No substantial pericardial effusion. Coronary artery calcification is evident. Atherosclerotic calcification is noted in the wall of the thoracic aorta. Mediastinum/Nodes: No mediastinal lymphadenopathy. No evidence for gross hilar lymphadenopathy although assessment is limited by the lack of intravenous contrast on today's study. The esophagus has normal imaging features. There is no axillary lymphadenopathy. Lungs/Pleura: Centrilobular emphsyema noted. Stable 3.8 mm perifissural right  upper lobe pulmonary nodule. No new suspicious pulmonary nodule or mass. No focal airspace consolidation. No pleural effusion. Upper Abdomen: Stable 2.8 cm right adrenal adenoma Musculoskeletal: No worrisome lytic or sclerotic osseous abnormality. IMPRESSION: 1. Lung-RADS 2, benign appearance or behavior. Continue annual screening with low-dose chest CT without contrast in 12 months. 2. Stable right adrenal adenoma. 3. Aortic Atherosclerosis (ICD10-I70.0) and Emphysema (ICD10-J43.9). Electronically Signed   By: Misty Stanley M.D.   On: 04/23/2020 08:20    Assessment & Plan:   Gratia was seen today for annual exam, diabetes, hypertension and hyperlipidemia.  Diagnoses and all orders for this visit:  Essential hypertension- Her blood pressure is not adequately well controlled due to noncompliance.  Will restart the ARB and thiazide diuretic. -     CBC with Differential/Platelet; Future -     TSH; Future -     Urinalysis, Routine w reflex microscopic; Future -     Hepatic function panel; Future -     Hepatic function panel -     Urinalysis, Routine w reflex microscopic -     TSH -     CBC with Differential/Platelet -     indapamide (LOZOL) 1.25 MG tablet; Take 1 tablet (1.25 mg total) by mouth daily. -     irbesartan (AVAPRO) 150 MG tablet;  Take 1 tablet (150 mg total) by mouth daily.  Type II diabetes mellitus with manifestations ([place])- Her A1c is at 6.7%.  Will restart Metformin and the SGLT2 inhibitor. -     Basic metabolic panel; Future -     Microalbumin / creatinine urine ratio; Future -     Hemoglobin A1c; Future -     Urinalysis, Routine w reflex microscopic; Future -     Urinalysis, Routine w reflex microscopic -     Hemoglobin A1c -     Microalbumin / creatinine urine ratio -     Basic metabolic panel -     Dapagliflozin-metFORMIN HCl ER (XIGDUO XR) 10-500 MG TB24; Take 1 tablet by mouth daily. -     irbesartan (AVAPRO) 150 MG tablet; Take 1 tablet (150 mg total) by mouth daily.  Hyperlipidemia with target LDL less than 130- She has achieved her LDL goal and is doing well on the statin. -     Lipid panel; Future -     TSH; Future -     Hepatic function panel; Future -     Hepatic function panel -     TSH -     Lipid panel -     rosuvastatin (CRESTOR) 20 MG tablet; Take 1 tablet (20 mg total) by mouth daily with breakfast.  Encounter for general adult medical examination with abnormal findings- Exam completed, labs reviewed, vaccines reviewed and updated, cancer screenings are up-to-date, patient education was given.  Vitamin D deficiency disease -     VITAMIN D 25 Hydroxy (Vit-D Deficiency, Fractures); Future -     VITAMIN D 25 Hydroxy (Vit-D Deficiency, Fractures)   I have discontinued [name]. [place]all's meloxicam and predniSONE. I have also changed her rosuvastatin and irbesartan. Additionally, I am having her maintain her Cholecalciferol, acetaminophen, Accu-Chek Guide Me, Accu-Chek Softclix Lancets, Accu-Chek Guide, gabapentin, Xigduo XR, and indapamide.  Meds ordered this encounter  Medications   Dapagliflozin-metFORMIN HCl ER (XIGDUO XR) 10-500 MG TB24    Sig: Take 1 tablet by mouth daily.    Dispense:  90 tablet    Refill:  1   indapamide (LOZOL) 1.25 MG tablet  Sig: Take 1 tablet (1.25 mg  total) by mouth daily.    Dispense:  90 tablet    Refill:  1   rosuvastatin (CRESTOR) 20 MG tablet    Sig: Take 1 tablet (20 mg total) by mouth daily with breakfast.    Dispense:  90 tablet    Refill:  1    This prescription was filled on 04/29/2020. Any refills authorized will be placed on file.   irbesartan (AVAPRO) 150 MG tablet    Sig: Take 1 tablet (150 mg total) by mouth daily.    Dispense:  90 tablet    Refill:  0     Follow-up: Return in about 3 months (around 03/07/2021).  Scarlette Calico, MD

## 2020-12-05 NOTE — Patient Instructions (Signed)
Health Maintenance, Female Adopting a healthy lifestyle and getting preventive care are important in promoting health and wellness. Ask your health care provider about:  The right schedule for you to have regular tests and exams.  Things you can do on your own to prevent diseases and keep yourself healthy. What should I know about diet, weight, and exercise? Eat a healthy diet  Eat a diet that includes plenty of vegetables, fruits, low-fat dairy products, and lean protein.  Do not eat a lot of foods that are high in solid fats, added sugars, or sodium.   Maintain a healthy weight Body mass index (BMI) is used to identify weight problems. It estimates body fat based on height and weight. Your health care provider can help determine your BMI and help you achieve or maintain a healthy weight. Get regular exercise Get regular exercise. This is one of the most important things you can do for your health. Most adults should:  Exercise for at least 150 minutes each week. The exercise should increase your heart rate and make you sweat (moderate-intensity exercise).  Do strengthening exercises at least twice a week. This is in addition to the moderate-intensity exercise.  Spend less time sitting. Even light physical activity can be beneficial. Watch cholesterol and blood lipids Have your blood tested for lipids and cholesterol at 65 years of age, then have this test every 5 years. Have your cholesterol levels checked more often if:  Your lipid or cholesterol levels are high.  You are older than 65 years of age.  You are at high risk for heart disease. What should I know about cancer screening? Depending on your health history and family history, you may need to have cancer screening at various ages. This may include screening for:  Breast cancer.  Cervical cancer.  Colorectal cancer.  Skin cancer.  Lung cancer. What should I know about heart disease, diabetes, and high blood  pressure? Blood pressure and heart disease  High blood pressure causes heart disease and increases the risk of stroke. This is more likely to develop in people who have high blood pressure readings, are of African descent, or are overweight.  Have your blood pressure checked: ? Every 3-5 years if you are 18-39 years of age. ? Every year if you are 40 years old or older. Diabetes Have regular diabetes screenings. This checks your fasting blood sugar level. Have the screening done:  Once every three years after age 40 if you are at a normal weight and have a low risk for diabetes.  More often and at a younger age if you are overweight or have a high risk for diabetes. What should I know about preventing infection? Hepatitis B If you have a higher risk for hepatitis B, you should be screened for this virus. Talk with your health care provider to find out if you are at risk for hepatitis B infection. Hepatitis C Testing is recommended for:  Everyone born from 1945 through 1965.  Anyone with known risk factors for hepatitis C. Sexually transmitted infections (STIs)  Get screened for STIs, including gonorrhea and chlamydia, if: ? You are sexually active and are younger than 65 years of age. ? You are older than 65 years of age and your health care provider tells you that you are at risk for this type of infection. ? Your sexual activity has changed since you were last screened, and you are at increased risk for chlamydia or gonorrhea. Ask your health care provider   if you are at risk.  Ask your health care provider about whether you are at high risk for HIV. Your health care provider may recommend a prescription medicine to help prevent HIV infection. If you choose to take medicine to prevent HIV, you should first get tested for HIV. You should then be tested every 3 months for as long as you are taking the medicine. Pregnancy  If you are about to stop having your period (premenopausal) and  you may become pregnant, seek counseling before you get pregnant.  Take 400 to 800 micrograms (mcg) of folic acid every day if you become pregnant.  Ask for birth control (contraception) if you want to prevent pregnancy. Osteoporosis and menopause Osteoporosis is a disease in which the bones lose minerals and strength with aging. This can result in bone fractures. If you are 65 years old or older, or if you are at risk for osteoporosis and fractures, ask your health care provider if you should:  Be screened for bone loss.  Take a calcium or vitamin D supplement to lower your risk of fractures.  Be given hormone replacement therapy (HRT) to treat symptoms of menopause. Follow these instructions at home: Lifestyle  Do not use any products that contain nicotine or tobacco, such as cigarettes, e-cigarettes, and chewing tobacco. If you need help quitting, ask your health care provider.  Do not use street drugs.  Do not share needles.  Ask your health care provider for help if you need support or information about quitting drugs. Alcohol use  Do not drink alcohol if: ? Your health care provider tells you not to drink. ? You are pregnant, may be pregnant, or are planning to become pregnant.  If you drink alcohol: ? Limit how much you use to 0-1 drink a day. ? Limit intake if you are breastfeeding.  Be aware of how much alcohol is in your drink. In the [place] one drink equals one 12 oz bottle of beer (355 mL), one 5 oz glass of wine (148 mL), or one 1 oz glass of hard liquor (44 mL). General instructions  Schedule regular health, dental, and eye exams.  Stay current with your vaccines.  Tell your health care provider if: ? You often feel depressed. ? You have ever been abused or do not feel safe at home. Summary  Adopting a healthy lifestyle and getting preventive care are important in promoting health and wellness.  Follow your health care provider's instructions about healthy  diet, exercising, and getting tested or screened for diseases.  Follow your health care provider's instructions on monitoring your cholesterol and blood pressure. This information is not intended to replace advice given to you by your health care provider. Make sure you discuss any questions you have with your health care provider. Document Revised: 09/14/2018 Document Reviewed: 09/14/2018 Elsevier Patient Education  2021 [employer]vier Inc.  

## 2020-12-06 DIAGNOSIS — D171 Benign lipomatous neoplasm of skin and subcutaneous tissue of trunk: Secondary | ICD-10-CM | POA: Insufficient documentation

## 2020-12-06 MED ORDER — XIGDUO XR 10-500 MG PO TB24: 1.0000 | ORAL_TABLET | Freq: Every day | ORAL | 1 refills | Status: DC

## 2020-12-06 MED ORDER — ROSUVASTATIN CALCIUM 20 MG PO TABS: 20.0000 mg | ORAL_TABLET | Freq: Every day | ORAL | 1 refills | Status: DC

## 2020-12-06 MED ORDER — INDAPAMIDE 1.25 MG PO TABS: 1.2500 mg | ORAL_TABLET | Freq: Every day | ORAL | 1 refills | Status: DC

## 2020-12-06 MED ORDER — IRBESARTAN 150 MG PO TABS: 150.0000 mg | ORAL_TABLET | Freq: Every day | ORAL | 0 refills | Status: DC

## 2020-12-23 ENCOUNTER — Encounter: Payer: Self-pay | Admitting: Internal Medicine

## 2020-12-24 ENCOUNTER — Other Ambulatory Visit: Payer: Self-pay | Admitting: Internal Medicine

## 2020-12-24 DIAGNOSIS — E559 Vitamin D deficiency, unspecified: Secondary | ICD-10-CM

## 2020-12-24 MED ORDER — CHOLECALCIFEROL 50 MCG (2000 UT) PO TABS
2.0000 | ORAL_TABLET | Freq: Every day | ORAL | 1 refills | Status: AC
Start: 2020-12-24 — End: ?

## 2020-12-27 ENCOUNTER — Encounter: Payer: Medicare Other | Admitting: Thoracic Surgery (Cardiothoracic Vascular Surgery)

## 2020-12-27 ENCOUNTER — Other Ambulatory Visit: Payer: Self-pay

## 2020-12-27 VITALS — BP 153/97 | HR 80 | Resp 20 | Ht 66.0 in | Wt 158.0 lb

## 2021-01-01 ENCOUNTER — Other Ambulatory Visit: Payer: Self-pay | Admitting: Surgery

## 2021-01-01 DIAGNOSIS — D1720 Benign lipomatous neoplasm of skin and subcutaneous tissue of unspecified limb: Secondary | ICD-10-CM

## 2021-01-25 ENCOUNTER — Other Ambulatory Visit: Payer: Medicare Other

## 2021-02-10 ENCOUNTER — Ambulatory Visit
Admission: RE | Admit: 2021-02-10 | Discharge: 2021-02-10 | Disposition: A | Discharge status: Home / Self Care | Payer: Medicare Other | Source: Ambulatory Visit | Attending: Surgery | Admitting: Surgery

## 2021-02-10 ENCOUNTER — Other Ambulatory Visit: Payer: Self-pay

## 2021-02-10 DIAGNOSIS — D1720 Benign lipomatous neoplasm of skin and subcutaneous tissue of unspecified limb: Secondary | ICD-10-CM

## 2021-02-10 DIAGNOSIS — L989 Disorder of the skin and subcutaneous tissue, unspecified: Secondary | ICD-10-CM | POA: Diagnosis not present

## 2021-02-10 DIAGNOSIS — Z981 Arthrodesis status: Secondary | ICD-10-CM | POA: Diagnosis not present

## 2021-02-10 DIAGNOSIS — R221 Localized swelling, mass and lump, neck: Secondary | ICD-10-CM | POA: Diagnosis not present

## 2021-02-10 MED ORDER — GADOBENATE DIMEGLUMINE 529 MG/ML IV SOLN
14.0000 mL | Freq: Once | INTRAVENOUS | Status: AC | PRN
  Administered 2021-02-10: 14 mL via INTRAVENOUS

## 2021-02-13 ENCOUNTER — Other Ambulatory Visit: Payer: Self-pay | Admitting: Internal Medicine

## 2021-02-13 DIAGNOSIS — I10 Essential (primary) hypertension: Secondary | ICD-10-CM

## 2021-03-01 ENCOUNTER — Encounter (HOSPITAL_BASED_OUTPATIENT_CLINIC_OR_DEPARTMENT_OTHER): Payer: Self-pay | Admitting: *Deleted

## 2021-03-01 ENCOUNTER — Other Ambulatory Visit: Payer: Self-pay

## 2021-03-01 ENCOUNTER — Emergency Department (HOSPITAL_BASED_OUTPATIENT_CLINIC_OR_DEPARTMENT_OTHER)
Admission: EM | Admit: 2021-03-01 | Discharge: 2021-03-01 | Disposition: A | Discharge status: Home / Self Care | Payer: Medicare Other | Attending: Emergency Medicine | Admitting: Emergency Medicine

## 2021-03-01 DIAGNOSIS — Z87891 Personal history of nicotine dependence: Secondary | ICD-10-CM | POA: Insufficient documentation

## 2021-03-01 DIAGNOSIS — Z8572 Personal history of non-Hodgkin lymphomas: Secondary | ICD-10-CM | POA: Diagnosis not present

## 2021-03-01 DIAGNOSIS — M25511 Pain in right shoulder: Secondary | ICD-10-CM | POA: Diagnosis present

## 2021-03-01 DIAGNOSIS — E1169 Type 2 diabetes mellitus with other specified complication: Secondary | ICD-10-CM | POA: Insufficient documentation

## 2021-03-01 DIAGNOSIS — E785 Hyperlipidemia, unspecified: Secondary | ICD-10-CM | POA: Insufficient documentation

## 2021-03-01 DIAGNOSIS — Z7984 Long term (current) use of oral hypoglycemic drugs: Secondary | ICD-10-CM | POA: Insufficient documentation

## 2021-03-01 DIAGNOSIS — R000 Tachycardia, unspecified: Secondary | ICD-10-CM | POA: Diagnosis not present

## 2021-03-01 DIAGNOSIS — Z86018 Personal history of other benign neoplasm: Secondary | ICD-10-CM | POA: Insufficient documentation

## 2021-03-01 DIAGNOSIS — M25531 Pain in right wrist: Secondary | ICD-10-CM | POA: Diagnosis not present

## 2021-03-01 DIAGNOSIS — M62838 Other muscle spasm: Secondary | ICD-10-CM | POA: Diagnosis not present

## 2021-03-01 DIAGNOSIS — M79644 Pain in right finger(s): Secondary | ICD-10-CM | POA: Insufficient documentation

## 2021-03-01 MED ORDER — ACETAMINOPHEN 500 MG PO TABS
1000.0000 mg | ORAL_TABLET | Freq: Once | ORAL | Status: AC
  Administered 2021-03-01: 1000 mg via ORAL
  Filled 2021-03-01: qty 2

## 2021-03-01 MED ORDER — OXYCODONE HCL 5 MG PO TABS
5.0000 mg | ORAL_TABLET | Freq: Once | ORAL | Status: AC
  Administered 2021-03-01: 5 mg via ORAL
  Filled 2021-03-01: qty 1

## 2021-03-01 MED ORDER — KETOROLAC TROMETHAMINE 60 MG/2ML IM SOLN
15.0000 mg | Freq: Once | INTRAMUSCULAR | Status: AC
  Administered 2021-03-01: 15 mg via INTRAMUSCULAR
  Filled 2021-03-01: qty 2

## 2021-03-01 MED ORDER — PREDNISONE 50 MG PO TABS
60.0000 mg | ORAL_TABLET | Freq: Once | ORAL | Status: AC
  Administered 2021-03-01: 60 mg via ORAL
  Filled 2021-03-01: qty 1

## 2021-03-01 MED ORDER — DICLOFENAC SODIUM 1 % EX GEL: 4.0000 g | Freq: Four times a day (QID) | CUTANEOUS | 0 refills | Status: DC

## 2021-03-01 MED ORDER — PREDNISONE 20 MG PO TABS: ORAL_TABLET | ORAL | 0 refills | Status: DC

## 2021-03-01 NOTE — ED Triage Notes (Signed)
Right arm pain from neck down to fingers.  Hx of same-worse today. Denies known injury. Report C3-C7 fusion with arm pain associated with that.

## 2021-03-01 NOTE — Discharge Instructions (Addendum)
Use the gel as prescribed Also take tylenol 1000mg(2 extra strength) four times a day.    

## 2021-03-01 NOTE — ED Provider Notes (Signed)
[facility] Provider Note   CSN: [id number] Arrival date & time: 03/01/21  0016     History Chief Complaint  Patient presents with  . Arm Pain    Lauren Orozco is a 65 y.o. female.  65 yo F with a chief complaints of right shoulder pain.  This is been an ongoing issue for her.  Has had it ever since she had a procedure performed.  Feels like it is gotten worse over the past couple days.  She has been taking gabapentin for it without improvement.  She also has pain to the MTP of the right first digit.  Worse with even light touch to the area.  Denies trauma.  Denies numbness or weakness.  The history is provided by the patient.  Arm Pain This is a new problem. The current episode started yesterday. The problem occurs constantly. The problem has not changed since onset.Pertinent negatives include no chest pain, no headaches and no shortness of breath. Nothing aggravates the symptoms. Nothing relieves the symptoms. She has tried nothing for the symptoms. The treatment provided no relief.       Past Medical History:  Diagnosis Date  . Anxiety   . Chronic neck pain    with radiculopathy  . Chronic pain of left wrist 2009   "since neck surgery"  . Complication of anesthesia    Hard to wake up per pt; after neck sx had to be bagged.  Marland Kitchen DEPRESSION   . DISC DISEASE, CERVICAL   . Diverticulitis   . HYPERLIPIDEMIA   . Hypertension   . Lymphocytosis 01/02/2016  . NEPHROLITHIASIS, HX OF   . NHL (non-Hodgkin's lymphoma) ([place]) 12/25/2014  . Recurrent abdominal pain   . Umbilical hernia   . UTI'S, CHRONIC     Patient Active Problem List   Diagnosis Date Noted  . Lipoma of chest wall 12/06/2020  . Chronic pain of left knee 01/30/2020  . Primary osteoarthritis of both knees 01/30/2020  . Nephrolithiasis 04/05/2019  . Vitamin D deficiency disease 12/20/2018  . Type II diabetes mellitus with manifestations ([place]) 10/13/2016  . Essential  hypertension 01/02/2016  . Bilateral carpal tunnel syndrome 01/29/2015  . Low grade lymphoma, stage I ([facility]) 12/25/2014  . Visit for screening mammogram 08/24/2014  . Constipation 10/03/2013  . Encounter for general adult medical examination with abnormal findings 10/20/2011  . Tobacco abuse 10/19/2011  . Hyperlipidemia with target LDL less than 130 12/22/2010  . Generalized OA 12/22/2010  . [place] DISEASE, CERVICAL 12/22/2010    Past Surgical History:  Procedure Laterality Date  . ABDOMINAL HYSTERECTOMY  2001  . APPENDECTOMY  1981  . KIDNEY STONE SURGERY  2007  . LEFT OOPHORECTOMY  2001  . MYOMECTOMY  1981  . NECK SURGERY  2009   fusion of C1, C2, C3     OB History   No obstetric history on file.     Family History  Problem Relation Age of Onset  . Hypertension Mother   . Diabetes Mother   . Hyperlipidemia Mother   . Heart attack Father        MI  . Hyperlipidemia Father   . Hypertension Father   . Diabetes Father   . Heart disease Father   . Cancer Neg Hx        Negative for Breast or Colon Cancer  . Colon cancer Neg Hx     Social History   Tobacco Use  . Smoking status: Former Smoker  Packs/day: 0.90    Years: 30.00    Pack years: 27.00    Types: Cigarettes    Quit date: 12/18/2019    Years since quitting: 1.2  . Smokeless tobacco: Never Used  Substance Use Topics  . Alcohol use: No  . Drug use: No    Home Medications Prior to Admission medications   Medication Sig Start Date End Date Taking? Authorizing Provider  diclofenac Sodium (VOLTAREN) 1 % GEL Apply 4 g topically 4 (four) times daily. 03/01/21  Yes Deno Etienne, DO  predniSONE (DELTASONE) 20 MG tablet 2 tabs po daily x 4 days 03/01/21  Yes Deno Etienne, DO  Accu-Chek Softclix Lancets lancets Use to check blood sugar daily. DX: E11.9 04/26/20   Janith Lima, MD  acetaminophen (TYLENOL) 500 MG tablet Take 500 mg by mouth every 6 (six) hours as needed.    [provider]  Blood Glucose  Monitoring Suppl (ACCU-CHEK GUIDE ME) w/Device KIT 1 Act by Does not apply route in the morning and at bedtime. 02/08/20   Janith Lima, MD  Cholecalciferol 50 MCG (2000 UT) TABS Take 2 tablets (4,000 Units total) by mouth daily. 12/24/20   Janith Lima, MD  Dapagliflozin-metFORMIN HCl ER (XIGDUO XR) 10-500 MG TB24 Take 1 tablet by mouth daily. 12/06/20   Janith Lima, MD  gabapentin (NEURONTIN) 300 MG capsule Take 1 capsule (300 mg total) by mouth 3 (three) times daily as needed (nerve pain). 06/19/20   Gregor Hams, MD  glucose blood (ACCU-CHEK GUIDE) test strip Use to check blood sugar daily. DX: E11.9 04/26/20   Janith Lima, MD  indapamide (LOZOL) 1.25 MG tablet TAKE 1 TABLET BY MOUTH EVERY DAY 02/13/21   Janith Lima, MD  irbesartan (AVAPRO) 150 MG tablet Take 1 tablet (150 mg total) by mouth daily. 12/06/20   Janith Lima, MD  rosuvastatin (CRESTOR) 20 MG tablet Take 1 tablet (20 mg total) by mouth daily with breakfast. 12/06/20   Janith Lima, MD    Allergies    Ciprofloxacin, Ofloxacin, and Other  Review of Systems   Review of Systems  Constitutional: Negative for chills and fever.  HENT: Negative for congestion and rhinorrhea.   Eyes: Negative for redness and visual disturbance.  Respiratory: Negative for shortness of breath and wheezing.   Cardiovascular: Negative for chest pain and palpitations.  Gastrointestinal: Negative for nausea and vomiting.  Genitourinary: Negative for dysuria and urgency.  Musculoskeletal: Positive for arthralgias and myalgias.  Skin: Negative for pallor and wound.  Neurological: Negative for dizziness and headaches.    Physical Exam Updated Vital Signs BP (!) 150/104 (BP Location: Left Arm)   Pulse 77   Temp 98.6 F (37 C) (Oral)   Resp 18   Ht 5' 6"  (1.676 m)   Wt 70.8 kg   SpO2 100%   BMI 25.18 kg/m   Physical Exam Vitals and nursing note reviewed.  Constitutional:      General: She is not in acute distress.    Appearance:  She is well-developed. She is not diaphoretic.  HENT:     Head: Normocephalic and atraumatic.  Eyes:     Pupils: Pupils are equal, round, and reactive to light.  Cardiovascular:     Rate and Rhythm: Normal rate and regular rhythm.     Heart sounds: No murmur heard. No friction rub. No gallop.   Pulmonary:     Effort: Pulmonary effort is normal.     Breath  sounds: No wheezing or rales.  Abdominal:     General: There is no distension.     Palpations: Abdomen is soft.     Tenderness: There is no abdominal tenderness.  Musculoskeletal:        General: Tenderness present.     Cervical back: Normal range of motion and neck supple.     Comments: Tenderness about the muscle belly of the right trapezius.  There is a soft tissue mass in the supraclavicular space on the right.  Pulse motor and sensation intact distally.  Pain with light touch to the first MTP on the right.  No pain with range of motion of the wrist.  Negative Spurling's test.  Skin:    General: Skin is warm and dry.  Neurological:     Mental Status: She is alert and oriented to person, place, and time.  Psychiatric:        Behavior: Behavior normal.     ED Results / Procedures / Treatments   Labs (all labs ordered are listed, but only abnormal results are displayed) Labs Reviewed - No data to display  EKG None  Radiology No results found.  Procedures Procedures   Medications Ordered in ED Medications  acetaminophen (TYLENOL) tablet 1,000 mg (has no administration in time range)  ketorolac (TORADOL) injection 15 mg (has no administration in time range)  oxyCODONE (Oxy IR/ROXICODONE) immediate release tablet 5 mg (has no administration in time range)  predniSONE (DELTASONE) tablet 60 mg (has no administration in time range)    ED Course  I have reviewed the triage vital signs and the nursing notes.  Pertinent labs & imaging results that were available during my care of the patient were reviewed by me and  considered in my medical decision making (see chart for details).    MDM Rules/Calculators/A&P                          65 yo F with a chief complaints of right shoulder pain.  This is a chronic problem for her, has been going on for years.  She feels like the pain is the same but did not improve with her normal medications and she was having trouble sleeping tonight and so came in to be evaluated.  No recent trauma.  She has a soft tissue mass its been evaluated by CT surgery.  We will treat her pain here.  Burst dose of steroids.  Topical NSAIDs.  1:34 AM:  I have discussed the diagnosis/risks/treatment options with the patient and family and believe the pt to be eligible for discharge home to follow-up with PCP. We also discussed returning to the ED immediately if new or worsening sx occur. We discussed the sx which are most concerning (e.g., sudden worsening pain, fever, inability to tolerate by mouth) that necessitate immediate return. Medications administered to the patient during their visit and any new prescriptions provided to the patient are listed below.  Medications given during this visit Medications  acetaminophen (TYLENOL) tablet 1,000 mg (has no administration in time range)  ketorolac (TORADOL) injection 15 mg (has no administration in time range)  oxyCODONE (Oxy IR/ROXICODONE) immediate release tablet 5 mg (has no administration in time range)  predniSONE (DELTASONE) tablet 60 mg (has no administration in time range)     The patient appears reasonably screen and/or stabilized for discharge and I doubt any other medical condition or other [facility] requiring further screening, evaluation, or treatment in the  ED at this time prior to discharge.   Final Clinical Impression(s) / ED Diagnoses Final diagnoses:  Trapezius muscle spasm  Pain of right thumb    Rx / DC Orders ED Discharge Orders         Ordered    diclofenac Sodium (VOLTAREN) 1 % GEL  4 times daily        03/01/21  0128    predniSONE (DELTASONE) 20 MG tablet        03/01/21 0128           Deno Etienne, DO 03/01/21 0134

## 2021-03-01 NOTE — ED Notes (Signed)
Pt verbalizes understanding of discharge instructions. Opportunity for questioning and answers were provided. Armand removed by staff, pt discharged from ED to home. Instructed to pick up Rx.  

## 2021-03-13 DIAGNOSIS — H40053 Ocular hypertension, bilateral: Secondary | ICD-10-CM | POA: Diagnosis not present

## 2021-03-13 DIAGNOSIS — H52203 Unspecified astigmatism, bilateral: Secondary | ICD-10-CM | POA: Diagnosis not present

## 2021-03-13 DIAGNOSIS — H2513 Age-related nuclear cataract, bilateral: Secondary | ICD-10-CM | POA: Diagnosis not present

## 2021-03-13 DIAGNOSIS — E119 Type 2 diabetes mellitus without complications: Secondary | ICD-10-CM | POA: Diagnosis not present

## 2021-03-13 LAB — HM DIABETES EYE EXAM

## 2021-03-17 ENCOUNTER — Telehealth: Payer: Self-pay

## 2021-03-17 NOTE — Telephone Encounter (Signed)
Called and reviewed upcoming appts. She verbalized understanding. 

## 2021-03-17 NOTE — Telephone Encounter (Signed)
-----   Message from Heath Lark, MD sent at 03/17/2021  7:41 AM EDT ----- Regarding: non urgent Can you help her call to scehdule CT scan to be done on 7/20

## 2021-03-25 ENCOUNTER — Telehealth: Payer: Self-pay | Admitting: Internal Medicine

## 2021-03-25 NOTE — Telephone Encounter (Signed)
    Patient is requesting a referral for a neurologist. She said that she has a cage around her neck and the pain is worse if she takes the cage off. Please advise

## 2021-03-28 NOTE — Telephone Encounter (Signed)
Pt said that PCP should know what she is talking about because she has had surgery. She questioned if I was calling from [place] location and if I was PCPs assistant. I stated that I was his CMA and PCP wanted clarification on what she meant by "cage". She doesn't understand why its so hard to get an OV with her PCP. She stated that she also has 2 sister that is seen here that can also never get in. I explained that he typically is booked out a month. She expressed well she guess she will have to find a new doctor. She then asked if he works on [place] now. I responded saying that PCP typically does not work on [place] but occasionally comes in to do some chart work. She was not pleased by that response. She demands a referral to neurology or to be seen prior to the scheduled OV that she has to discuss.   Please advise.

## 2021-03-29 ENCOUNTER — Encounter (HOSPITAL_BASED_OUTPATIENT_CLINIC_OR_DEPARTMENT_OTHER): Payer: Self-pay | Admitting: *Deleted

## 2021-03-29 ENCOUNTER — Emergency Department (HOSPITAL_BASED_OUTPATIENT_CLINIC_OR_DEPARTMENT_OTHER)
Admission: EM | Admit: 2021-03-29 | Discharge: 2021-03-29 | Disposition: A | Discharge status: Home / Self Care | Payer: Medicare Other | Attending: Emergency Medicine | Admitting: Emergency Medicine

## 2021-03-29 ENCOUNTER — Other Ambulatory Visit: Payer: Self-pay

## 2021-03-29 DIAGNOSIS — Z87891 Personal history of nicotine dependence: Secondary | ICD-10-CM | POA: Insufficient documentation

## 2021-03-29 DIAGNOSIS — M542 Cervicalgia: Secondary | ICD-10-CM | POA: Diagnosis present

## 2021-03-29 DIAGNOSIS — E119 Type 2 diabetes mellitus without complications: Secondary | ICD-10-CM | POA: Insufficient documentation

## 2021-03-29 DIAGNOSIS — M62838 Other muscle spasm: Secondary | ICD-10-CM | POA: Diagnosis not present

## 2021-03-29 DIAGNOSIS — I10 Essential (primary) hypertension: Secondary | ICD-10-CM | POA: Insufficient documentation

## 2021-03-29 DIAGNOSIS — Z79899 Other long term (current) drug therapy: Secondary | ICD-10-CM | POA: Insufficient documentation

## 2021-03-29 DIAGNOSIS — Z7984 Long term (current) use of oral hypoglycemic drugs: Secondary | ICD-10-CM | POA: Insufficient documentation

## 2021-03-29 MED ORDER — CYCLOBENZAPRINE HCL 5 MG PO TABS
5.0000 mg | ORAL_TABLET | Freq: Once | ORAL | Status: AC
  Administered 2021-03-29: 5 mg via ORAL
  Filled 2021-03-29: qty 1

## 2021-03-29 MED ORDER — LIDOCAINE 5 % EX PTCH
1.0000 | MEDICATED_PATCH | CUTANEOUS | Status: DC
  Administered 2021-03-29: 1 via TRANSDERMAL
  Filled 2021-03-29: qty 1

## 2021-03-29 MED ORDER — DICLOFENAC SODIUM 1 % EX GEL
2.0000 g | Freq: Four times a day (QID) | CUTANEOUS | Status: DC
  Filled 2021-03-29: qty 100

## 2021-03-29 MED ORDER — TIZANIDINE HCL 2 MG PO TABS
2.0000 mg | ORAL_TABLET | Freq: Once | ORAL | Status: DC
  Filled 2021-03-29: qty 1

## 2021-03-29 MED ORDER — DICLOFENAC SODIUM 1 % EX GEL: 2.0000 g | Freq: Four times a day (QID) | CUTANEOUS | 1 refills | Status: DC

## 2021-03-29 MED ORDER — TIZANIDINE HCL 2 MG PO TABS: 2.0000 mg | ORAL_TABLET | Freq: Four times a day (QID) | ORAL | 0 refills | Status: DC | PRN

## 2021-03-29 NOTE — ED Triage Notes (Signed)
Had an MRI  last month. Has had increased rt neck pain including rt shoulder and across. Up into head. Pt has fused Cervical fusion 3-7.

## 2021-03-29 NOTE — ED Provider Notes (Signed)
[facility] EMERGENCY DEPT Provider Note   CSN: [id number] Arrival date & time: 03/29/21  0710     History Chief Complaint  Patient presents with   Neck Pain   Shoulder Pain    Lauren Orozco is a 65 y.o. female.  The history is provided by the patient and medical records.  Neck Pain Shoulder Pain Associated symptoms: neck pain   Lauren Orozco is a 65 y.o. female who presents to the Emergency Department complaining of neck pain. She presents the emergency department for evaluation of one month of progressive neck pain. She states that her symptoms began after having an MRI of her neck one month ago. She states that the board from the MRI resulted in pain to the posterior right neck. Her pain is constant and is described as a tightness that at times lessons. Her pain is worse when she lays down at night. The pain radiates across her shoulders. She has decreased range of motion in her neck. She has tried gabapentin and heat packs for her pain with no significant improvement. Sometimes the pain shoots down her arms. No chest pain, difficulty breathing, fevers, night sweats, nausea, vomiting. She has a history of hypertension, diabetes, prior cervical fusion in 2009 as well as a lipoma to the right super clavicular region. The MRI in May was obtained to further evaluate the lipoma.    Past Medical History:  Diagnosis Date   Anxiety    Chronic neck pain    with radiculopathy   Chronic pain of left wrist 2009   "since neck surgery"   Complication of anesthesia    Hard to wake up per pt; after neck sx had to be bagged.   DEPRESSION    DISC DISEASE, CERVICAL    Diverticulitis    HYPERLIPIDEMIA    Hypertension    Lymphocytosis 01/02/2016   NEPHROLITHIASIS, HX OF    NHL (non-Hodgkin's lymphoma) (HCC) 12/25/2014   Recurrent abdominal pain    Umbilical hernia    UTI'S, CHRONIC     Patient Active Problem List   Diagnosis Date Noted   Lipoma of chest  wall 12/06/2020   Chronic pain of left knee 01/30/2020   Primary osteoarthritis of both knees 01/30/2020   Nephrolithiasis 04/05/2019   Vitamin D deficiency disease 12/20/2018   Type II diabetes mellitus with manifestations ([place]) 10/13/2016   Essential hypertension 01/02/2016   Bilateral carpal tunnel syndrome 01/29/2015   Low grade lymphoma, stage I ([place]) 12/25/2014   Visit for screening mammogram 08/24/2014   Constipation 10/03/2013   Encounter for general adult medical examination with abnormal findings 10/20/2011   Tobacco abuse 10/19/2011   Hyperlipidemia with target LDL less than 130 12/22/2010   Generalized OA 12/22/2010   [place] DISEASE, CERVICAL 12/22/2010    Past Surgical History:  Procedure Laterality Date   ABDOMINAL HYSTERECTOMY  2001   [place]  2007   LEFT OOPHORECTOMY  2001   MYOMECTOMY  1981   NECK SURGERY  2009   fusion of C1, C2, C3     OB History   No obstetric history on file.     Family History  Problem Relation Age of Onset   Hypertension Mother    Diabetes Mother    Hyperlipidemia Mother    Heart attack Father        MI   Hyperlipidemia Father    Hypertension Father    Diabetes Father    Heart disease  Father    Cancer Neg Hx        Negative for Breast or Colon Cancer   Colon cancer Neg Hx     Social History   Tobacco Use   Smoking status: Former    Packs/day: 0.90    Years: 30.00    Pack years: 27.00    Types: Cigarettes    Quit date: 12/18/2019    Years since quitting: 1.2   Smokeless tobacco: Never  Vaping Use   Vaping Use: Never used  Substance Use Topics   Alcohol use: No   Drug use: No    Home Medications Prior to Admission medications   Medication Sig Start Date End Date Taking? Authorizing Provider  diclofenac Sodium (VOLTAREN) 1 % GEL Apply 2 g topically 4 (four) times daily. 03/29/21  Yes Quintella Reichert, MD  tiZANidine (ZANAFLEX) 2 MG tablet Take 1 tablet (2 mg total) by mouth every  6 (six) hours as needed for muscle spasms. 03/29/21  Yes Quintella Reichert, MD  Accu-Chek Softclix Lancets lancets Use to check blood sugar daily. DX: E11.9 04/26/20   Janith Lima, MD  acetaminophen (TYLENOL) 500 MG tablet Take 500 mg by mouth every 6 (six) hours as needed.    [provider]  Blood Glucose Monitoring Suppl (ACCU-CHEK GUIDE ME) w/Device KIT 1 Act by Does not apply route in the morning and at bedtime. 02/08/20   Janith Lima, MD  Cholecalciferol 50 MCG (2000 UT) TABS Take 2 tablets (4,000 Units total) by mouth daily. 12/24/20   Janith Lima, MD  Dapagliflozin-metFORMIN HCl ER (XIGDUO XR) 10-500 MG TB24 Take 1 tablet by mouth daily. 12/06/20   Janith Lima, MD  gabapentin (NEURONTIN) 300 MG capsule Take 1 capsule (300 mg total) by mouth 3 (three) times daily as needed (nerve pain). 06/19/20   Gregor Hams, MD  glucose blood (ACCU-CHEK GUIDE) test strip Use to check blood sugar daily. DX: E11.9 04/26/20   Janith Lima, MD  indapamide (LOZOL) 1.25 MG tablet TAKE 1 TABLET BY MOUTH EVERY DAY 02/13/21   Janith Lima, MD  irbesartan (AVAPRO) 150 MG tablet Take 1 tablet (150 mg total) by mouth daily. 12/06/20   Janith Lima, MD  predniSONE (DELTASONE) 20 MG tablet 2 tabs po daily x 4 days 03/01/21   Deno Etienne, DO  rosuvastatin (CRESTOR) 20 MG tablet Take 1 tablet (20 mg total) by mouth daily with breakfast. 12/06/20   Janith Lima, MD    Allergies    Ciprofloxacin, Ofloxacin, and Other  Review of Systems   Review of Systems  Musculoskeletal:  Positive for neck pain.  All other systems reviewed and are negative.  Physical Exam Updated Vital Signs BP (!) 174/93 (BP Location: Right Arm)   Pulse 81   Temp 98 F (36.7 C) (Oral)   Resp 18   Ht 5' 6"  (1.676 m)   Wt 70.8 kg   SpO2 100%   BMI 25.18 kg/m   Physical Exam Vitals and nursing note reviewed.  Constitutional:      Appearance: She is well-developed.  HENT:     Head: Normocephalic and atraumatic.   Neck:     Comments: There is a mass to the right supraclavicular region that is soft to palpation without significant tenderness. Just posterior medial to the mass are tense muscles with tenderness to palpation. This is over the trapezius region. There is no lymphadenopathy. There is no midline cervical spine tenderness. No overlying  rash. Cardiovascular:     Rate and Rhythm: Normal rate and regular rhythm.     Heart sounds: No murmur heard. Pulmonary:     Effort: Pulmonary effort is normal. No respiratory distress.     Breath sounds: Normal breath sounds.  Abdominal:     Palpations: Abdomen is soft.     Tenderness: There is no abdominal tenderness. There is no guarding or rebound.  Musculoskeletal:     Comments: 2+ radial pulses bilaterally  Skin:    General: Skin is warm and dry.  Neurological:     Mental Status: She is alert and oriented to person, place, and time.     Comments: Five out of five strength in all four extremities with sensation to light touch intact in all four extremities  Psychiatric:        Behavior: Behavior normal.    ED Results / Procedures / Treatments   Labs (all labs ordered are listed, but only abnormal results are displayed) Labs Reviewed - No data to display  EKG None  Radiology No results found.  Procedures Procedures   Medications Ordered in ED Medications  lidocaine (LIDODERM) 5 % 1 patch (1 patch Transdermal Patch Applied 03/29/21 0801)  cyclobenzaprine (FLEXERIL) tablet 5 mg (5 mg Oral Given 03/29/21 0800)    ED Course  I have reviewed the triage vital signs and the nursing notes.  Pertinent labs & imaging results that were available during my care of the patient were reviewed by me and considered in my medical decision making (see chart for details).    MDM Rules/Calculators/A&P                         patient here for evaluation of right posterior neck pain for the last month. She has tenderness to palpation over her trapezius  muscle. No evidence of acute infectious process. She is neurologically and vascularly intact. Discussed home care for muscle spasm as well as importance of outpatient follow-up.   Final Clinical Impression(s) / ED Diagnoses Final diagnoses:  Trapezius muscle spasm    Rx / DC Orders ED Discharge Orders          Ordered    tiZANidine (ZANAFLEX) 2 MG tablet  Every 6 hours PRN        03/29/21 0815    diclofenac Sodium (VOLTAREN) 1 % GEL  4 times daily        03/29/21 0815             Quintella Reichert, MD 03/29/21 [phone]

## 2021-03-31 NOTE — Telephone Encounter (Signed)
Pt has been informed and expressed understanding.  

## 2021-04-17 DIAGNOSIS — M542 Cervicalgia: Secondary | ICD-10-CM | POA: Diagnosis not present

## 2021-04-17 DIAGNOSIS — D171 Benign lipomatous neoplasm of skin and subcutaneous tissue of trunk: Secondary | ICD-10-CM | POA: Diagnosis not present

## 2021-04-21 ENCOUNTER — Other Ambulatory Visit: Payer: Self-pay

## 2021-04-21 ENCOUNTER — Ambulatory Visit: Payer: Medicare Other | Admitting: Internal Medicine

## 2021-04-23 ENCOUNTER — Ambulatory Visit (HOSPITAL_COMMUNITY)
Admission: RE | Admit: 2021-04-23 | Discharge: 2021-04-23 | Disposition: A | Discharge status: Home / Self Care | Payer: Medicare Other | Source: Ambulatory Visit | Attending: Hematology and Oncology | Admitting: Hematology and Oncology

## 2021-04-23 ENCOUNTER — Other Ambulatory Visit: Payer: Self-pay | Admitting: Hematology and Oncology

## 2021-04-23 ENCOUNTER — Other Ambulatory Visit: Payer: Self-pay

## 2021-04-23 ENCOUNTER — Inpatient Hospital Stay: Payer: Medicare Other | Attending: Hematology and Oncology

## 2021-04-23 DIAGNOSIS — Z720 Tobacco use: Secondary | ICD-10-CM

## 2021-04-23 DIAGNOSIS — Z87891 Personal history of nicotine dependence: Secondary | ICD-10-CM | POA: Diagnosis not present

## 2021-04-23 DIAGNOSIS — Z79899 Other long term (current) drug therapy: Secondary | ICD-10-CM | POA: Insufficient documentation

## 2021-04-23 DIAGNOSIS — C8590 Non-Hodgkin lymphoma, unspecified, unspecified site: Secondary | ICD-10-CM

## 2021-04-23 DIAGNOSIS — Z122 Encounter for screening for malignant neoplasm of respiratory organs: Secondary | ICD-10-CM | POA: Insufficient documentation

## 2021-04-23 DIAGNOSIS — R911 Solitary pulmonary nodule: Secondary | ICD-10-CM | POA: Diagnosis not present

## 2021-04-23 DIAGNOSIS — F17210 Nicotine dependence, cigarettes, uncomplicated: Secondary | ICD-10-CM | POA: Diagnosis not present

## 2021-04-23 DIAGNOSIS — Z8572 Personal history of non-Hodgkin lymphomas: Secondary | ICD-10-CM | POA: Diagnosis not present

## 2021-04-23 DIAGNOSIS — I700 Atherosclerosis of aorta: Secondary | ICD-10-CM | POA: Diagnosis not present

## 2021-04-23 DIAGNOSIS — J439 Emphysema, unspecified: Secondary | ICD-10-CM | POA: Diagnosis not present

## 2021-04-23 LAB — CBC WITH DIFFERENTIAL/PLATELET
Abs Immature Granulocytes: 0.02 10*3/uL (ref 0.00–0.07)
Basophils Absolute: 0 10*3/uL (ref 0.0–0.1)
Basophils Relative: 0 %
Eosinophils Absolute: 0.1 10*3/uL (ref 0.0–0.5)
Eosinophils Relative: 2 %
HCT: 39.5 % (ref 36.0–46.0)
Hemoglobin: 13.6 g/dL (ref 12.0–15.0)
Immature Granulocytes: 0 %
Lymphocytes Relative: 57 %
Lymphs Abs: 3.4 10*3/uL (ref 0.7–4.0)
MCH: 30.8 pg (ref 26.0–34.0)
MCHC: 34.4 g/dL (ref 30.0–36.0)
MCV: 89.4 fL (ref 80.0–100.0)
Monocytes Absolute: 0.6 10*3/uL (ref 0.1–1.0)
Monocytes Relative: 10 %
Neutro Abs: 1.9 10*3/uL (ref 1.7–7.7)
Neutrophils Relative %: 31 %
Platelets: 347 10*3/uL (ref 150–400)
RBC: 4.42 MIL/uL (ref 3.87–5.11)
RDW: 13.1 % (ref 11.5–15.5)
WBC: 6.1 10*3/uL (ref 4.0–10.5)
nRBC: 0 % (ref 0.0–0.2)

## 2021-04-23 LAB — CMP (CANCER CENTER ONLY)
ALT: 25 U/L (ref 0–44)
AST: 21 U/L (ref 15–41)
Albumin: 4.4 g/dL (ref 3.5–5.0)
Alkaline Phosphatase: 67 U/L (ref 38–126)
Anion gap: 11 (ref 5–15)
BUN: 12 mg/dL (ref 8–23)
CO2: 30 mmol/L (ref 22–32)
Calcium: 10.3 mg/dL (ref 8.9–10.3)
Chloride: 102 mmol/L (ref 98–111)
Creatinine: 0.72 mg/dL (ref 0.44–1.00)
GFR, Estimated: >60 mL/min (ref >60)
Glucose, Bld: 117 mg/dL — ABNORMAL HIGH (ref 70–99)
Potassium: 4.1 mmol/L (ref 3.5–5.1)
Sodium: 143 mmol/L (ref 135–145)
Total Bilirubin: 0.5 mg/dL (ref 0.3–1.2)
Total Protein: 7.4 g/dL (ref 6.5–8.1)

## 2021-04-24 ENCOUNTER — Inpatient Hospital Stay (HOSPITAL_BASED_OUTPATIENT_CLINIC_OR_DEPARTMENT_OTHER): Payer: Medicare Other | Admitting: Hematology and Oncology

## 2021-04-24 ENCOUNTER — Encounter: Payer: Self-pay | Admitting: Hematology and Oncology

## 2021-04-24 DIAGNOSIS — Z720 Tobacco use: Secondary | ICD-10-CM | POA: Diagnosis not present

## 2021-04-24 DIAGNOSIS — C8590 Non-Hodgkin lymphoma, unspecified, unspecified site: Secondary | ICD-10-CM

## 2021-04-24 DIAGNOSIS — Z8572 Personal history of non-Hodgkin lymphomas: Secondary | ICD-10-CM | POA: Diagnosis not present

## 2021-04-24 DIAGNOSIS — Z79899 Other long term (current) drug therapy: Secondary | ICD-10-CM | POA: Diagnosis not present

## 2021-04-24 DIAGNOSIS — F17210 Nicotine dependence, cigarettes, uncomplicated: Secondary | ICD-10-CM | POA: Diagnosis not present

## 2021-04-24 NOTE — Assessment & Plan Note (Signed)
The patient has smoked for over 30 years and is motivated to quit We reviewed CT imaging which showed no evidence of malignancy She appears to be interested to quit smoking; she is committed to smoking less than half a pack of cigarettes per day by next year or quit

## 2021-04-24 NOTE — Progress Notes (Signed)
[facility] OFFICE PROGRESS NOTE  Patient Care Team: Janith Lima, MD as PCP - General (Internal Medicine) Heath Lark, MD as Consulting Physician (Hematology and Oncology) Lyndal Pulley, DO as Attending Physician (Family Medicine) Alda Berthold, DO as Consulting Physician (Neurology) Charlton Haws, [facility] as Pharmacist (Pharmacist)  ASSESSMENT & PLAN:  Low grade lymphoma, stage I [facility]) I have reviewed CT imaging with her There are no signs of cancer on clinical exam Due to her smoking history, she would benefit from another annual CT screening based on recommendation by radiologist I will see her back next year for further follow-up  Tobacco abuse The patient has smoked for over 30 years and is motivated to quit We reviewed CT imaging which showed no evidence of malignancy She appears to be interested to quit smoking; she is committed to smoking less than half a pack of cigarettes per day by next year or quit  Orders Placed This Encounter  Procedures   CT CHEST LUNG CA SCREEN LOW DOSE W/O CM    Standing Status:   Future    Standing Expiration Date:   04/24/2022    Order Specific Question:   Reason for Exam (SYMPTOM  OR DIAGNOSIS REQUIRED)    Answer:   Hx lymphoma, chronic smoker   Comprehensive metabolic panel    Standing Status:   Standing    Number of Occurrences:   33    Standing Expiration Date:   04/24/2022   CBC with Differential/Platelet    Standing Status:   Standing    Number of Occurrences:   22    Standing Expiration Date:   04/24/2022    All questions were answered. The patient knows to call the clinic with any problems, questions or concerns. The total time spent in the appointment was 20 minutes encounter with patients including review of chart and various tests results, discussions about plan of care and coordination of care plan   Heath Lark, MD 04/24/2021 1:31 PM  INTERVAL HISTORY: Please see below for problem oriented  charting. She returns for follow-up on history of lymphoma and chronic smoking history She denies new lymphadenopathy No recent infection, fever or chills She is attempting to quit smoking  SUMMARY OF ONCOLOGIC HISTORY: Oncology History  Low grade lymphoma, stage I ([place])  12/18/2014 Pathology Results   Accession: [id number] Flow cytometry showed NHL    01/01/2015 Imaging   CT scan of the chest was performed due to chest pressure and it came back negative.      REVIEW OF SYSTEMS:   Constitutional: Denies fevers, chills or abnormal weight loss Eyes: Denies blurriness of vision Ears, nose, mouth, throat, and face: Denies mucositis or sore throat Respiratory: Denies cough, dyspnea or wheezes Cardiovascular: Denies palpitation, chest discomfort or lower extremity swelling Gastrointestinal:  Denies nausea, heartburn or change in bowel habits Skin: Denies abnormal skin rashes Lymphatics: Denies new lymphadenopathy or easy bruising Neurological:Denies numbness, tingling or new weaknesses Behavioral/Psych: Mood is stable, no new changes  All other systems were reviewed with the patient and are negative.  I have reviewed the past medical history, past surgical history, social history and family history with the patient and they are unchanged from previous note.  ALLERGIES:  is allergic to ciprofloxacin, ofloxacin, and other.  MEDICATIONS:  Current Outpatient Medications  Medication Sig Dispense Refill   Accu-Chek Softclix Lancets lancets Use to check blood sugar daily. DX: E11.9 100 each 3   acetaminophen (TYLENOL) 500 MG tablet Take  500 mg by mouth every 6 (six) hours as needed.     Blood Glucose Monitoring Suppl (ACCU-CHEK GUIDE ME) w/Device KIT 1 Act by Does not apply route in the morning and at bedtime. 1 kit 0   Cholecalciferol 50 MCG (2000 UT) TABS Take 2 tablets (4,000 Units total) by mouth daily. 180 tablet 1   Dapagliflozin-metFORMIN HCl ER (XIGDUO XR) 10-500 MG TB24 Take 1  tablet by mouth daily. 90 tablet 1   diclofenac Sodium (VOLTAREN) 1 % GEL Apply 2 g topically 4 (four) times daily. 100 g 1   gabapentin (NEURONTIN) 300 MG capsule Take 1 capsule (300 mg total) by mouth 3 (three) times daily as needed (nerve pain). 90 capsule 3   glucose blood (ACCU-CHEK GUIDE) test strip Use to check blood sugar daily. DX: E11.9 100 each 3   indapamide (LOZOL) 1.25 MG tablet TAKE 1 TABLET BY MOUTH EVERY DAY 90 tablet 1   irbesartan (AVAPRO) 150 MG tablet Take 1 tablet (150 mg total) by mouth daily. 90 tablet 0   predniSONE (DELTASONE) 20 MG tablet 2 tabs po daily x 4 days 8 tablet 0   rosuvastatin (CRESTOR) 20 MG tablet Take 1 tablet (20 mg total) by mouth daily with breakfast. 90 tablet 1   tiZANidine (ZANAFLEX) 2 MG tablet Take 1 tablet (2 mg total) by mouth every 6 (six) hours as needed for muscle spasms. 30 tablet 0   No current facility-administered medications for this visit.    PHYSICAL EXAMINATION: ECOG PERFORMANCE STATUS: 0 - Asymptomatic  Vitals:   04/24/21 1222  BP: (!) 149/82  Pulse: 77  Resp: 18  Temp: 98 F (36.7 C)  SpO2: 98%   Filed Weights   04/24/21 1222  Weight: 157 lb 3.2 oz (71.3 kg)    GENERAL:alert, no distress and comfortable SKIN: skin color, texture, turgor are normal, no rashes or significant lesions EYES: normal, Conjunctiva are pink and non-injected, sclera clear OROPHARYNX:no exudate, no erythema and lips, buccal mucosa, and tongue normal  NECK: supple, thyroid normal size, non-tender, without nodularity LYMPH:  no palpable lymphadenopathy in the cervical, axillary or inguinal LUNGS: clear to auscultation and percussion with normal breathing effort HEART: regular rate & rhythm and no murmurs and no lower extremity edema ABDOMEN:abdomen soft, non-tender and normal bowel sounds Musculoskeletal:no cyanosis of digits and no clubbing  NEURO: alert & oriented x 3 with fluent speech, no focal motor/sensory deficits  LABORATORY DATA:   I have reviewed the data as listed    Component Value Date/Time   NA 143 04/23/2021 1010   NA 140 01/02/2016 1034   K 4.1 04/23/2021 1010   K 4.0 01/02/2016 1034   CL 102 04/23/2021 1010   CO2 30 04/23/2021 1010   CO2 27 01/02/2016 1034   GLUCOSE 117 (H) 04/23/2021 1010   GLUCOSE 99 01/02/2016 1034   BUN 12 04/23/2021 1010   BUN 9.0 01/02/2016 1034   CREATININE 0.72 04/23/2021 1010   CREATININE 0.8 01/02/2016 1034   CALCIUM 10.3 04/23/2021 1010   CALCIUM 9.6 01/02/2016 1034   PROT 7.4 04/23/2021 1010   PROT 7.8 01/02/2016 1034   ALBUMIN 4.4 04/23/2021 1010   ALBUMIN 4.1 01/02/2016 1034   AST 21 04/23/2021 1010   AST 17 01/02/2016 1034   ALT 25 04/23/2021 1010   ALT 17 01/02/2016 1034   ALKPHOS 67 04/23/2021 1010   ALKPHOS 76 01/02/2016 1034   BILITOT 0.5 04/23/2021 1010   BILITOT 0.45 01/02/2016 1034   GFRNONAA >  60 04/23/2021 1010   GFRAA >60 04/22/2020 1308    No results found for: SPEP, UPEP  Lab Results  Component Value Date   WBC 6.1 04/23/2021   NEUTROABS 1.9 04/23/2021   HGB 13.6 04/23/2021   HCT 39.5 04/23/2021   MCV 89.4 04/23/2021   PLT 347 04/23/2021      Chemistry      Component Value Date/Time   NA 143 04/23/2021 1010   NA 140 01/02/2016 1034   K 4.1 04/23/2021 1010   K 4.0 01/02/2016 1034   CL 102 04/23/2021 1010   CO2 30 04/23/2021 1010   CO2 27 01/02/2016 1034   BUN 12 04/23/2021 1010   BUN 9.0 01/02/2016 1034   CREATININE 0.72 04/23/2021 1010   CREATININE 0.8 01/02/2016 1034      Component Value Date/Time   CALCIUM 10.3 04/23/2021 1010   CALCIUM 9.6 01/02/2016 1034   ALKPHOS 67 04/23/2021 1010   ALKPHOS 76 01/02/2016 1034   AST 21 04/23/2021 1010   AST 17 01/02/2016 1034   ALT 25 04/23/2021 1010   ALT 17 01/02/2016 1034   BILITOT 0.5 04/23/2021 1010   BILITOT 0.45 01/02/2016 1034       RADIOGRAPHIC STUDIES: I have reviewed imaging study with the patient I have personally reviewed the radiological images as listed and  agreed with the findings in the report. CT CHEST LCS NODULE F/U W/O CONTRAST  Result Date: 04/23/2021 CLINICAL DATA:  65 year old female with 45 pack-year history of smoking. Lung cancer screening. EXAM: CT CHEST WITHOUT CONTRAST FOR LUNG CANCER SCREENING NODULE FOLLOW-UP TECHNIQUE: Multidetector CT imaging of the chest was performed following the standard protocol without IV contrast. COMPARISON:  04/22/2020 FINDINGS: Cardiovascular: The heart size is normal. No substantial pericardial effusion. Coronary artery calcification is evident. Mild atherosclerotic calcification is noted in the wall of the thoracic aorta. Mediastinum/Nodes: No mediastinal lymphadenopathy. No evidence for gross hilar lymphadenopathy although assessment is limited by the lack of intravenous contrast on today's study. The esophagus has normal imaging features. There is no axillary lymphadenopathy. Lungs/Pleura: Centrilobular emphsyema noted. Previously identified tiny pulmonary nodule is stable in the interval. No new suspicious pulmonary nodule or mass. No focal airspace consolidation. No pleural effusion. Upper Abdomen: Stable right adrenal adenoma. Musculoskeletal: No worrisome lytic or sclerotic osseous abnormality. IMPRESSION: 1. Lung-RADS 2, benign appearance or behavior. Continue annual screening with low-dose chest CT without contrast in 12 months. 2. Stable right adrenal adenoma. 3. Aortic Atherosclerosis (ICD10-I70.0) and Emphysema (ICD10-J43.9). Electronically Signed   By: Misty Stanley M.D.   On: 04/23/2021 12:52

## 2021-04-24 NOTE — Assessment & Plan Note (Signed)
I have reviewed CT imaging with her There are no signs of cancer on clinical exam Due to her smoking history, she would benefit from another annual CT screening based on recommendation by radiologist I will see her back next year for further follow-up

## 2021-05-07 DIAGNOSIS — M47812 Spondylosis without myelopathy or radiculopathy, cervical region: Secondary | ICD-10-CM | POA: Diagnosis not present

## 2021-05-07 DIAGNOSIS — R030 Elevated blood-pressure reading, without diagnosis of hypertension: Secondary | ICD-10-CM | POA: Diagnosis not present

## 2021-05-07 DIAGNOSIS — G959 Disease of spinal cord, unspecified: Secondary | ICD-10-CM | POA: Diagnosis not present

## 2021-05-09 DIAGNOSIS — Z20822 Contact with and (suspected) exposure to covid-19: Secondary | ICD-10-CM | POA: Diagnosis not present

## 2021-05-13 DIAGNOSIS — M542 Cervicalgia: Secondary | ICD-10-CM | POA: Diagnosis not present

## 2021-05-13 DIAGNOSIS — G959 Disease of spinal cord, unspecified: Secondary | ICD-10-CM | POA: Diagnosis not present

## 2021-05-21 ENCOUNTER — Telehealth: Payer: Self-pay | Admitting: Pharmacist

## 2021-05-21 DIAGNOSIS — M47812 Spondylosis without myelopathy or radiculopathy, cervical region: Secondary | ICD-10-CM | POA: Diagnosis not present

## 2021-05-21 NOTE — Progress Notes (Signed)
Chronic Care Management Pharmacy Assistant   Name: Lauren Orozco  MRN: [id number] DOB: October 02, 1956   Reason for Encounter: Disease State   Conditions to be addressed/monitored: DMII  Recent office visits:  12/05/20 Dr. Scarlette Calico (PCP) Annual Exam  Diabetes  Hypertension  Hyperlipidemia I have discontinued [name]. Ciano's meloxicam and predniSONE. I have also changed her rosuvastatin and irbesartan.  Recent consult visits:  04/24/21 Dr. Shea Stakes, Oncology (Low grade lymphoma, stage I) . Hospital visits:  Medication Reconciliation was completed by comparing discharge summary, patient's EMR and Pharmacy list, and upon discussion with patient.  Admitted to the hospital on 03/29/21 due to Trapezius muscle spasm. Discharge date was 03/29/21. Discharged from [facility] ED   New?Medications Started at [facility] Discharge:?? -started 03/29/21 lidocaine and flexeril   03/01/21 Arm Pain-[facility]  Medications that remain the same after Hospital Discharge:??  -All other medications will remain the same.    Medications: Outpatient Encounter Medications as of 05/21/2021  Medication Sig   Accu-Chek Softclix Lancets lancets Use to check blood sugar daily. DX: E11.9   acetaminophen (TYLENOL) 500 MG tablet Take 500 mg by mouth every 6 (six) hours as needed.   Blood Glucose Monitoring Suppl (ACCU-CHEK GUIDE ME) w/Device KIT 1 Act by Does not apply route in the morning and at bedtime.   Cholecalciferol 50 MCG (2000 UT) TABS Take 2 tablets (4,000 Units total) by mouth daily.   Dapagliflozin-metFORMIN HCl ER (XIGDUO XR) 10-500 MG TB24 Take 1 tablet by mouth daily.   diclofenac Sodium (VOLTAREN) 1 % GEL Apply 2 g topically 4 (four) times daily.   gabapentin (NEURONTIN) 300 MG capsule Take 1 capsule (300 mg total) by mouth 3 (three) times daily as needed (nerve pain).   glucose blood (ACCU-CHEK GUIDE) test strip Use to check blood sugar  daily. DX: E11.9   indapamide (LOZOL) 1.25 MG tablet TAKE 1 TABLET BY MOUTH EVERY DAY   irbesartan (AVAPRO) 150 MG tablet Take 1 tablet (150 mg total) by mouth daily.   rosuvastatin (CRESTOR) 20 MG tablet Take 1 tablet (20 mg total) by mouth daily with breakfast.   tiZANidine (ZANAFLEX) 2 MG tablet Take 1 tablet (2 mg total) by mouth every 6 (six) hours as needed for muscle spasms.   No facility-administered encounter medications on file as of 05/21/2021.     Recent Relevant Labs: Lab Results  Component Value Date/Time   HGBA1C 6.7 (H) 12/05/2020 02:13 PM   HGBA1C 6.9 (H) 07/11/2020 10:05 AM   MICROALBUR <0.7 12/05/2020 02:13 PM   MICROALBUR 1.4 01/30/2020 04:34 PM    Kidney Function Lab Results  Component Value Date/Time   CREATININE 0.72 04/23/2021 10:10 AM   CREATININE 0.78 12/05/2020 02:13 PM   CREATININE 0.82 07/11/2020 10:06 AM   CREATININE 0.8 01/02/2016 10:34 AM   CREATININE 0.8 12/18/2014 11:12 AM   GFR 80.27 12/05/2020 02:13 PM   GFRNONAA >60 04/23/2021 10:10 AM   GFRAA >60 04/22/2020 01:08 PM     Contacted patient on 05/21/21 to discuss diabetes disease state.   Current antihyperglycemic regimen:  Dapagliflozin-metFORMIN HCl ER (XIGDUO XR) 10-500 MG    Patient verbally confirms she is taking the above medications as directed. Yes  What diet changes have been made to improve diabetes control?Patient states that she is eating more vegetables and fruits  What recent interventions/DTPs have been made to improve glycemic control:  None ID  Have there been any recent hospitalizations or ED visits since last  visit with CPP? Yes  Patient denies hypoglycemic symptoms, including None  Patient denies hyperglycemic symptoms, including none  How often are you checking your blood sugar? once daily  What are your blood sugars ranging? 104 Fasting: NA Before meals: NA After meals: NA Bedtime: NA  During the week, how often does your blood glucose drop below 70?  Never  Are you checking your feet daily/regularly? Yes  Adherence Review: Is the patient currently on a STATIN medication? Yes Is the patient currently on ACE/ARB medication? Yes Does the patient have >5 day gap between last estimated fill dates? Yes Care Gaps: Last annual wellness visit:12/04/20 Last eye exam / retinopathy screening:03/13/21 Last diabetic foot exam:01/31/20  Star Rating Drugs:  Medication:  Last Fill: Day Supply Irbesartan 150 mcg 12/06/20  90 Rosuvastatin 20 mg 05/17/21 90 Dapagliflozin-metformin HCI ER 10-500 mg 03/26/21 90 ds  No appointments scheduled within the next 30 days.   [place] Pharmacist Assistant [phone]   Time spent:40

## 2021-06-05 ENCOUNTER — Telehealth: Payer: Self-pay | Admitting: Pharmacist

## 2021-06-05 NOTE — Progress Notes (Signed)
Chronic Care Management Pharmacy Assistant   Name: Lauren Orozco  MRN: [id number] DOB: 1956/04/23   Reason for Encounter: Disease State   Conditions to be addressed/monitored: HTN   Recent office visits:  None ID  Recent consult visits:  None ID  Hospital visits:  None in previous 6 months  Medications: Outpatient Encounter Medications as of 06/05/2021  Medication Sig   Accu-Chek Softclix Lancets lancets Use to check blood sugar daily. DX: E11.9   acetaminophen (TYLENOL) 500 MG tablet Take 500 mg by mouth every 6 (six) hours as needed.   Blood Glucose Monitoring Suppl (ACCU-CHEK GUIDE ME) w/Device KIT 1 Act by Does not apply route in the morning and at bedtime.   Cholecalciferol 50 MCG (2000 UT) TABS Take 2 tablets (4,000 Units total) by mouth daily.   Dapagliflozin-metFORMIN HCl ER (XIGDUO XR) 10-500 MG TB24 Take 1 tablet by mouth daily.   diclofenac Sodium (VOLTAREN) 1 % GEL Apply 2 g topically 4 (four) times daily.   gabapentin (NEURONTIN) 300 MG capsule Take 1 capsule (300 mg total) by mouth 3 (three) times daily as needed (nerve pain).   glucose blood (ACCU-CHEK GUIDE) test strip Use to check blood sugar daily. DX: E11.9   indapamide (LOZOL) 1.25 MG tablet TAKE 1 TABLET BY MOUTH EVERY DAY   irbesartan (AVAPRO) 150 MG tablet Take 1 tablet (150 mg total) by mouth daily.   rosuvastatin (CRESTOR) 20 MG tablet Take 1 tablet (20 mg total) by mouth daily with breakfast.   tiZANidine (ZANAFLEX) 2 MG tablet Take 1 tablet (2 mg total) by mouth every 6 (six) hours as needed for muscle spasms.   No facility-administered encounter medications on file as of 06/05/2021.    Recent Office Vitals: BP Readings from Last 3 Encounters:  04/24/21 (!) 149/82  03/29/21 (!) 153/96  03/01/21 (!) 150/104   Pulse Readings from Last 3 Encounters:  04/24/21 77  03/29/21 77  03/01/21 77    Wt Readings from Last 3 Encounters:  04/24/21 157 lb 3.2 oz (71.3 kg)  03/29/21 156 lb  (70.8 kg)  03/01/21 156 lb (70.8 kg)     Kidney Function Lab Results  Component Value Date/Time   CREATININE 0.72 04/23/2021 10:10 AM   CREATININE 0.78 12/05/2020 02:13 PM   CREATININE 0.82 07/11/2020 10:06 AM   CREATININE 0.8 01/02/2016 10:34 AM   CREATININE 0.8 12/18/2014 11:12 AM   GFR 80.27 12/05/2020 02:13 PM   GFRNONAA >60 04/23/2021 10:10 AM   GFRAA >60 04/22/2020 01:08 PM    BMP Latest Ref Rng & Units 04/23/2021 12/05/2020 07/11/2020  Glucose 70 - 99 mg/dL 117(H) 93 111(H)  BUN 8 - 23 mg/dL 12 11 16   Creatinine 0.44 - 1.00 mg/dL 0.72 0.78 0.82  Sodium 135 - 145 mmol/L 143 138 141  Potassium 3.5 - 5.1 mmol/L 4.1 3.7 3.6  Chloride 98 - 111 mmol/L 102 102 103  CO2 22 - 32 mmol/L 30 30 32  Calcium 8.9 - 10.3 mg/dL 10.3 9.9 9.9     Contacted patient on 06/05/21 to discuss hypertension disease state  Current antihypertensive regimen:  Irbesartan 150 mg  Patient verbally confirms she is taking the above medications as directed. No. Patient did not give a clear answer if she was taking the medication or not. She stated that she gets medication from [facility]. I told the patient that I see that in the chart she last has it filled 12/06/20, but I would like to know when was the  last time she had it filled. She had a complaint that she has not spoken to Dr. Ronnald Ramp or Mendel Ryder in a while. She states that she is directed to everyone else except to who she needs. She stated that she is very disappointed and is thinking about changing doctors I asked the patient would she like an appt with Mendel Ryder she stated yes. How often are you checking your Blood Pressure? 1-2x per week  she checks her blood pressure  when she can   taking her medication.  Current home BP readings: Patient stated blood pressure was running around 150 but now it is down to around 130-139   Any readings above 180/120? No   What recent interventions/DTPs have been made by any provider to improve Blood Pressure control  since last CPP Visit: None noted  Any recent hospitalizations or ED visits since last visit with CPP? No  What diet changes have been made to improve Blood Pressure Control?  Patient has made not changes  What exercise is being done to improve your Blood Pressure Control?  Patient did mention that she is walking  Adherence Review: Is the patient currently on ACE/ARB medication? Yes Does the patient have >5 day gap between last estimated fill dates? Yes   Star Rating Drugs:  Medication:  Last Fill: Day Supply Irbesartan 150 mcg     12/06/20              90 Rosuvastatin 20 mg    05/17/21            90 Dapagliflozin-metformin HCI ER 10-500 mg 03/26/21 90 ds   Care Gaps: Last annual wellness visit::12/04/20   No appointments scheduled within the next 30 days.   [place] Pharmacist Assistant [phone]   Time spent:40

## 2021-07-31 ENCOUNTER — Ambulatory Visit: Payer: Medicare Other | Admitting: Internal Medicine

## 2021-08-18 ENCOUNTER — Encounter: Payer: Self-pay | Admitting: Internal Medicine

## 2021-08-18 ENCOUNTER — Ambulatory Visit (INDEPENDENT_AMBULATORY_CARE_PROVIDER_SITE_OTHER): Payer: Medicare Other | Admitting: Internal Medicine

## 2021-08-18 ENCOUNTER — Other Ambulatory Visit: Payer: Self-pay

## 2021-08-18 VITALS — BP 128/76 | HR 85 | Temp 98.6°F | Ht 66.0 in | Wt 154.0 lb

## 2021-08-18 DIAGNOSIS — M5030 Other cervical disc degeneration, unspecified cervical region: Secondary | ICD-10-CM | POA: Diagnosis not present

## 2021-08-18 DIAGNOSIS — E118 Type 2 diabetes mellitus with unspecified complications: Secondary | ICD-10-CM

## 2021-08-18 DIAGNOSIS — M170 Bilateral primary osteoarthritis of knee: Secondary | ICD-10-CM | POA: Diagnosis not present

## 2021-08-18 DIAGNOSIS — I10 Essential (primary) hypertension: Secondary | ICD-10-CM | POA: Diagnosis not present

## 2021-08-18 DIAGNOSIS — E785 Hyperlipidemia, unspecified: Secondary | ICD-10-CM | POA: Diagnosis not present

## 2021-08-18 DIAGNOSIS — Z23 Encounter for immunization: Secondary | ICD-10-CM | POA: Diagnosis not present

## 2021-08-18 DIAGNOSIS — F331 Major depressive disorder, recurrent, moderate: Secondary | ICD-10-CM | POA: Insufficient documentation

## 2021-08-18 DIAGNOSIS — M159 Polyosteoarthritis, unspecified: Secondary | ICD-10-CM

## 2021-08-18 LAB — BASIC METABOLIC PANEL
BUN: 14 mg/dL (ref 6–23)
CO2: 30 mEq/L (ref 19–32)
Calcium: 9.7 mg/dL (ref 8.4–10.5)
Chloride: 104 mEq/L (ref 96–112)
Creatinine, Ser: 0.84 mg/dL (ref 0.40–1.20)
GFR: 73.08 mL/min (ref >60.00)
Glucose, Bld: 105 mg/dL — ABNORMAL HIGH (ref 70–99)
Potassium: 3.7 mEq/L (ref 3.5–5.1)
Sodium: 140 mEq/L (ref 135–145)

## 2021-08-18 LAB — HEMOGLOBIN A1C: Hgb A1c MFr Bld: 6.9 % — ABNORMAL HIGH (ref 4.6–6.5)

## 2021-08-18 MED ORDER — TRAMADOL HCL 50 MG PO TABS: 50.0000 mg | ORAL_TABLET | Freq: Four times a day (QID) | ORAL | 3 refills | Status: AC | PRN

## 2021-08-18 MED ORDER — DULOXETINE HCL 30 MG PO CPEP: 30.0000 mg | ORAL_CAPSULE | Freq: Every day | ORAL | 0 refills | Status: DC

## 2021-08-18 MED ORDER — XIGDUO XR 10-500 MG PO TB24: 1.0000 | ORAL_TABLET | Freq: Every day | ORAL | 1 refills | Status: DC

## 2021-08-18 MED ORDER — ROSUVASTATIN CALCIUM 20 MG PO TABS
20.0000 mg | ORAL_TABLET | Freq: Every day | ORAL | 1 refills | Status: DC
Start: 2021-08-18 — End: 2021-09-08

## 2021-08-18 NOTE — Progress Notes (Signed)
Subjective:  Patient ID: Lauren Orozco, female    DOB: 1956-08-13  Age: 65 y.o. MRN: [id number]  CC: Hypertension, Diabetes, Depression, and Back Pain  This visit occurred during the SARS-CoV-2 public health emergency.  Safety protocols were in place, including screening questions prior to the visit, additional usage of staff PPE, and extensive cleaning of exam room while observing appropriate contact time as indicated for disinfecting solutions.    HPI Lauren Orozco presents for f/up -   She complains of anxiety, depression, sadness. She dose not feel helpless/hopeless. She has been seeing NS for neck pain and LBP and tells me that MRI's have been done. She says the pain keeps her awake at night.  Outpatient Medications Prior to Visit  Medication Sig Dispense Refill   Accu-Chek Softclix Lancets lancets Use to check blood sugar daily. DX: E11.9 100 each 3   acetaminophen (TYLENOL) 500 MG tablet Take 500 mg by mouth every 6 (six) hours as needed.     Blood Glucose Monitoring Suppl (ACCU-CHEK GUIDE ME) w/Device KIT 1 Act by Does not apply route in the morning and at bedtime. 1 kit 0   Cholecalciferol 50 MCG (2000 UT) TABS Take 2 tablets (4,000 Units total) by mouth daily. 180 tablet 1   diclofenac Sodium (VOLTAREN) 1 % GEL Apply 2 g topically 4 (four) times daily. 100 g 1   gabapentin (NEURONTIN) 300 MG capsule Take 1 capsule (300 mg total) by mouth 3 (three) times daily as needed (nerve pain). 90 capsule 3   glucose blood (ACCU-CHEK GUIDE) test strip Use to check blood sugar daily. DX: E11.9 100 each 3   indapamide (LOZOL) 1.25 MG tablet TAKE 1 TABLET BY MOUTH EVERY DAY 90 tablet 1   irbesartan (AVAPRO) 150 MG tablet Take 1 tablet (150 mg total) by mouth daily. 90 tablet 0   tiZANidine (ZANAFLEX) 2 MG tablet Take 1 tablet (2 mg total) by mouth every 6 (six) hours as needed for muscle spasms. 30 tablet 0   Dapagliflozin-metFORMIN HCl ER (XIGDUO XR) 10-500 MG TB24 Take 1  tablet by mouth daily. 90 tablet 1   rosuvastatin (CRESTOR) 20 MG tablet Take 1 tablet (20 mg total) by mouth daily with breakfast. 90 tablet 1   No facility-administered medications prior to visit.    ROS Review of Systems  Constitutional:  Negative for chills, diaphoresis, fatigue and fever.  HENT: Negative.    Respiratory:  Negative for cough, chest tightness, shortness of breath and wheezing.   Cardiovascular:  Negative for chest pain, palpitations and leg swelling.  Gastrointestinal:  Negative for abdominal pain, constipation, diarrhea, nausea and vomiting.  Genitourinary:  Negative for difficulty urinating and dysuria.  Musculoskeletal:  Positive for arthralgias, back pain and neck pain. Negative for myalgias.  Skin: Negative.   Neurological:  Negative for dizziness, weakness and headaches.  Hematological:  Negative for adenopathy. Does not bruise/bleed easily.  Psychiatric/Behavioral:  Positive for decreased concentration and dysphoric mood. Negative for behavioral problems, confusion, self-injury and suicidal ideas. The patient is nervous/anxious. The patient is not hyperactive.    Objective:  BP 128/76 (BP Location: Left Arm, Patient Position: Sitting, Cuff Size: Large)   Pulse 85   Temp 98.6 F (37 C) (Oral)   Ht 5' 6"  (1.676 m)   Wt 154 lb (69.9 kg)   SpO2 95%   BMI 24.86 kg/m   BP Readings from Last 3 Encounters:  08/18/21 128/76  04/24/21 (!) 149/82  03/29/21 (!) 153/96  Wt Readings from Last 3 Encounters:  08/18/21 154 lb (69.9 kg)  04/24/21 157 lb 3.2 oz (71.3 kg)  03/29/21 156 lb (70.8 kg)    Physical Exam Vitals reviewed.  HENT:     Nose: Nose normal.     Mouth/Throat:     Mouth: Mucous membranes are moist.  Eyes:     General: No scleral icterus.    Conjunctiva/sclera: Conjunctivae normal.  Cardiovascular:     Rate and Rhythm: Normal rate and regular rhythm.     Heart sounds: No murmur heard. Pulmonary:     Effort: No respiratory distress.      Breath sounds: No stridor. No wheezing or rhonchi.  Abdominal:     General: Abdomen is flat.     Palpations: There is no mass.     Tenderness: There is no abdominal tenderness. There is no guarding.     Hernia: No hernia is present.  Musculoskeletal:        General: Normal range of motion.     Cervical back: Neck supple.     Right lower leg: No edema.     Left lower leg: No edema.  Skin:    General: Skin is warm and dry.  Neurological:     General: No focal deficit present.     Mental Status: She is alert.  Psychiatric:        Attention and Perception: Attention normal. She is attentive.        Mood and Affect: Mood is anxious and depressed.        Speech: She is communicative. Speech is not delayed or tangential.        Behavior: Behavior normal. Behavior is cooperative.        Thought Content: Thought content normal. Thought content is not paranoid. Thought content does not include homicidal or suicidal ideation.        Cognition and Memory: Cognition and memory normal.    Lab Results  Component Value Date   WBC 6.1 04/23/2021   HGB 13.6 04/23/2021   HCT 39.5 04/23/2021   PLT 347 04/23/2021   GLUCOSE 105 (H) 08/18/2021   CHOL 122 12/05/2020   TRIG 53.0 12/05/2020   HDL 53.60 12/05/2020   LDLDIRECT 150.8 10/19/2011   LDLCALC 58 12/05/2020   ALT 25 04/23/2021   AST 21 04/23/2021   NA 140 08/18/2021   K 3.7 08/18/2021   CL 104 08/18/2021   CREATININE 0.84 08/18/2021   BUN 14 08/18/2021   CO2 30 08/18/2021   TSH 0.61 12/05/2020   HGBA1C 6.9 (H) 08/18/2021   MICROALBUR <0.7 12/05/2020    CT CHEST LCS NODULE F/U W/O CONTRAST  Result Date: 04/23/2021 CLINICAL DATA:  64 year old female with 45 pack-year history of smoking. Lung cancer screening. EXAM: CT CHEST WITHOUT CONTRAST FOR LUNG CANCER SCREENING NODULE FOLLOW-UP TECHNIQUE: Multidetector CT imaging of the chest was performed following the standard protocol without IV contrast. COMPARISON:  04/22/2020 FINDINGS:  Cardiovascular: The heart size is normal. No substantial pericardial effusion. Coronary artery calcification is evident. Mild atherosclerotic calcification is noted in the wall of the thoracic aorta. Mediastinum/Nodes: No mediastinal lymphadenopathy. No evidence for gross hilar lymphadenopathy although assessment is limited by the lack of intravenous contrast on today's study. The esophagus has normal imaging features. There is no axillary lymphadenopathy. Lungs/Pleura: Centrilobular emphsyema noted. Previously identified tiny pulmonary nodule is stable in the interval. No new suspicious pulmonary nodule or mass. No focal airspace consolidation. No pleural effusion. Upper Abdomen: Stable  right adrenal adenoma. Musculoskeletal: No worrisome lytic or sclerotic osseous abnormality. IMPRESSION: 1. Lung-RADS 2, benign appearance or behavior. Continue annual screening with low-dose chest CT without contrast in 12 months. 2. Stable right adrenal adenoma. 3. Aortic Atherosclerosis (ICD10-I70.0) and Emphysema (ICD10-J43.9). Electronically Signed   By: Misty Stanley M.D.   On: 04/23/2021 12:52    Assessment & Plan:   Charnee was seen today for hypertension, diabetes, depression and back pain.  Diagnoses and all orders for this visit:  Generalized OA -     traMADol (ULTRAM) 50 MG tablet; Take 1 tablet (50 mg total) by mouth every 6 (six) hours as needed for up to 5 days.  DISC DISEASE, CERVICAL -     traMADol (ULTRAM) 50 MG tablet; Take 1 tablet (50 mg total) by mouth every 6 (six) hours as needed for up to 5 days.  Primary osteoarthritis of both knees -     DULoxetine (CYMBALTA) 30 MG capsule; Take 1 capsule (30 mg total) by mouth daily. -     traMADol (ULTRAM) 50 MG tablet; Take 1 tablet (50 mg total) by mouth every 6 (six) hours as needed for up to 5 days.  Type II diabetes mellitus with manifestations ([place])- Her blood sugar is well controlled. -     Basic metabolic panel; Future -     Hemoglobin A1c;  Future -     HM Diabetes Foot Exam -     Hemoglobin A1c -     Basic metabolic panel -     Dapagliflozin-metFORMIN HCl ER (XIGDUO XR) 10-500 MG TB24; Take 1 tablet by mouth daily.  Essential hypertension- Her BP is well controlled. -     Hemoglobin A1c; Future -     Hemoglobin A1c  Moderate episode of recurrent major depressive disorder (HCC) -     DULoxetine (CYMBALTA) 30 MG capsule; Take 1 capsule (30 mg total) by mouth daily.  Flu vaccine need -     Flu Vaccine QUAD High Dose(Fluad)  Hyperlipidemia with target LDL less than 130- LDL goal achieved. Doing well on the statin  -     rosuvastatin (CRESTOR) 20 MG tablet; Take 1 tablet (20 mg total) by mouth daily with breakfast.  I am having [name]. [place] start on DULoxetine and traMADol. I am also having her maintain her acetaminophen, Accu-Chek Guide Me, Accu-Chek Softclix Lancets, Accu-Chek Guide, gabapentin, irbesartan, Cholecalciferol, indapamide, tiZANidine, diclofenac Sodium, Xigduo XR, and rosuvastatin.  Meds ordered this encounter  Medications   DULoxetine (CYMBALTA) 30 MG capsule    Sig: Take 1 capsule (30 mg total) by mouth daily.    Dispense:  30 capsule    Refill:  0   traMADol (ULTRAM) 50 MG tablet    Sig: Take 1 tablet (50 mg total) by mouth every 6 (six) hours as needed for up to 5 days.    Dispense:  75 tablet    Refill:  3   Dapagliflozin-metFORMIN HCl ER (XIGDUO XR) 10-500 MG TB24    Sig: Take 1 tablet by mouth daily.    Dispense:  90 tablet    Refill:  1   rosuvastatin (CRESTOR) 20 MG tablet    Sig: Take 1 tablet (20 mg total) by mouth daily with breakfast.    Dispense:  90 tablet    Refill:  1    This prescription was filled on 04/29/2020. Any refills authorized will be placed on file.      Follow-up: Return in about 4 months (around  12/16/2021).  Scarlette Calico, MD

## 2021-08-18 NOTE — Patient Instructions (Signed)
Type 2 Diabetes Mellitus, Diagnosis, Adult ?Type 2 diabetes (type 2 diabetes mellitus) is a long-term, or chronic, disease. In type 2 diabetes, one or both of these problems may be present: ?The pancreas does not make enough of a hormone called insulin. ?Cells in the body do not respond properly to the insulin that the body makes (insulin resistance). ?Normally, insulin allows blood sugar (glucose) to enter cells in the body. The cells use glucose for energy. Insulin resistance or lack of insulin causes excess glucose to build up in the blood instead of going into cells. This causes high blood glucose (hyperglycemia).  ?What are the causes? ?The exact cause of type 2 diabetes is not known. ?What increases the risk? ?The following factors may make you more likely to develop this condition: ?Having a family member with type 2 diabetes. ?Being overweight or obese. ?Being inactive (sedentary). ?Having been diagnosed with insulin resistance. ?Having a history of prediabetes, diabetes when you were pregnant (gestational diabetes), or polycystic ovary syndrome (PCOS). ?What are the signs or symptoms? ?In the early stage of this condition, you may not have symptoms. Symptoms develop slowly and may include: ?Increased thirst or hunger. ?Increased urination. ?Unexplained weight loss. ?Tiredness (fatigue) or weakness. ?Vision changes, such as blurry vision. ?Dark patches on the skin. ?How is this diagnosed? ?This condition is diagnosed based on your symptoms, your medical history, a physical exam, and your blood glucose level. Your blood glucose may be checked with one or more of the following blood tests: ?A fasting blood glucose (FBG) test. You will not be allowed to eat (you will fast) for 8 hours or longer before a blood sample is taken. ?A random blood glucose test. This test checks blood glucose at any time of day regardless of when you ate. ?An A1C (hemoglobin A1C) blood test. This test provides information about blood  glucose levels over the previous 2-3 months. ?An oral glucose tolerance test (OGTT). This test measures your blood glucose at two times: ?After fasting. This is your baseline blood glucose level. ?Two hours after drinking a beverage that contains glucose. ?You may be diagnosed with type 2 diabetes if: ?Your fasting blood glucose level is 126 mg/dL (7.0 mmol/L) or higher. ?Your random blood glucose level is 200 mg/dL (11.1 mmol/L) or higher. ?Your A1C level is 6.5% or higher. ?Your oral glucose tolerance test result is higher than 200 mg/dL (11.1 mmol/L). ?These blood tests may be repeated to confirm your diagnosis. ?How is this treated? ?Your treatment may be managed by a specialist called an endocrinologist. Type 2 diabetes may be treated by following instructions from your health care provider about: ?Making dietary and lifestyle changes. These may include: ?Following a personalized nutrition plan that is developed by a registered dietitian. ?Exercising regularly. ?Finding ways to manage stress. ?Checking your blood glucose level as often as told. ?Taking diabetes medicines or insulin daily. This helps to keep your blood glucose levels in the healthy range. ?Taking medicines to help prevent complications from diabetes. Medicines may include: ?Aspirin. ?Medicine to lower cholesterol. ?Medicine to control blood pressure. ?Your health care provider will set treatment goals for you. Your goals will be based on your age, other medical conditions you have, and how you respond to diabetes treatment. Generally, the goal of treatment is to maintain the following blood glucose levels: ?Before meals: 80-130 mg/dL (4.4-7.2 mmol/L). ?After meals: below 180 mg/dL (10 mmol/L). ?A1C level: less than 7%. ?Follow these instructions at home: ?Questions to ask your health care provider ?  Consider asking the following questions: ?Should I meet with a certified diabetes care and education specialist? ?What diabetes medicines do I need,  and when should I take them? ?What equipment will I need to manage my diabetes at home? ?How often do I need to check my blood glucose? ?Where can I find a support group for people with diabetes? ?What number can I call if I have questions? ?When is my next appointment? ?General instructions ?Take over-the-counter and prescription medicines only as told by your health care provider. ?Keep all follow-up visits. This is important. ?Where to find more information ?For help and guidance and for more information about diabetes, please visit: ?American Diabetes Association (ADA): www.diabetes.org ?American Association of Diabetes Care and Education Specialists (ADCES): www.diabeteseducator.org ?International Diabetes Federation (IDF): www.idf.org ?Contact a health care provider if: ?Your blood glucose is at or above 240 mg/dL (13.3 mmol/L) for 2 days in a row. ?You have been sick or have had a fever for 2 days or longer, and you are not getting better. ?You have any of the following problems for more than 6 hours: ?You cannot eat or drink. ?You have nausea and vomiting. ?You have diarrhea. ?Get help right away if: ?You have severe hypoglycemia. This means your blood glucose is lower than 54 mg/dL (3.0 mmol/L). ?You become confused or you have trouble thinking clearly. ?You have difficulty breathing. ?You have moderate or large ketone levels in your urine. ?These symptoms may represent a serious problem that is an emergency. Do not wait to see if the symptoms will go away. Get medical help right away. Call your local emergency services (911 in the [place] Do not drive yourself to the hospital. ?Summary ?Type 2 diabetes mellitus is a long-term, or chronic, disease. In type 2 diabetes, the pancreas does not make enough of a hormone called insulin, or cells in the body do not respond properly to insulin that the body makes. ?This condition is treated by making dietary and lifestyle changes and taking diabetes medicines or  insulin. ?Your health care provider will set treatment goals for you. Your goals will be based on your age, other medical conditions you have, and how you respond to diabetes treatment. ?Keep all follow-up visits. This is important. ?This information is not intended to replace advice given to you by your health care provider. Make sure you discuss any questions you have with your health care provider. ?Document Revised: 12/16/2020 Document Reviewed: 12/16/2020 ?Elsevier Patient Education ? 2022 [employer] ? ?

## 2021-08-22 ENCOUNTER — Encounter: Payer: Self-pay | Admitting: Internal Medicine

## 2021-09-08 ENCOUNTER — Telehealth: Payer: Self-pay | Admitting: Internal Medicine

## 2021-09-08 ENCOUNTER — Other Ambulatory Visit: Payer: Self-pay | Admitting: Internal Medicine

## 2021-09-08 DIAGNOSIS — I10 Essential (primary) hypertension: Secondary | ICD-10-CM

## 2021-09-08 DIAGNOSIS — E785 Hyperlipidemia, unspecified: Secondary | ICD-10-CM

## 2021-09-08 DIAGNOSIS — E118 Type 2 diabetes mellitus with unspecified complications: Secondary | ICD-10-CM

## 2021-09-09 ENCOUNTER — Ambulatory Visit: Payer: Medicare Other | Admitting: Internal Medicine

## 2021-09-09 ENCOUNTER — Telehealth: Payer: Self-pay

## 2021-09-09 NOTE — Chronic Care Management (AMB) (Signed)
Chronic Care Management Pharmacy Assistant   Name: Lauren Orozco  MRN: [id number] DOB: [birth year]   Reason for Encounter: Disease State   Conditions to be addressed/monitored: DMII   Recent office visits:  08/18/21 Lauren Lima, MD-PCP (Hypertension, Diabetes  Depression  Back Pain) Labs ordered, med change: duloxetine 30 mg, tramadol 50 mg  Recent consult visits:  None ID  Hospital visits:  None since last coordination call  Medications: Outpatient Encounter Medications as of 09/09/2021  Medication Sig   Accu-Chek Softclix Lancets lancets Use to check blood sugar daily. DX: E11.9   acetaminophen (TYLENOL) 500 MG tablet Take 500 mg by mouth every 6 (six) hours as needed.   Blood Glucose Monitoring Suppl (ACCU-CHEK GUIDE ME) w/Device KIT 1 Act by Does not apply route in the morning and at bedtime.   Cholecalciferol 50 MCG (2000 UT) TABS Take 2 tablets (4,000 Units total) by mouth daily.   Dapagliflozin-metFORMIN HCl ER (XIGDUO XR) 10-500 MG TB24 Take 1 tablet by mouth daily.   diclofenac Sodium (VOLTAREN) 1 % GEL Apply 2 g topically 4 (four) times daily.   DULoxetine (CYMBALTA) 30 MG capsule Take 1 capsule (30 mg total) by mouth daily.   gabapentin (NEURONTIN) 300 MG capsule Take 1 capsule (300 mg total) by mouth 3 (three) times daily as needed (nerve pain).   glucose blood (ACCU-CHEK GUIDE) test strip Use to check blood sugar daily. DX: E11.9   indapamide (LOZOL) 1.25 MG tablet TAKE 1 TABLET BY MOUTH EVERY DAY   irbesartan (AVAPRO) 150 MG tablet TAKE 1 TABLET(150 MG) BY MOUTH DAILY   rosuvastatin (CRESTOR) 20 MG tablet TAKE 1 TABLET(20 MG) BY MOUTH DAILY WITH BREAKFAST   tiZANidine (ZANAFLEX) 2 MG tablet Take 1 tablet (2 mg total) by mouth every 6 (six) hours as needed for muscle spasms.   No facility-administered encounter medications on file as of 09/09/2021.   Recent Relevant Labs: Lab Results  Component Value Date/Time   HGBA1C 6.9 (H) 08/18/2021 09:58 AM    HGBA1C 6.7 (H) 12/05/2020 02:13 PM   MICROALBUR <0.7 12/05/2020 02:13 PM   MICROALBUR 1.4 01/30/2020 04:34 PM    Kidney Function Lab Results  Component Value Date/Time   CREATININE 0.84 08/18/2021 09:58 AM   CREATININE 0.72 04/23/2021 10:10 AM   CREATININE 0.78 12/05/2020 02:13 PM   CREATININE 0.8 01/02/2016 10:34 AM   CREATININE 0.8 12/18/2014 11:12 AM   GFR 73.08 08/18/2021 09:58 AM   GFRNONAA >60 04/23/2021 10:10 AM   GFRAA >60 04/22/2020 01:08 PM    Reviewed chart for medication changes and drug therapy problems ahead of medication adherence call.  Attempted to contact patient x 3 for medication review and health check, unable to reach patient, left voicemails to return call.    Current antihyperglycemic regimen:  Xigduo xr 10-500 mg What recent interventions/DTPs have been made to improve glycemic control:  None noted Have there been any recent hospitalizations or ED visits since last visit with CPP? No  Adherence Review: Is the patient currently on a STATIN medication? Yes Is the patient currently on ACE/ARB medication? Yes Does the patient have >5 day gap between last estimated fill dates? No   Care Gaps: Colonoscopy-11/28/14 Diabetic Foot Exam-08/18/21 Mammogram-02/22/20 Ophthalmology-03/13/21 Dexa Scan - NA Annual Well Visit -  Micro albumin-12/05/20 Hemoglobin A1c- 08/18/21  Star Rating Drugs: Irbesartan 150 mcg-last fill 12/06/20 90 ds Rosuvastatin 20 mg-last fill  05/17/21 90 ds Dapagliflozin-metformin HCI ER 10-500 mg-last fill 03/26/21 90 ds  [place] Pharmacist Assistant [phone]

## 2021-09-12 DIAGNOSIS — H2513 Age-related nuclear cataract, bilateral: Secondary | ICD-10-CM | POA: Diagnosis not present

## 2021-09-12 DIAGNOSIS — E119 Type 2 diabetes mellitus without complications: Secondary | ICD-10-CM | POA: Diagnosis not present

## 2021-09-12 DIAGNOSIS — H40053 Ocular hypertension, bilateral: Secondary | ICD-10-CM | POA: Diagnosis not present

## 2021-11-05 ENCOUNTER — Other Ambulatory Visit: Payer: Self-pay | Admitting: Internal Medicine

## 2021-11-05 DIAGNOSIS — I10 Essential (primary) hypertension: Secondary | ICD-10-CM

## 2021-11-15 IMAGING — DX DG CHEST 2V
2 series · 2 of 2 positions shown · non-contrast
Comparison: None.

CLINICAL DATA: Chronic intrascapular pain; worsening. Hx of HTN,
NHL. Ex smoker. bilateral Adalid Ullman pain for several
months

EXAM:
CHEST - 2 VIEW

[chest pa]
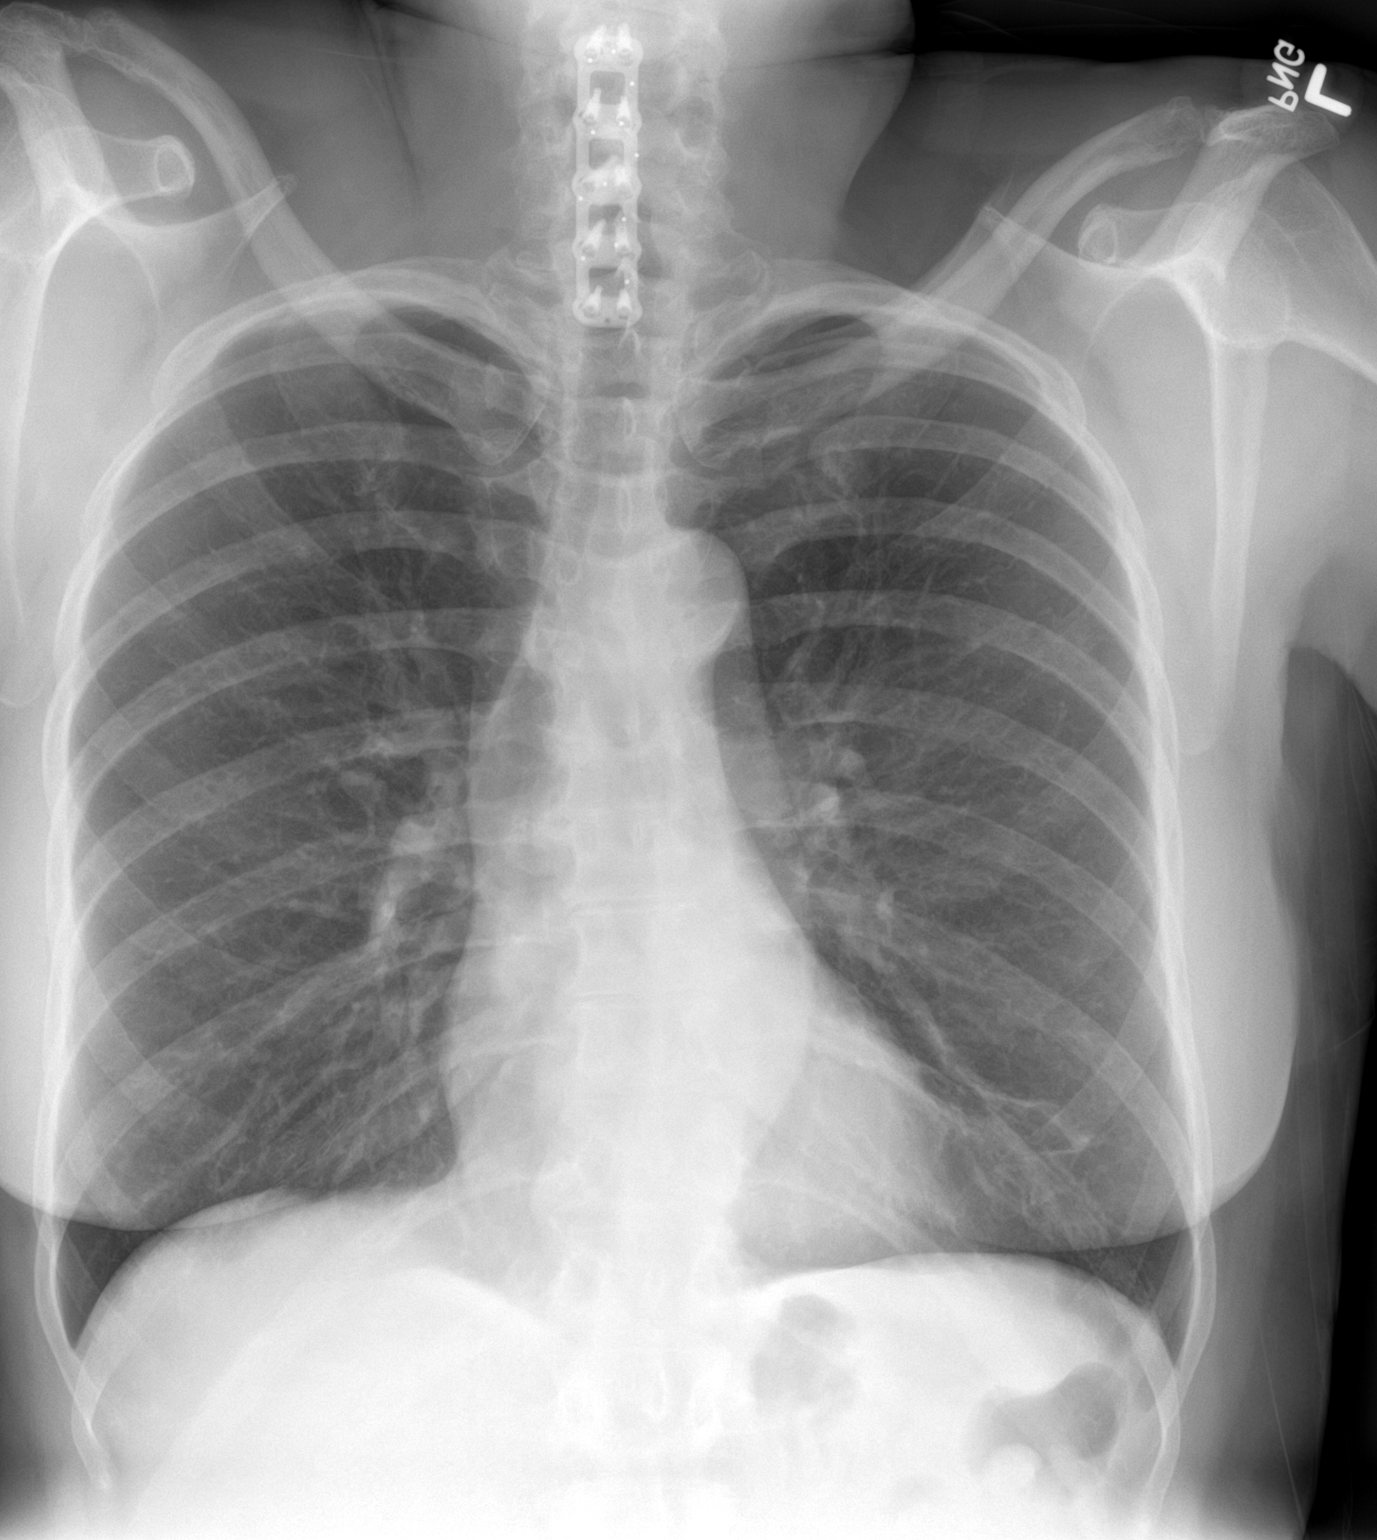

[chest lat]
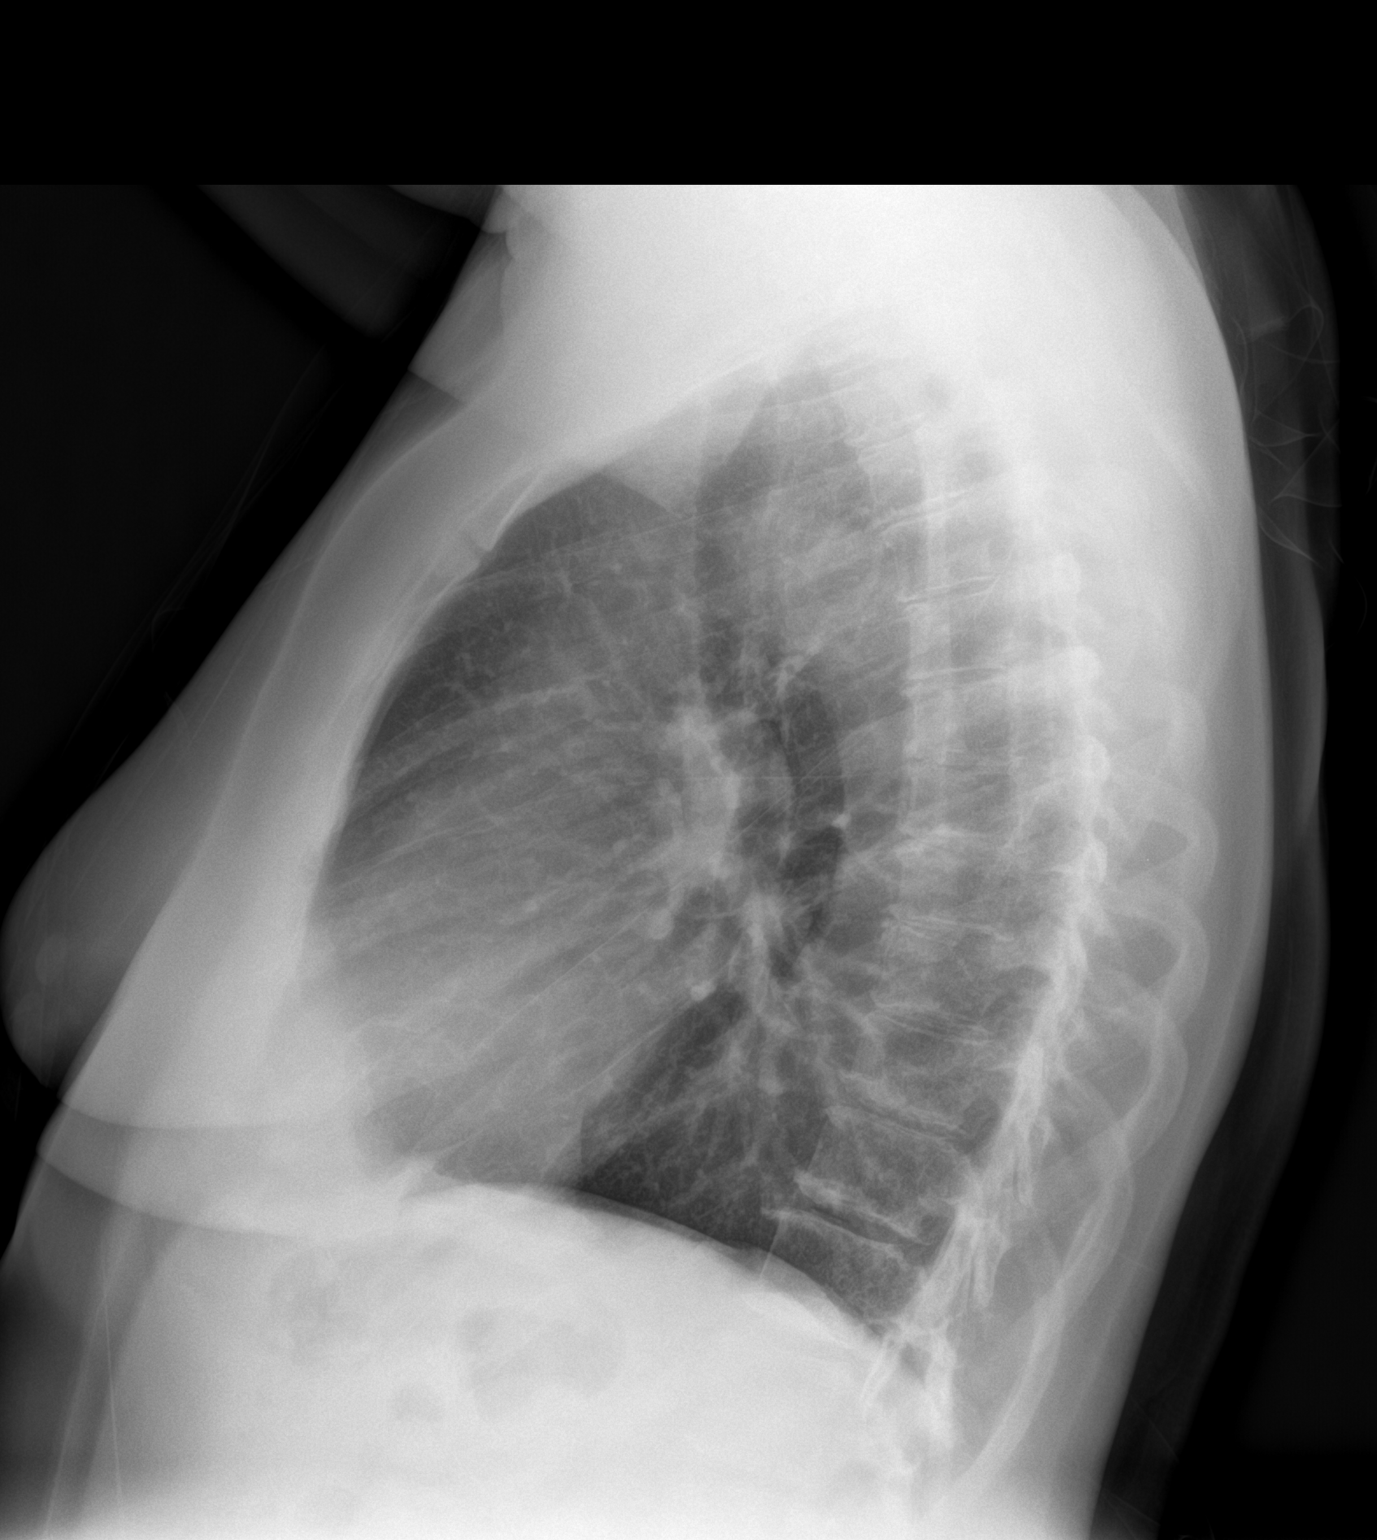

[2 of 2 positions shown; findings below may reference images not displayed]

FINDINGS: Normal mediastinum and cardiac silhouette. Hyperinflated lungs.
Chronic central bronchitic markings. Normal pulmonary vasculature.
No effusion, infiltrate, or pneumothorax.
IMPRESSION: Chronic bronchitic markings. No acute findings. Lungs hyperinflated.

## 2022-01-22 ENCOUNTER — Telehealth: Payer: Self-pay

## 2022-01-22 NOTE — Telephone Encounter (Signed)
-----   Message from Heath Lark, MD sent at 01/20/2022  2:46 PM EDT ----- ?Pls help her schedule CT for 7/18 ? ?

## 2022-01-22 NOTE — Telephone Encounter (Signed)
Called and given 4 pm CT appt at [facility] on 7/18 after lab appt at 3:30 pm. She verbalized understanding. Reviewed all appts. ?

## 2022-02-04 ENCOUNTER — Ambulatory Visit: Payer: Medicare Other | Admitting: Podiatry

## 2022-02-04 ENCOUNTER — Encounter: Payer: Self-pay | Admitting: Podiatry

## 2022-02-04 DIAGNOSIS — B351 Tinea unguium: Secondary | ICD-10-CM

## 2022-02-04 DIAGNOSIS — M79675 Pain in left toe(s): Secondary | ICD-10-CM | POA: Diagnosis not present

## 2022-02-04 DIAGNOSIS — M79674 Pain in right toe(s): Secondary | ICD-10-CM

## 2022-02-04 NOTE — Progress Notes (Signed)
?  Subjective:  ?Patient ID: Lauren Orozco, female    DOB: May 02, 1956,  MRN: [id number] ? ?Chief Complaint  ?Patient presents with  ? Nail Problem  ? ?66 y.o. female returns for the above complaint.  Patient presents with thickened elongated dystrophic toenails x10.  Mild pain on palpation.  Patient states painful to touch she would like for me to debride them down. ? ?Objective:  ?There were no vitals filed for this visit. ?Podiatric Exam: ?Vascular: dorsalis pedis and posterior tibial pulses are palpable bilateral. Capillary return is immediate. Temperature gradient is WNL. Skin turgor WNL  ?Sensorium: Normal Semmes Weinstein monofilament test. Normal tactile sensation bilaterally. ?Nail Exam: Pt has thick disfigured discolored nails with subungual debris noted bilateral entire nail hallux through fifth toenails.  Pain on palpation to the nails. ?Ulcer Exam: There is no evidence of ulcer or pre-ulcerative changes or infection. ?Orthopedic Exam: Muscle tone and strength are WNL. No limitations in general ROM. No crepitus or effusions noted.  ?Skin: No Porokeratosis. No infection or ulcers ? ? ? ?Assessment & Plan:  ? ?1. Pain due to onychomycosis of toenails of both feet   ? ? ?Patient was evaluated and treated and all questions answered. ? ?Onychomycosis with pain  ?-Nails palliatively debrided as below. ?-Educated on self-care ? ?Procedure: Nail Debridement ?Rationale: pain  ?Type of Debridement: manual, sharp debridement. ?Instrumentation: Nail nipper, rotary burr. ?Number of Nails: 10 ? ?Procedures and Treatment: Consent by patient was obtained for treatment procedures. The patient understood the discussion of treatment and procedures well. All questions were answered thoroughly reviewed. Debridement of mycotic and hypertrophic toenails, 1 through 5 bilateral and clearing of subungual debris. No ulceration, no infection noted.  ?Return Visit-Office Procedure: Patient instructed to return to the office  for a follow up visit 3 months for continued evaluation and treatment. ? ?Boneta Lucks, DPM ?  ? ?No follow-ups on file. ? ?

## 2022-02-27 ENCOUNTER — Other Ambulatory Visit: Payer: Self-pay | Admitting: Internal Medicine

## 2022-02-27 DIAGNOSIS — I10 Essential (primary) hypertension: Secondary | ICD-10-CM

## 2022-03-16 ENCOUNTER — Telehealth: Payer: Self-pay

## 2022-03-16 NOTE — Telephone Encounter (Signed)
Please call pt to schedule or reschedule AWV. Thanks  

## 2022-03-18 ENCOUNTER — Ambulatory Visit: Payer: Medicare Other

## 2022-03-23 ENCOUNTER — Ambulatory Visit (INDEPENDENT_AMBULATORY_CARE_PROVIDER_SITE_OTHER): Payer: Medicare Other

## 2022-03-23 VITALS — Wt 154.0 lb

## 2022-03-23 DIAGNOSIS — Z780 Asymptomatic menopausal state: Secondary | ICD-10-CM

## 2022-03-23 DIAGNOSIS — Z1231 Encounter for screening mammogram for malignant neoplasm of breast: Secondary | ICD-10-CM | POA: Diagnosis not present

## 2022-03-23 DIAGNOSIS — Z0000 Encounter for general adult medical examination without abnormal findings: Secondary | ICD-10-CM | POA: Diagnosis not present

## 2022-03-23 NOTE — Patient Instructions (Signed)
Lauren Orozco , Thank you for taking time to come for your Medicare Wellness Visit. I appreciate your ongoing commitment to your health goals. Please review the following plan we discussed and let me know if I can assist you in the future.   Screening recommendations/referrals: Colonoscopy: Done 11/28/2014 - Repeat in 5 years **past due - Consider making appointment soon Mammogram: Done 02/22/2020 - Repeat annually *past due - I sent referral to [facility] - make appointment soon Bone Density: Due - Repeat every 2 years - I sent referral - Make appointment with Mammogram Recommended yearly ophthalmology/optometry visit for glaucoma screening and checkup Recommended yearly dental visit for hygiene and checkup  Vaccinations: Influenza vaccine: Done 08/18/2021 - Repeat annually Pneumococcal vaccine: Done  10/13/2016 & 07/11/2020    Tdap vaccine: Done 08/24/2014 - Repeat in 10 years Shingles vaccine: Due - Shingrix is 2 doses 2-6 months apart and over 90% effective  *consider getting this   Covid-19: Done 11/26/2019, 12/20/2019, & 09/24/2020  Advanced directives: Please bring a copy of your health care power of attorney and living will to the office to be added to your chart at your convenience.   Conditions/risks identified: Aim for 30 minutes of exercise or brisk walking, 6-8 glasses of water, and 5 servings of fruits and vegetables each day.   Next appointment: Follow up in one year for your annual wellness visit    Preventive Care 65 Years and Older, Female Preventive care refers to lifestyle choices and visits with your health care provider that can promote health and wellness. What does preventive care include? A yearly physical exam. This is also called an annual well check. Dental exams once or twice a year. Routine eye exams. Ask your health care provider how often you should have your eyes checked. Personal lifestyle choices, including: Daily care of your teeth and  gums. Regular physical activity. Eating a healthy diet. Avoiding tobacco and drug use. Limiting alcohol use. Practicing safe sex. Taking low-dose aspirin every day. Taking vitamin and mineral supplements as recommended by your health care provider. What happens during an annual well check? The services and screenings done by your health care provider during your annual well check will depend on your age, overall health, lifestyle risk factors, and family history of disease. Counseling  Your health care provider may ask you questions about your: Alcohol use. Tobacco use. Drug use. Emotional well-being. Home and relationship well-being. Sexual activity. Eating habits. History of falls. Memory and ability to understand (cognition). Work and work Statistician. Reproductive health. Screening  You may have the following tests or measurements: Height, weight, and BMI. Blood pressure. Lipid and cholesterol levels. These may be checked every 5 years, or more frequently if you are over 11 years old. Skin check. Lung cancer screening. You may have this screening every year starting at age 25 if you have a 30-pack-year history of smoking and currently smoke or have quit within the past 15 years. Fecal occult blood test (FOBT) of the stool. You may have this test every year starting at age 32. Flexible sigmoidoscopy or colonoscopy. You may have a sigmoidoscopy every 5 years or a colonoscopy every 10 years starting at age 37. Hepatitis C blood test. Hepatitis B blood test. Sexually transmitted disease (STD) testing. Diabetes screening. This is done by checking your blood sugar (glucose) after you have not eaten for a while (fasting). You may have this done every 1-3 years. Bone density scan. This is done to screen for osteoporosis.  You may have this done starting at age 36. Mammogram. This may be done every 1-2 years. Talk to your health care provider about how often you should have regular  mammograms. Talk with your health care provider about your test results, treatment options, and if necessary, the need for more tests. Vaccines  Your health care provider may recommend certain vaccines, such as: Influenza vaccine. This is recommended every year. Tetanus, diphtheria, and acellular pertussis (Tdap, Td) vaccine. You may need a Td booster every 10 years. Zoster vaccine. You may need this after age 42. Pneumococcal 13-valent conjugate (PCV13) vaccine. One dose is recommended after age 12. Pneumococcal polysaccharide (PPSV23) vaccine. One dose is recommended after age 87. Talk to your health care provider about which screenings and vaccines you need and how often you need them. This information is not intended to replace advice given to you by your health care provider. Make sure you discuss any questions you have with your health care provider. Document Released: 10/18/2015 Document Revised: 06/10/2016 Document Reviewed: 07/23/2015 Elsevier Interactive Patient Education  2017 [place] Prevention in the Home Falls can cause injuries. They can happen to people of all ages. There are many things you can do to make your home safe and to help prevent falls. What can I do on the outside of my home? Regularly fix the edges of walkways and driveways and fix any cracks. Remove anything that might make you trip as you walk through a door, such as a raised step or threshold. Trim any bushes or trees on the path to your home. Use bright outdoor lighting. Clear any walking paths of anything that might make someone trip, such as rocks or tools. Regularly check to see if handrails are loose or broken. Make sure that both sides of any steps have handrails. Any raised decks and porches should have guardrails on the edges. Have any leaves, snow, or ice cleared regularly. Use sand or salt on walking paths during winter. Clean up any spills in your garage right away. This includes oil  or grease spills. What can I do in the bathroom? Use night lights. Install grab bars by the toilet and in the tub and shower. Do not use towel bars as grab bars. Use non-skid mats or decals in the tub or shower. If you need to sit down in the shower, use a plastic, non-slip stool. Keep the floor dry. Clean up any water that spills on the floor as soon as it happens. Remove soap buildup in the tub or shower regularly. Attach bath mats securely with double-sided non-slip rug tape. Do not have throw rugs and other things on the floor that can make you trip. What can I do in the bedroom? Use night lights. Make sure that you have a light by your bed that is easy to reach. Do not use any sheets or blankets that are too big for your bed. They should not hang down onto the floor. Have a firm chair that has side arms. You can use this for support while you get dressed. Do not have throw rugs and other things on the floor that can make you trip. What can I do in the kitchen? Clean up any spills right away. Avoid walking on wet floors. Keep items that you use a lot in easy-to-reach places. If you need to reach something above you, use a strong step stool that has a grab bar. Keep electrical cords out of the way. Do not use  floor polish or wax that makes floors slippery. If you must use wax, use non-skid floor wax. Do not have throw rugs and other things on the floor that can make you trip. What can I do with my stairs? Do not leave any items on the stairs. Make sure that there are handrails on both sides of the stairs and use them. Fix handrails that are broken or loose. Make sure that handrails are as long as the stairways. Check any carpeting to make sure that it is firmly attached to the stairs. Fix any carpet that is loose or worn. Avoid having throw rugs at the top or bottom of the stairs. If you do have throw rugs, attach them to the floor with carpet tape. Make sure that you have a light  switch at the top of the stairs and the bottom of the stairs. If you do not have them, ask someone to add them for you. What else can I do to help prevent falls? Wear shoes that: Do not have high heels. Have rubber bottoms. Are comfortable and fit you well. Are closed at the toe. Do not wear sandals. If you use a stepladder: Make sure that it is fully opened. Do not climb a closed stepladder. Make sure that both sides of the stepladder are locked into place. Ask someone to hold it for you, if possible. Clearly mark and make sure that you can see: Any grab bars or handrails. First and last steps. Where the edge of each step is. Use tools that help you move around (mobility aids) if they are needed. These include: Canes. Walkers. Scooters. Crutches. Turn on the lights when you go into a dark area. Replace any light bulbs as soon as they burn out. Set up your furniture so you have a clear path. Avoid moving your furniture around. If any of your floors are uneven, fix them. If there are any pets around you, be aware of where they are. Review your medicines with your doctor. Some medicines can make you feel dizzy. This can increase your chance of falling. Ask your doctor what other things that you can do to help prevent falls. This information is not intended to replace advice given to you by your health care provider. Make sure you discuss any questions you have with your health care provider. Document Released: 07/18/2009 Document Revised: 02/27/2016 Document Reviewed: 10/26/2014 Elsevier Interactive Patient Education  2017 [employer].

## 2022-03-23 NOTE — Progress Notes (Signed)
Subjective:   Lauren Orozco is a 66 y.o. female who presents for Medicare Annual (Subsequent) preventive examination.  Virtual Visit via Telephone Note  I connected with  Lauren Orozco on 03/23/22 at  2:00 PM EDT by telephone and verified that I am speaking with the correct person using two identifiers.  Location: Patient: Home Provider: [facility] Persons participating in the virtual visit: patient/Nurse Health Advisor   I discussed the limitations, risks, security and privacy concerns of performing an evaluation and management service by telephone and the availability of in person appointments. The patient expressed understanding and agreed to proceed.  Interactive audio and video telecommunications were attempted between this nurse and patient, however failed, due to patient having technical difficulties OR patient did not have access to video capability.  We continued and completed visit with audio only.  Some vital signs may be absent or patient reported.   Klara Stjames E Yamileth Hayse, LPN   Review of Systems     Cardiac Risk Factors include: advanced age (>98mn, >>50women);dyslipidemia;hypertension;diabetes mellitus;smoking/ tobacco exposure     Objective:    Today's Vitals   03/23/22 1401  Weight: 154 lb (69.9 kg)  PainSc: 6    Body mass index is 24.86 kg/m.     03/23/2022    2:14 PM 03/29/2021    7:25 AM 03/01/2021   12:31 AM 03/14/2020    6:01 PM 04/10/2019    7:44 PM 12/22/2018    2:48 PM 03/21/2017   12:59 PM  Advanced Directives  Does Patient Have a Medical Advance Directive? Yes _0  No  [place] of [place]        Copy of [place] Chart? No - copy requested        Would patient like information on creating a medical advance directive?    No - Patient declined       Current Medications (verified) Outpatient Encounter Medications as of 03/23/2022  Medication Sig   Accu-Chek  Softclix Lancets lancets Use to check blood sugar daily. DX: E11.9   acetaminophen (TYLENOL) 500 MG tablet Take 500 mg by mouth every 6 (six) hours as needed.   Blood Glucose Monitoring Suppl (ACCU-CHEK GUIDE ME) w/Device KIT 1 Act by Does not apply route in the morning and at bedtime.   Cholecalciferol 50 MCG (2000 UT) TABS Take 2 tablets (4,000 Units total) by mouth daily.   diclofenac Sodium (VOLTAREN) 1 % GEL Apply 2 g topically 4 (four) times daily.   gabapentin (NEURONTIN) 300 MG capsule Take 1 capsule (300 mg total) by mouth 3 (three) times daily as needed (nerve pain).   glucose blood (ACCU-CHEK GUIDE) test strip Use to check blood sugar daily. DX: E11.9   indapamide (LOZOL) 1.25 MG tablet TAKE 1 TABLET BY MOUTH EVERY DAY   rosuvastatin (CRESTOR) 20 MG tablet TAKE 1 TABLET(20 MG) BY MOUTH DAILY WITH BREAKFAST   Dapagliflozin-metFORMIN HCl ER (XIGDUO XR) 10-500 MG TB24 Take 1 tablet by mouth daily. (Patient not taking: Reported on 03/23/2022)   irbesartan (AVAPRO) 150 MG tablet TAKE 1 TABLET(150 MG) BY MOUTH DAILY (Patient not taking: Reported on 03/23/2022)   [DISCONTINUED] DULoxetine (CYMBALTA) 30 MG capsule Take 1 capsule (30 mg total) by mouth daily. (Patient not taking: Reported on 03/23/2022)   [DISCONTINUED] tiZANidine (ZANAFLEX) 2 MG tablet Take 1 tablet (2 mg total) by mouth every 6 (six) hours as needed for muscle spasms.   No facility-administered  encounter medications on file as of 03/23/2022.    Allergies (verified) Ciprofloxacin, Ofloxacin, and Other   History: Past Medical History:  Diagnosis Date   Anxiety    Chronic neck pain    with radiculopathy   Chronic pain of left wrist 2009   "since neck surgery"   Complication of anesthesia    Hard to wake up per pt; after neck sx had to be bagged.   DEPRESSION    DISC DISEASE, CERVICAL    Diverticulitis    HYPERLIPIDEMIA    Hypertension    Lymphocytosis 01/02/2016   NEPHROLITHIASIS, HX OF    NHL (non-Hodgkin's  lymphoma) ([place]) 12/25/2014   Recurrent abdominal pain    Umbilical hernia    UTI'S, CHRONIC    Past Surgical History:  Procedure Laterality Date   ABDOMINAL HYSTERECTOMY  2001   Lupus SURGERY  2007   LEFT OOPHORECTOMY  2001   [place]   NECK SURGERY  2009   fusion of C1, C2, C3   Family History  Problem Relation Age of Onset   Hypertension Mother    Diabetes Mother    Hyperlipidemia Mother    Heart attack Father        MI   Hyperlipidemia Father    Hypertension Father    Diabetes Father    Heart disease Father    Cancer Neg Hx        Negative for Breast or Colon Cancer   Colon cancer Neg Hx    Social History   Socioeconomic History   Marital status: Single    Spouse name: Not on file   Number of children: 1   Years of education: HSG   Highest education level: Not on file  Occupational History   Occupation: Disabled  Tobacco Use   Smoking status: Former    Packs/day: 0.90    Years: 30.00    Total pack years: 27.00    Types: Cigarettes    Quit date: 12/18/2019    Years since quitting: 2.2   Smokeless tobacco: Never  Vaping Use   Vaping Use: Never used  Substance and Sexual Activity   Alcohol use: No   Drug use: No   Sexual activity: Not Currently  Other Topics Concern   Not on file  Social History Narrative   HSG, [employer]-CNA (in process). [place]. Daughter in '91 [place]. Work-disability due to neck and arthritis problems. Lives-alone. No history of physical or sexual abuse   Social Determinants of Health   Financial Resource Strain: Low Risk  (03/23/2022)   Overall Financial Resource Strain (CARDIA)    Difficulty of Paying Living Expenses: Not hard at all  Food Insecurity: No Food Insecurity (03/23/2022)   Hunger Vital Sign    Worried About Running Out of Food in the Last Year: Never true    Ran Out of Food in the Last Year: Never true  Transportation Needs: No Transportation Needs (03/23/2022)   PRAPARE -  Hydrologist (Medical): No    Lack of Transportation (Non-Medical): No  Physical Activity: Sufficiently Active (03/23/2022)   Exercise Vital Sign    Days of Exercise per Week: 3 days    Minutes of Exercise per Session: 50 min  Stress: No Stress Concern Present (03/23/2022)   [place]    Feeling of Stress : Not at all  Social Connections: Moderately Integrated (03/23/2022)  Social Licensed conveyancer [NHANES]    Frequency of Communication with Friends and Family: More than three times a week    Frequency of Social Gatherings with Friends and Family: More than three times a week    Attends Religious Services: More than 4 times per year    Active Member of [employer] or Organizations: Yes    Attends Music therapist: More than 4 times per year    Marital Status: Never married    Tobacco Counseling Counseling given: Not Answered   Clinical Intake:  Pre-visit preparation completed: Yes  Pain : 0-10 Pain Score: 6  Pain Type: Chronic pain Pain Location: Neck Pain Orientation: Posterior Pain Descriptors / Indicators: Aching, Discomfort, Sore Pain Onset: More than a month ago Pain Frequency: Intermittent     BMI - recorded: 24.86 Nutritional Status: BMI of 19-24  Normal Nutritional Risks: None Diabetes: Yes CBG done?: No Did pt. bring in CBG monitor from home?: No  How often do you need to have someone help you when you read instructions, pamphlets, or other written materials from your doctor or pharmacy?: 1 - Never  Diabetic? Nutrition Risk Assessment:  Has the patient had any N/V/D within the last 2 months?  No  Does the patient have any non-healing wounds?  No  Has the patient had any unintentional weight loss or weight gain?  No   Diabetes:  Is the patient diabetic?  Yes  If diabetic, was a CBG obtained today?  No  Did the patient bring in their  glucometer from home?  No  How often do you monitor your CBG's? 3x per week - usually around 110.   Financial Strains and Diabetes Management:  Are you having any financial strains with the device, your supplies or your medication? No .  Does the patient want to be seen by Chronic Care Management for management of their diabetes?  No  Would the patient like to be referred to a Nutritionist or for Diabetic Management?  No   Diabetic Exams:  Diabetic Eye Exam: Completed 09/12/2021.   Diabetic Foot Exam: Completed 08/18/2021. Pt has been advised about the importance in completing this exam.   Interpreter Needed?: No  Information entered by :: Amy Hopkins, LPN   Activities of Daily Living    03/23/2022    2:14 PM 08/18/2021    9:20 AM  In your present state of health, do you have any difficulty performing the following activities:  Hearing? 0 0  Vision? 0 0  Difficulty concentrating or making decisions? 0 0  Walking or climbing stairs? 1 0  Comment hurts hips   Dressing or bathing? 0 0  Doing errands, shopping? 0 0  Preparing Food and eating ? N   Using the Toilet? N   In the past six months, have you accidently leaked urine? N   Do you have problems with loss of bowel control? N   Managing your Medications? N   Managing your Finances? N   Housekeeping or managing your Housekeeping? N     Patient Care Team: Janith Lima, MD as PCP - General (Internal Medicine) Heath Lark, MD as Consulting Physician (Hematology and Oncology) Alda Berthold, DO as Consulting Physician (Neurology) Charlton Haws, [facility] as Pharmacist (Pharmacist)  Indicate any recent Medical Services you may have received from other than [facility] providers in the past year (date may be approximate).     Assessment:   This is a routine wellness examination for  Lauren Orozco.  Hearing/Vision screen Hearing Screening - Comments:: Denies hearing difficulties   Vision Screening - Comments:: Wears rx glasses -  up to date with routine eye exams with [facility]  Dietary issues and exercise activities discussed: Current Exercise Habits: Home exercise routine, Type of exercise: walking, Time (Minutes): 45, Frequency (Times/Week): 3, Weekly Exercise (Minutes/Week): 135, Intensity: Mild, Exercise limited by: orthopedic condition(s)   Goals Addressed             This Visit's Progress    COMPLETED: I want to go to dentist, and get the moles I have removed.       That would make me feel better about myself.     Patient Stated       Stay active and independent       Depression Screen    03/23/2022    2:11 PM 03/14/2020    6:02 PM 01/30/2020    4:00 PM 12/21/2018   10:21 AM 12/20/2018   11:07 AM 01/13/2018   10:25 AM 03/09/2017   10:41 AM  PHQ 2/9 Scores  PHQ - 2 Score 0 0 0 0 0 0 0  PHQ- 9 Score   0  0 0     Fall Risk    03/23/2022    2:04 PM 08/18/2021    9:23 AM 03/14/2020    6:02 PM 01/30/2020    4:00 PM 04/05/2019    3:15 PM  Fall Risk   Falls in the past year? 1 0 0 0 0  Number falls in past yr: 0   0 0  Injury with Fall? 0   0 0  Risk for fall due to : History of fall(s);Orthopedic patient   No Fall Risks   Follow up Falls prevention discussed   Falls evaluation completed Falls evaluation completed    [place]:  Any stairs in or around the home? Yes  If so, are there any without handrails? No  Home free of loose throw rugs in walkways, pet beds, electrical cords, etc? Yes  Adequate lighting in your home to reduce risk of falls? Yes   ASSISTIVE DEVICES UTILIZED TO PREVENT FALLS:  Life alert? No  Use of a cane, walker or w/c? No  Grab bars in the bathroom? No  Shower chair or bench in shower? No  Elevated toilet seat or a handicapped toilet? No   TIMED UP AND GO:  Was the test performed? No . Telephonic visit  Cognitive Function:        03/23/2022    2:16 PM  6CIT Screen  What Year? 0 points  What month? 0 points  What time? 0 points   Count back from 20 0 points  Months in reverse 2 points  Repeat phrase 0 points  Total Score 2 points    Immunizations Immunization History  Administered Date(s) Administered   Fluad Quad(high Dose 65+) 08/18/2021   Influenza,inj,Quad PF,6+ Mos 07/11/2020   PFIZER(Purple Top)SARS-COV-2 Vaccination 11/26/2019, 12/20/2019, 09/24/2020   Pneumococcal Conjugate-13 10/13/2016   Pneumococcal Polysaccharide-23 07/11/2020   Tdap 08/24/2014    TDAP status: Up to date  Flu Vaccine status: Up to date  Pneumococcal vaccine status: Up to date  Covid-19 vaccine status: Completed vaccines  Qualifies for Shingles Vaccine? Yes   Zostavax completed Yes   Shingrix Completed?: No.    Education has been provided regarding the importance of this vaccine. Patient has been advised to call insurance company to determine out of pocket expense  if they have not yet received this vaccine. Advised may also receive vaccine at local pharmacy or Health Dept. Verbalized acceptance and understanding.  Screening Tests Health Maintenance  Topic Date Due   Zoster Vaccines- Shingrix (1 of 2) Never done   COVID-19 Vaccine (4 - Booster for [employer] series) 11/19/2020   DEXA SCAN  Never done   HEMOGLOBIN A1C  02/15/2022   MAMMOGRAM  02/21/2022   OPHTHALMOLOGY EXAM  03/13/2022   INFLUENZA VACCINE  05/05/2022   FOOT EXAM  08/18/2022   TETANUS/TDAP  08/24/2024   COLONOSCOPY (Pts 45-79yr Insurance coverage will need to be confirmed)  11/28/2024   Pneumonia Vaccine 66 Years old (3 - PPSV23 if available, else PCV20) 07/11/2025   Hepatitis C Screening  Completed   HIV Screening  Completed   HPV VACCINES  Aged Out    Health Maintenance  Health Maintenance Due  Topic Date Due   Zoster Vaccines- Shingrix (1 of 2) Never done   COVID-19 Vaccine (4 - Booster for [employer] series) 11/19/2020   DEXA SCAN  Never done   HEMOGLOBIN A1C  02/15/2022   MAMMOGRAM  02/21/2022   OPHTHALMOLOGY EXAM  03/13/2022    Colorectal  cancer screening: Type of screening: Colonoscopy. Completed 11/28/2014. Repeat every 5 years - She knows this is past due, but declines referral at this time.  Mammogram status: Ordered 03/23/2021. Pt provided with contact info and advised to call to schedule appt.   DEXA: Due - never done - ordered today  Lung Cancer Screening: (Low Dose CT Chest recommended if Age 66-80years, 30 pack-year currently smoking OR have quit w/in 15years.) does qualify.   Lung Cancer Screening Referral: has theses annually - next appt next month  Additional Screening:  Hepatitis C Screening: does qualify; Completed 09/03/2014  Vision Screening: Recommended annual ophthalmology exams for early detection of glaucoma and other disorders of the eye. Is the patient up to date with their annual eye exam?  Yes  Who is the provider or what is the name of the office in which the patient attends annual eye exams? [facility]If pt is not established with a provider, would they like to be referred to a provider to establish care? No .   Dental Screening: Recommended annual dental exams for proper oral hygiene  Community Resource Referral / Chronic Care Management: CRR required this visit?  No   CCM required this visit?  No      Plan:     I have personally reviewed and noted the following in the patient's chart:   Medical and social history Use of alcohol, tobacco or illicit drugs  Current medications and supplements including opioid prescriptions.  Functional ability and status Nutritional status Physical activity Advanced directives List of other physicians Hospitalizations, surgeries, and ER visits in previous 12 months Vitals Screenings to include cognitive, depression, and falls Referrals and appointments  In addition, I have reviewed and discussed with patient certain preventive protocols, quality metrics, and best practice recommendations. A written personalized care plan for preventive services as well  as general preventive health recommendations were provided to patient.     ASandrea Hammond LPN   [id number]  Nurse Notes: stopped taking Cymbalta, Irbesartan, and Xigduo

## 2022-03-25 DIAGNOSIS — H5213 Myopia, bilateral: Secondary | ICD-10-CM | POA: Diagnosis not present

## 2022-03-25 DIAGNOSIS — H2513 Age-related nuclear cataract, bilateral: Secondary | ICD-10-CM | POA: Diagnosis not present

## 2022-03-25 DIAGNOSIS — E119 Type 2 diabetes mellitus without complications: Secondary | ICD-10-CM | POA: Diagnosis not present

## 2022-03-25 LAB — HM DIABETES EYE EXAM

## 2022-04-16 ENCOUNTER — Encounter: Payer: Medicare Other | Admitting: Internal Medicine

## 2022-04-21 ENCOUNTER — Ambulatory Visit (HOSPITAL_COMMUNITY)
Admission: RE | Admit: 2022-04-21 | Discharge: 2022-04-21 | Disposition: A | Discharge status: Home / Self Care | Payer: Medicare Other | Source: Ambulatory Visit | Attending: Hematology and Oncology | Admitting: Hematology and Oncology

## 2022-04-21 ENCOUNTER — Ambulatory Visit
Admission: RE | Admit: 2022-04-21 | Discharge: 2022-04-21 | Disposition: A | Discharge status: Home / Self Care | Payer: Medicare Other | Source: Ambulatory Visit | Attending: Internal Medicine | Admitting: Internal Medicine

## 2022-04-21 ENCOUNTER — Inpatient Hospital Stay: Payer: Medicare Other | Attending: Hematology and Oncology

## 2022-04-21 ENCOUNTER — Other Ambulatory Visit: Payer: Self-pay

## 2022-04-21 DIAGNOSIS — Z720 Tobacco use: Secondary | ICD-10-CM

## 2022-04-21 DIAGNOSIS — F17210 Nicotine dependence, cigarettes, uncomplicated: Secondary | ICD-10-CM | POA: Insufficient documentation

## 2022-04-21 DIAGNOSIS — C8590 Non-Hodgkin lymphoma, unspecified, unspecified site: Secondary | ICD-10-CM | POA: Insufficient documentation

## 2022-04-21 DIAGNOSIS — Z8572 Personal history of non-Hodgkin lymphomas: Secondary | ICD-10-CM | POA: Insufficient documentation

## 2022-04-21 DIAGNOSIS — Z87891 Personal history of nicotine dependence: Secondary | ICD-10-CM | POA: Diagnosis not present

## 2022-04-21 DIAGNOSIS — Z1231 Encounter for screening mammogram for malignant neoplasm of breast: Secondary | ICD-10-CM

## 2022-04-21 LAB — CMP (CANCER CENTER ONLY)
ALT: 16 U/L (ref 0–44)
AST: 14 U/L — ABNORMAL LOW (ref 15–41)
Albumin: 4.4 g/dL (ref 3.5–5.0)
Alkaline Phosphatase: 57 U/L (ref 38–126)
Anion gap: 8 (ref 5–15)
BUN: 14 mg/dL (ref 8–23)
CO2: 29 mmol/L (ref 22–32)
Calcium: 9.7 mg/dL (ref 8.9–10.3)
Chloride: 102 mmol/L (ref 98–111)
Creatinine: 0.83 mg/dL (ref 0.44–1.00)
GFR, Estimated: >60 mL/min (ref >60)
Glucose, Bld: 102 mg/dL — ABNORMAL HIGH (ref 70–99)
Potassium: 3.7 mmol/L (ref 3.5–5.1)
Sodium: 139 mmol/L (ref 135–145)
Total Bilirubin: 0.5 mg/dL (ref 0.3–1.2)
Total Protein: 7.6 g/dL (ref 6.5–8.1)

## 2022-04-21 LAB — CBC WITH DIFFERENTIAL/PLATELET
Abs Immature Granulocytes: 0.02 10*3/uL (ref 0.00–0.07)
Basophils Absolute: 0 10*3/uL (ref 0.0–0.1)
Basophils Relative: 1 %
Eosinophils Absolute: 0.1 10*3/uL (ref 0.0–0.5)
Eosinophils Relative: 1 %
HCT: 39.8 % (ref 36.0–46.0)
Hemoglobin: 13.6 g/dL (ref 12.0–15.0)
Immature Granulocytes: 0 %
Lymphocytes Relative: 60 %
Lymphs Abs: 3.7 10*3/uL (ref 0.7–4.0)
MCH: 30.8 pg (ref 26.0–34.0)
MCHC: 34.2 g/dL (ref 30.0–36.0)
MCV: 90 fL (ref 80.0–100.0)
Monocytes Absolute: 0.5 10*3/uL (ref 0.1–1.0)
Monocytes Relative: 8 %
Neutro Abs: 1.8 10*3/uL (ref 1.7–7.7)
Neutrophils Relative %: 30 %
Platelets: 332 10*3/uL (ref 150–400)
RBC: 4.42 MIL/uL (ref 3.87–5.11)
RDW: 13.3 % (ref 11.5–15.5)
WBC: 6.1 10*3/uL (ref 4.0–10.5)
nRBC: 0 % (ref 0.0–0.2)

## 2022-04-23 ENCOUNTER — Inpatient Hospital Stay: Payer: Medicare Other | Admitting: Hematology and Oncology

## 2022-04-23 ENCOUNTER — Encounter: Payer: Self-pay | Admitting: Hematology and Oncology

## 2022-04-23 ENCOUNTER — Other Ambulatory Visit: Payer: Self-pay

## 2022-04-23 VITALS — BP 147/71 | HR 83 | Temp 97.8°F | Resp 18 | Ht 66.0 in | Wt 153.2 lb

## 2022-04-23 DIAGNOSIS — Z720 Tobacco use: Secondary | ICD-10-CM | POA: Diagnosis not present

## 2022-04-23 DIAGNOSIS — F17210 Nicotine dependence, cigarettes, uncomplicated: Secondary | ICD-10-CM | POA: Diagnosis not present

## 2022-04-23 DIAGNOSIS — R911 Solitary pulmonary nodule: Secondary | ICD-10-CM | POA: Diagnosis not present

## 2022-04-23 DIAGNOSIS — C8590 Non-Hodgkin lymphoma, unspecified, unspecified site: Secondary | ICD-10-CM | POA: Diagnosis not present

## 2022-04-23 NOTE — Assessment & Plan Note (Signed)
From the lymphoma perspective, she has no signs of disease However, given new lung nodules, I will refer her to see pulmonologist for evaluation

## 2022-04-23 NOTE — Progress Notes (Signed)
[place] OFFICE PROGRESS NOTE  Patient Care Team: Janith Lima, MD as PCP - General (Internal Medicine) Heath Lark, MD as Consulting Physician (Hematology and Oncology) Alda Berthold, DO as Consulting Physician (Neurology)  ASSESSMENT & PLAN:  Low grade lymphoma, stage I ([place]) From the lymphoma perspective, she has no signs of disease However, given new lung nodules, I will refer her to see pulmonologist for evaluation  Tobacco abuse The patient is at high risk for cancer due to ongoing smoking Due to findings of lung nodules, I will refer her to pulmonologist for evaluation We discussed importance of nicotine cessation  Orders Placed This Encounter  Procedures   Ambulatory referral to Pulmonology    Referral Priority:   Routine    Referral Type:   Consultation    Referral Reason:   Specialty Services Required    Referred to Provider:   Tanda Rockers, MD    Requested Specialty:   Pulmonary Disease    Number of Visits Requested:   1    All questions were answered. The patient knows to call the clinic with any problems, questions or concerns. The total time spent in the appointment was 20 minutes encounter with patients including review of chart and various tests results, discussions about plan of care and coordination of care plan   Heath Lark, MD 04/23/2022 1:43 PM  INTERVAL HISTORY: Please see below for problem oriented charting. she returns for surveillance follow-up for history of lymphoma She has chronic productive cough especially in the morning.  Denies hemoptysis No new lymphadenopathy  REVIEW OF SYSTEMS:   Constitutional: Denies fevers, chills or abnormal weight loss Eyes: Denies blurriness of vision Ears, nose, mouth, throat, and face: Denies mucositis or sore throat Cardiovascular: Denies palpitation, chest discomfort or lower extremity swelling Gastrointestinal:  Denies nausea, heartburn or change in bowel habits Skin: Denies abnormal  skin rashes Lymphatics: Denies new lymphadenopathy or easy bruising Neurological:Denies numbness, tingling or new weaknesses Behavioral/Psych: Mood is stable, no new changes  All other systems were reviewed with the patient and are negative.  I have reviewed the past medical history, past surgical history, social history and family history with the patient and they are unchanged from previous note.  ALLERGIES:  is allergic to ciprofloxacin, ofloxacin, and other.  MEDICATIONS:  Current Outpatient Medications  Medication Sig Dispense Refill   Accu-Chek Softclix Lancets lancets Use to check blood sugar daily. DX: E11.9 100 each 3   acetaminophen (TYLENOL) 500 MG tablet Take 500 mg by mouth every 6 (six) hours as needed.     Blood Glucose Monitoring Suppl (ACCU-CHEK GUIDE ME) w/Device KIT 1 Act by Does not apply route in the morning and at bedtime. 1 kit 0   Cholecalciferol 50 MCG (2000 UT) TABS Take 2 tablets (4,000 Units total) by mouth daily. 180 tablet 1   glucose blood (ACCU-CHEK GUIDE) test strip Use to check blood sugar daily. DX: E11.9 100 each 3   indapamide (LOZOL) 1.25 MG tablet TAKE 1 TABLET BY MOUTH EVERY DAY 90 tablet 1   rosuvastatin (CRESTOR) 20 MG tablet TAKE 1 TABLET(20 MG) BY MOUTH DAILY WITH BREAKFAST 90 tablet 1   No current facility-administered medications for this visit.    SUMMARY OF ONCOLOGIC HISTORY: Oncology History  Low grade lymphoma, stage I ([place])  12/18/2014 Pathology Results   Accession: [id number] Flow cytometry showed NHL   01/01/2015 Imaging   CT scan of the chest was performed due to chest pressure and it  came back negative.     PHYSICAL EXAMINATION: ECOG PERFORMANCE STATUS: 0 - Asymptomatic  Vitals:   04/23/22 1248  BP: (!) 147/71  Pulse: 83  Resp: 18  Temp: 97.8 F (36.6 C)  SpO2: 98%   Filed Weights   04/23/22 1248  Weight: 153 lb 3.2 oz (69.5 kg)    GENERAL:alert, no distress and comfortable SKIN: skin color, texture, turgor are  normal, no rashes or significant lesions EYES: normal, Conjunctiva are pink and non-injected, sclera clear OROPHARYNX:no exudate, no erythema and lips, buccal mucosa, and tongue normal  NECK: supple, thyroid normal size, non-tender, without nodularity LYMPH:  no palpable lymphadenopathy in the cervical, axillary or inguinal LUNGS: Diffuse bilateral expiratory wheezes HEART: regular rate & rhythm and no murmurs and no lower extremity edema ABDOMEN:abdomen soft, non-tender and normal bowel sounds Musculoskeletal:no cyanosis of digits and no clubbing  NEURO: alert & oriented x 3 with fluent speech, no focal motor/sensory deficits  LABORATORY DATA:  I have reviewed the data as listed    Component Value Date/Time   NA 139 04/21/2022 1537   NA 140 01/02/2016 1034   K 3.7 04/21/2022 1537   K 4.0 01/02/2016 1034   CL 102 04/21/2022 1537   CO2 29 04/21/2022 1537   CO2 27 01/02/2016 1034   GLUCOSE 102 (H) 04/21/2022 1537   GLUCOSE 99 01/02/2016 1034   BUN 14 04/21/2022 1537   BUN 9.0 01/02/2016 1034   CREATININE 0.83 04/21/2022 1537   CREATININE 0.8 01/02/2016 1034   CALCIUM 9.7 04/21/2022 1537   CALCIUM 9.6 01/02/2016 1034   PROT 7.6 04/21/2022 1537   PROT 7.8 01/02/2016 1034   ALBUMIN 4.4 04/21/2022 1537   ALBUMIN 4.1 01/02/2016 1034   AST 14 (L) 04/21/2022 1537   AST 17 01/02/2016 1034   ALT 16 04/21/2022 1537   ALT 17 01/02/2016 1034   ALKPHOS 57 04/21/2022 1537   ALKPHOS 76 01/02/2016 1034   BILITOT 0.5 04/21/2022 1537   BILITOT 0.45 01/02/2016 1034   GFRNONAA >60 04/21/2022 1537   GFRAA >60 04/22/2020 1308    No results found for: "SPEP", "UPEP"  Lab Results  Component Value Date   WBC 6.1 04/21/2022   NEUTROABS 1.8 04/21/2022   HGB 13.6 04/21/2022   HCT 39.8 04/21/2022   MCV 90.0 04/21/2022   PLT 332 04/21/2022      Chemistry      Component Value Date/Time   NA 139 04/21/2022 1537   NA 140 01/02/2016 1034   K 3.7 04/21/2022 1537   K 4.0 01/02/2016 1034    CL 102 04/21/2022 1537   CO2 29 04/21/2022 1537   CO2 27 01/02/2016 1034   BUN 14 04/21/2022 1537   BUN 9.0 01/02/2016 1034   CREATININE 0.83 04/21/2022 1537   CREATININE 0.8 01/02/2016 1034      Component Value Date/Time   CALCIUM 9.7 04/21/2022 1537   CALCIUM 9.6 01/02/2016 1034   ALKPHOS 57 04/21/2022 1537   ALKPHOS 76 01/02/2016 1034   AST 14 (L) 04/21/2022 1537   AST 17 01/02/2016 1034   ALT 16 04/21/2022 1537   ALT 17 01/02/2016 1034   BILITOT 0.5 04/21/2022 1537   BILITOT 0.45 01/02/2016 1034       RADIOGRAPHIC STUDIES: I have reviewed imaging study with the patient I have personally reviewed the radiological images as listed and agreed with the findings in the report. CT CHEST LUNG CA SCREEN LOW DOSE W/O CM  Result Date: 04/23/2022 CLINICAL DATA:  Greater than 30 pack-year history EXAM: CT CHEST WITHOUT CONTRAST LOW-DOSE FOR LUNG CANCER SCREENING TECHNIQUE: Multidetector CT imaging of the chest was performed following the standard protocol without IV contrast. RADIATION DOSE REDUCTION: This exam was performed according to the departmental dose-optimization program which includes automated exposure control, adjustment of the mA and/or kV according to patient size and/or use of iterative reconstruction technique. COMPARISON:  Cancer screening CT dated May 01, 2021 FINDINGS: Cardiovascular: Normal heart size. No pericardial effusion. Moderate coronary artery calcifications of the LAD. Normal caliber thoracic aorta with mild-to-moderate calcific plaque. Mediastinum/Nodes: Thyroid and esophagus are unremarkable. No pathologically enlarged lymph nodes seen in the chest. Lungs/Pleura: Central airways are patent. Mild centrilobular emphysema. No consolidation, pleural effusion or pneumothorax. Numerous new solid pulmonary nodules. Largest is located in the right upper lobe measuring 4.6 mm on image 77. Other previously seen pulmonary nodules are stable. Upper Abdomen: Low-attenuation  right adrenal gland nodule measuring 2.6 cm, unchanged compared to prior exam and compatible with benign adenoma. No acute abnormality. Musculoskeletal: ACDF of the cervical spine. No chest wall mass or suspicious bone lesions identified. IMPRESSION: 1. Numerous new small solid pulmonary nodules, largest measures 4.6 mm, likely infectious. Lung-RADS 3, probably benign findings. Short-term follow-up in 6 months is recommended with repeat low-dose chest CT without contrast (please use the following order, CT CHEST LCS NODULE FOLLOW-UP W/O CM). 2. Moderate coronary artery calcifications of the LAD. 3. Aortic Atherosclerosis (ICD10-I70.0) and Emphysema (ICD10-J43.9). Electronically Signed   By: Yetta Glassman M.D.   On: 04/23/2022 10:54   MM 3D SCREEN BREAST BILATERAL  Result Date: 04/22/2022 CLINICAL DATA:  Screening. EXAM: DIGITAL SCREENING BILATERAL MAMMOGRAM WITH TOMOSYNTHESIS AND CAD TECHNIQUE: Bilateral screening digital craniocaudal and mediolateral oblique mammograms were obtained. Bilateral screening digital breast tomosynthesis was performed. The images were evaluated with computer-aided detection. COMPARISON:  Previous exam(s). ACR Breast Density Category c: The breast tissue is heterogeneously dense, which may obscure small masses. FINDINGS: There are no findings suspicious for malignancy. IMPRESSION: No mammographic evidence of malignancy. A result letter of this screening mammogram will be mailed directly to the patient. RECOMMENDATION: Screening mammogram in one year. (Code:SM-B-01Y) BI-RADS CATEGORY  1: Negative. Electronically Signed   By: Abelardo Diesel M.D.   On: 04/22/2022 11:47

## 2022-04-23 NOTE — Assessment & Plan Note (Signed)
The patient is at high risk for cancer due to ongoing smoking Due to findings of lung nodules, I will refer her to pulmonologist for evaluation We discussed importance of nicotine cessation

## 2022-04-24 DIAGNOSIS — B351 Tinea unguium: Secondary | ICD-10-CM

## 2022-05-08 ENCOUNTER — Ambulatory Visit: Payer: Medicare Other | Admitting: Podiatry

## 2022-05-20 ENCOUNTER — Ambulatory Visit (INDEPENDENT_AMBULATORY_CARE_PROVIDER_SITE_OTHER): Payer: Medicare Other

## 2022-05-20 ENCOUNTER — Ambulatory Visit (INDEPENDENT_AMBULATORY_CARE_PROVIDER_SITE_OTHER): Payer: Medicare Other | Admitting: Internal Medicine

## 2022-05-20 ENCOUNTER — Encounter: Payer: Self-pay | Admitting: Internal Medicine

## 2022-05-20 VITALS — BP 134/76 | HR 79 | Temp 98.3°F | Ht 66.0 in | Wt 152.0 lb

## 2022-05-20 DIAGNOSIS — R0781 Pleurodynia: Secondary | ICD-10-CM | POA: Diagnosis not present

## 2022-05-20 DIAGNOSIS — I10 Essential (primary) hypertension: Secondary | ICD-10-CM

## 2022-05-20 DIAGNOSIS — J22 Unspecified acute lower respiratory infection: Secondary | ICD-10-CM | POA: Diagnosis not present

## 2022-05-20 DIAGNOSIS — R058 Other specified cough: Secondary | ICD-10-CM

## 2022-05-20 DIAGNOSIS — E118 Type 2 diabetes mellitus with unspecified complications: Secondary | ICD-10-CM | POA: Diagnosis not present

## 2022-05-20 DIAGNOSIS — E785 Hyperlipidemia, unspecified: Secondary | ICD-10-CM

## 2022-05-20 DIAGNOSIS — R0609 Other forms of dyspnea: Secondary | ICD-10-CM | POA: Diagnosis not present

## 2022-05-20 DIAGNOSIS — R0600 Dyspnea, unspecified: Secondary | ICD-10-CM | POA: Diagnosis not present

## 2022-05-20 DIAGNOSIS — R059 Cough, unspecified: Secondary | ICD-10-CM | POA: Diagnosis not present

## 2022-05-20 DIAGNOSIS — Z0001 Encounter for general adult medical examination with abnormal findings: Secondary | ICD-10-CM

## 2022-05-20 DIAGNOSIS — R079 Chest pain, unspecified: Secondary | ICD-10-CM | POA: Diagnosis not present

## 2022-05-20 LAB — LIPID PANEL
Cholesterol: 164 mg/dL (ref 0–200)
HDL: 58.6 mg/dL (ref >39.00)
LDL Cholesterol: 93 mg/dL (ref 0–99)
NonHDL: 105.47
Total CHOL/HDL Ratio: 3
Triglycerides: 64 mg/dL (ref 0.0–149.0)
VLDL: 12.8 mg/dL (ref 0.0–40.0)

## 2022-05-20 LAB — URINALYSIS, ROUTINE W REFLEX MICROSCOPIC
Bilirubin Urine: NEGATIVE
Hgb urine dipstick: NEGATIVE
Ketones, ur: NEGATIVE
Leukocytes,Ua: NEGATIVE
Nitrite: NEGATIVE
Specific Gravity, Urine: 1.02 (ref 1.000–1.030)
Total Protein, Urine: NEGATIVE
Urine Glucose: NEGATIVE
Urobilinogen, UA: 1 (ref 0.0–1.0)
pH: 7 (ref 5.0–8.0)

## 2022-05-20 LAB — HEMOGLOBIN A1C: Hgb A1c MFr Bld: 6.9 % — ABNORMAL HIGH (ref 4.6–6.5)

## 2022-05-20 LAB — MICROALBUMIN / CREATININE URINE RATIO
Creatinine,U: 191.8 mg/dL
Microalb Creat Ratio: 0.6 mg/g (ref 0.0–30.0)
Microalb, Ur: 1.2 mg/dL (ref 0.0–1.9)

## 2022-05-20 LAB — BRAIN NATRIURETIC PEPTIDE: Pro B Natriuretic peptide (BNP): 9 pg/mL (ref 0.0–100.0)

## 2022-05-20 LAB — D-DIMER, QUANTITATIVE: D-Dimer, Quant: 0.35 mcg/mL FEU (ref <0.50)

## 2022-05-20 LAB — TROPONIN I (HIGH SENSITIVITY): High Sens Troponin I: 3 ng/L (ref 2–17)

## 2022-05-20 LAB — TSH: TSH: 0.7 u[IU]/mL (ref 0.35–5.50)

## 2022-05-20 MED ORDER — ROSUVASTATIN CALCIUM 20 MG PO TABS: ORAL_TABLET | ORAL | 1 refills | Status: DC

## 2022-05-20 MED ORDER — AMOXICILLIN-POT CLAVULANATE 875-125 MG PO TABS: 1.0000 | ORAL_TABLET | Freq: Two times a day (BID) | ORAL | 0 refills | Status: AC

## 2022-05-20 NOTE — Progress Notes (Signed)
Subjective:  Patient ID: Lauren Orozco, female    DOB: Dec 10, 1955  Age: 66 y.o. MRN: [id number]  CC: Annual Exam, Cough, and Diabetes   HPI Lauren Orozco presents for   She complains of a several week history of burning sensation in her chest, cough productive of thick yellow phlegm, shortness of breath, and chest tightness.  She can walk 2 to 3 miles a day with good endurance without experiencing chest pain or dyspnea on exertion.  She recently underwent a CT of the chest that raise concern for multiple nodules that are either inflammatory or infectious.  Outpatient Medications Prior to Visit  Medication Sig Dispense Refill   Accu-Chek Softclix Lancets lancets Use to check blood sugar daily. DX: E11.9 100 each 3   acetaminophen (TYLENOL) 500 MG tablet Take 500 mg by mouth every 6 (six) hours as needed.     Blood Glucose Monitoring Suppl (ACCU-CHEK GUIDE ME) w/Device KIT 1 Act by Does not apply route in the morning and at bedtime. 1 kit 0   Cholecalciferol 50 MCG (2000 UT) TABS Take 2 tablets (4,000 Units total) by mouth daily. 180 tablet 1   glucose blood (ACCU-CHEK GUIDE) test strip Use to check blood sugar daily. DX: E11.9 100 each 3   indapamide (LOZOL) 1.25 MG tablet TAKE 1 TABLET BY MOUTH EVERY DAY 90 tablet 1   rosuvastatin (CRESTOR) 20 MG tablet TAKE 1 TABLET(20 MG) BY MOUTH DAILY WITH BREAKFAST 90 tablet 1   No facility-administered medications prior to visit.    ROS Review of Systems  Constitutional:  Positive for unexpected weight change. Negative for chills, diaphoresis and fever.  HENT:  Positive for voice change. Negative for sore throat and trouble swallowing.   Eyes: Negative.   Respiratory:  Positive for cough, chest tightness and shortness of breath. Negative for wheezing.   Cardiovascular:  Positive for chest pain. Negative for palpitations and leg swelling.  Gastrointestinal:  Negative for abdominal pain, constipation, diarrhea, nausea and  vomiting.  Endocrine: Negative.   Genitourinary: Negative.  Negative for difficulty urinating.  Musculoskeletal: Negative.   Skin: Negative.   Neurological: Negative.  Negative for dizziness, weakness and light-headedness.  Hematological:  Negative for adenopathy. Does not bruise/bleed easily.  Psychiatric/Behavioral: Negative.      Objective:  BP 134/76 (BP Location: Right Arm, Patient Position: Sitting, Cuff Size: Large)   Pulse 79   Temp 98.3 F (36.8 C) (Oral)   Ht _0  (1.676 m)   Wt 152 lb (68.9 kg)   SpO2 94%   BMI 24.53 kg/m   BP Readings from Last 3 Encounters:  05/20/22 134/76  04/23/22 (!) 147/71  08/18/21 128/76    Wt Readings from Last 3 Encounters:  05/20/22 152 lb (68.9 kg)  04/23/22 153 lb 3.2 oz (69.5 kg)  03/23/22 154 lb (69.9 kg)    Physical Exam Vitals reviewed.  Constitutional:      General: She is not in acute distress.    Appearance: She is not ill-appearing or toxic-appearing.  HENT:     Mouth/Throat:     Mouth: Mucous membranes are moist.  Eyes:     General: No scleral icterus.    Conjunctiva/sclera: Conjunctivae normal.  Cardiovascular:     Rate and Rhythm: Normal rate and regular rhythm.     Heart sounds: No murmur heard.    Comments: EKG- NSR, 64 bpm No ST/T wave changes or Q waves Normal EKG Pulmonary:     Effort: Pulmonary effort is  normal.     Breath sounds: No stridor. No wheezing, rhonchi or rales.  Abdominal:     General: Abdomen is flat.     Palpations: There is no mass.     Tenderness: There is no abdominal tenderness. There is no guarding.     Hernia: No hernia is present.  Musculoskeletal:        General: Normal range of motion.     Cervical back: Neck supple.     Right lower leg: No edema.     Left lower leg: No edema.  Lymphadenopathy:     Cervical: No cervical adenopathy.  Skin:    General: Skin is warm and dry.  Neurological:     General: No focal deficit present.     Mental Status: She is alert. Mental  status is at baseline.  Psychiatric:        Mood and Affect: Mood normal.        Behavior: Behavior normal.        Thought Content: Thought content normal.     Lab Results  Component Value Date   WBC 6.1 04/21/2022   HGB 13.6 04/21/2022   HCT 39.8 04/21/2022   PLT 332 04/21/2022   GLUCOSE 102 (H) 04/21/2022   CHOL 164 05/20/2022   TRIG 64.0 05/20/2022   HDL 58.60 05/20/2022   LDLDIRECT 150.8 10/19/2011   LDLCALC 93 05/20/2022   ALT 16 04/21/2022   AST 14 (L) 04/21/2022   NA 139 04/21/2022   K 3.7 04/21/2022   CL 102 04/21/2022   CREATININE 0.83 04/21/2022   BUN 14 04/21/2022   CO2 29 04/21/2022   TSH 0.70 05/20/2022   HGBA1C 6.9 (H) 05/20/2022   MICROALBUR 1.2 05/20/2022    CT CHEST LUNG CA SCREEN LOW DOSE W/O CM  Result Date: 04/23/2022 CLINICAL DATA:  Greater than 30 pack-year history EXAM: CT CHEST WITHOUT CONTRAST LOW-DOSE FOR LUNG CANCER SCREENING TECHNIQUE: Multidetector CT imaging of the chest was performed following the standard protocol without IV contrast. RADIATION DOSE REDUCTION: This exam was performed according to the departmental dose-optimization program which includes automated exposure control, adjustment of the mA and/or kV according to patient size and/or use of iterative reconstruction technique. COMPARISON:  Cancer screening CT dated May 01, 2021 FINDINGS: Cardiovascular: Normal heart size. No pericardial effusion. Moderate coronary artery calcifications of the LAD. Normal caliber thoracic aorta with mild-to-moderate calcific plaque. Mediastinum/Nodes: Thyroid and esophagus are unremarkable. No pathologically enlarged lymph nodes seen in the chest. Lungs/Pleura: Central airways are patent. Mild centrilobular emphysema. No consolidation, pleural effusion or pneumothorax. Numerous new solid pulmonary nodules. Largest is located in the right upper lobe measuring 4.6 mm on image 77. Other previously seen pulmonary nodules are stable. Upper Abdomen:  Low-attenuation right adrenal gland nodule measuring 2.6 cm, unchanged compared to prior exam and compatible with benign adenoma. No acute abnormality. Musculoskeletal: ACDF of the cervical spine. No chest wall mass or suspicious bone lesions identified. IMPRESSION: 1. Numerous new small solid pulmonary nodules, largest measures 4.6 mm, likely infectious. Lung-RADS 3, probably benign findings. Short-term follow-up in 6 months is recommended with repeat low-dose chest CT without contrast (please use the following order, CT CHEST LCS NODULE FOLLOW-UP W/O CM). 2. Moderate coronary artery calcifications of the LAD. 3. Aortic Atherosclerosis (ICD10-I70.0) and Emphysema (ICD10-J43.9). Electronically Signed   By: Yetta Glassman M.D.   On: 04/23/2022 10:54   MM 3D SCREEN BREAST BILATERAL  Result Date: 04/22/2022 CLINICAL DATA:  Screening. EXAM: DIGITAL SCREENING BILATERAL  MAMMOGRAM WITH TOMOSYNTHESIS AND CAD TECHNIQUE: Bilateral screening digital craniocaudal and mediolateral oblique mammograms were obtained. Bilateral screening digital breast tomosynthesis was performed. The images were evaluated with computer-aided detection. COMPARISON:  Previous exam(s). ACR Breast Density Category c: The breast tissue is heterogeneously dense, which may obscure small masses. FINDINGS: There are no findings suspicious for malignancy. IMPRESSION: No mammographic evidence of malignancy. A result letter of this screening mammogram will be mailed directly to the patient. RECOMMENDATION: Screening mammogram in one year. (Code:SM-B-01Y) BI-RADS CATEGORY  1: Negative. Electronically Signed   By: Abelardo Diesel M.D.   On: 04/22/2022 11:47   DG Chest 2 View  Result Date: 05/20/2022 CLINICAL DATA:  Cough and dyspnea on exertion with chest pain for 2 weeks. History of emphysema. EXAM: CHEST - 2 VIEW COMPARISON:  Chest radiograph 04/11/2020, CT chest 04/23/2022 FINDINGS: The cardiomediastinal silhouette is normal. There is no focal  consolidation or pulmonary edema. There is no pleural effusion or pneumothorax There is no acute osseous abnormality. Cervical spine fusion hardware is noted. IMPRESSION: No radiographic evidence of acute cardiopulmonary process. Electronically Signed   By: Valetta Mole M.D.   On: 05/20/2022 10:42      Assessment & Plan:   Lauren Orozco was seen today for annual exam, cough and diabetes.  Diagnoses and all orders for this visit:  Essential hypertension- Her blood pressure is well controlled. -     TSH; Future -     Urinalysis, Routine w reflex microscopic; Future -     Microalbumin / creatinine urine ratio; Future -     EKG 12-Lead -     Microalbumin / creatinine urine ratio -     Urinalysis, Routine w reflex microscopic -     TSH  Type II diabetes mellitus with manifestations ([place])- Her blood sugar is well controlled. -     Hemoglobin A1c; Future -     Hemoglobin A1c  Hyperlipidemia with target LDL less than 130- LDL goal achieved. Doing well on the statin  -     Lipid panel; Future -     TSH; Future -     TSH -     Lipid panel -     rosuvastatin (CRESTOR) 20 MG tablet; TAKE 1 TABLET(20 MG) BY MOUTH DAILY WITH BREAKFAST  Encounter for general adult medical examination with abnormal findings- Exam completed, labs reviewed, vaccines reviewed and updated, cancer screenings are up-to-date, patient education was given.  Cough productive of yellow sputum- Chest x-ray today is reassuring.  Will treat for bacterial lower respiratory tract infection. -     DG Chest 2 View; Future  DOE (dyspnea on exertion)- Her EKG and labs are reassuring.  I think this is related to lung disease. -     Troponin I (High Sensitivity); Future -     D-dimer, quantitative; Future -     Brain natriuretic peptide; Future -     EKG 12-Lead -     Brain natriuretic peptide -     D-dimer, quantitative -     Troponin I (High Sensitivity)  Pleurodynia- Evaluation for secondary causes is negative.  Will treat the  respiratory tract infection. -     Troponin I (High Sensitivity); Future -     D-dimer, quantitative; Future -     Brain natriuretic peptide; Future -     Brain natriuretic peptide -     D-dimer, quantitative -     Troponin I (High Sensitivity)  LRTI (lower respiratory tract infection) -  amoxicillin-clavulanate (AUGMENTIN) 875-125 MG tablet; Take 1 tablet by mouth 2 (two) times daily for 10 days.   I am having Lauren Orozco. Lauren Orozco start on amoxicillin-clavulanate. I am also having her maintain her acetaminophen, Accu-Chek Guide Me, Accu-Chek Softclix Lancets, Accu-Chek Guide, Cholecalciferol, indapamide, and rosuvastatin.  Meds ordered this encounter  Medications   amoxicillin-clavulanate (AUGMENTIN) 875-125 MG tablet    Sig: Take 1 tablet by mouth 2 (two) times daily for 10 days.    Dispense:  20 tablet    Refill:  0   rosuvastatin (CRESTOR) 20 MG tablet    Sig: TAKE 1 TABLET(20 MG) BY MOUTH DAILY WITH BREAKFAST    Dispense:  90 tablet    Refill:  1   In addition to time spent on CPE, I spent 50 minutes in preparing to see the patient by review of recent labs and images, obtaining and reviewing separately obtained history, communicating with the patient, ordering medications, tests or procedures, and documenting clinical information in the EHR including the differential Dx, treatment, and any further evaluation and management of multiple complex medical issues.     Follow-up: Return in about 3 months (around 08/20/2022).  Scarlette Calico, MD

## 2022-05-20 NOTE — Patient Instructions (Signed)

## 2022-05-21 ENCOUNTER — Institutional Professional Consult (permissible substitution): Payer: Medicare Other | Admitting: Pulmonary Disease

## 2022-05-22 MED ORDER — SHINGRIX 50 MCG/0.5ML IM SUSR: 0.5000 mL | Freq: Once | INTRAMUSCULAR | 1 refills | Status: AC

## 2022-06-18 ENCOUNTER — Ambulatory Visit: Payer: Medicare Other | Admitting: Pulmonary Disease

## 2022-06-18 ENCOUNTER — Encounter: Payer: Self-pay | Admitting: Pulmonary Disease

## 2022-06-18 VITALS — BP 130/80 | HR 79 | Ht 66.25 in | Wt 155.4 lb

## 2022-06-18 DIAGNOSIS — Z8572 Personal history of non-Hodgkin lymphomas: Secondary | ICD-10-CM

## 2022-06-18 DIAGNOSIS — Z720 Tobacco use: Secondary | ICD-10-CM

## 2022-06-18 DIAGNOSIS — R918 Other nonspecific abnormal finding of lung field: Secondary | ICD-10-CM | POA: Diagnosis not present

## 2022-06-18 MED ORDER — BUPROPION HCL ER (SR) 150 MG PO TB12: 150.0000 mg | ORAL_TABLET | Freq: Two times a day (BID) | ORAL | 3 refills | Status: DC

## 2022-06-18 NOTE — Progress Notes (Signed)
Synopsis: Referred in September 2023 for pulmonary nodules by Heath Lark, MD  Subjective:   PATIENT ID: Lauren Orozco GENDER: female DOB: [birth year], MRN: [id number]  Chief Complaint  Patient presents with   Consult    Lung nodules    This is a 66 year old female, history of non-Hodgkin's lymphoma, hypertension, longstanding history of tobacco use.  Received lung cancer screening CT in July 2023.  This showed small multiple scattered pulmonary nodules.  Respiratory standpoint she is doing fine.  She is still smoking.  She does want help with quitting smoking.  She has not used any medications in the past.  She has heard of the use of Wellbutrin and is interested in doing that if possible.  She does understand that she needs to quit but she does have difficulty on her own.  Never been told that she had COPD.    Past Medical History:  Diagnosis Date   Anxiety    Chronic neck pain    with radiculopathy   Chronic pain of left wrist 2009   "since neck surgery"   Complication of anesthesia    Hard to wake up per pt; after neck sx had to be bagged.   DEPRESSION    DISC DISEASE, CERVICAL    Diverticulitis    HYPERLIPIDEMIA    Hypertension    Lymphocytosis 01/02/2016   NEPHROLITHIASIS, HX OF    NHL (non-Hodgkin's lymphoma) ([place]) 12/25/2014   Recurrent abdominal pain    Umbilical hernia    UTI'S, CHRONIC      Family History  Problem Relation Age of Onset   Hypertension Mother    Diabetes Mother    Hyperlipidemia Mother    Heart attack Father        MI   Hyperlipidemia Father    Hypertension Father    Diabetes Father    Heart disease Father    Cancer Neg Hx        Negative for Breast or Colon Cancer   Colon cancer Neg Hx      Past Surgical History:  Procedure Laterality Date   ABDOMINAL HYSTERECTOMY  2001   APPENDECTOMY  1981   KIDNEY STONE SURGERY  2007   LEFT OOPHORECTOMY  2001   [facility]   NECK SURGERY  2009   fusion of C1, C2, C3    Social  History   Socioeconomic History   Marital status: Single    Spouse name: Not on file   Number of children: 1   Years of education: HSG   Highest education level: Not on file  Occupational History   Occupation: Disabled  Tobacco Use   Smoking status: Former    Packs/day: 0.90    Years: 30.00    Total pack years: 27.00    Types: Cigarettes    Quit date: 12/18/2019    Years since quitting: 2.5   Smokeless tobacco: Never  Vaping Use   Vaping Use: Never used  Substance and Sexual Activity   Alcohol use: No   Drug use: No   Sexual activity: Not Currently  Other Topics Concern   Not on file  Social History Narrative   HSG, [employer]-CNA (in process). [place]. Daughter in '91 [place]. Work-disability due to neck and arthritis problems. Lives-alone. No history of physical or sexual abuse   Social Determinants of Health   Financial Resource Strain: Low Risk  (03/23/2022)   Overall Financial Resource Strain (CARDIA)    Difficulty of Paying Living Expenses:  Not hard at all  Food Insecurity: No Food Insecurity (03/23/2022)   Hunger Vital Sign    Worried About Running Out of Food in the Last Year: Never true    Ran Out of Food in the Last Year: Never true  Transportation Needs: No Transportation Needs (03/23/2022)   PRAPARE - Hydrologist (Medical): No    Lack of Transportation (Non-Medical): No  Physical Activity: Sufficiently Active (03/23/2022)   Exercise Vital Sign    Days of Exercise per Week: 3 days    Minutes of Exercise per Session: 50 min  Stress: No Stress Concern Present (03/23/2022)   [place]    Feeling of Stress : Not at all  Social Connections: Moderately Integrated (03/23/2022)   Social Connection and Isolation Panel [NHANES]    Frequency of Communication with Friends and Family: More than three times a week    Frequency of Social Gatherings with Friends and Family:  More than three times a week    Attends Religious Services: More than 4 times per year    Active Member of [employer] or Organizations: Yes    Attends Music therapist: More than 4 times per year    Marital Status: Never married  Intimate Partner Violence: Not At Risk (03/23/2022)   Humiliation, Afraid, Rape, and Kick questionnaire    Fear of Current or Ex-Partner: No    Emotionally Abused: No    Physically Abused: No    Sexually Abused: No     Allergies  Allergen Reactions   Ciprofloxacin Hives    All floxacin drugs Burn-like hives   Ofloxacin Hives    Burn like hives   Other Other (See Comments)    All NUTS and corn due to diverticulitis     Outpatient Medications Prior to Visit  Medication Sig Dispense Refill   Accu-Chek Softclix Lancets lancets Use to check blood sugar daily. DX: E11.9 100 each 3   acetaminophen (TYLENOL) 500 MG tablet Take 500 mg by mouth every 6 (six) hours as needed.     Blood Glucose Monitoring Suppl (ACCU-CHEK GUIDE ME) w/Device KIT 1 Act by Does not apply route in the morning and at bedtime. 1 kit 0   Cholecalciferol 50 MCG (2000 UT) TABS Take 2 tablets (4,000 Units total) by mouth daily. 180 tablet 1   glucose blood (ACCU-CHEK GUIDE) test strip Use to check blood sugar daily. DX: E11.9 100 each 3   indapamide (LOZOL) 1.25 MG tablet TAKE 1 TABLET BY MOUTH EVERY DAY 90 tablet 1   rosuvastatin (CRESTOR) 20 MG tablet TAKE 1 TABLET(20 MG) BY MOUTH DAILY WITH BREAKFAST 90 tablet 1   No facility-administered medications prior to visit.    Review of Systems  Constitutional:  Negative for chills, fever, malaise/fatigue and weight loss.  HENT:  Negative for hearing loss, sore throat and tinnitus.   Eyes:  Negative for blurred vision and double vision.  Respiratory:  Negative for cough, hemoptysis, sputum production, shortness of breath, wheezing and stridor.   Cardiovascular:  Negative for chest pain, palpitations, orthopnea, leg swelling and PND.   Gastrointestinal:  Negative for abdominal pain, constipation, diarrhea, heartburn, nausea and vomiting.  Genitourinary:  Negative for dysuria, hematuria and urgency.  Musculoskeletal:  Negative for joint pain and myalgias.  Skin:  Negative for itching and rash.  Neurological:  Negative for dizziness, tingling, weakness and headaches.  Endo/Heme/Allergies:  Negative for environmental allergies. Does not bruise/bleed  easily.  Psychiatric/Behavioral:  Negative for depression. The patient is not nervous/anxious and does not have insomnia.   All other systems reviewed and are negative.    Objective:  Physical Exam Vitals reviewed.  Constitutional:      General: She is not in acute distress.    Appearance: She is well-developed.  HENT:     Head: Normocephalic and atraumatic.  Eyes:     General: No scleral icterus.    Conjunctiva/sclera: Conjunctivae normal.     Pupils: Pupils are equal, round, and reactive to light.  Neck:     Vascular: No JVD.     Trachea: No tracheal deviation.  Cardiovascular:     Rate and Rhythm: Normal rate and regular rhythm.     Heart sounds: Normal heart sounds. No murmur heard. Pulmonary:     Effort: Pulmonary effort is normal. No tachypnea, accessory muscle usage or respiratory distress.     Breath sounds: No stridor. No wheezing, rhonchi or rales.  Abdominal:     General: There is no distension.     Palpations: Abdomen is soft.     Tenderness: There is no abdominal tenderness.  Musculoskeletal:        General: No tenderness.     Cervical back: Neck supple.  Lymphadenopathy:     Cervical: No cervical adenopathy.  Skin:    General: Skin is warm and dry.     Capillary Refill: Capillary refill takes less than 2 seconds.     Findings: No rash.  Neurological:     Mental Status: She is alert and oriented to person, place, and time.  Psychiatric:        Behavior: Behavior normal.      Vitals:   06/18/22 1142  BP: 130/80  Pulse: 79  SpO2: 96%   Weight: 155 lb 6.4 oz (70.5 kg)  Height: 5' 6.25" (1.683 m)   96% on RA BMI Readings from Last 3 Encounters:  06/18/22 24.89 kg/m  05/20/22 24.53 kg/m  04/23/22 24.73 kg/m   Wt Readings from Last 3 Encounters:  06/18/22 155 lb 6.4 oz (70.5 kg)  05/20/22 152 lb (68.9 kg)  04/23/22 153 lb 3.2 oz (69.5 kg)     CBC    Component Value Date/Time   WBC 6.1 04/21/2022 1537   RBC 4.42 04/21/2022 1537   HGB 13.6 04/21/2022 1537   HGB 12.8 01/01/2017 1036   HCT 39.8 04/21/2022 1537   HCT 37.2 01/01/2017 1036   PLT 332 04/21/2022 1537   PLT 360 01/01/2017 1036   MCV 90.0 04/21/2022 1537   MCV 87.3 01/01/2017 1036   MCH 30.8 04/21/2022 1537   MCHC 34.2 04/21/2022 1537   RDW 13.3 04/21/2022 1537   RDW 13.4 01/01/2017 1036   LYMPHSABS 3.7 04/21/2022 1537   LYMPHSABS 4.2 (H) 01/01/2017 1036   MONOABS 0.5 04/21/2022 1537   MONOABS 0.6 01/01/2017 1036   EOSABS 0.1 04/21/2022 1537   EOSABS 0.1 01/01/2017 1036   BASOSABS 0.0 04/21/2022 1537   BASOSABS 0.0 01/01/2017 1036     Chest Imaging: July 2023 lung cancer screening CT: Numerous small pulmonary nodules scattered throughout the chest. The patient's images have been independently reviewed by me.    Pulmonary Functions Testing Results:     No data to display          FeNO:   Pathology:   Echocardiogram:   Heart Catheterization:     Assessment & Plan:     ICD-10-CM   1. Multiple  pulmonary nodules  R91.8     2. History of lymphoma  Z85.72     3. Tobacco use  Z72.0       Discussion:  This is a 66 year old female, multiple pulmonary nodules found incidentally on a lung cancer screening CT, all are small and subcentimeter in size.  She does have a history of lymphoma.  She is longstanding tobacco abuse history.  Plan: She needs repeat noncontrasted CT scan of the chest in 6 months. I have ordered this. We will have her follow-up with [place] after that. She was counseled today on smoking cessation. We  will start her on Wellbutrin SR, 150 mg twice daily. Start with 150 mg once a day for 3 days and then uptitrate to twice daily. We talked about tapering method for the use of her tobacco.  Hopefully she can start with a set number of cigarettes per day and decrease to 1 or 2 cigarettes/day each week. She is going to work on this over the coming months hopefully she will be able to stop soon. RTC in 6 months after CT complete.   Current Outpatient Medications:    buPROPion (WELLBUTRIN SR) 150 MG 12 hr tablet, Take 1 tablet (150 mg total) by mouth 2 (two) times daily., Disp: 60 tablet, Rfl: 3   Accu-Chek Softclix Lancets lancets, Use to check blood sugar daily. DX: E11.9, Disp: 100 each, Rfl: 3   acetaminophen (TYLENOL) 500 MG tablet, Take 500 mg by mouth every 6 (six) hours as needed., Disp: , Rfl:    Blood Glucose Monitoring Suppl (ACCU-CHEK GUIDE ME) w/Device KIT, 1 Act by Does not apply route in the morning and at bedtime., Disp: 1 kit, Rfl: 0   Cholecalciferol 50 MCG (2000 UT) TABS, Take 2 tablets (4,000 Units total) by mouth daily., Disp: 180 tablet, Rfl: 1   glucose blood (ACCU-CHEK GUIDE) test strip, Use to check blood sugar daily. DX: E11.9, Disp: 100 each, Rfl: 3   indapamide (LOZOL) 1.25 MG tablet, TAKE 1 TABLET BY MOUTH EVERY DAY, Disp: 90 tablet, Rfl: 1   rosuvastatin (CRESTOR) 20 MG tablet, TAKE 1 TABLET(20 MG) BY MOUTH DAILY WITH BREAKFAST, Disp: 90 tablet, Rfl: 1    Garner Nash, DO  Pulmonary Critical Care 06/18/2022 12:03 PM

## 2022-06-18 NOTE — Patient Instructions (Signed)
Thank you for visiting Dr. Valeta Harms at [facility]. Today we recommend the following: Orders Placed This Encounter  Procedures   CT Super D Chest Wo Contrast   Meds ordered this encounter  Medications   buPROPion (WELLBUTRIN SR) 150 MG 12 hr tablet    Sig: Take 1 tablet (150 mg total) by mouth 2 (two) times daily.    Dispense:  60 tablet    Refill:  3   Return in about 6 months (around 12/17/2022).    Please do your part to reduce the spread of COVID-19.   You must quit smoking or vaping. This is the single most important thing that you can do to improve your lung health.   S = Set a quit date. T = Tell family, friends, and the people around you that you plan to quit. A = Anticipate or plan ahead for the tough times you'll face while quitting. R = Remove cigarettes and other tobacco products from your home, car, and work T = Talk to [place] about getting help to quit  If you need help feel free to reach out to our office, [place] Smoking Cessation Class: [phone], call 1-800-QUIT-NOW, or visit [email or web address]

## 2022-08-11 ENCOUNTER — Encounter: Payer: Self-pay | Admitting: Family Medicine

## 2022-08-11 ENCOUNTER — Ambulatory Visit (INDEPENDENT_AMBULATORY_CARE_PROVIDER_SITE_OTHER): Payer: Medicare Other | Admitting: Family Medicine

## 2022-08-11 VITALS — BP 110/82 | HR 67 | Temp 97.6°F | Ht 66.25 in | Wt 154.0 lb

## 2022-08-11 DIAGNOSIS — F17200 Nicotine dependence, unspecified, uncomplicated: Secondary | ICD-10-CM

## 2022-08-11 DIAGNOSIS — R0981 Nasal congestion: Secondary | ICD-10-CM

## 2022-08-11 DIAGNOSIS — R058 Other specified cough: Secondary | ICD-10-CM | POA: Diagnosis not present

## 2022-08-11 DIAGNOSIS — R062 Wheezing: Secondary | ICD-10-CM

## 2022-08-11 DIAGNOSIS — J029 Acute pharyngitis, unspecified: Secondary | ICD-10-CM | POA: Diagnosis not present

## 2022-08-11 DIAGNOSIS — R490 Dysphonia: Secondary | ICD-10-CM | POA: Diagnosis not present

## 2022-08-11 LAB — POC COVID19 BINAXNOW: SARS Coronavirus 2 Ag: NEGATIVE

## 2022-08-11 MED ORDER — PREDNISONE 20 MG PO TABS: 40.0000 mg | ORAL_TABLET | Freq: Every day | ORAL | 0 refills | Status: DC

## 2022-08-11 MED ORDER — AMOXICILLIN-POT CLAVULANATE 875-125 MG PO TABS: 1.0000 | ORAL_TABLET | Freq: Two times a day (BID) | ORAL | 0 refills | Status: DC

## 2022-08-11 NOTE — Progress Notes (Signed)
Subjective:  Lauren Orozco is a 66 y.o. female who presents for a 4 day hx of sore throat, swollen lymph nodes in her neck and coughing. Cough is productive and shortness of breath. Smokes.   Exposed to a lot of people over the weekend and a sick family member.  Hoarseness worsening.  Taking Tylenol 500 mg and using salt water gargles.   Denies fever, dizziness, chest pain, palpitations, abdominal pain, N/V/D.   No other aggravating or relieving factors.  No other c/o.  ROS as in subjective.   Objective: Vitals:   08/11/22 1021  BP: 110/82  Pulse: 67  Temp: 97.6 F (36.4 C)  SpO2: 99%    General appearance: Alert, WD/WN, no distress, mildly ill appearing                             Skin: warm, no rash                           Head: no sinus tenderness                            Eyes: conjunctiva normal, corneas clear, PERRLA                            Ears: pearly TMs, external ear canals normal                          Nose: septum midline, turbinates swollen, with erythema and clear discharge             Mouth/throat: MMM, tongue normal, mild pharyngeal erythema                           Neck: supple, no adenopathy, no thyromegaly, nontender                          Heart: RRR                         Lungs: mild expiratory wheezes      Assessment: Productive cough - Plan: amoxicillin-clavulanate (AUGMENTIN) 875-125 MG tablet, predniSONE (DELTASONE) 20 MG tablet  Acute pharyngitis, unspecified etiology  Nasal congestion - Plan: POC COVID-19  Hoarseness - Plan: POC COVID-19  Sore throat - Plan: POC COVID-19  Wheezing - Plan: predniSONE (DELTASONE) 20 MG tablet  Smoker   Plan: Covid test negative.  Augmentin and oral prednisone prescribed. Counseling on potential side effects.  Suggested symptomatic OTC remedies.  Call/return if worsening or not back to baseline in the next 2 weeks.

## 2022-08-11 NOTE — Patient Instructions (Signed)
Take the antibiotic and oral steroids as prescribed.   Take these with a meal and water. Be aware the steroids may increase your blood sugars and interfere with your sleeping.   Follow up if worsening or not back to baseline in the next week or two.

## 2022-08-29 ENCOUNTER — Encounter (HOSPITAL_COMMUNITY): Payer: Self-pay

## 2022-08-29 ENCOUNTER — Emergency Department (HOSPITAL_COMMUNITY)
Admission: EM | Admit: 2022-08-29 | Discharge: 2022-08-29 | Disposition: A | Discharge status: Home / Self Care | Payer: Medicare Other | Attending: Emergency Medicine | Admitting: Emergency Medicine

## 2022-08-29 ENCOUNTER — Emergency Department (HOSPITAL_COMMUNITY): Payer: Medicare Other

## 2022-08-29 ENCOUNTER — Other Ambulatory Visit: Payer: Self-pay

## 2022-08-29 DIAGNOSIS — R0602 Shortness of breath: Secondary | ICD-10-CM | POA: Diagnosis not present

## 2022-08-29 DIAGNOSIS — U071 COVID-19: Secondary | ICD-10-CM | POA: Diagnosis not present

## 2022-08-29 DIAGNOSIS — R509 Fever, unspecified: Secondary | ICD-10-CM | POA: Diagnosis present

## 2022-08-29 DIAGNOSIS — R059 Cough, unspecified: Secondary | ICD-10-CM | POA: Diagnosis not present

## 2022-08-29 DIAGNOSIS — R6883 Chills (without fever): Secondary | ICD-10-CM | POA: Diagnosis not present

## 2022-08-29 LAB — RESP PANEL BY RT-PCR (FLU A&B, COVID) ARPGX2
Influenza A by PCR: NEGATIVE
Influenza B by PCR: NEGATIVE
SARS Coronavirus 2 by RT PCR: POSITIVE — AB

## 2022-08-29 MED ORDER — PROMETHAZINE-DM 6.25-15 MG/5ML PO SYRP: 5.0000 mL | ORAL_SOLUTION | Freq: Four times a day (QID) | ORAL | 0 refills | Status: DC | PRN

## 2022-08-29 MED ORDER — NIRMATRELVIR/RITONAVIR (PAXLOVID)TABLET: 3.0000 | ORAL_TABLET | Freq: Two times a day (BID) | ORAL | 0 refills | Status: AC

## 2022-08-29 MED ORDER — ALBUTEROL SULFATE HFA 108 (90 BASE) MCG/ACT IN AERS
2.0000 | INHALATION_SPRAY | Freq: Once | RESPIRATORY_TRACT | Status: AC
  Administered 2022-08-29: 2 via RESPIRATORY_TRACT
  Filled 2022-08-29: qty 6.7

## 2022-08-29 NOTE — Discharge Instructions (Addendum)
You have COVID-19 and I have prescribed Paxlovid for you.  As we discussed, you need to isolate for 5 days and you can be around others with mask for another 5 days.  I have prescribed some cough medicine for you as well.  Stay hydrated  Use albuterol every 4 hours as needed for cough as well.  See your doctor for follow up   Return to ER if you have worse trouble breathing, fever, wheezing     Person Under Monitoring Name: Lauren Orozco  Location: [address] [facility] [id number]   Infection Prevention Recommendations for Individuals Confirmed to have, or Being Evaluated for, 2019 Novel Coronavirus (COVID-19) Infection Who Receive Care at Home  Individuals who are confirmed to have, or are being evaluated for, COVID-19 should follow the prevention steps below until a healthcare provider or local or state health department says they can return to normal activities.  Stay home except to get medical care You should restrict activities outside your home, except for getting medical care. Do not go to work, school, or public areas, and do not use public transportation or taxis.  Call ahead before visiting your doctor Before your medical appointment, call the healthcare provider and tell them that you have, or are being evaluated for, COVID-19 infection. This will help the healthcare provider's office take steps to keep other people from getting infected. Ask your healthcare provider to call the local or state health department.  Monitor your symptoms Seek prompt medical attention if your illness is worsening (e.g., difficulty breathing). Before going to your medical appointment, call the healthcare provider and tell them that you have, or are being evaluated for, COVID-19 infection. Ask your healthcare provider to call the local or state health department.  Wear a facemask You should wear a facemask that covers your nose and mouth when you are in the same room  with other people and when you visit a healthcare provider. People who live with or visit you should also wear a facemask while they are in the same room with you.  Separate yourself from other people in your home As much as possible, you should stay in a different room from other people in your home. Also, you should use a separate bathroom, if available.  Avoid sharing household items You should not share dishes, drinking glasses, cups, eating utensils, towels, bedding, or other items with other people in your home. After using these items, you should wash them thoroughly with soap and water.  Cover your coughs and sneezes Cover your mouth and nose with a tissue when you cough or sneeze, or you can cough or sneeze into your sleeve. Throw used tissues in a lined trash can, and immediately wash your hands with soap and water for at least 20 seconds or use an alcohol-based hand rub.  Wash your [facility] your hands often and thoroughly with soap and water for at least 20 seconds. You can use an alcohol-based hand sanitizer if soap and water are not available and if your hands are not visibly dirty. Avoid touching your eyes, nose, and mouth with unwashed hands.   Prevention Steps for Caregivers and Household Members of Individuals Confirmed to have, or Being Evaluated for, COVID-19 Infection Being Cared for in the Home  If you live with, or provide care at home for, a person confirmed to have, or being evaluated for, COVID-19 infection please follow these guidelines to prevent infection:  Follow healthcare provider's instructions Make sure that  you understand and can help the patient follow any healthcare provider instructions for all care.  Provide for the patient's basic needs You should help the patient with basic needs in the home and provide support for getting groceries, prescriptions, and other personal needs.  Monitor the patient's symptoms If they are getting sicker, call  his or her medical provider and tell them that the patient has, or is being evaluated for, COVID-19 infection. This will help the healthcare provider's office take steps to keep other people from getting infected. Ask the healthcare provider to call the local or state health department.  Limit the number of people who have contact with the patient If possible, have only one caregiver for the patient. Other household members should stay in another home or place of residence. If this is not possible, they should stay in another room, or be separated from the patient as much as possible. Use a separate bathroom, if available. Restrict visitors who do not have an essential need to be in the home.  Keep older adults, very young children, and other sick people away from the patient Keep older adults, very young children, and those who have compromised immune systems or chronic health conditions away from the patient. This includes people with chronic heart, lung, or kidney conditions, diabetes, and cancer.  Ensure good ventilation Make sure that shared spaces in the home have good air flow, such as from an air conditioner or an opened window, weather permitting.  Wash your hands often Wash your hands often and thoroughly with soap and water for at least 20 seconds. You can use an alcohol based hand sanitizer if soap and water are not available and if your hands are not visibly dirty. Avoid touching your eyes, nose, and mouth with unwashed hands. Use disposable paper towels to dry your hands. If not available, use dedicated cloth towels and replace them when they become wet.  Wear a facemask and gloves Wear a disposable facemask at all times in the room and gloves when you touch or have contact with the patient's blood, body fluids, and/or secretions or excretions, such as sweat, saliva, sputum, nasal mucus, vomit, urine, or feces.  Ensure the mask fits over your nose and mouth tightly, and do not  touch it during use. Throw out disposable facemasks and gloves after using them. Do not reuse. Wash your hands immediately after removing your facemask and gloves. If your personal clothing becomes contaminated, carefully remove clothing and launder. Wash your hands after handling contaminated clothing. Place all used disposable facemasks, gloves, and other waste in a lined container before disposing them with other household waste. Remove gloves and wash your hands immediately after handling these items.  Do not share dishes, glasses, or other household items with the patient Avoid sharing household items. You should not share dishes, drinking glasses, cups, eating utensils, towels, bedding, or other items with a patient who is confirmed to have, or being evaluated for, COVID-19 infection. After the person uses these items, you should wash them thoroughly with soap and water.  Wash laundry thoroughly Immediately remove and wash clothes or bedding that have blood, body fluids, and/or secretions or excretions, such as sweat, saliva, sputum, nasal mucus, vomit, urine, or feces, on them. Wear gloves when handling laundry from the patient. Read and follow directions on labels of laundry or clothing items and detergent. In general, wash and dry with the warmest temperatures recommended on the label.  Clean all areas the individual has used  often Clean all touchable surfaces, such as counters, tabletops, doorknobs, bathroom fixtures, toilets, phones, keyboards, tablets, and bedside tables, every day. Also, clean any surfaces that may have blood, body fluids, and/or secretions or excretions on them. Wear gloves when cleaning surfaces the patient has come in contact with. Use a diluted bleach solution (e.g., dilute bleach with 1 part bleach and 10 parts water) or a household disinfectant with a label that says EPA-registered for coronaviruses. To make a bleach solution at home, add 1 tablespoon of bleach  to 1 quart (4 cups) of water. For a larger supply, add  cup of bleach to 1 gallon (16 cups) of water. Read labels of cleaning products and follow recommendations provided on product labels. Labels contain instructions for safe and effective use of the cleaning product including precautions you should take when applying the product, such as wearing gloves or eye protection and making sure you have good ventilation during use of the product. Remove gloves and wash hands immediately after cleaning.  Monitor yourself for signs and symptoms of illness Caregivers and household members are considered close contacts, should monitor their health, and will be asked to limit movement outside of the home to the extent possible. Follow the monitoring steps for close contacts listed on the symptom monitoring form.   ? If you have additional questions, contact your local health department or call the epidemiologist on call at [phone] (available 24/7). ? This guidance is subject to change. For the most up-to-date guidance from [facility], please refer to their website: [email or web address]

## 2022-08-29 NOTE — ED Triage Notes (Signed)
Patient c/o chills, body aches, a productive cough with thick beige sputum, and a fever x 1 month. Patient has seen her PCP 2 weeks ago. Patient states symptoms worsened 2 days ago.  Patient states her sister tested Covid+ 3 days ago and they were together.

## 2022-08-29 NOTE — ED Provider Notes (Signed)
[facility] DEPT Provider Note   CSN: [id number] Arrival date & time: 08/29/22  1234     History  Chief Complaint  Patient presents with   Fever   Generalized Body Aches   Cough   Chills    Delmi Fulfer is a 66 y.o. female history of non-Hodgkin's lymphoma, here presenting with fever and body aches and cough and chills.  Patient states that she has been coughing for the last month or so.  Patient saw PCP about 2 weeks ago and was prescribed a course of Augmentin and prednisone.  She states that it did not help with her symptoms.  She also was tested for COVID at that time and was negative.  She states that over Thanksgiving, she was with her sister and her sister just tested positive for COVID 2 days ago.  Patient has worsening cough.  She was concerned that she may have pneumonia.   The history is provided by the patient.       Home Medications Prior to Admission medications   Medication Sig Start Date End Date Taking? Authorizing Provider  Accu-Chek Softclix Lancets lancets Use to check blood sugar daily. DX: E11.9 04/26/20   Janith Lima, MD  acetaminophen (TYLENOL) 500 MG tablet Take 500 mg by mouth every 6 (six) hours as needed.    [provider]  amoxicillin-clavulanate (AUGMENTIN) 875-125 MG tablet Take 1 tablet by mouth 2 (two) times daily. 08/11/22   Henson, Vickie L, NP-C  Blood Glucose Monitoring Suppl (ACCU-CHEK GUIDE ME) w/Device KIT 1 Act by Does not apply route in the morning and at bedtime. 02/08/20   Janith Lima, MD  Cholecalciferol 50 MCG (2000 UT) TABS Take 2 tablets (4,000 Units total) by mouth daily. 12/24/20   Janith Lima, MD  ELDERBERRY PO Take by mouth.    [provider]  glucose blood (ACCU-CHEK GUIDE) test strip Use to check blood sugar daily. DX: E11.9 04/26/20   Janith Lima, MD  indapamide (LOZOL) 1.25 MG tablet TAKE 1 TABLET BY MOUTH EVERY DAY 02/27/22   Janith Lima, MD   predniSONE (DELTASONE) 20 MG tablet Take 2 tablets (40 mg total) by mouth daily with breakfast. 08/11/22   Henson, Vickie L, NP-C  rosuvastatin (CRESTOR) 20 MG tablet TAKE 1 TABLET(20 MG) BY MOUTH DAILY WITH BREAKFAST Patient not taking: Reported on 08/11/2022 05/20/22   Janith Lima, MD      Allergies    Ciprofloxacin, Ofloxacin, and Other    Review of Systems   Review of Systems  Constitutional:  Positive for fever.  Respiratory:  Positive for cough.   All other systems reviewed and are negative.   Physical Exam Updated Vital Signs BP (!) 136/90   Pulse 80   Temp 99.8 F (37.7 C) (Oral)   Resp 18   Ht 5' 6.25" (1.683 m)   Wt 71.2 kg   SpO2 97%   BMI 25.15 kg/m  Physical Exam Vitals and nursing note reviewed.  Constitutional:      Comments: Chronically ill, mild cough  HENT:     Head: Normocephalic.     Nose: Nose normal.     Mouth/Throat:     Mouth: Mucous membranes are dry.  Eyes:     Extraocular Movements: Extraocular movements intact.     Pupils: Pupils are equal, round, and reactive to light.  Cardiovascular:     Rate and Rhythm: Normal rate and regular rhythm.  Pulses: Normal pulses.     Heart sounds: Normal heart sounds.  Pulmonary:     Comments: Slightly tachypneic and diminished bilaterally. Abdominal:     General: Abdomen is flat.     Palpations: Abdomen is soft.  Musculoskeletal:        General: Normal range of motion.     Cervical back: Normal range of motion and neck supple.  Skin:    General: Skin is warm.     Capillary Refill: Capillary refill takes less than 2 seconds.  Neurological:     General: No focal deficit present.     Mental Status: She is alert and oriented to person, place, and time.  Psychiatric:        Mood and Affect: Mood normal.        Behavior: Behavior normal.     ED Results / Procedures / Treatments   Labs (all labs ordered are listed, but only abnormal results are displayed) Labs Reviewed  SARS CORONAVIRUS 2  BY RT PCR    EKG None  Radiology DG Chest 2 View  Result Date: 08/29/2022 CLINICAL DATA:  Productive cough and chills EXAM: CHEST - 2 VIEW COMPARISON:  05/20/2022 FINDINGS: The heart size and mediastinal contours are within normal limits. Both lungs are clear. Disc degenerative disease of the thoracic spine. IMPRESSION: No acute abnormality of the lungs. Electronically Signed   By: Delanna Ahmadi M.D.   On: 08/29/2022 14:04    Procedures Procedures    Medications Ordered in ED Medications  albuterol (VENTOLIN HFA) 108 (90 Base) MCG/ACT inhaler 2 puff (has no administration in time range)    ED Course/ Medical Decision Making/ A&P Clinical Course as of 08/29/22 1744  Sat Aug 29, 2022  1602 DG Chest 2 View [HS]    Clinical Course User Index [HS] Sherrill Raring, [place]                           Medical Decision Making Danelle Curiale Martha is a 66 y.o. female here presenting with shortness of breath and cough.  This been going on for several weeks but got worse 2 days ago.  Patient was recently exposed to [place].  Consider COVID versus flu versus other viral etiology versus pneumonia.  Plan to get chest x-ray and COVID and flu test.   8:07 PM I reviewed patient's COVID test and is positive.  Patient's flu test is negative.  Chest x-ray showed no pneumonia.  Patient has no oxygen requirement.  Patient had kidney function checked in July of this year and it was normal.  GFR is greater than 60.  Patient qualifies for the regular dose of Paxlovid.  She also request cough medicine and I prescribed her some cough medicine as well.  Gave strict return precautions.  Amount and/or Complexity of Data Reviewed Labs: ordered. Decision-making details documented in ED Course. Radiology: ordered and independent interpretation performed.  Risk Prescription drug management.    Final Clinical Impression(s) / ED Diagnoses Final diagnoses:  None    Rx / DC Orders ED Discharge Orders     None          Drenda Freeze, MD 08/29/22 2008

## 2022-08-29 NOTE — ED Provider Triage Note (Addendum)
Emergency Medicine Provider Triage Evaluation Note  Lauren Orozco , a 66 y.o. female  was evaluated in triage.  Pt complains of cough for 1 month.  Pt has been on 2 different antibiotics and prednisone . Pt request a chest xray.    Review of Systems  Positive: fever Negative: Abdominal pain  Physical Exam  BP (!) 136/90   Pulse 80   Temp 99.8 F (37.7 C) (Oral)   Resp 18   SpO2 97%  Gen:   Awake, no distress   Resp:  Normal effort, harsh breath sounds  MSK:   Moves extremities without difficulty  Other:    Medical Decision Making  Medically screening exam initiated at 1:43 PM.  Appropriate orders placed.  Lauren Orozco was informed that the remainder of the evaluation will be completed by another provider, this initial triage assessment does not replace that evaluation, and the importance of remaining in the ED until their evaluation is complete.     Fransico Meadow, PA-C 08/29/22 1345    Fransico Meadow, [place] 08/29/22 1346

## 2022-08-29 NOTE — ED Notes (Signed)
Patient verbalized understanding of discharge instructions and reasons to return to the ED.  She understands the isolation and precautions to follow as well.

## 2022-09-01 ENCOUNTER — Encounter: Payer: Self-pay | Admitting: Internal Medicine

## 2022-09-15 ENCOUNTER — Ambulatory Visit: Payer: Medicare Other | Admitting: Internal Medicine

## 2022-09-18 ENCOUNTER — Other Ambulatory Visit: Payer: Medicare Other

## 2022-10-13 ENCOUNTER — Ambulatory Visit (HOSPITAL_BASED_OUTPATIENT_CLINIC_OR_DEPARTMENT_OTHER): Payer: Medicare Other

## 2022-10-15 ENCOUNTER — Ambulatory Visit (HOSPITAL_BASED_OUTPATIENT_CLINIC_OR_DEPARTMENT_OTHER)
Admission: RE | Admit: 2022-10-15 | Discharge: 2022-10-15 | Disposition: A | Discharge status: Home / Self Care | Payer: Medicare Other | Source: Ambulatory Visit | Attending: Pulmonary Disease | Admitting: Pulmonary Disease

## 2022-10-15 DIAGNOSIS — J439 Emphysema, unspecified: Secondary | ICD-10-CM | POA: Diagnosis not present

## 2022-10-15 DIAGNOSIS — R918 Other nonspecific abnormal finding of lung field: Secondary | ICD-10-CM | POA: Insufficient documentation

## 2022-10-19 NOTE — Progress Notes (Signed)
Tay,    Please let patient know that the follow-up CT imaging shows resolution of the previous lung nodules.  She can return to low-dose lung cancer screening CT program in 1 year.  Thanks,  BLI  Garner Nash, DO [facility] 10/19/2022 2:30 PM

## 2022-10-20 NOTE — Progress Notes (Signed)
Received a message from [facility]  with Dr. Juline Patch office. Asking for Dr. Alvy Bimler preference on ordering future CT's. Per Dr. Alvy Bimler, she prefers that Dr. Valeta Harms order CT's and follows.

## 2022-10-22 ENCOUNTER — Telehealth: Payer: Self-pay | Admitting: Pulmonary Disease

## 2022-10-23 NOTE — Telephone Encounter (Signed)
Called pt about CT results. Patient verbalized understanding. Nothing further needed.

## 2022-11-10 ENCOUNTER — Other Ambulatory Visit: Payer: Self-pay | Admitting: Internal Medicine

## 2022-11-10 DIAGNOSIS — I10 Essential (primary) hypertension: Secondary | ICD-10-CM

## 2022-11-17 ENCOUNTER — Encounter: Payer: Self-pay | Admitting: Internal Medicine

## 2023-01-19 ENCOUNTER — Ambulatory Visit (INDEPENDENT_AMBULATORY_CARE_PROVIDER_SITE_OTHER): Payer: Medicare Other | Admitting: Internal Medicine

## 2023-01-19 ENCOUNTER — Encounter: Payer: Self-pay | Admitting: Internal Medicine

## 2023-01-19 VITALS — BP 142/82 | HR 82 | Temp 98.1°F | Ht 66.25 in | Wt 149.0 lb

## 2023-01-19 DIAGNOSIS — C8590 Non-Hodgkin lymphoma, unspecified, unspecified site: Secondary | ICD-10-CM | POA: Diagnosis not present

## 2023-01-19 DIAGNOSIS — N182 Chronic kidney disease, stage 2 (mild): Secondary | ICD-10-CM

## 2023-01-19 DIAGNOSIS — I10 Essential (primary) hypertension: Secondary | ICD-10-CM | POA: Diagnosis not present

## 2023-01-19 DIAGNOSIS — E785 Hyperlipidemia, unspecified: Secondary | ICD-10-CM

## 2023-01-19 DIAGNOSIS — E118 Type 2 diabetes mellitus with unspecified complications: Secondary | ICD-10-CM | POA: Diagnosis not present

## 2023-01-19 DIAGNOSIS — R079 Chest pain, unspecified: Secondary | ICD-10-CM

## 2023-01-19 DIAGNOSIS — E1122 Type 2 diabetes mellitus with diabetic chronic kidney disease: Secondary | ICD-10-CM

## 2023-01-19 DIAGNOSIS — Z7984 Long term (current) use of oral hypoglycemic drugs: Secondary | ICD-10-CM | POA: Diagnosis not present

## 2023-01-19 DIAGNOSIS — R0609 Other forms of dyspnea: Secondary | ICD-10-CM

## 2023-01-19 LAB — CBC WITH DIFFERENTIAL/PLATELET
Basophils Absolute: 0.1 10*3/uL (ref 0.0–0.1)
Basophils Relative: 1.1 % (ref 0.0–3.0)
Eosinophils Absolute: 0.1 10*3/uL (ref 0.0–0.7)
Eosinophils Relative: 0.8 % (ref 0.0–5.0)
HCT: 38.7 % (ref 36.0–46.0)
Hemoglobin: 13.4 g/dL (ref 12.0–15.0)
Lymphocytes Relative: 60.6 % — ABNORMAL HIGH (ref 12.0–46.0)
Lymphs Abs: 3.9 10*3/uL (ref 0.7–4.0)
MCHC: 34.5 g/dL (ref 30.0–36.0)
MCV: 90.3 fl (ref 78.0–100.0)
Monocytes Absolute: 0.5 10*3/uL (ref 0.1–1.0)
Monocytes Relative: 7.7 % (ref 3.0–12.0)
Neutro Abs: 1.9 10*3/uL (ref 1.4–7.7)
Neutrophils Relative %: 29.8 % — ABNORMAL LOW (ref 43.0–77.0)
Platelets: 395 10*3/uL (ref 150.0–400.0)
RBC: 4.29 Mil/uL (ref 3.87–5.11)
RDW: 13.5 % (ref 11.5–15.5)
WBC: 6.5 10*3/uL (ref 4.0–10.5)

## 2023-01-19 LAB — HEPATIC FUNCTION PANEL
ALT: 10 U/L (ref 0–35)
AST: 13 U/L (ref 0–37)
Albumin: 4.6 g/dL (ref 3.5–5.2)
Alkaline Phosphatase: 66 U/L (ref 39–117)
Bilirubin, Direct: 0.1 mg/dL (ref 0.0–0.3)
Total Bilirubin: 0.3 mg/dL (ref 0.2–1.2)
Total Protein: 7.5 g/dL (ref 6.0–8.3)

## 2023-01-19 LAB — BASIC METABOLIC PANEL
BUN: 14 mg/dL (ref 6–23)
CO2: 31 mEq/L (ref 19–32)
Calcium: 9.6 mg/dL (ref 8.4–10.5)
Chloride: 100 mEq/L (ref 96–112)
Creatinine, Ser: 0.84 mg/dL (ref 0.40–1.20)
GFR: 72.35 mL/min (ref >60.00)
Glucose, Bld: 94 mg/dL (ref 70–99)
Potassium: 3.7 mEq/L (ref 3.5–5.1)
Sodium: 138 mEq/L (ref 135–145)

## 2023-01-19 LAB — D-DIMER, QUANTITATIVE: D-Dimer, Quant: 0.31 mcg/mL FEU (ref <0.50)

## 2023-01-19 LAB — TROPONIN I (HIGH SENSITIVITY): High Sens Troponin I: 5 ng/L (ref 2–17)

## 2023-01-19 LAB — HEMOGLOBIN A1C: Hgb A1c MFr Bld: 6.5 % (ref 4.6–6.5)

## 2023-01-19 LAB — BRAIN NATRIURETIC PEPTIDE: Pro B Natriuretic peptide (BNP): 16 pg/mL (ref 0.0–100.0)

## 2023-01-19 NOTE — Progress Notes (Signed)
Subjective:  Patient ID: Lauren Orozco, female    DOB: [birth year]  Age: 67 y.o. MRN: [id number]  CC: Cough, Hypertension, and Diabetes   HPI Kareem Aul Miotke presents for f/up --  She complains of a 34-month history of chest pain that occurs during the night.  She uses a heating pad and says it eases the pain.  She describes the pain as a stabbing sensation.  She has chronic unchanged cough and shortness of breath.  She denies hemoptysis, wheezing, or diaphoresis.  Outpatient Medications Prior to Visit  Medication Sig Dispense Refill   Accu-Chek Softclix Lancets lancets Use to check blood sugar daily. DX: E11.9 100 each 3   Blood Glucose Monitoring Suppl (ACCU-CHEK GUIDE ME) w/Device KIT 1 Act by Does not apply route in the morning and at bedtime. 1 kit 0   Cholecalciferol 50 MCG (2000 UT) TABS Take 2 tablets (4,000 Units total) by mouth daily. 180 tablet 1   glucose blood (ACCU-CHEK GUIDE) test strip Use to check blood sugar daily. DX: E11.9 100 each 3   indapamide (LOZOL) 1.25 MG tablet TAKE 1 TABLET BY MOUTH EVERY DAY 90 tablet 1   acetaminophen (TYLENOL) 500 MG tablet Take 500 mg by mouth every 6 (six) hours as needed.     ELDERBERRY PO Take by mouth.     predniSONE (DELTASONE) 20 MG tablet Take 2 tablets (40 mg total) by mouth daily with breakfast. 10 tablet 0   rosuvastatin (CRESTOR) 20 MG tablet TAKE 1 TABLET(20 MG) BY MOUTH DAILY WITH BREAKFAST 90 tablet 1   amoxicillin-clavulanate (AUGMENTIN) 875-125 MG tablet Take 1 tablet by mouth 2 (two) times daily. 20 tablet 0   promethazine-dextromethorphan (PROMETHAZINE-DM) 6.25-15 MG/5ML syrup Take 5 mLs by mouth 4 (four) times daily as needed for cough. 118 mL 0   No facility-administered medications prior to visit.    ROS Review of Systems  Constitutional:  Positive for unexpected weight change (wt loss). Negative for chills, diaphoresis, fatigue and fever.  HENT:  Negative for sore throat and trouble  swallowing.   Respiratory:  Positive for cough and shortness of breath. Negative for choking, chest tightness, wheezing and stridor.   Cardiovascular:  Positive for chest pain. Negative for palpitations and leg swelling.  Gastrointestinal:  Negative for abdominal pain, constipation, diarrhea, nausea and vomiting.  Genitourinary: Negative.  Negative for difficulty urinating.  Musculoskeletal: Negative.  Negative for arthralgias.  Skin: Negative.   Neurological:  Negative for dizziness, weakness and light-headedness.  Hematological:  Negative for adenopathy. Does not bruise/bleed easily.  Psychiatric/Behavioral: Negative.      Objective:  BP (!) 142/82 (BP Location: Left Arm, Patient Position: Sitting, Cuff Size: Normal)   Pulse 82   Temp 98.1 F (36.7 C) (Oral)   Ht 5' 6.25" (1.683 m)   Wt 149 lb (67.6 kg)   SpO2 96%   BMI 23.87 kg/m   BP Readings from Last 3 Encounters:  01/19/23 (!) 142/82  08/29/22 133/73  08/11/22 110/82    Wt Readings from Last 3 Encounters:  01/19/23 149 lb (67.6 kg)  08/29/22 157 lb (71.2 kg)  08/11/22 154 lb (69.9 kg)    Physical Exam Vitals reviewed.  HENT:     Nose: Nose normal.     Mouth/Throat:     Mouth: Mucous membranes are moist.  Eyes:     General: No scleral icterus.    Conjunctiva/sclera: Conjunctivae normal.  Cardiovascular:     Rate and Rhythm: Normal rate  and regular rhythm.     Heart sounds: Normal heart sounds, S1 normal and S2 normal. No murmur heard.    Comments: EKG-  NSR with PAC's, 76 bpm No ST/T wave changes, LVH, or Q waves Pulmonary:     Breath sounds: No stridor. No wheezing, rhonchi or rales.  Abdominal:     General: Abdomen is flat.     Palpations: There is no mass.     Tenderness: There is no abdominal tenderness. There is no guarding.     Hernia: No hernia is present.  Musculoskeletal:     Cervical back: Neck supple.     Right lower leg: No edema.     Left lower leg: No edema.  Lymphadenopathy:      Cervical: No cervical adenopathy.  Skin:    General: Skin is warm and dry.     Coloration: Skin is not pale.  Neurological:     General: No focal deficit present.     Mental Status: She is alert. Mental status is at baseline.  Psychiatric:        Mood and Affect: Mood normal.        Behavior: Behavior normal.     Lab Results  Component Value Date   WBC 6.5 01/19/2023   HGB 13.4 01/19/2023   HCT 38.7 01/19/2023   PLT 395.0 01/19/2023   GLUCOSE 94 01/19/2023   CHOL 164 05/20/2022   TRIG 64.0 05/20/2022   HDL 58.60 05/20/2022   LDLDIRECT 150.8 10/19/2011   LDLCALC 93 05/20/2022   ALT 10 01/19/2023   AST 13 01/19/2023   NA 138 01/19/2023   K 3.7 01/19/2023   CL 100 01/19/2023   CREATININE 0.84 01/19/2023   BUN 14 01/19/2023   CO2 31 01/19/2023   TSH 0.70 05/20/2022   HGBA1C 6.5 01/19/2023   MICROALBUR 1.2 05/20/2022    CT Super D Chest Wo Contrast  Result Date: 10/16/2022 CLINICAL DATA:  Follow-up nodules EXAM: CT CHEST WITHOUT CONTRAST TECHNIQUE: Multidetector CT imaging of the chest was performed using thin slice collimation for electromagnetic bronchoscopy planning purposes, without intravenous contrast. RADIATION DOSE REDUCTION: This exam was performed according to the departmental dose-optimization program which includes automated exposure control, adjustment of the mA and/or kV according to patient size and/or use of iterative reconstruction technique. COMPARISON:  Lung cancer screening CT dated April 21, 2022 FINDINGS: Cardiovascular: Normal heart size. No pericardial effusion. Normal caliber thoracic aorta with moderate calcified plaque. Moderate coronary artery calcifications. Mediastinum/Nodes: Moderate coronary artery calcifications. Esophagus and thyroid are unremarkable no pathologically enlarged lymph nodes seen in the chest. Lungs/Pleura: Central airways are patent. No consolidation, pleural effusion or pneumothorax. Mild centrilobular emphysema. Pulmonary nodules  which were new on prior lung cancer screening CT have resolved. Other previously seen small solid pulmonary nodules are stable. Reference solid pulmonary nodule of the left lower lobe measuring 4 mm on series 4, image 105. Upper Abdomen: Stable benign right adenoma, no specific follow-up imaging is recommended. Musculoskeletal: No chest wall mass or suspicious bone lesions identified. IMPRESSION: 1. Pulmonary nodules which were new on prior lung cancer screening CT have resolved. Other previously seen small solid pulmonary nodules are stable. Recommend return to annual lung cancer screening. 2. Coronary artery calcifications, aortic Atherosclerosis (ICD10-I70.0) and Emphysema (ICD10-J43.9). Electronically Signed   By: Allegra Lai M.D.   On: 10/16/2022 11:27    Assessment & Plan:   Type II diabetes mellitus with manifestations- Her blood sugar is adequately well-controlled. -  Basic metabolic panel; Future -     Hepatic function panel; Future -     Hemoglobin A1c; Future -     HM Diabetes Foot Exam -     Urinalysis, Routine w reflex microscopic; Future -     Empagliflozin; Take 1 tablet (10 mg total) by mouth daily.  Dispense: 90 tablet; Refill: 1  Essential hypertension- Her blood pressure is not at goal.  I encouraged her to quit smoking. -     Basic metabolic panel; Future -     CBC with Differential/Platelet; Future -     Hepatic function panel; Future -     EKG 12-Lead -     Urinalysis, Routine w reflex microscopic; Future  Low grade lymphoma, stage I (HCC)- There is no evidence of recurrence. -     CBC with Differential/Platelet; Future -     Hepatic function panel; Future  Chest pain at rest- Her EKG and labs are reassuring.  Her chest pain is atypical but she is high risk for coronary artery disease.  I recommended that she undergo a coronary calcium score. -     D-dimer, quantitative; Future -     Troponin I (High Sensitivity); Future -     Brain natriuretic peptide;  Future -     CT CARDIAC SCORING (SELF PAY ONLY); Future  DOE (dyspnea on exertion) -     D-dimer, quantitative; Future -     Troponin I (High Sensitivity); Future -     Brain natriuretic peptide; Future -     CT CARDIAC SCORING (SELF PAY ONLY); Future  CKD stage 2 due to type 2 diabetes mellitus -     Empagliflozin; Take 1 tablet (10 mg total) by mouth daily.  Dispense: 90 tablet; Refill: 1  Hyperlipidemia with target LDL less than 130 -     Rosuvastatin Calcium; TAKE 1 TABLET(20 MG) BY MOUTH DAILY WITH BREAKFAST  Dispense: 90 tablet; Refill: 1     Follow-up: Return in about 3 months (around 04/20/2023).  Sanda Linger, MD

## 2023-01-19 NOTE — Patient Instructions (Signed)
Nonspecific Chest Pain, Adult Chest pain is an uncomfortable, tight, or painful feeling in the chest. The pain can feel like a crushing, aching, or squeezing pressure. A person can feel a burning or tingling sensation. Chest pain can also be felt in your back, neck, jaw, shoulder, or arm. This pain can be worse when you move, sneeze, or take a deep breath. Chest pain can be caused by a condition that is life-threatening. This must be treated right away. It can also be caused by something that is not life-threatening. If you have chest pain, it can be hard to know the difference, so it is important to get help right away to make sure that you do not have a serious condition. Some life-threatening causes of chest pain include: Heart attack. A tear in the body's main blood vessel (aortic dissection). Inflammation around your heart (pericarditis). A problem in the lungs, such as a blood clot (pulmonary embolism) or a collapsed lung (pneumothorax). Some non life-threatening causes of chest pain include: Heartburn. Anxiety or stress. Damage to the bones, muscles, and cartilage that make up your chest wall. Pneumonia or bronchitis. Shingles infection (varicella-zoster virus). Your chest pain may come and go. It may also be constant. Your health care provider will do tests and other studies to find the cause of your pain. Treatment will depend on the cause of your chest pain. Follow these instructions at home: Medicines Take over-the-counter and prescription medicines only as told by your health care provider. If you were prescribed an antibiotic medicine, take it as told by your health care provider. Do not stop taking the antibiotic even if you start to feel better. Activity Avoid any activities that cause chest pain. Do not lift anything that is heavier than 10 lb (4.5 kg), or the limit that you are told, until your health care provider says that it is safe. Rest as directed by your health care  provider. Return to your normal activities only as told by your health care provider. Ask your health care provider what activities are safe for you. Lifestyle     Do not use any products that contain nicotine or tobacco, such as cigarettes, e-cigarettes, and chewing tobacco. If you need help quitting, ask your health care provider. Do not drink alcohol. Make healthy lifestyle changes as recommended. These may include: Getting regular exercise. Ask your health care provider to suggest some exercises that are safe for you. Eating a heart-healthy diet. This includes plenty of fresh fruits and vegetables, whole grains, low-fat (lean) protein, and low-fat dairy products. A dietitian can help you find healthy eating options. Maintaining a healthy weight. Managing any other health conditions you may have, such as high blood pressure (hypertension) or diabetes. Reducing stress, such as with yoga or relaxation techniques. General instructions Pay attention to any changes in your symptoms. It is up to you to get the results of any tests that were done. Ask your health care provider, or the department that is doing the tests, when your results will be ready. Keep all follow-up visits as told by your health care provider. This is important. You may be asked to go for further testing if your chest pain does not go away. Contact a health care provider if: Your chest pain does not go away. You feel depressed. You have a fever. You notice changes in your symptoms or develop new symptoms. Get help right away if: Your chest pain gets worse. You have a cough that gets worse, or you   cough up blood. You have severe pain in your abdomen. You faint. You have sudden, unexplained chest discomfort. You have sudden, unexplained discomfort in your arms, back, neck, or jaw. You have shortness of breath at any time. You suddenly start to sweat, or your skin gets clammy. You feel nausea or you vomit. You  suddenly feel lightheaded or dizzy. You have severe weakness, or unexplained weakness or fatigue. Your heart begins to beat quickly, or it feels like it is skipping beats. These symptoms may represent a serious problem that is an emergency. Do not wait to see if the symptoms will go away. Get medical help right away. Call your local emergency services (911 in the [place] Do not drive yourself to the hospital. Summary Chest pain can be caused by a condition that is serious and requires urgent treatment. It may also be caused by something that is not life-threatening. Your health care provider may do lab tests and other studies to find the cause of your pain. Follow your health care provider's instructions on taking medicines, making lifestyle changes, and getting emergency treatment if symptoms become worse. Keep all follow-up visits as told by your health care provider. This includes visits for any further testing if your chest pain does not go away. This information is not intended to replace advice given to you by your health care provider. Make sure you discuss any questions you have with your health care provider. Document Revised: 08/06/2022 Document Reviewed: 08/06/2022 Elsevier Patient Education  2023 [employer]  

## 2023-01-20 DIAGNOSIS — N182 Chronic kidney disease, stage 2 (mild): Secondary | ICD-10-CM | POA: Insufficient documentation

## 2023-01-20 LAB — URINALYSIS, ROUTINE W REFLEX MICROSCOPIC
Bilirubin Urine: NEGATIVE
Hgb urine dipstick: NEGATIVE
Ketones, ur: NEGATIVE
Leukocytes,Ua: NEGATIVE
Nitrite: NEGATIVE
Specific Gravity, Urine: 1.02 (ref 1.000–1.030)
Total Protein, Urine: NEGATIVE
Urine Glucose: NEGATIVE
Urobilinogen, UA: 0.2 (ref 0.0–1.0)
pH: 7 (ref 5.0–8.0)

## 2023-01-20 MED ORDER — ROSUVASTATIN CALCIUM 20 MG PO TABS: ORAL_TABLET | ORAL | 1 refills | Status: DC

## 2023-01-20 MED ORDER — EMPAGLIFLOZIN 10 MG PO TABS: 10.0000 mg | ORAL_TABLET | Freq: Every day | ORAL | 1 refills | Status: DC

## 2023-01-22 ENCOUNTER — Encounter: Payer: Self-pay | Admitting: Internal Medicine

## 2023-02-23 ENCOUNTER — Other Ambulatory Visit: Payer: Medicare Other

## 2023-04-27 ENCOUNTER — Inpatient Hospital Stay: Payer: Medicare Other | Attending: Hematology and Oncology

## 2023-04-27 ENCOUNTER — Encounter: Payer: Self-pay | Admitting: Hematology and Oncology

## 2023-04-27 ENCOUNTER — Inpatient Hospital Stay (HOSPITAL_BASED_OUTPATIENT_CLINIC_OR_DEPARTMENT_OTHER): Payer: Medicare Other | Admitting: Hematology and Oncology

## 2023-04-27 ENCOUNTER — Other Ambulatory Visit: Payer: Self-pay

## 2023-04-27 VITALS — BP 149/76 | HR 76 | Temp 98.0°F | Resp 18 | Ht 66.25 in | Wt 157.8 lb

## 2023-04-27 DIAGNOSIS — Z720 Tobacco use: Secondary | ICD-10-CM | POA: Diagnosis not present

## 2023-04-27 DIAGNOSIS — R918 Other nonspecific abnormal finding of lung field: Secondary | ICD-10-CM | POA: Insufficient documentation

## 2023-04-27 DIAGNOSIS — C8590 Non-Hodgkin lymphoma, unspecified, unspecified site: Secondary | ICD-10-CM

## 2023-04-27 DIAGNOSIS — F17210 Nicotine dependence, cigarettes, uncomplicated: Secondary | ICD-10-CM | POA: Insufficient documentation

## 2023-04-27 DIAGNOSIS — Z8572 Personal history of non-Hodgkin lymphomas: Secondary | ICD-10-CM | POA: Insufficient documentation

## 2023-04-27 LAB — COMPREHENSIVE METABOLIC PANEL
ALT: 12 U/L (ref 0–44)
AST: 13 U/L — ABNORMAL LOW (ref 15–41)
Albumin: 4.4 g/dL (ref 3.5–5.0)
Alkaline Phosphatase: 67 U/L (ref 38–126)
Anion gap: 8 (ref 5–15)
BUN: 13 mg/dL (ref 8–23)
CO2: 28 mmol/L (ref 22–32)
Calcium: 9.8 mg/dL (ref 8.9–10.3)
Chloride: 103 mmol/L (ref 98–111)
Creatinine, Ser: 0.78 mg/dL (ref 0.44–1.00)
GFR, Estimated: >60 mL/min (ref >60)
Glucose, Bld: 106 mg/dL — ABNORMAL HIGH (ref 70–99)
Potassium: 3.7 mmol/L (ref 3.5–5.1)
Sodium: 139 mmol/L (ref 135–145)
Total Bilirubin: 0.4 mg/dL (ref 0.3–1.2)
Total Protein: 7.1 g/dL (ref 6.5–8.1)

## 2023-04-27 LAB — CBC WITH DIFFERENTIAL/PLATELET
Abs Immature Granulocytes: 0.01 10*3/uL (ref 0.00–0.07)
Basophils Absolute: 0 10*3/uL (ref 0.0–0.1)
Basophils Relative: 0 %
Eosinophils Absolute: 0.1 10*3/uL (ref 0.0–0.5)
Eosinophils Relative: 2 %
HCT: 38.9 % (ref 36.0–46.0)
Hemoglobin: 13.3 g/dL (ref 12.0–15.0)
Immature Granulocytes: 0 %
Lymphocytes Relative: 56 %
Lymphs Abs: 3.4 10*3/uL (ref 0.7–4.0)
MCH: 30.4 pg (ref 26.0–34.0)
MCHC: 34.2 g/dL (ref 30.0–36.0)
MCV: 88.8 fL (ref 80.0–100.0)
Monocytes Absolute: 0.7 10*3/uL (ref 0.1–1.0)
Monocytes Relative: 11 %
Neutro Abs: 1.9 10*3/uL (ref 1.7–7.7)
Neutrophils Relative %: 31 %
Platelets: 388 10*3/uL (ref 150–400)
RBC: 4.38 MIL/uL (ref 3.87–5.11)
RDW: 13.4 % (ref 11.5–15.5)
WBC: 6.1 10*3/uL (ref 4.0–10.5)
nRBC: 0 % (ref 0.0–0.2)

## 2023-04-27 NOTE — Assessment & Plan Note (Signed)
The patient is at high risk for cancer due to ongoing smoking We discussed importance of nicotine cessation

## 2023-04-27 NOTE — Assessment & Plan Note (Signed)
She is at high risk for lung cancer She will continue active surveillance under the guidance of pulmonologist

## 2023-04-27 NOTE — Assessment & Plan Note (Signed)
From the lymphoma perspective, she has no signs of disease I will continue to see her once a year

## 2023-04-27 NOTE — Progress Notes (Signed)
[facility] OFFICE PROGRESS NOTE  Patient Care Team: Etta Grandchild, MD as PCP - General (Internal Medicine) Artis Delay, MD as Consulting Physician (Hematology and Oncology) Glendale Chard, DO as Consulting Physician (Neurology)  ASSESSMENT & PLAN:  Low grade lymphoma, stage I (HCC) From the lymphoma perspective, she has no signs of disease I will continue to see her once a year  Tobacco abuse The patient is at high risk for cancer due to ongoing smoking We discussed importance of nicotine cessation  Multiple lung nodules on CT She is at high risk for lung cancer She will continue active surveillance under the guidance of pulmonologist  No orders of the defined types were placed in this encounter.   All questions were answered. The patient knows to call the clinic with any problems, questions or concerns. The total time spent in the appointment was 20 minutes encounter with patients including review of chart and various tests results, discussions about plan of care and coordination of care plan   Artis Delay, MD 04/27/2023 1:28 PM  INTERVAL HISTORY: Please see below for problem oriented charting. she returns for surveillance follow-up for history of lymphoma She has no new lymphadenopathy She is undergoing a lot of stress taking care of multiple family members with cancer Unfortunately, she is not able to quit smoking She average 4 to 5 cigarettes/day  REVIEW OF SYSTEMS:   Constitutional: Denies fevers, chills or abnormal weight loss Eyes: Denies blurriness of vision Ears, nose, mouth, throat, and face: Denies mucositis or sore throat Respiratory: Denies cough, dyspnea or wheezes Cardiovascular: Denies palpitation, chest discomfort or lower extremity swelling Gastrointestinal:  Denies nausea, heartburn or change in bowel habits Skin: Denies abnormal skin rashes Lymphatics: Denies new lymphadenopathy or easy bruising Neurological:Denies numbness, tingling  or new weaknesses Behavioral/Psych: Mood is stable, no new changes  All other systems were reviewed with the patient and are negative.  I have reviewed the past medical history, past surgical history, social history and family history with the patient and they are unchanged from previous note.  ALLERGIES:  is allergic to ciprofloxacin, ofloxacin, and other.  MEDICATIONS:  Current Outpatient Medications  Medication Sig Dispense Refill   Accu-Chek Softclix Lancets lancets Use to check blood sugar daily. DX: E11.9 100 each 3   Blood Glucose Monitoring Suppl (ACCU-CHEK GUIDE ME) w/Device KIT 1 Act by Does not apply route in the morning and at bedtime. 1 kit 0   Cholecalciferol 50 MCG (2000 UT) TABS Take 2 tablets (4,000 Units total) by mouth daily. 180 tablet 1   empagliflozin (JARDIANCE) 10 MG TABS tablet Take 1 tablet (10 mg total) by mouth daily. 90 tablet 1   glucose blood (ACCU-CHEK GUIDE) test strip Use to check blood sugar daily. DX: E11.9 100 each 3   indapamide (LOZOL) 1.25 MG tablet TAKE 1 TABLET BY MOUTH EVERY DAY 90 tablet 1   rosuvastatin (CRESTOR) 20 MG tablet TAKE 1 TABLET(20 MG) BY MOUTH DAILY WITH BREAKFAST 90 tablet 1   No current facility-administered medications for this visit.    SUMMARY OF ONCOLOGIC HISTORY: Oncology History  Low grade lymphoma, stage I (HCC)  12/18/2014 Pathology Results   Accession: [id number] Flow cytometry showed NHL   01/01/2015 Imaging   CT scan of the chest was performed due to chest pressure and it came back negative.     PHYSICAL EXAMINATION: ECOG PERFORMANCE STATUS: 0 - Asymptomatic  Vitals:   04/27/23 1223  BP: (!) 149/76  Pulse: 76  Resp: 18  Temp: 98 F (36.7 C)  SpO2: 99%   Filed Weights   04/27/23 1223  Weight: 157 lb 12.8 oz (71.6 kg)    GENERAL:alert, no distress and comfortable SKIN: skin color, texture, turgor are normal, no rashes or significant lesions EYES: normal, Conjunctiva are pink and non-injected, sclera  clear OROPHARYNX:no exudate, no erythema and lips, buccal mucosa, and tongue normal  NECK: supple, thyroid normal size, non-tender, without nodularity.  Large lipoma on her right side of the neck LYMPH:  no palpable lymphadenopathy in the cervical, axillary or inguinal LUNGS: clear to auscultation and percussion with normal breathing effort HEART: regular rate & rhythm and no murmurs and no lower extremity edema ABDOMEN:abdomen soft, non-tender and normal bowel sounds Musculoskeletal:no cyanosis of digits and no clubbing  NEURO: alert & oriented x 3 with fluent speech, no focal motor/sensory deficits  LABORATORY DATA:  I have reviewed the data as listed    Component Value Date/Time   NA 139 04/27/2023 1203   NA 140 01/02/2016 1034   K 3.7 04/27/2023 1203   K 4.0 01/02/2016 1034   CL 103 04/27/2023 1203   CO2 28 04/27/2023 1203   CO2 27 01/02/2016 1034   GLUCOSE 106 (H) 04/27/2023 1203   GLUCOSE 99 01/02/2016 1034   BUN 13 04/27/2023 1203   BUN 9.0 01/02/2016 1034   CREATININE 0.78 04/27/2023 1203   CREATININE 0.83 04/21/2022 1537   CREATININE 0.8 01/02/2016 1034   CALCIUM 9.8 04/27/2023 1203   CALCIUM 9.6 01/02/2016 1034   PROT 7.1 04/27/2023 1203   PROT 7.8 01/02/2016 1034   ALBUMIN 4.4 04/27/2023 1203   ALBUMIN 4.1 01/02/2016 1034   AST 13 (L) 04/27/2023 1203   AST 14 (L) 04/21/2022 1537   AST 17 01/02/2016 1034   ALT 12 04/27/2023 1203   ALT 16 04/21/2022 1537   ALT 17 01/02/2016 1034   ALKPHOS 67 04/27/2023 1203   ALKPHOS 76 01/02/2016 1034   BILITOT 0.4 04/27/2023 1203   BILITOT 0.5 04/21/2022 1537   BILITOT 0.45 01/02/2016 1034   GFRNONAA >60 04/27/2023 1203   GFRNONAA >60 04/21/2022 1537   GFRAA >60 04/22/2020 1308    No results found for: "SPEP", "UPEP"  Lab Results  Component Value Date   WBC 6.1 04/27/2023   NEUTROABS 1.9 04/27/2023   HGB 13.3 04/27/2023   HCT 38.9 04/27/2023   MCV 88.8 04/27/2023   PLT 388 04/27/2023      Chemistry       Component Value Date/Time   NA 139 04/27/2023 1203   NA 140 01/02/2016 1034   K 3.7 04/27/2023 1203   K 4.0 01/02/2016 1034   CL 103 04/27/2023 1203   CO2 28 04/27/2023 1203   CO2 27 01/02/2016 1034   BUN 13 04/27/2023 1203   BUN 9.0 01/02/2016 1034   CREATININE 0.78 04/27/2023 1203   CREATININE 0.83 04/21/2022 1537   CREATININE 0.8 01/02/2016 1034      Component Value Date/Time   CALCIUM 9.8 04/27/2023 1203   CALCIUM 9.6 01/02/2016 1034   ALKPHOS 67 04/27/2023 1203   ALKPHOS 76 01/02/2016 1034   AST 13 (L) 04/27/2023 1203   AST 14 (L) 04/21/2022 1537   AST 17 01/02/2016 1034   ALT 12 04/27/2023 1203   ALT 16 04/21/2022 1537   ALT 17 01/02/2016 1034   BILITOT 0.4 04/27/2023 1203   BILITOT 0.5 04/21/2022 1537   BILITOT 0.45 01/02/2016 1034

## 2023-05-07 ENCOUNTER — Ambulatory Visit: Payer: Medicare Other | Admitting: Podiatry

## 2023-05-12 ENCOUNTER — Other Ambulatory Visit: Payer: Self-pay | Admitting: Internal Medicine

## 2023-05-12 DIAGNOSIS — I10 Essential (primary) hypertension: Secondary | ICD-10-CM

## 2023-05-20 DIAGNOSIS — H52203 Unspecified astigmatism, bilateral: Secondary | ICD-10-CM | POA: Diagnosis not present

## 2023-05-20 DIAGNOSIS — H2513 Age-related nuclear cataract, bilateral: Secondary | ICD-10-CM | POA: Diagnosis not present

## 2023-05-20 DIAGNOSIS — H5213 Myopia, bilateral: Secondary | ICD-10-CM | POA: Diagnosis not present

## 2023-05-20 DIAGNOSIS — E119 Type 2 diabetes mellitus without complications: Secondary | ICD-10-CM | POA: Diagnosis not present

## 2023-05-20 LAB — HM DIABETES EYE EXAM

## 2023-05-21 DIAGNOSIS — H2513 Age-related nuclear cataract, bilateral: Secondary | ICD-10-CM | POA: Diagnosis not present

## 2023-05-21 DIAGNOSIS — H04123 Dry eye syndrome of bilateral lacrimal glands: Secondary | ICD-10-CM | POA: Diagnosis not present

## 2023-06-11 DIAGNOSIS — H2513 Age-related nuclear cataract, bilateral: Secondary | ICD-10-CM | POA: Diagnosis not present

## 2023-06-29 ENCOUNTER — Telehealth: Payer: Self-pay

## 2023-06-29 NOTE — Telephone Encounter (Signed)
Pt scheduled for 9/25 @ 2.20pm

## 2023-06-29 NOTE — Telephone Encounter (Signed)
Patient fell out of the bed and is having shoulder pain. She wants to schedule an appointment and there is no availability for the next 2 weeks. I am unable to over ride a same day slot, so I advised patient I would send a message to clinical staff and someone would call her back. PLEASE CALL PATIENT

## 2023-06-30 ENCOUNTER — Encounter: Payer: Self-pay | Admitting: Internal Medicine

## 2023-06-30 ENCOUNTER — Ambulatory Visit (INDEPENDENT_AMBULATORY_CARE_PROVIDER_SITE_OTHER): Payer: Medicare Other

## 2023-06-30 ENCOUNTER — Ambulatory Visit (INDEPENDENT_AMBULATORY_CARE_PROVIDER_SITE_OTHER): Payer: Medicare Other | Admitting: Internal Medicine

## 2023-06-30 VITALS — BP 138/80 | HR 75 | Temp 98.5°F | Resp 16 | Ht 66.75 in | Wt 155.0 lb

## 2023-06-30 DIAGNOSIS — I10 Essential (primary) hypertension: Secondary | ICD-10-CM | POA: Diagnosis not present

## 2023-06-30 DIAGNOSIS — S4991XA Unspecified injury of right shoulder and upper arm, initial encounter: Secondary | ICD-10-CM

## 2023-06-30 DIAGNOSIS — L2084 Intrinsic (allergic) eczema: Secondary | ICD-10-CM | POA: Diagnosis not present

## 2023-06-30 DIAGNOSIS — B354 Tinea corporis: Secondary | ICD-10-CM | POA: Diagnosis not present

## 2023-06-30 DIAGNOSIS — M19011 Primary osteoarthritis, right shoulder: Secondary | ICD-10-CM | POA: Diagnosis not present

## 2023-06-30 DIAGNOSIS — S199XXA Unspecified injury of neck, initial encounter: Secondary | ICD-10-CM

## 2023-06-30 DIAGNOSIS — Z981 Arthrodesis status: Secondary | ICD-10-CM | POA: Diagnosis not present

## 2023-06-30 DIAGNOSIS — E118 Type 2 diabetes mellitus with unspecified complications: Secondary | ICD-10-CM

## 2023-06-30 DIAGNOSIS — M25519 Pain in unspecified shoulder: Secondary | ICD-10-CM | POA: Diagnosis not present

## 2023-06-30 DIAGNOSIS — M25511 Pain in right shoulder: Secondary | ICD-10-CM | POA: Diagnosis not present

## 2023-06-30 DIAGNOSIS — E785 Hyperlipidemia, unspecified: Secondary | ICD-10-CM

## 2023-06-30 DIAGNOSIS — Z7984 Long term (current) use of oral hypoglycemic drugs: Secondary | ICD-10-CM | POA: Diagnosis not present

## 2023-06-30 DIAGNOSIS — B351 Tinea unguium: Secondary | ICD-10-CM | POA: Diagnosis not present

## 2023-06-30 DIAGNOSIS — N182 Chronic kidney disease, stage 2 (mild): Secondary | ICD-10-CM

## 2023-06-30 DIAGNOSIS — E1122 Type 2 diabetes mellitus with diabetic chronic kidney disease: Secondary | ICD-10-CM

## 2023-06-30 DIAGNOSIS — E2839 Other primary ovarian failure: Secondary | ICD-10-CM | POA: Insufficient documentation

## 2023-06-30 DIAGNOSIS — M542 Cervicalgia: Secondary | ICD-10-CM | POA: Diagnosis not present

## 2023-06-30 DIAGNOSIS — Z4789 Encounter for other orthopedic aftercare: Secondary | ICD-10-CM | POA: Diagnosis not present

## 2023-06-30 LAB — BASIC METABOLIC PANEL
BUN: 12 mg/dL (ref 6–23)
CO2: 31 mEq/L (ref 19–32)
Calcium: 10.2 mg/dL (ref 8.4–10.5)
Chloride: 101 mEq/L (ref 96–112)
Creatinine, Ser: 0.85 mg/dL (ref 0.40–1.20)
GFR: 71.11 mL/min (ref >60.00)
Glucose, Bld: 113 mg/dL — ABNORMAL HIGH (ref 70–99)
Potassium: 3.9 mEq/L (ref 3.5–5.1)
Sodium: 139 mEq/L (ref 135–145)

## 2023-06-30 LAB — MICROALBUMIN / CREATININE URINE RATIO
Creatinine,U: 152.6 mg/dL
Microalb Creat Ratio: 0.5 mg/g (ref 0.0–30.0)
Microalb, Ur: 0.7 mg/dL (ref 0.0–1.9)

## 2023-06-30 LAB — HEMOGLOBIN A1C: Hgb A1c MFr Bld: 6.6 % — ABNORMAL HIGH (ref 4.6–6.5)

## 2023-06-30 LAB — TSH: TSH: 0.58 u[IU]/mL (ref 0.35–5.50)

## 2023-06-30 MED ORDER — TERBINAFINE HCL 250 MG PO TABS: 250.0000 mg | ORAL_TABLET | Freq: Every day | ORAL | 0 refills | Status: AC

## 2023-06-30 MED ORDER — TRIAMCINOLONE ACETONIDE 0.5 % EX CREA
1.0000 | TOPICAL_CREAM | Freq: Three times a day (TID) | CUTANEOUS | 0 refills | Status: DC
Start: 2023-06-30 — End: 2023-07-01

## 2023-06-30 NOTE — Progress Notes (Signed)
Subjective:  Patient ID: Lauren Orozco, female    DOB: [birth year]  Age: 67 y.o. MRN: [id number]  CC: Rash, Hypertension, Hyperlipidemia, and Diabetes   HPI Lauren Orozco presents for f/up ---   Discussed the use of AI scribe software for clinical note transcription with the patient, who gave verbal consent to proceed.  History of Present Illness   The patient, with a history of neck fusion, presented with complaints of a fall from bed two days prior. They reported rolling uncontrollably and landing hard on the floor, primarily impacting the right shoulder. They denied hearing or feeling any cracking sounds but noted pain in the hand hours after the fall. The patient reported limited range of motion in the right shoulder, unable to scratch their head or wash their back.  They denied any loss of consciousness but reported hitting the floor hard. The patient also reported dizziness and lightheadedness, which they felt was significant enough to impair their ability to drive.  The patient also reported a month-old injury to their thumb, which they believed they might have smashed. They also reported a spot on their thumb that appeared recently, the origin of which was unknown to them.  The patient denied any chest pain or shortness of breath but admitted to being a smoker. They also reported having an upcoming eye surgery for cataract removal. The patient reported taking hydrocodone for the pain. They denied any acute pain during the consultation but described an achy feeling.       Outpatient Medications Prior to Visit  Medication Sig Dispense Refill   Accu-Chek Softclix Lancets lancets Use to check blood sugar daily. DX: E11.9 100 each 3   Blood Glucose Monitoring Suppl (ACCU-CHEK GUIDE ME) w/Device KIT 1 Act by Does not apply route in the morning and at bedtime. 1 kit 0   Cholecalciferol 50 MCG (2000 UT) TABS Take 2 tablets (4,000 Units total) by mouth daily.  180 tablet 1   empagliflozin (JARDIANCE) 10 MG TABS tablet Take 1 tablet (10 mg total) by mouth daily. 90 tablet 1   glucose blood (ACCU-CHEK GUIDE) test strip Use to check blood sugar daily. DX: E11.9 100 each 3   indapamide (LOZOL) 1.25 MG tablet TAKE 1 TABLET BY MOUTH EVERY DAY 90 tablet 0   rosuvastatin (CRESTOR) 20 MG tablet TAKE 1 TABLET(20 MG) BY MOUTH DAILY WITH BREAKFAST 90 tablet 1   No facility-administered medications prior to visit.    ROS Review of Systems  Objective:  BP 138/80 (BP Location: Right Arm, Patient Position: Sitting, Cuff Size: Large)   Pulse 75   Temp 98.5 F (36.9 C) (Oral)   Resp 16   Ht 5' 6.75" (1.695 m)   Wt 155 lb (70.3 kg)   SpO2 98%   BMI 24.46 kg/m   BP Readings from Last 3 Encounters:  06/30/23 138/80  04/27/23 (!) 149/76  01/19/23 (!) 142/82    Wt Readings from Last 3 Encounters:  06/30/23 155 lb (70.3 kg)  04/27/23 157 lb 12.8 oz (71.6 kg)  01/19/23 149 lb (67.6 kg)    Physical Exam Vitals reviewed.  Constitutional:      Appearance: Normal appearance.  HENT:     Nose: Nose normal.     Mouth/Throat:     Mouth: Mucous membranes are moist.  Eyes:     General: No scleral icterus.    Conjunctiva/sclera: Conjunctivae normal.  Cardiovascular:     Rate and Rhythm: Normal  rate and regular rhythm.     Heart sounds: No murmur heard.    No friction rub. No gallop.  Pulmonary:     Effort: Pulmonary effort is normal.     Breath sounds: No stridor. No wheezing, rhonchi or rales.  Abdominal:     Palpations: There is no mass.     Tenderness: There is no abdominal tenderness. There is no guarding.     Hernia: No hernia is present.  Musculoskeletal:        General: Normal range of motion.     Right shoulder: Normal. No swelling, deformity or tenderness. Normal range of motion.     Left shoulder: Normal. No swelling, deformity or tenderness. Normal range of motion.     Cervical back: Normal and neck supple. No edema or tenderness. No  pain with movement. Normal range of motion.     Thoracic back: Normal.     Lumbar back: Normal.  Lymphadenopathy:     Cervical: No cervical adenopathy.  Skin:    Findings: Rash present. No lesion.     Comments: Left gluteal region reveals xerosis with faint scale.  Left ankle reveals a patch of scale, hyperpigmentation, and papules.   Neurological:     General: No focal deficit present.     Mental Status: She is alert. Mental status is at baseline.  Psychiatric:        Mood and Affect: Mood normal.        Behavior: Behavior normal.     Lab Results  Component Value Date   WBC 6.1 04/27/2023   HGB 13.3 04/27/2023   HCT 38.9 04/27/2023   PLT 388 04/27/2023   GLUCOSE 113 (H) 06/30/2023   CHOL 164 05/20/2022   TRIG 64.0 05/20/2022   HDL 58.60 05/20/2022   LDLDIRECT 150.8 10/19/2011   LDLCALC 93 05/20/2022   ALT 12 04/27/2023   AST 13 (L) 04/27/2023   NA 139 06/30/2023   K 3.9 06/30/2023   CL 101 06/30/2023   CREATININE 0.85 06/30/2023   BUN 12 06/30/2023   CO2 31 06/30/2023   TSH 0.58 06/30/2023   HGBA1C 6.6 (H) 06/30/2023   MICROALBUR 0.7 06/30/2023    CT Super D Chest Wo Contrast  Result Date: 10/16/2022 CLINICAL DATA:  Follow-up nodules EXAM: CT CHEST WITHOUT CONTRAST TECHNIQUE: Multidetector CT imaging of the chest was performed using thin slice collimation for electromagnetic bronchoscopy planning purposes, without intravenous contrast. RADIATION DOSE REDUCTION: This exam was performed according to the departmental dose-optimization program which includes automated exposure control, adjustment of the mA and/or kV according to patient size and/or use of iterative reconstruction technique. COMPARISON:  Lung cancer screening CT dated April 21, 2022 FINDINGS: Cardiovascular: Normal heart size. No pericardial effusion. Normal caliber thoracic aorta with moderate calcified plaque. Moderate coronary artery calcifications. Mediastinum/Nodes: Moderate coronary artery  calcifications. Esophagus and thyroid are unremarkable no pathologically enlarged lymph nodes seen in the chest. Lungs/Pleura: Central airways are patent. No consolidation, pleural effusion or pneumothorax. Mild centrilobular emphysema. Pulmonary nodules which were new on prior lung cancer screening CT have resolved. Other previously seen small solid pulmonary nodules are stable. Reference solid pulmonary nodule of the left lower lobe measuring 4 mm on series 4, image 105. Upper Abdomen: Stable benign right adenoma, no specific follow-up imaging is recommended. Musculoskeletal: No chest wall mass or suspicious bone lesions identified. IMPRESSION: 1. Pulmonary nodules which were new on prior lung cancer screening CT have resolved. Other previously seen small solid pulmonary nodules are  stable. Recommend return to annual lung cancer screening. 2. Coronary artery calcifications, aortic Atherosclerosis (ICD10-I70.0) and Emphysema (ICD10-J43.9). Electronically Signed   By: Allegra Lai M.D.   On: 10/16/2022 11:27   No results found.   Assessment & Plan:   Hyperlipidemia with target LDL less than 130 -     TSH; Future  Essential hypertension - Her BP is well controlled. -     TSH; Future -     Urinalysis, Routine w reflex microscopic; Future -     Basic metabolic panel; Future  Type II diabetes mellitus with manifestations (HCC) - Her blood sugar is well controlled. -     Urinalysis, Routine w reflex microscopic; Future -     Hemoglobin A1c; Future -     Microalbumin / creatinine urine ratio; Future -     Basic metabolic panel; Future  CKD stage 2 due to type 2 diabetes mellitus (HCC) -     Urinalysis, Routine w reflex microscopic; Future -     Microalbumin / creatinine urine ratio; Future -     Basic metabolic panel; Future  Estrogen deficiency -     DG Bone Density; Future  Right shoulder injury, initial encounter -     DG Shoulder Right; Future  Neck injury, initial encounter -      DG Cervical Spine Complete; Future  Onychomycosis of nail of digit of hand -     Terbinafine HCl; Take 1 tablet (250 mg total) by mouth daily.  Dispense: 42 tablet; Refill: 0  Intrinsic eczema -     Fluocinonide Emulsified Base; Apply 1 Application topically 2 (two) times daily.  Dispense: 60 g; Refill: 1  Tinea corporis -     Terbinafine HCl; Take 1 tablet (250 mg total) by mouth daily.  Dispense: 42 tablet; Refill: 0     Follow-up: No follow-ups on file.  Sanda Linger, MD

## 2023-07-01 ENCOUNTER — Telehealth: Payer: Self-pay | Admitting: Internal Medicine

## 2023-07-01 LAB — URINALYSIS, ROUTINE W REFLEX MICROSCOPIC
Bilirubin Urine: NEGATIVE
Hgb urine dipstick: NEGATIVE
Ketones, ur: NEGATIVE
Leukocytes,Ua: NEGATIVE
Nitrite: NEGATIVE
Specific Gravity, Urine: 1.02 (ref 1.000–1.030)
Total Protein, Urine: NEGATIVE
Urine Glucose: NEGATIVE
Urobilinogen, UA: 0.2 (ref 0.0–1.0)
pH: 8.5 — AB (ref 5.0–8.0)

## 2023-07-01 MED ORDER — FLUOCINONIDE EMULSIFIED BASE 0.05 % EX CREA
1.0000 | TOPICAL_CREAM | Freq: Two times a day (BID) | CUTANEOUS | 1 refills | Status: DC
Start: 2023-07-01 — End: 2024-04-05

## 2023-07-01 NOTE — Telephone Encounter (Signed)
[facility], [place]  Advanced Heart Failure and Transplant  Heart Transplant Clinic  Follow-up Visit    Primary Care Physician: Shana Chute  Referring Provider: Veverly Fells  Date of Transplant: 11/24/2019  Organ(s) Transplanted: heart  Indication for transplant: Dilated Myopathy: Idiopathic  PHS increased risk donor: Yes    ID. 67 year old female with end-stage HFrEF 2/2 NICM s/p OHT 11/24/19, history of 2R, HTN, HLD and anxiety coming in for f/u of heart transplant.    Interval History:    The patient was last seen on 01/19/22. At that time issues with pain after urologic procedure.    He continues to deal with pain issue largely from prostate surgery. He still has some bleeding and some tissue come out. He tried different strategies and nothing helped. This is really impacting quality of life. He gets tired and frustrated and does not want to take it out on his family.    ROS:  A complete ROS was performed and is negative except as documented in the HPI.      Allergies:  Patient is allergic to cats [other] and dogs [other].    Past Medical History:   Diagnosis Date    Asthma     Atrial fibrillation (CMS-HCC)     Chronic HFrEF (heart failure with reduced ejection fraction) (CMS-HCC)     GERD (gastroesophageal reflux disease)     HTN (hypertension)     Insomnia     Nephrolithiasis     Sinusitis      Patient Active Problem List   Diagnosis    COPD (chronic obstructive pulmonary disease) (CMS-HCC)    Heart transplant, orthotopic, 11/24/2019    Pericardial effusion    Hypertension    Chronic back pain    At risk for infection transmitted from donor    Acute hepatitis C virus infection    Heart transplanted (CMS-HCC)    Acute UTI    Umbilical hernia without obstruction and without gangrene    COVID-19 virus detected    Acute medial meniscus tear of left knee, sequela    Localized osteoarthritis of left knee     Past Surgical History:   Procedure Laterality Date    CARDIAC DEFIBRILLATOR PLACEMENT       PB ANESTH,SHOULDER JOINT,NOS Right      Family History   Problem Relation Name Age of Onset    Hypertension Other      Other Maternal Grandmother          kidney disease needing HD     Social History     Socioeconomic History    Marital status: Single     Spouse name: Not on file    Number of children: Not on file    Years of education: Not on file    Highest education level: Not on file   Occupational History    Not on file   Tobacco Use    Smoking status: Never    Smokeless tobacco: Never    Tobacco comments:     from friends and relatives    Substance and Sexual Activity    Alcohol use: Not Currently     Comment: Prior heavier use, but completely quit in 2016    Drug use: Yes     Comment: eats edible marijuana for pain and insomnia     Sexual activity: Not on file   Other Topics Concern    Not on file   Social History Narrative  Born in [address], also lived in [place], [place], [place]. 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 Insecurity: Not on file   Transportation Needs: Not on file   Physical Activity: Not on file   Stress: Not on file   Social Connections: Not on file   Intimate Partner Violence: Not on file   Housing Stability: Not on file     Current Outpatient Medications   Medication Sig    albuterol 108 (90 Base) MCG/ACT inhaler Inhale 2 puffs by mouth every 4 hours as needed for Wheezing or Shortness of Breath.    aspirin 81 MG EC tablet Take 1 tablet (81 mg) by mouth daily.    baclofen (LIORESAL) 10 MG tablet Take 2 tablets (20 mg) by mouth nightly.    Blood Glucose Monitoring Suppl (TRUE METRIX METER) w/Device KIT Use as directed    budesonide-formoterol (SYMBICORT) 160-4.5 MCG/ACT inhaler Inhale 2 puffs by mouth every 12 hours.    bumetanide (BUMEX) 1 MG tablet Take 1 tablet (1 mg) by mouth daily as needed (fluid/weight  gain). Do not take unless instructed by Transplant team.    Calcium Carb-Cholecalciferol 600-10 MG-MCG TABS Take 1 tablet by mouth 2 times daily.    Cetirizine HCl (ZERVIATE) 0.24 % SOLN Place 1 drop into both eyes 2 times daily.    clindamycin (CLEOCIN T) 1 % solution Apply 1 Application. topically 2 times daily. Apply to the red bumps on your face up to two times a day.    controlled substance agreement controlled substance agreement    diclofenac (VOLTAREN) 1 % gel Apply 2 g topically 4 times daily.    docusate sodium (COLACE) 100 MG capsule Take 1 capsule (100 mg) by mouth 2 times daily.    DULoxetine (CYMBALTA) 30 MG CR capsule Take 1 capsule (30 mg) by mouth daily.    famotidine (PEPCID) 20 MG tablet Take 1 tablet (20 mg) by mouth 2 times daily.    fluticasone propionate (FLONASE) 50 MCG/ACT nasal spray Spray 1 spray into each nostril 2 times daily.    gabapentin (NEURONTIN) 300 MG capsule Take 1 capsule (300 mg) by mouth every morning AND 1 capsule (300 mg) daily AND 2 capsules (600 mg) every evening.    hydroCHLOROthiazide (HYDRODIURIL) 25 MG tablet Take 1 tablet (25 mg) by mouth daily.    ketoconazole (NIZORAL) 2 % shampoo Use shampoo daily for dandruff    lidocaine (LIDOCAINE PAIN RELIEF) 4 % patch Apply 1 patch topically every 24 hours. Leave patch on for 12 hours, then remove for 12 hours.    lisinopril (PRINIVIL, ZESTRIL) 10 MG tablet Take 2 tablets (20 mg) by mouth daily.    magnesium oxide (MAG-OX) 400 MG tablet Take 1 tablet by mouth daily    melatonin (GNP MELATONIN MAXIMUM STRENGTH) 5 MG tablet Take 2 tablets (10 mg) by mouth at bedtime.    Multiple Vitamin (MULTIVITAMIN) TABS tablet Take 1 tablet by mouth daily.    naloxone (KLOXXADO) 8 mg/0.1 mL nasal spray Call 911! Tilt head and spray intranasally into one nostril as needed for respiratory depression. If patient does not respond or responds and then relapses, repeat using a new nasal spray every 3 minutes until emergency medical assistance  arrives.    NEEDLE, DISP, 25 G 25G X  1" MISC Use to inject testosterone    NIFEdipine (ADALAT CC) 30 MG Controlled-Release tablet Take 1 tablet (30 mg) by mouth nightly.    ondansetron (ZOFRAN) 8 MG tablet Take 1 tablet (8 mg) by mouth every 8 hours as needed for Nausea/Vomiting.    oxyCODONE (ROXICODONE) 10 MG tablet Take 1 tab every 4 hours as needed for moderate pain and 2 tabs every 4 hours as needed for severe pain. Max 10 tabs per day, 28 day supply    phenazopyridine (PYRIDIUM) 100 MG tablet Take 1 tablet (100 mg) by mouth 3 times daily.    polyethylene glycol (GLYCOLAX) 17 GM/SCOOP powder Mix 17 grams in 4-8 oz of liquide and drink by mouth daily as needed (Constipation).    pravastatin (PRAVACHOL) 40 MG tablet Take 1 tablet (40 mg) by mouth every evening.    senna (SENOKOT) 8.6 MG tablet Take 1 tablet (8.6 mg) by mouth daily.    sirolimus (RAPAMUNE) 1 MG tablet Take 2 tablets (2 mg) by mouth every morning.    SYRINGE-NEEDLE, DISP, 3 ML (B-D 3CC LUER-LOK SYR 25GX1") 25G X 1" 3 ML MISC Use as directed to inject testosterone    SYRINGE-NEEDLE, DISP, 3 ML 18G X 1-1/2" 3 ML MISC Use to draw up testosterone    tacrolimus (ENVARSUS XR) 1 MG tablet STOP TAKING since 06/17/22 - remaining on chart for dose adjustments, titratable med.    tacrolimus (ENVARSUS XR) 4 MG tablet Take 1 tablet (4 mg) by mouth every morning.    tamsulosin (FLOMAX) 0.4 MG capsule Take 1 capsule (0.4 mg) by mouth daily.    tamsulosin (FLOMAX) 0.4 MG capsule Take 1 capsule (0.4 mg) by mouth daily.    testosterone cypionate (DEPO-TESTOSTERONE) 200 MG/ML SOLN Inject 1 ml into the muscle every 14 days    traZODone (DESYREL) 50 MG tablet Take 1 tablet (50 mg) by mouth nightly.     Current Facility-Administered Medications   Medication    diphenhydrAMINE (BENADRYL) injection 50 mg    diphenhydrAMINE (BENADRYL) tablet 50 mg     Immunization History   Administered Date(s) Administered    COVID-19 (Moderna) Low Dose Red Cap >= 18 Years 11/15/2020     COVID-19 (Moderna) Red Cap >= 12 Years 12/23/2019, 01/22/2020, 05/22/2020    Hep-A/Hep-B; Twinrix, Adult 12/16/2020    Influenza Vaccine (High Dose) Quadrivalent >=65 Years 08/07/2020    Influenza Vaccine (Unspecified) 06/05/2017    Influenza Vaccine >=6 Months 08/18/2010, 08/18/2011, 10/13/2012, 07/11/2014, 06/24/2018, 07/10/2019    Pneumococcal 13 Vaccine (PREVNAR-13) 08/07/2020    Pneumococcal 23 Vaccine (PNEUMOVAX-23) 08/18/2013, 12/16/2020    Tdap 10/06/2011   Deferred Date(s) Deferred    Pneumococcal 23 Vaccine (PNEUMOVAX-23) 12/07/2019     Physical Exam:  BP 102/69 (BP Location: Right arm, BP Patient Position: Sitting, BP cuff size: Large)   Pulse 98   Temp 98.5 F (36.9 C) (Temporal)   Resp 16   Ht 5\' 10"  (1.778 m)   Wt 96.2 kg (212 lb)   SpO2 97%   BMI 30.42 kg/m      General Appearance: ***alert, no distress, pleasant affect, cooperative.  Heart:  JVD ***, PMI ***, normal rate and regular rhythm, no murmurs, clicks, or gallops. ***  Lungs: ***clear to auscultation and percussion. No rales, rhonchi, or wheezes noted. No chest deformities noted.  Abdomen: ***BS normal.  Abdomen soft, non-tender.  No masses or organomegaly.  Extremities:  ***no cyanosis, clubbing, or edema. Has 2+ peripheral pulses.  Lab Data:  Lab Results   Component Value Date    BUN 26 (H) 06/17/2022    CREAT 1.98 (H) 06/17/2022    CL 99 06/17/2022    NA 140 06/17/2022    K 4.4 06/17/2022    CA 9.2 06/17/2022    TBILI 0.47 06/17/2022    ALB 4.1 06/17/2022    TP 7.1 06/17/2022    AST 22 06/17/2022    ALK 76 06/17/2022    BICARB 29 06/17/2022    ALT 25 06/17/2022    GLU 126 (H) 06/17/2022     Lab Results   Component Value Date    WBC 7.9 06/17/2022    RBC 5.50 06/17/2022    HGB 15.2 06/17/2022    HCT 46.5 06/17/2022    MCV 84.5 06/17/2022    MCHC 32.7 06/17/2022    RDW 12.3 06/17/2022    PLT 162 06/17/2022    MPV 11.6 06/17/2022     Lab Results   Component Value Date    A1C 5.7 11/14/2021     Lab Results   Component Value Date     TSH 1.63 11/14/2021     Lab Results   Component Value Date    CHOL 105 11/14/2021    HDL 38 11/14/2021    LDLCALC 43 11/14/2021    TRIG 121 11/14/2021     Lab Results   Component Value Date    SIROT 11.5 06/17/2022     Lab Results   Component Value Date    FKTR 6.5 06/17/2022     No results found for: CSATR  Lab Results   Component Value Date    CMVPL Not Detected 09/05/2021     Lab Results   Component Value Date    DSA ABSENT 06/17/2022       Prior Cardiovascular Studies:   Lab Results   Component Value Date    LV Ejection Fraction 59 12/11/2021          Echo 12/11/21  Summary:   1. The left ventricular size is normal. The left ventricular systolic function is normal.   2. No left ventricular hypertrophy.   3. Normal pattern of left ventricular diastolic filling.   4. EF=59%.   5. Compared to prior study EF now 59%, was 69% 01/03/21.     LHC/IVUS 12/09/21  CONCLUSION:                                                                   1. Myocardial bridging with mild systolic compression of the mid segment    of the left anterior descending coronary artery.                              2. No angiographic evidence of coronary artery disease.                      3. Intimal thickness noted in LAD/LM up to 0.5 mm (Stable to slightly       worse compare to 2022).  4. Non significant FFR at apical LAD.                                        5. Left ventricular end diastolic pressure appears normal.        Assessment summary:  67 year old female with end-stage HFrEF 2/2 NICM s/p OHT 11/24/19, history of 2R, HTN, HLD and anxiety coming in for f/u of heart transplant.    Assessment/Plan:  # Hematuria  # Dysuria  # Chronic pain  Assessment: We had a long frank discussion about patient's chronic pain issues and the heart transplant team's role in this. I discussed with him that when I initially agreed to cover his chronic opiate prescription, this was the assumption that he would  have a provider versed in chronic pain after 3-4 months, but we are at 6 months and has unable to find one. Additionally, I had not put him on a pain contract at that time, but he recently used more opiates without asking and I informed him this was not appropriate, but because he had not established guidelines I was not going to stop at this time. However, going forward until he can establish with a pain physician, we will set up a pain contract and he will need to follow through like a usual pain clinic with [place] with goal of provider in 3-4 months or I may start tapering. I will augment adjuvant agents additionally for now and we can continue to work on this.  Plan:  -pain contract signed  -urine tox monthly  -clinic follow up month  -oxycodone 10 mg tablets PO, 1 tab every 4 hours moderate pain, 2 tabs every 4 hours for severe pain, no more than 10 tablets a day, total 280 per 28 days.   -diclofenac cream for joint pain  -lidocaine patch for back pain  -trial of pyridium  -increase gaba at night  -siro change as below  -cymbalta as below    # End-stage heart failure s/p orthotopic heart transplant  # Chronic Immunosuppression/Immunomodulation  Assessment: While we thought continuing sirolimus would help prevent recurrent scar tissue from prostate procedure, it may be exacerbating factors now with delayed wound healing. Will try mmf for 1 month.  Plan:   - continue envarsus 6 mg daily, goal trough 4-8  - HOLD sirolimus 3 mg daily, goal trough 4-8 for at least 1 month  - start mmf 1000 mg bid for one month to allow healing  - Continue to monitor for renal toxicities, infection risk and malignancy risk  - continue pravastatin 40 mg daily  - continue aspirin 81 mg daily    # Hypertension  Assessment: controlled  Plan:  -continue lisinopril 20 mg daily  -resume hctz  -nifedipine 30 mg daily    # Dyslipidemia  -continue pravastatin 40 mg daily    # Depression  Assessment: improved mood  Plan:  -increase cymbalta to 120  mg daily     RTC in 1 month       Lenetta Quaker, MD  Advanced Heart Failure, Mechanical Circulatory Support, Transplant  Pgr: [phone]

## 2023-07-01 NOTE — Telephone Encounter (Signed)
A pharmacy tech from [facility] called for clarification on triamcinolone cream (KENALOG) 0.5 % . They said that strength only comes in 15 g tubes. They would like to know if he wanted to prescribe her 30 tubes. They would like a call back at [phone].

## 2023-07-14 ENCOUNTER — Encounter: Payer: Self-pay | Admitting: Internal Medicine

## 2023-07-14 ENCOUNTER — Ambulatory Visit (INDEPENDENT_AMBULATORY_CARE_PROVIDER_SITE_OTHER): Payer: Medicare Other | Admitting: Internal Medicine

## 2023-07-14 VITALS — BP 150/86 | HR 72 | Temp 98.2°F | Ht 66.75 in | Wt 157.2 lb

## 2023-07-14 DIAGNOSIS — J329 Chronic sinusitis, unspecified: Secondary | ICD-10-CM

## 2023-07-14 DIAGNOSIS — R059 Cough, unspecified: Secondary | ICD-10-CM

## 2023-07-14 DIAGNOSIS — I10 Essential (primary) hypertension: Secondary | ICD-10-CM

## 2023-07-14 DIAGNOSIS — J029 Acute pharyngitis, unspecified: Secondary | ICD-10-CM

## 2023-07-14 DIAGNOSIS — E118 Type 2 diabetes mellitus with unspecified complications: Secondary | ICD-10-CM | POA: Diagnosis not present

## 2023-07-14 DIAGNOSIS — Z7984 Long term (current) use of oral hypoglycemic drugs: Secondary | ICD-10-CM

## 2023-07-14 DIAGNOSIS — N182 Chronic kidney disease, stage 2 (mild): Secondary | ICD-10-CM | POA: Diagnosis not present

## 2023-07-14 DIAGNOSIS — E1122 Type 2 diabetes mellitus with diabetic chronic kidney disease: Secondary | ICD-10-CM

## 2023-07-14 LAB — POC COVID19 BINAXNOW: SARS Coronavirus 2 Ag: NEGATIVE

## 2023-07-14 LAB — POCT INFLUENZA A/B
Influenza A, POC: NEGATIVE
Influenza B, POC: NEGATIVE

## 2023-07-14 LAB — POCT RAPID STREP A (OFFICE): Rapid Strep A Screen: NEGATIVE

## 2023-07-14 MED ORDER — DOXYCYCLINE HYCLATE 100 MG PO TABS: 100.0000 mg | ORAL_TABLET | Freq: Two times a day (BID) | ORAL | 0 refills | Status: DC

## 2023-07-14 MED ORDER — HYDROCODONE BIT-HOMATROP MBR 5-1.5 MG/5ML PO SOLN: 5.0000 mL | Freq: Four times a day (QID) | ORAL | 0 refills | Status: AC | PRN

## 2023-07-14 NOTE — Patient Instructions (Signed)
Your COVID, Flu, and Strep testing is negative today  Please take all new medication as prescribed - the antibiotic, andocugh medicine  Please continue all other medications as before, and refills have been done if requested.  Please have the pharmacy call with any other refills you may need.  Please keep your appointments with your specialists as you may have planned

## 2023-07-14 NOTE — Assessment & Plan Note (Signed)
Lab Results  Component Value Date   CREATININE 0.85 06/30/2023   Stable overall, cont to avoid nephrotoxins

## 2023-07-14 NOTE — Progress Notes (Addendum)
Patient ID: Lauren Orozco, female   DOB: 1956/03/23, 67 y.o.   MRN: [id number]        Chief Complaint: follow up sinus symptoms, htn, dm, ckd3a       HPI:  Lauren Orozco is a 67 y.o. female  Here with 2-3 days acute onset fever, facial pain, pressure, headache, general weakness and malaise, and greenish d/c, with mild ST and cough, but pt denies chest pain, wheezing, increased sob or doe, orthopnea, PND, increased LE swelling, palpitations, dizziness or syncope.   Pt denies polydipsia, polyuria, or new focal neuro s/s.        Wt Readings from Last 3 Encounters:  07/14/23 157 lb 3.2 oz (71.3 kg)  06/30/23 155 lb (70.3 kg)  04/27/23 157 lb 12.8 oz (71.6 kg)   BP Readings from Last 3 Encounters:  07/14/23 (!) 150/86  06/30/23 138/80  04/27/23 (!) 149/76         Past Medical History:  Diagnosis Date   Anxiety    Chronic neck pain    with radiculopathy   Chronic pain of left wrist 2009   "since neck surgery"   Complication of anesthesia    Hard to wake up per pt; after neck sx had to be bagged.   DEPRESSION    DISC DISEASE, CERVICAL    Diverticulitis    HYPERLIPIDEMIA    Hypertension    Lymphocytosis 01/02/2016   NEPHROLITHIASIS, HX OF    NHL (non-Hodgkin's lymphoma) (HCC) 12/25/2014   Recurrent abdominal pain    Umbilical hernia    UTI'S, CHRONIC    Past Surgical History:  Procedure Laterality Date   ABDOMINAL HYSTERECTOMY  2001   APPENDECTOMY  1981   KIDNEY STONE SURGERY  2007   LEFT OOPHORECTOMY  2001   MYOMECTOMY  1981   NECK SURGERY  2009   fusion of C1, C2, C3    reports that she quit smoking about 3 years ago. Her smoking use included cigarettes. She started smoking about 33 years ago. She has a 27 pack-year smoking history. She has never used smokeless tobacco. She reports that she does not drink alcohol and does not use drugs. family history includes Diabetes in her father and mother; Heart attack in her father; Heart disease in her father;  Hyperlipidemia in her father and mother; Hypertension in her father and mother. Allergies  Allergen Reactions   Ciprofloxacin Hives    All floxacin drugs Burn-like hives   Ofloxacin Hives    Burn like hives   Other Other (See Comments)    All NUTS and corn due to diverticulitis   Current Outpatient Medications on File Prior to Visit  Medication Sig Dispense Refill   Accu-Chek Softclix Lancets lancets Use to check blood sugar daily. DX: E11.9 100 each 3   Blood Glucose Monitoring Suppl (ACCU-CHEK GUIDE ME) w/Device KIT 1 Act by Does not apply route in the morning and at bedtime. 1 kit 0   Cholecalciferol 50 MCG (2000 UT) TABS Take 2 tablets (4,000 Units total) by mouth daily. 180 tablet 1   empagliflozin (JARDIANCE) 10 MG TABS tablet Take 1 tablet (10 mg total) by mouth daily. 90 tablet 1   fluocinonide-emollient (LIDEX-E) 0.05 % cream Apply 1 Application topically 2 (two) times daily. 60 g 1   glucose blood (ACCU-CHEK GUIDE) test strip Use to check blood sugar daily. DX: E11.9 100 each 3   indapamide (LOZOL) 1.25 MG tablet TAKE 1 TABLET BY MOUTH EVERY DAY 90 tablet  0   rosuvastatin (CRESTOR) 20 MG tablet TAKE 1 TABLET(20 MG) BY MOUTH DAILY WITH BREAKFAST 90 tablet 1   terbinafine (LAMISIL) 250 MG tablet Take 1 tablet (250 mg total) by mouth daily. 42 tablet 0   No current facility-administered medications on file prior to visit.        ROS:  All others reviewed and negative.  Objective        PE:  BP (!) 150/86 (BP Location: Left Arm, Patient Position: Sitting, Cuff Size: Normal)   Pulse 72   Temp 98.2 F (36.8 C) (Oral)   Ht 5' 6.75" (1.695 m)   Wt 157 lb 3.2 oz (71.3 kg)   SpO2 96%   BMI 24.81 kg/m                 Constitutional: Pt appears in NAD, mild ill               HENT: Head: NCAT.                Right Ear: External ear normal.                 Left Ear: External ear normal. Bilat tm's with mild erythema.  Max sinus areas mild tender.  Pharynx with mild erythema, no  exudate               Eyes: . Pupils are equal, round, and reactive to light. Conjunctivae and EOM are normal               Nose: without d/c or deformity               Neck: Neck supple. Gross normal ROM               Cardiovascular: Normal rate and regular rhythm.                 Pulmonary/Chest: Effort normal and breath sounds without rales or wheezing.                               Neurological: Pt is alert. At baseline orientation, motor grossly intact               Skin: Skin is warm. No rashes, no other new lesions, LE edema - none               Psychiatric: Pt behavior is normal without agitation   Micro: none  Cardiac tracings I have personally interpreted today:  none  Pertinent Radiological findings (summarize): none   Lab Results  Component Value Date   WBC 6.1 04/27/2023   HGB 13.3 04/27/2023   HCT 38.9 04/27/2023   PLT 388 04/27/2023   GLUCOSE 113 (H) 06/30/2023   CHOL 164 05/20/2022   TRIG 64.0 05/20/2022   HDL 58.60 05/20/2022   LDLDIRECT 150.8 10/19/2011   LDLCALC 93 05/20/2022   ALT 12 04/27/2023   AST 13 (L) 04/27/2023   NA 139 06/30/2023   K 3.9 06/30/2023   CL 101 06/30/2023   CREATININE 0.85 06/30/2023   BUN 12 06/30/2023   CO2 31 06/30/2023   TSH 0.58 06/30/2023   HGBA1C 6.6 (H) 06/30/2023   MICROALBUR 0.7 06/30/2023   POCT - covid, flu and strep negative  Assessment/Plan:  Lauren Orozco is a 67 y.o. Black or African American [2] female with  has a past medical history of Anxiety, Chronic neck  pain, Chronic pain of left wrist (2009), Complication of anesthesia, DEPRESSION, DISC DISEASE, CERVICAL, Diverticulitis, HYPERLIPIDEMIA, Hypertension, Lymphocytosis (01/02/2016), NEPHROLITHIASIS, HX OF, NHL (non-Hodgkin's lymphoma) (HCC) (12/25/2014), Recurrent abdominal pain, Umbilical hernia, and UTI'S, CHRONIC.  CKD stage 2 due to type 2 diabetes mellitus (HCC) Lab Results  Component Value Date   CREATININE 0.85 06/30/2023   Stable overall,  cont to avoid nephrotoxins   Essential hypertension BP Readings from Last 3 Encounters:  07/14/23 (!) 150/86  06/30/23 138/80  04/27/23 (!) 149/76   Uncontrolled, likely reactive, pt to continue medical treatment lozol 1.25 mg qd   Type II diabetes mellitus with manifestations (HCC) Lab Results  Component Value Date   HGBA1C 6.6 (H) 06/30/2023   Stable, pt to continue current medical treatment jardiance 10 mg qd   Sinusitis Mild to mod, for antibx course doxycycline 100 bid, and cough med prn,  to f/u any worsening symptoms or concerns  Followup: Return if symptoms worsen or fail to improve.  Oliver Barre, MD 07/14/2023 2:32 PM [facility]

## 2023-07-14 NOTE — Assessment & Plan Note (Signed)
Lab Results  Component Value Date   HGBA1C 6.6 (H) 06/30/2023   Stable, pt to continue current medical treatment jardiance 10 mg qd

## 2023-07-14 NOTE — Assessment & Plan Note (Signed)
BP Readings from Last 3 Encounters:  07/14/23 (!) 150/86  06/30/23 138/80  04/27/23 (!) 149/76   Uncontrolled, likely reactive, pt to continue medical treatment lozol 1.25 mg qd

## 2023-07-14 NOTE — Assessment & Plan Note (Signed)
Mild to mod, for antibx course doxycycline 100 bid, and cough med prn,  to f/u any worsening symptoms or concerns

## 2023-07-27 ENCOUNTER — Ambulatory Visit (INDEPENDENT_AMBULATORY_CARE_PROVIDER_SITE_OTHER): Payer: Medicare Other

## 2023-07-27 ENCOUNTER — Telehealth: Payer: Self-pay

## 2023-07-27 VITALS — Ht 66.75 in | Wt 157.0 lb

## 2023-07-27 DIAGNOSIS — Z0000 Encounter for general adult medical examination without abnormal findings: Secondary | ICD-10-CM

## 2023-07-27 NOTE — Progress Notes (Addendum)
Subjective:   Lauren Orozco is a 67 y.o. female who presents for Medicare Annual (Subsequent) preventive examination.  Visit Complete: Virtual I connected with  Rotha Laur Mcenery on 07/27/23 by a audio enabled telemedicine application and verified that I am speaking with the correct person using two identifiers.  Patient Location: Home  Provider Location: Office/Clinic  I discussed the limitations of evaluation and management by telemedicine. The patient expressed understanding and agreed to proceed.  Vital Signs: Because this visit was a virtual/telehealth visit, some criteria may be missing or patient reported. Any vitals not documented were not able to be obtained and vitals that have been documented are patient reported.  Patient Medicare AWV questionnaire was completed by the patient on 07/26/2023; I have confirmed that all information answered by patient is correct and no changes since this date.  Cardiac Risk Factors include: advanced age (>45men, >36 women);diabetes mellitus;dyslipidemia;family history of premature cardiovascular disease;hypertension     Objective:    Today's Vitals   07/27/23 1418 07/27/23 1420  Weight: 157 lb (71.2 kg)   Height: 5' 6.75" (1.695 m)   PainSc: 5  5   PainLoc: Buttocks    Body mass index is 24.77 kg/m.     07/27/2023    2:26 PM 08/29/2022    1:48 PM 03/23/2022    2:14 PM 03/29/2021    7:25 AM 03/01/2021   12:31 AM 03/14/2020    6:01 PM 04/10/2019    7:44 PM  Advanced Directives  Does Patient Have a Medical Advance Directive? Yes No Yes No No No No  Type of Estate agent of [place];Living will  Healthcare Power of [place];Living will      Copy of Healthcare Power of Attorney in Chart? No - copy requested  No - copy requested      Would patient like information on creating a medical advance directive?  No - Patient declined    No - Patient declined     Current Medications (verified) Outpatient  Encounter Medications as of 07/27/2023  Medication Sig   Accu-Chek Softclix Lancets lancets Use to check blood sugar daily. DX: E11.9   Blood Glucose Monitoring Suppl (ACCU-CHEK GUIDE ME) w/Device KIT 1 Act by Does not apply route in the morning and at bedtime.   Cholecalciferol 50 MCG (2000 UT) TABS Take 2 tablets (4,000 Units total) by mouth daily.   doxycycline (VIBRA-TABS) 100 MG tablet Take 1 tablet (100 mg total) by mouth 2 (two) times daily.   empagliflozin (JARDIANCE) 10 MG TABS tablet Take 1 tablet (10 mg total) by mouth daily.   fluocinonide-emollient (LIDEX-E) 0.05 % cream Apply 1 Application topically 2 (two) times daily.   glucose blood (ACCU-CHEK GUIDE) test strip Use to check blood sugar daily. DX: E11.9   indapamide (LOZOL) 1.25 MG tablet TAKE 1 TABLET BY MOUTH EVERY DAY   rosuvastatin (CRESTOR) 20 MG tablet TAKE 1 TABLET(20 MG) BY MOUTH DAILY WITH BREAKFAST   terbinafine (LAMISIL) 250 MG tablet Take 1 tablet (250 mg total) by mouth daily.   No facility-administered encounter medications on file as of 07/27/2023.    Allergies (verified) Ciprofloxacin, Ofloxacin, and Other   History: Past Medical History:  Diagnosis Date   Anxiety    Chronic neck pain    with radiculopathy   Chronic pain of left wrist 2009   "since neck surgery"   Complication of anesthesia    Hard to wake up per pt; after neck sx had to be bagged.  DEPRESSION    DISC DISEASE, CERVICAL    Diverticulitis    HYPERLIPIDEMIA    Hypertension    Lymphocytosis 01/02/2016   NEPHROLITHIASIS, HX OF    NHL (non-Hodgkin's lymphoma) (HCC) 12/25/2014   Recurrent abdominal pain    Umbilical hernia    UTI'S, CHRONIC    Past Surgical History:  Procedure Laterality Date   ABDOMINAL HYSTERECTOMY  2001   APPENDECTOMY  1981   KIDNEY STONE SURGERY  2007   LEFT OOPHORECTOMY  2001   MYOMECTOMY  1981   NECK SURGERY  2009   fusion of C1, C2, C3   Family History  Problem Relation Age of Onset   Hypertension  Mother    Diabetes Mother    Hyperlipidemia Mother    Heart attack Father        MI   Hyperlipidemia Father    Hypertension Father    Diabetes Father    Heart disease Father    Cancer Neg Hx        Negative for Breast or Colon Cancer   Colon cancer Neg Hx    Social History   Socioeconomic History   Marital status: Single    Spouse name: Not on file   Number of children: 1   Years of education: HS[employer]   Highest education level: 12th grade  Occupational History   Occupation: Disabled  Tobacco Use   Smoking status: Former    Current packs/day: 0.00    Average packs/day: 0.9 packs/day for 30.0 years (27.0 ttl pk-yrs)    Types: Cigarettes    Start date: 12/17/1989    Quit date: 12/18/2019    Years since quitting: 3.6   Smokeless tobacco: Never  Vaping Use   Vaping status: Never Used  Substance and Sexual Activity   Alcohol use: No   Drug use: No   Sexual activity: Not Currently  Other Topics Concern   Not on file  Social History Narrative   HSG, [employer]-CNA (in process). [place]. Daughter in '91 [facility] [place]. Work-disability due to neck and arthritis problems. Lives-alone. No history of physical or sexual abuse   Social Determinants of Health   Financial Resource Strain: Medium Risk (07/27/2023)   Overall Financial Resource Strain (CARDIA)    Difficulty of Paying Living Expenses: Somewhat hard  Food Insecurity: Patient Declined (07/27/2023)   Hunger Vital Sign    Worried About Running Out of Food in the Last Year: Patient declined    Ran Out of Food in the Last Year: Patient declined  Transportation Needs: No Transportation Needs (07/27/2023)   PRAPARE - Administrator, Civil Service (Medical): No    Lack of Transportation (Non-Medical): No  Physical Activity: Insufficiently Active (07/27/2023)   Exercise Vital Sign    Days of Exercise per Week: 2 days    Minutes of Exercise per Session: 50 min  Stress: No Stress Concern Present (07/27/2023)    [employer] of Occupational Health - Occupational Stress Questionnaire    Feeling of Stress : Not at all  Social Connections: Unknown (07/27/2023)   Social Connection and Isolation Panel [NHANES]    Frequency of Communication with Friends and Family: More than three times a week    Frequency of Social Gatherings with Friends and Family: More than three times a week    Attends Religious Services: Patient declined    Database administrator or Organizations: Yes    Attends Banker Meetings: Patient declined    Marital Status:  Never married    Tobacco Counseling Counseling given: Not Answered   Clinical Intake:  Pre-visit preparation completed: Yes  Pain : 0-10 Pain Score: 5  Pain Type: Acute pain Pain Location: Buttocks Pain Descriptors / Indicators: Burning, Tender, Discomfort     BMI - recorded: 24.77 Nutritional Status: BMI of 19-24  Normal Nutritional Risks: None Diabetes: Yes CBG done?: No Did pt. bring in CBG monitor from home?: No  How often do you need to have someone help you when you read instructions, pamphlets, or other written materials from your doctor or pharmacy?: 1 - Never What is the last grade level you completed in school?: HSG  Interpreter Needed?: No  Information entered by :: Susie Cassette, LPN.   Activities of Daily Living    07/27/2023    2:27 PM 07/26/2023    9:52 AM  In your present state of health, do you have any difficulty performing the following activities:  Hearing? 0 0  Vision? 1 1  Difficulty concentrating or making decisions? 0 0  Walking or climbing stairs? 0 0  Dressing or bathing? 0 0  Doing errands, shopping? 0 0  Preparing Food and eating ? N N  Using the Toilet? N N  In the past six months, have you accidently leaked urine? N N  Do you have problems with loss of bowel control? N N  Managing your Medications? N N  Managing your Finances? N N  Housekeeping or managing your Housekeeping? N N     Patient Care Team: Etta Grandchild, MD as PCP - General (Internal Medicine) Artis Delay, MD as Consulting Physician (Hematology and Oncology) Glendale Chard, DO as Consulting Physician (Neurology) Josephine Igo, DO as Consulting Physician (Pulmonary Disease) Manning Charity, OD as Referring Physician (Optometry) Allena Katz Gillie Manners, DPM as Consulting Physician (Podiatry)  Indicate any recent Medical Services you may have received from other than [facility] providers in the past year (date may be approximate).     Assessment:   This is a routine wellness examination for Annel.  Hearing/Vision screen Hearing Screening - Comments:: Patient denied any hearing difficulty.   No hearing aids.  Vision Screening - Comments:: Patient does wear corrective lenses/contacts.  Annual eye exam done by: Manning Charity, OD.     Goals Addressed             This Visit's Progress    Client understands the importance of follow-up with providers by attending scheduled visits        Depression Screen    07/27/2023    2:23 PM 07/14/2023    1:49 PM 03/23/2022    2:11 PM 03/14/2020    6:02 PM 01/30/2020    4:00 PM 12/21/2018   10:21 AM 12/20/2018   11:07 AM  PHQ 2/9 Scores  PHQ - 2 Score 0 0 0 0 0 0 0  PHQ- 9 Score 4 4   0  0    Fall Risk    07/27/2023    2:27 PM 07/26/2023    9:52 AM 07/14/2023    1:49 PM 03/23/2022    2:04 PM 08/18/2021    9:23 AM  Fall Risk   Falls in the past year? 1 1 1 1  0  Number falls in past yr: 0 0 0 0   Injury with Fall? 0 0 0 0   Risk for fall due to : History of fall(s)  Other (Comment) History of fall(s);Orthopedic patient   Follow up Falls evaluation completed;Education  provided  Falls evaluation completed Falls prevention discussed     MEDICARE RISK AT HOME: Medicare Risk at Home Any stairs in or around the home?: Yes If so, are there any without handrails?: No Home free of loose throw rugs in walkways, pet beds, electrical cords, etc?: No Adequate lighting  in your home to reduce risk of falls?: Yes Life alert?: No Use of a cane, walker or w/c?: No Grab bars in the bathroom?: No Shower chair or bench in shower?: No Elevated toilet seat or a handicapped toilet?: No  TIMED UP AND GO:  Was the test performed?  No    Cognitive Function:  Normal cognitive status assessed by direct observation via telephone conversation by this Nurse Health Advisor. No abnormalities found.     07/27/2023    2:28 PM  MMSE - Mini Mental State Exam  Not completed: Unable to complete        07/27/2023    2:28 PM 03/23/2022    2:16 PM  6CIT Screen  What Year? 0 points 0 points  What month? 0 points 0 points  What time? 0 points 0 points  Count back from 20 0 points 0 points  Months in reverse 0 points 2 points  Repeat phrase 0 points 0 points  Total Score 0 points 2 points    Immunizations Immunization History  Administered Date(s) Administered   Fluad Quad(high Dose 65+) 08/18/2021   Influenza,inj,Quad PF,6+ Mos 07/11/2020   PFIZER(Purple Top)SARS-COV-2 Vaccination 11/26/2019, 12/20/2019, 09/24/2020   Pneumococcal Conjugate-13 10/13/2016   Pneumococcal Polysaccharide-23 07/11/2020   Tdap 08/24/2014    TDAP status: Up to date  Flu Vaccine status: Up to date  Pneumococcal vaccine status: Up to date  Covid-19 vaccine status: Completed vaccines  Qualifies for Shingles Vaccine? Yes   Zostavax completed No   Shingrix Completed?: No.    Education has been provided regarding the importance of this vaccine. Patient has been advised to call insurance company to determine out of pocket expense if they have not yet received this vaccine. Advised may also receive vaccine at local pharmacy or Health Dept. Verbalized acceptance and understanding.  Screening Tests Health Maintenance  Topic Date Due   Zoster Vaccines- Shingrix (1 of 2) Never done   DEXA SCAN  Never done   COVID-19 Vaccine (4 - 2023-24 season) 06/06/2023   INFLUENZA VACCINE   01/03/2024 (Originally 05/06/2023)   Lung Cancer Screening  10/16/2023   HEMOGLOBIN A1C  12/28/2023   FOOT EXAM  01/19/2024   MAMMOGRAM  04/21/2024   OPHTHALMOLOGY EXAM  05/19/2024   Diabetic kidney evaluation - eGFR measurement  06/29/2024   Diabetic kidney evaluation - Urine ACR  06/29/2024   Medicare Annual Wellness (AWV)  07/26/2024   DTaP/Tdap/Td (2 - Td or Tdap) 08/24/2024   Colonoscopy  11/28/2024   Pneumonia Vaccine 55+ Years old (3 of 3 - PPSV23 or PCV20) 07/11/2025   Hepatitis C Screening  Completed   HPV VACCINES  Aged Out    Health Maintenance  Health Maintenance Due  Topic Date Due   Zoster Vaccines- Shingrix (1 of 2) Never done   DEXA SCAN  Never done   COVID-19 Vaccine (4 - 2023-24 season) 06/06/2023    Colorectal cancer screening: Type of screening: Colonoscopy. Completed 11/28/2014. Repeat every 10 years  Mammogram status: Completed 04/21/2022. Repeat every year  Bone Density status: Ordered 06/30/2023. Pt provided with contact info and advised to call to schedule appt.  Lung Cancer Screening: (Low Dose CT Chest  recommended if Age 65-80 years, 20 pack-year currently smoking OR have quit w/in 15years.) does qualify.   Lung Cancer Screening Referral: due 10/16/2023  Additional Screening:  Hepatitis C Screening: does qualify; Completed 09/03/2014  Vision Screening: Recommended annual ophthalmology exams for early detection of glaucoma and other disorders of the eye. Is the patient up to date with their annual eye exam?  Yes  Who is the provider or what is the name of the office in which the patient attends annual eye exams? Manning Charity, OD. If pt is not established with a provider, would they like to be referred to a provider to establish care? No .   Dental Screening: Recommended annual dental exams for proper oral hygiene  Diabetic Foot Exam: Diabetic Foot Exam: Completed 01/19/2023  Community Resource Referral / Chronic Care Management: CRR required this  visit?  No   CCM required this visit?  No     Plan:     I have personally reviewed and noted the following in the patient's chart:   Medical and social history Use of alcohol, tobacco or illicit drugs  Current medications and supplements including opioid prescriptions. Patient is not currently taking opioid prescriptions. Functional ability and status Nutritional status Physical activity Advanced directives List of other physicians Hospitalizations, surgeries, and ER visits in previous 12 months Vitals Screenings to include cognitive, depression, and falls Referrals and appointments  In addition, I have reviewed and discussed with patient certain preventive protocols, quality metrics, and best practice recommendations. A written personalized care plan for preventive services as well as general preventive health recommendations were provided to patient.     Mickeal Needy, LPN   [id number]   After Visit Summary: (MyChart) Due to this being a telephonic visit, the after visit summary with patients personalized plan was offered to patient via MyChart   Nurse Notes: n/a

## 2023-07-27 NOTE — Patient Instructions (Signed)
Ms. Marczewski , Thank you for taking time to come for your Medicare Wellness Visit. I appreciate your ongoing commitment to your health goals. Please review the following plan we discussed and let me know if I can assist you in the future.   Referrals/Orders/Follow-Ups/Clinician Recommendations: No  This is a list of the screening recommended for you and due dates:  Health Maintenance  Topic Date Due   Zoster (Shingles) Vaccine (1 of 2) Never done   DEXA scan (bone density measurement)  Never done   COVID-19 Vaccine (4 - 2023-24 season) 06/06/2023   Flu Shot  01/03/2024*   Screening for Lung Cancer  10/16/2023   Hemoglobin A1C  12/28/2023   Complete foot exam   01/19/2024   Mammogram  04/21/2024   Eye exam for diabetics  05/19/2024   Yearly kidney function blood test for diabetes  06/29/2024   Yearly kidney health urinalysis for diabetes  06/29/2024   Medicare Annual Wellness Visit  07/26/2024   DTaP/Tdap/Td vaccine (2 - Td or Tdap) 08/24/2024   Colon Cancer Screening  11/28/2024   Pneumonia Vaccine (3 of 3 - PPSV23 or PCV20) 07/11/2025   Hepatitis C Screening  Completed   HPV Vaccine  Aged Out  *Topic was postponed. The date shown is not the original due date.    Advanced directives: (Copy Requested) Please bring a copy of your health care power of attorney and living will to the office to be added to your chart at your convenience.  Next Medicare Annual Wellness Visit scheduled for next year: No

## 2023-07-27 NOTE — Telephone Encounter (Signed)
Patient stated that she never heard back from Dr. Yetta Barre or his nurse regarding a new cream for her bottom that is is still red and irritated.  Please send something new to [facility]. The first rx did not work. CB#[phone]

## 2023-07-27 NOTE — Telephone Encounter (Signed)
Patient sent this message 10/09 "The cream that you prescribed for me isn't working. It has caused the dryness to become red and irritating. Is there another cream you can recommend."  Please advised the note below.

## 2023-07-29 NOTE — Telephone Encounter (Signed)
10/29 at 3pm patient is scheduled.

## 2023-08-03 ENCOUNTER — Encounter: Payer: Self-pay | Admitting: Internal Medicine

## 2023-08-03 ENCOUNTER — Ambulatory Visit: Payer: Medicare Other | Admitting: Internal Medicine

## 2023-08-03 VITALS — BP 156/90 | HR 81 | Temp 98.3°F | Ht 66.75 in | Wt 157.2 lb

## 2023-08-03 DIAGNOSIS — I10 Essential (primary) hypertension: Secondary | ICD-10-CM

## 2023-08-03 DIAGNOSIS — B356 Tinea cruris: Secondary | ICD-10-CM | POA: Diagnosis not present

## 2023-08-03 DIAGNOSIS — E2839 Other primary ovarian failure: Secondary | ICD-10-CM

## 2023-08-03 DIAGNOSIS — Z23 Encounter for immunization: Secondary | ICD-10-CM | POA: Insufficient documentation

## 2023-08-03 DIAGNOSIS — Z0001 Encounter for general adult medical examination with abnormal findings: Secondary | ICD-10-CM | POA: Diagnosis not present

## 2023-08-03 DIAGNOSIS — E785 Hyperlipidemia, unspecified: Secondary | ICD-10-CM

## 2023-08-03 LAB — LIPID PANEL
Cholesterol: 234 mg/dL — ABNORMAL HIGH (ref 0–200)
HDL: 60.9 mg/dL (ref >39.00)
LDL Cholesterol: 147 mg/dL — ABNORMAL HIGH (ref 0–99)
NonHDL: 173.34
Total CHOL/HDL Ratio: 4
Triglycerides: 133 mg/dL (ref 0.0–149.0)
VLDL: 26.6 mg/dL (ref 0.0–40.0)

## 2023-08-03 MED ORDER — ROSUVASTATIN CALCIUM 20 MG PO TABS: ORAL_TABLET | ORAL | 1 refills | Status: DC

## 2023-08-03 MED ORDER — AMLODIPINE BESYLATE 5 MG PO TABS: 5.0000 mg | ORAL_TABLET | Freq: Every day | ORAL | 0 refills | Status: DC

## 2023-08-03 MED ORDER — SHINGRIX 50 MCG/0.5ML IM SUSR: 0.5000 mL | Freq: Once | INTRAMUSCULAR | 1 refills | Status: AC

## 2023-08-03 MED ORDER — CICLOPIROX OLAMINE 0.77 % EX CREA: TOPICAL_CREAM | Freq: Two times a day (BID) | CUTANEOUS | 1 refills | Status: DC

## 2023-08-03 NOTE — Progress Notes (Signed)
Subjective:  Patient ID: Lauren Orozco, female    DOB: [birth year]  Age: 67 y.o. MRN: [id number]  CC: Annual Exam, Rash, Hypertension, and Hyperlipidemia   HPI Voilet Digrazia Harmes presents for a CPX and f/up ---  She continues to complain of a pruritus rash over left buttocks that has worsened despite topical steroids.  Her BP is not well controlled and she complains of intermittent headaches.  Outpatient Medications Prior to Visit  Medication Sig Dispense Refill   Accu-Chek Softclix Lancets lancets Use to check blood sugar daily. DX: E11.9 100 each 3   Blood Glucose Monitoring Suppl (ACCU-CHEK GUIDE ME) w/Device KIT 1 Act by Does not apply route in the morning and at bedtime. 1 kit 0   Cholecalciferol 50 MCG (2000 UT) TABS Take 2 tablets (4,000 Units total) by mouth daily. 180 tablet 1   empagliflozin (JARDIANCE) 10 MG TABS tablet Take 1 tablet (10 mg total) by mouth daily. 90 tablet 1   fluocinonide-emollient (LIDEX-E) 0.05 % cream Apply 1 Application topically 2 (two) times daily. 60 g 1   glucose blood (ACCU-CHEK GUIDE) test strip Use to check blood sugar daily. DX: E11.9 100 each 3   indapamide (LOZOL) 1.25 MG tablet TAKE 1 TABLET BY MOUTH EVERY DAY 90 tablet 0   terbinafine (LAMISIL) 250 MG tablet Take 1 tablet (250 mg total) by mouth daily. 42 tablet 0   doxycycline (VIBRA-TABS) 100 MG tablet Take 1 tablet (100 mg total) by mouth 2 (two) times daily. 20 tablet 0   rosuvastatin (CRESTOR) 20 MG tablet TAKE 1 TABLET(20 MG) BY MOUTH DAILY WITH BREAKFAST 90 tablet 1   No facility-administered medications prior to visit.    ROS Review of Systems  Constitutional:  Negative for diaphoresis and fatigue.  HENT: Negative.  Negative for sinus pressure.   Eyes: Negative.   Respiratory: Negative.  Negative for cough, chest tightness, shortness of breath and wheezing.   Cardiovascular:  Negative for chest pain, palpitations and leg swelling.  Gastrointestinal: Negative.   Negative for abdominal pain, constipation, diarrhea, nausea and vomiting.  Genitourinary: Negative.  Negative for difficulty urinating and hematuria.  Musculoskeletal: Negative.  Negative for arthralgias and myalgias.  Skin:  Positive for rash.  Neurological:  Positive for headaches. Negative for dizziness, weakness and light-headedness.  Hematological:  Negative for adenopathy. Does not bruise/bleed easily.  Psychiatric/Behavioral: Negative.      Objective:  BP (!) 156/90 (BP Location: Left Arm, Patient Position: Sitting, Cuff Size: Normal)   Pulse 81   Temp 98.3 F (36.8 C) (Oral)   Ht 5' 6.75" (1.695 m)   Wt 157 lb 3.2 oz (71.3 kg)   SpO2 97%   BMI 24.81 kg/m   BP Readings from Last 3 Encounters:  08/03/23 (!) 156/90  07/14/23 (!) 150/86  06/30/23 138/80    Wt Readings from Last 3 Encounters:  08/03/23 157 lb 3.2 oz (71.3 kg)  07/27/23 157 lb (71.2 kg)  07/14/23 157 lb 3.2 oz (71.3 kg)    Physical Exam Vitals reviewed.  Constitutional:      Appearance: Normal appearance.  HENT:     Mouth/Throat:     Mouth: Mucous membranes are moist.  Eyes:     General: No scleral icterus.    Conjunctiva/sclera: Conjunctivae normal.  Cardiovascular:     Rate and Rhythm: Normal rate and regular rhythm.     Heart sounds: No murmur heard. Pulmonary:     Effort: Pulmonary effort is normal.  Breath sounds: No stridor. No wheezing, rhonchi or rales.  Abdominal:     General: Abdomen is flat.     Palpations: There is no mass.     Tenderness: There is no abdominal tenderness. There is no guarding.     Hernia: No hernia is present.  Musculoskeletal:        General: Normal range of motion.     Cervical back: Neck supple.     Right lower leg: No edema.     Left lower leg: No edema.  Lymphadenopathy:     Cervical: No cervical adenopathy.  Skin:    Findings: Rash present. No lesion.  Neurological:     General: No focal deficit present.     Mental Status: She is alert and  oriented to person, place, and time. Mental status is at baseline.  Psychiatric:        Mood and Affect: Mood normal.        Behavior: Behavior normal.     Lab Results  Component Value Date   WBC 6.1 04/27/2023   HGB 13.3 04/27/2023   HCT 38.9 04/27/2023   PLT 388 04/27/2023   GLUCOSE 113 (H) 06/30/2023   CHOL 234 (H) 08/03/2023   TRIG 133.0 08/03/2023   HDL 60.90 08/03/2023   LDLDIRECT 150.8 10/19/2011   LDLCALC 147 (H) 08/03/2023   ALT 12 04/27/2023   AST 13 (L) 04/27/2023   NA 139 06/30/2023   K 3.9 06/30/2023   CL 101 06/30/2023   CREATININE 0.85 06/30/2023   BUN 12 06/30/2023   CO2 31 06/30/2023   TSH 0.58 06/30/2023   HGBA1C 6.6 (H) 06/30/2023   MICROALBUR 0.7 06/30/2023    CT Super D Chest Wo Contrast  Result Date: 10/16/2022 CLINICAL DATA:  Follow-up nodules EXAM: CT CHEST WITHOUT CONTRAST TECHNIQUE: Multidetector CT imaging of the chest was performed using thin slice collimation for electromagnetic bronchoscopy planning purposes, without intravenous contrast. RADIATION DOSE REDUCTION: This exam was performed according to the departmental dose-optimization program which includes automated exposure control, adjustment of the mA and/or kV according to patient size and/or use of iterative reconstruction technique. COMPARISON:  Lung cancer screening CT dated April 21, 2022 FINDINGS: Cardiovascular: Normal heart size. No pericardial effusion. Normal caliber thoracic aorta with moderate calcified plaque. Moderate coronary artery calcifications. Mediastinum/Nodes: Moderate coronary artery calcifications. Esophagus and thyroid are unremarkable no pathologically enlarged lymph nodes seen in the chest. Lungs/Pleura: Central airways are patent. No consolidation, pleural effusion or pneumothorax. Mild centrilobular emphysema. Pulmonary nodules which were new on prior lung cancer screening CT have resolved. Other previously seen small solid pulmonary nodules are stable. Reference solid  pulmonary nodule of the left lower lobe measuring 4 mm on series 4, image 105. Upper Abdomen: Stable benign right adenoma, no specific follow-up imaging is recommended. Musculoskeletal: No chest wall mass or suspicious bone lesions identified. IMPRESSION: 1. Pulmonary nodules which were new on prior lung cancer screening CT have resolved. Other previously seen small solid pulmonary nodules are stable. Recommend return to annual lung cancer screening. 2. Coronary artery calcifications, aortic Atherosclerosis (ICD10-I70.0) and Emphysema (ICD10-J43.9). Electronically Signed   By: Allegra Lai M.D.   On: 10/16/2022 11:27    Assessment & Plan:  Tinea cruris -     Ciclopirox Olamine; Apply topically 2 (two) times daily.  Dispense: 90 g; Refill: 1  Need for immunization against influenza -     Flu Vaccine Trivalent High Dose (Fluad)  Hyperlipidemia with target LDL less than 130- She  has not achieved her LDL goal.  Will restart the statin.  Will risk stratify with a CCS. -     Lipid panel; Future -     CT CARDIAC SCORING (SELF PAY ONLY); Future -     Rosuvastatin Calcium; TAKE 1 TABLET(20 MG) BY MOUTH DAILY WITH BREAKFAST  Dispense: 90 tablet; Refill: 1  Encounter for general adult medical examination with abnormal findings- Exam completed, labs reviewed, vaccines reviewed and updated, cancer screenings are UTD, pt ed material was given.   Essential hypertension- Her blood pressure is not adequately well-controlled and she is symptomatic.  Will add amlodipine. -     amLODIPine Besylate; Take 1 tablet (5 mg total) by mouth daily.  Dispense: 90 tablet; Refill: 0  Estrogen deficiency -     DG Bone Density; Future  Need for prophylactic vaccination and inoculation against varicella -     Shingrix; Inject 0.5 mLs into the muscle once for 1 dose.  Dispense: 0.5 mL; Refill: 1     Follow-up: Return in about 6 months (around 02/01/2024).  Sanda Linger, MD

## 2023-08-03 NOTE — Patient Instructions (Signed)

## 2023-08-04 ENCOUNTER — Telehealth: Payer: Self-pay | Admitting: Internal Medicine

## 2023-08-04 NOTE — Telephone Encounter (Signed)
Please advise and send back to me so I can call the patient back.

## 2023-08-04 NOTE — Telephone Encounter (Signed)
Pt called wanting to know are she suppose to be taking both Bp pressure medications. Please reach back out to pt with a response.

## 2023-08-05 NOTE — Addendum Note (Signed)
Addended by: Etta Grandchild on: 08/05/2023 05:19 PM   Modules accepted: Orders

## 2023-08-08 ENCOUNTER — Other Ambulatory Visit: Payer: Self-pay | Admitting: Internal Medicine

## 2023-08-08 DIAGNOSIS — I10 Essential (primary) hypertension: Secondary | ICD-10-CM

## 2023-08-10 ENCOUNTER — Telehealth: Payer: Self-pay

## 2023-08-10 NOTE — Progress Notes (Signed)
   Care Guide Note  08/10/2023 Name: [name] MRN: [id number] DOB: 01/28/1956  Referred by: Etta Grandchild, MD Reason for referral : Care Coordination (Outreach to schedule with Pharm d )   Lauren Orozco is a 66 y.o. year old female who is a primary care patient of Etta Grandchild, MD. Shelly Rubenstein Enns was referred to the pharmacist for assistance related to HTN.    Successful contact was made with the patient to discuss pharmacy services including being ready for the pharmacist to call at least 5 minutes before the scheduled appointment time, to have medication bottles and any blood sugar or blood pressure readings ready for review. The patient agreed to meet with the pharmacist via with the pharmacist via telephone visit on (date/time).  08/16/2023  Penne Lash, RMA Care Guide [facility]  [place], [place] [address] Direct Dial: [phone] [email or web address]

## 2023-08-16 ENCOUNTER — Other Ambulatory Visit (INDEPENDENT_AMBULATORY_CARE_PROVIDER_SITE_OTHER): Payer: Medicare Other | Admitting: Pharmacist

## 2023-08-16 VITALS — BP 105/62

## 2023-08-16 DIAGNOSIS — I10 Essential (primary) hypertension: Secondary | ICD-10-CM

## 2023-08-16 NOTE — Patient Instructions (Signed)
It was a pleasure speaking with you today!  Continue your current medication regimen and keep monitoring your blood pressure. Let us know if you are noticing frequent numbers above 130/80 or if you are having low blood pressures with symptoms such as dizziness or weakness.  Feel free to call with any questions or concerns!  Arbutus Leas, PharmD, BCPS [facility] Clinical Pharmacist [facility] [phone]

## 2023-08-16 NOTE — Progress Notes (Signed)
08/16/2023 Name: Lauren Orozco MRN: [id number] DOB: [birth year]  Chief Complaint  Patient presents with   Hypertension   Medication Management    Lauren Orozco is a 67 y.o. year old female who presented for a telephone visit.   They were referred to the pharmacist by their PCP for assistance in managing hypertension.    Subjective:  Care Team: Primary Care Provider: Etta Grandchild, MD ; Next Scheduled Visit: not scheduled   Medication Access/Adherence  Current Pharmacy:  [facility] [phone] - [place], [place] - [address] AT [facility] [address] [place] [address] Phone: [phone] Fax: [phone]   Patient reports affordability concerns with their medications: No  Patient reports access/transportation concerns to their pharmacy: No  Patient reports adherence concerns with their medications:  No     Hypertension:  Current medications: indapamide 1.25 mg daily, amlodipine 5 mg daily Medications previously tried:   Patient has a validated, automated, upper arm home BP cuff Current blood pressure readings: 128/79, 105/62 11/10, 119/75, 133/84, 117/70. Previously 140-150s before amlodipine  Patient denies hypotensive s/sx including dizziness, lightheadedness.  Patient hypertensive symptoms including headache, chest pain, shortness of breath   Current physical activity: walks 3 miles daily   Objective:  Lab Results  Component Value Date   HGBA1C 6.6 (H) 06/30/2023    Lab Results  Component Value Date   CREATININE 0.85 06/30/2023   BUN 12 06/30/2023   NA 139 06/30/2023   K 3.9 06/30/2023   CL 101 06/30/2023   CO2 31 06/30/2023    Lab Results  Component Value Date   CHOL 234 (H) 08/03/2023   HDL 60.90 08/03/2023   LDLCALC 147 (H) 08/03/2023   LDLDIRECT 150.8 10/19/2011   TRIG 133.0 08/03/2023   CHOLHDL 4 08/03/2023    Medications Reviewed Today     Reviewed by Bonita Quin, RPH (Pharmacist) on 08/16/23 at 1433  Med List Status: <None>   Medication Order Taking? Sig Documenting Provider Last Dose Status Informant  Accu-Chek Softclix Lancets lancets [id number]  Use to check blood sugar daily. DX: E11.9 Etta Grandchild, MD  Active   amLODipine (NORVASC) 5 MG tablet [id number] Yes Take 1 tablet (5 mg total) by mouth daily. Etta Grandchild, MD Taking Active   Blood Glucose Monitoring Suppl (ACCU-CHEK GUIDE ME) w/Device KIT [id number]  1 Act by Does not apply route in the morning and at bedtime. Etta Grandchild, MD  Active   Cholecalciferol 50 MCG (2000 UT) TABS [id number] Yes Take 2 tablets (4,000 Units total) by mouth daily. Etta Grandchild, MD Taking Active   ciclopirox (LOPROX) 0.77 % cream [id number]  Apply topically 2 (two) times daily. Etta Grandchild, MD  Active   fluocinonide-emollient (LIDEX-E) 0.05 % cream [id number]  Apply 1 Application topically 2 (two) times daily. Etta Grandchild, MD  Active   glucose blood (ACCU-CHEK GUIDE) test strip [id number]  Use to check blood sugar daily. DX: E11.9 Etta Grandchild, MD  Active   indapamide (LOZOL) 1.25 MG tablet [id number] Yes TAKE 1 TABLET BY MOUTH EVERY DAY Etta Grandchild, MD Taking Active   rosuvastatin (CRESTOR) 20 MG tablet [id number] Yes TAKE 1 TABLET(20 MG) BY MOUTH DAILY WITH BREAKFAST Etta Grandchild, MD Taking Active               Assessment/Plan:   Hypertension: - Currently controlled, BP goal <130/80 - Reviewed long term cardiovascular  and renal outcomes of uncontrolled blood pressure - Reviewed diet/exercise recommendations - Recommend to continue current regimen.     Follow Up Plan: 3 months, 12/9  Arbutus Leas, PharmD, BCPS Clinical Pharmacist [facility] [phone]

## 2023-08-18 ENCOUNTER — Other Ambulatory Visit: Payer: Self-pay | Admitting: Internal Medicine

## 2023-08-18 DIAGNOSIS — E2839 Other primary ovarian failure: Secondary | ICD-10-CM

## 2023-09-06 ENCOUNTER — Ambulatory Visit: Payer: Medicare Other | Admitting: Internal Medicine

## 2023-09-06 ENCOUNTER — Encounter: Payer: Self-pay | Admitting: Internal Medicine

## 2023-09-06 ENCOUNTER — Ambulatory Visit (INDEPENDENT_AMBULATORY_CARE_PROVIDER_SITE_OTHER): Payer: Medicare Other

## 2023-09-06 VITALS — BP 138/80 | HR 83 | Temp 98.2°F | Ht 66.75 in | Wt 157.2 lb

## 2023-09-06 DIAGNOSIS — M542 Cervicalgia: Secondary | ICD-10-CM

## 2023-09-06 DIAGNOSIS — M79621 Pain in right upper arm: Secondary | ICD-10-CM | POA: Diagnosis not present

## 2023-09-06 DIAGNOSIS — E2839 Other primary ovarian failure: Secondary | ICD-10-CM

## 2023-09-06 DIAGNOSIS — I10 Essential (primary) hypertension: Secondary | ICD-10-CM

## 2023-09-06 DIAGNOSIS — S12300A Unspecified displaced fracture of fourth cervical vertebra, initial encounter for closed fracture: Secondary | ICD-10-CM | POA: Diagnosis not present

## 2023-09-06 DIAGNOSIS — S12600A Unspecified displaced fracture of seventh cervical vertebra, initial encounter for closed fracture: Secondary | ICD-10-CM | POA: Diagnosis not present

## 2023-09-06 DIAGNOSIS — S12200A Unspecified displaced fracture of third cervical vertebra, initial encounter for closed fracture: Secondary | ICD-10-CM | POA: Diagnosis not present

## 2023-09-06 DIAGNOSIS — S12400A Unspecified displaced fracture of fifth cervical vertebra, initial encounter for closed fracture: Secondary | ICD-10-CM | POA: Diagnosis not present

## 2023-09-06 DIAGNOSIS — M5030 Other cervical disc degeneration, unspecified cervical region: Secondary | ICD-10-CM

## 2023-09-06 NOTE — Patient Instructions (Signed)

## 2023-09-06 NOTE — Progress Notes (Unsigned)
Subjective:  Patient ID: Lauren Orozco, female    DOB: March 19, 1956  Age: 67 y.o. MRN: [id number]  CC: Hypertension   HPI Lauren Orozco presents for f/up ----  Discussed the use of AI scribe software for clinical note transcription with the patient, who gave verbal consent to proceed.  History of Present Illness   The patient, with a history of neck surgery, presents with persistent neck pain and nightly numbness, weakness, and tingling in the arms and legs. The patient reports that the numbness is particularly severe in one leg. The patient denies any weakness in the arms, but describes a constant pain radiating down the arm, which has worsened since receiving a flu shot six weeks ago. The patient has been managing the pain with a heat pack and over-the-counter Tylenol, but reports difficulty sleeping due to the discomfort.  The patient also reports frequent urination and episodes of lightheadedness and dizziness. However, she denies experiencing any chest pain or shortness of breath. The patient has a palpable lymphoma, but reports that no action has been taken to address it. The patient expresses frustration with the persistent pain and discomfort, and the impact it has on her daily activities.       Outpatient Medications Prior to Visit  Medication Sig Dispense Refill   Accu-Chek Softclix Lancets lancets Use to check blood sugar daily. DX: E11.9 100 each 3   amLODipine (NORVASC) 5 MG tablet Take 1 tablet (5 mg total) by mouth daily. 90 tablet 0   Blood Glucose Monitoring Suppl (ACCU-CHEK GUIDE ME) w/Device KIT 1 Act by Does not apply route in the morning and at bedtime. 1 kit 0   Cholecalciferol 50 MCG (2000 UT) TABS Take 2 tablets (4,000 Units total) by mouth daily. 180 tablet 1   ciclopirox (LOPROX) 0.77 % cream Apply topically 2 (two) times daily. 90 g 1   fluocinonide-emollient (LIDEX-E) 0.05 % cream Apply 1 Application topically 2 (two) times daily. 60 g 1    glucose blood (ACCU-CHEK GUIDE) test strip Use to check blood sugar daily. DX: E11.9 100 each 3   indapamide (LOZOL) 1.25 MG tablet TAKE 1 TABLET BY MOUTH EVERY DAY 90 tablet 0   rosuvastatin (CRESTOR) 20 MG tablet TAKE 1 TABLET(20 MG) BY MOUTH DAILY WITH BREAKFAST 90 tablet 1   No facility-administered medications prior to visit.    ROS Review of Systems  Objective:  BP 138/80 (BP Location: Left Arm, Patient Position: Sitting, Cuff Size: Normal)   Pulse 83   Temp 98.2 F (36.8 C) (Oral)   Ht 5' 6.75" (1.695 m)   Wt 157 lb 3.2 oz (71.3 kg)   SpO2 98%   BMI 24.81 kg/m   BP Readings from Last 3 Encounters:  09/06/23 138/80  08/15/23 105/62  08/03/23 (!) 156/90    Wt Readings from Last 3 Encounters:  09/06/23 157 lb 3.2 oz (71.3 kg)  08/03/23 157 lb 3.2 oz (71.3 kg)  07/27/23 157 lb (71.2 kg)    Physical Exam  Lab Results  Component Value Date   WBC 6.1 04/27/2023   HGB 13.3 04/27/2023   HCT 38.9 04/27/2023   PLT 388 04/27/2023   GLUCOSE 113 (H) 06/30/2023   CHOL 234 (H) 08/03/2023   TRIG 133.0 08/03/2023   HDL 60.90 08/03/2023   LDLDIRECT 150.8 10/19/2011   LDLCALC 147 (H) 08/03/2023   ALT 12 04/27/2023   AST 13 (L) 04/27/2023   NA 139 06/30/2023   K 3.9 06/30/2023  CL 101 06/30/2023   CREATININE 0.85 06/30/2023   BUN 12 06/30/2023   CO2 31 06/30/2023   TSH 0.58 06/30/2023   HGBA1C 6.6 (H) 06/30/2023   MICROALBUR 0.7 06/30/2023    CT Super D Chest Wo Contrast  Result Date: 10/16/2022 CLINICAL DATA:  Follow-up nodules EXAM: CT CHEST WITHOUT CONTRAST TECHNIQUE: Multidetector CT imaging of the chest was performed using thin slice collimation for electromagnetic bronchoscopy planning purposes, without intravenous contrast. RADIATION DOSE REDUCTION: This exam was performed according to the departmental dose-optimization program which includes automated exposure control, adjustment of the mA and/or kV according to patient size and/or use of iterative  reconstruction technique. COMPARISON:  Lung cancer screening CT dated April 21, 2022 FINDINGS: Cardiovascular: Normal heart size. No pericardial effusion. Normal caliber thoracic aorta with moderate calcified plaque. Moderate coronary artery calcifications. Mediastinum/Nodes: Moderate coronary artery calcifications. Esophagus and thyroid are unremarkable no pathologically enlarged lymph nodes seen in the chest. Lungs/Pleura: Central airways are patent. No consolidation, pleural effusion or pneumothorax. Mild centrilobular emphysema. Pulmonary nodules which were new on prior lung cancer screening CT have resolved. Other previously seen small solid pulmonary nodules are stable. Reference solid pulmonary nodule of the left lower lobe measuring 4 mm on series 4, image 105. Upper Abdomen: Stable benign right adenoma, no specific follow-up imaging is recommended. Musculoskeletal: No chest wall mass or suspicious bone lesions identified. IMPRESSION: 1. Pulmonary nodules which were new on prior lung cancer screening CT have resolved. Other previously seen small solid pulmonary nodules are stable. Recommend return to annual lung cancer screening. 2. Coronary artery calcifications, aortic Atherosclerosis (ICD10-I70.0) and Emphysema (ICD10-J43.9). Electronically Signed   By: Allegra Lai M.D.   On: 10/16/2022 11:27    Assessment & Plan:  Neck pain on right side -     DG Cervical Spine Complete; Future -     Ambulatory referral to Neurosurgery  Pain in right upper arm -     DG Humerus Right; Future  DISC DISEASE, CERVICAL -     Ambulatory referral to Neurosurgery     Follow-up: Return in about 3 months (around 12/05/2023).  Sanda Linger, MD

## 2023-09-07 NOTE — Progress Notes (Unsigned)
Subjective:  Patient ID: Lauren Orozco, female    DOB: December 30, 1955  Age: 67 y.o. MRN: [id number]  CC: No chief complaint on file.   HPI Lauren Orozco presents for ***  Outpatient Medications Prior to Visit  Medication Sig Dispense Refill   Accu-Chek Softclix Lancets lancets Use to check blood sugar daily. DX: E11.9 100 each 3   amLODipine (NORVASC) 5 MG tablet Take 1 tablet (5 mg total) by mouth daily. 90 tablet 0   Blood Glucose Monitoring Suppl (ACCU-CHEK GUIDE ME) w/Device KIT 1 Act by Does not apply route in the morning and at bedtime. 1 kit 0   Cholecalciferol 50 MCG (2000 UT) TABS Take 2 tablets (4,000 Units total) by mouth daily. 180 tablet 1   ciclopirox (LOPROX) 0.77 % cream Apply topically 2 (two) times daily. 90 g 1   fluocinonide-emollient (LIDEX-E) 0.05 % cream Apply 1 Application topically 2 (two) times daily. 60 g 1   glucose blood (ACCU-CHEK GUIDE) test strip Use to check blood sugar daily. DX: E11.9 100 each 3   indapamide (LOZOL) 1.25 MG tablet TAKE 1 TABLET BY MOUTH EVERY DAY 90 tablet 0   rosuvastatin (CRESTOR) 20 MG tablet TAKE 1 TABLET(20 MG) BY MOUTH DAILY WITH BREAKFAST 90 tablet 1   No facility-administered medications prior to visit.    ROS Review of Systems  Objective:  There were no vitals taken for this visit.  BP Readings from Last 3 Encounters:  09/06/23 138/80  08/15/23 105/62  08/03/23 (!) 156/90    Wt Readings from Last 3 Encounters:  09/06/23 157 lb 3.2 oz (71.3 kg)  08/03/23 157 lb 3.2 oz (71.3 kg)  07/27/23 157 lb (71.2 kg)    Physical Exam  Lab Results  Component Value Date   WBC 6.1 04/27/2023   HGB 13.3 04/27/2023   HCT 38.9 04/27/2023   PLT 388 04/27/2023   GLUCOSE 113 (H) 06/30/2023   CHOL 234 (H) 08/03/2023   TRIG 133.0 08/03/2023   HDL 60.90 08/03/2023   LDLDIRECT 150.8 10/19/2011   LDLCALC 147 (H) 08/03/2023   ALT 12 04/27/2023   AST 13 (L) 04/27/2023   NA 139 06/30/2023   K 3.9 06/30/2023    CL 101 06/30/2023   CREATININE 0.85 06/30/2023   BUN 12 06/30/2023   CO2 31 06/30/2023   TSH 0.58 06/30/2023   HGBA1C 6.6 (H) 06/30/2023   MICROALBUR 0.7 06/30/2023    CT Super D Chest Wo Contrast  Result Date: 10/16/2022 CLINICAL DATA:  Follow-up nodules EXAM: CT CHEST WITHOUT CONTRAST TECHNIQUE: Multidetector CT imaging of the chest was performed using thin slice collimation for electromagnetic bronchoscopy planning purposes, without intravenous contrast. RADIATION DOSE REDUCTION: This exam was performed according to the departmental dose-optimization program which includes automated exposure control, adjustment of the mA and/or kV according to patient size and/or use of iterative reconstruction technique. COMPARISON:  Lung cancer screening CT dated April 21, 2022 FINDINGS: Cardiovascular: Normal heart size. No pericardial effusion. Normal caliber thoracic aorta with moderate calcified plaque. Moderate coronary artery calcifications. Mediastinum/Nodes: Moderate coronary artery calcifications. Esophagus and thyroid are unremarkable no pathologically enlarged lymph nodes seen in the chest. Lungs/Pleura: Central airways are patent. No consolidation, pleural effusion or pneumothorax. Mild centrilobular emphysema. Pulmonary nodules which were new on prior lung cancer screening CT have resolved. Other previously seen small solid pulmonary nodules are stable. Reference solid pulmonary nodule of the left lower lobe measuring 4 mm on series 4, image 105. Upper Abdomen: Stable benign  right adenoma, no specific follow-up imaging is recommended. Musculoskeletal: No chest wall mass or suspicious bone lesions identified. IMPRESSION: 1. Pulmonary nodules which were new on prior lung cancer screening CT have resolved. Other previously seen small solid pulmonary nodules are stable. Recommend return to annual lung cancer screening. 2. Coronary artery calcifications, aortic Atherosclerosis (ICD10-I70.0) and Emphysema  (ICD10-J43.9). Electronically Signed   By: Allegra Lai M.D.   On: 10/16/2022 11:27    Assessment & Plan:  Neck pain on right side -     DG Cervical Spine Complete  Pain in right upper arm -     DG Humerus Right     Follow-up: No follow-ups on file.  Lauren Linger, MD

## 2023-10-13 DIAGNOSIS — M47812 Spondylosis without myelopathy or radiculopathy, cervical region: Secondary | ICD-10-CM | POA: Diagnosis not present

## 2023-10-19 ENCOUNTER — Other Ambulatory Visit: Payer: Self-pay | Admitting: *Deleted

## 2023-10-19 DIAGNOSIS — Z87891 Personal history of nicotine dependence: Secondary | ICD-10-CM

## 2023-10-19 DIAGNOSIS — Z122 Encounter for screening for malignant neoplasm of respiratory organs: Secondary | ICD-10-CM

## 2023-10-30 ENCOUNTER — Other Ambulatory Visit: Payer: Self-pay | Admitting: Internal Medicine

## 2023-10-30 DIAGNOSIS — I10 Essential (primary) hypertension: Secondary | ICD-10-CM

## 2023-11-03 ENCOUNTER — Ambulatory Visit (HOSPITAL_COMMUNITY): Payer: Medicare Other

## 2023-11-10 ENCOUNTER — Other Ambulatory Visit: Payer: Self-pay | Admitting: Internal Medicine

## 2023-11-10 DIAGNOSIS — I10 Essential (primary) hypertension: Secondary | ICD-10-CM

## 2023-11-12 ENCOUNTER — Other Ambulatory Visit: Payer: Self-pay | Admitting: Internal Medicine

## 2023-11-12 DIAGNOSIS — I10 Essential (primary) hypertension: Secondary | ICD-10-CM

## 2023-11-16 ENCOUNTER — Other Ambulatory Visit: Payer: Medicare Other | Admitting: Pharmacist

## 2023-11-16 VITALS — BP 127/73 | HR 80

## 2023-11-16 DIAGNOSIS — I10 Essential (primary) hypertension: Secondary | ICD-10-CM

## 2023-11-16 NOTE — Progress Notes (Signed)
11/16/2023 Name: Aleeya Veitch MRN: [id number] DOB: 08/11/1956  Chief Complaint  Patient presents with   Hypertension   Medication Management    Shariece Viveiros Arenson is a 68 y.o. year old female who presented for a telephone visit.   They were referred to the pharmacist by their PCP for assistance in managing hypertension.    Subjective:  Care Team: Primary Care Provider: Etta Grandchild, MD ; Next Scheduled Visit: not scheduled   Medication Access/Adherence  Current Pharmacy:  [facility] [phone] - [place], [place] - [address] AT [facility] [address] [place] [address] Phone: [phone] Fax: [phone]  [id number] [place], [place] - [address] AT [facility] OF [facility] & [address] [place] [address] Phone: [phone] Fax: [phone]   Patient reports affordability concerns with their medications: No  Patient reports access/transportation concerns to their pharmacy: No  Patient reports adherence concerns with their medications:  No     Hypertension:  Current medications: indapamide 1.25 mg daily, amlodipine 5 mg daily Medications previously tried:   Patient has a validated, automated, upper arm home BP cuff Current blood pressure readings: 127/73 (80) while on phone. She notes it has been 110-120s/70s consistently at home Previously 140-150s before amlodipine  Patient denies hypotensive s/sx including dizziness, lightheadedness.  Patient hypertensive symptoms including headache, denies chest pain, shortness of breath  Pt reports that she feels "foggy" but does not feel it is related to her blood pressure. She notes that she had a fall at church about a month ago, she hit her head. She states she feels foggy and has had headaches since. She reports she has seen PCP since falling, however do not see an appt in the last month.   Current  physical activity: walks 3 miles daily   Pt reports rash that has not improved with last steroid cream prescribed and antifungal cream.  Objective:  Lab Results  Component Value Date   HGBA1C 6.6 (H) 06/30/2023    Lab Results  Component Value Date   CREATININE 0.85 06/30/2023   BUN 12 06/30/2023   NA 139 06/30/2023   K 3.9 06/30/2023   CL 101 06/30/2023   CO2 31 06/30/2023    Lab Results  Component Value Date   CHOL 234 (H) 08/03/2023   HDL 60.90 08/03/2023   LDLCALC 147 (H) 08/03/2023   LDLDIRECT 150.8 10/19/2011   TRIG 133.0 08/03/2023   CHOLHDL 4 08/03/2023    Medications Reviewed Today     Reviewed by Bonita Quin, RPH (Pharmacist) on 11/16/23 at 1317  Med List Status: <None>   Medication Order Taking? Sig Documenting Provider Last Dose Status Informant  Accu-Chek Softclix Lancets lancets [id number]  Use to check blood sugar daily. DX: E11.9 Etta Grandchild, MD  Active   amLODipine (NORVASC) 5 MG tablet [id number] Yes TAKE 1 TABLET(5 MG) BY MOUTH DAILY Etta Grandchild, MD Taking Active   Blood Glucose Monitoring Suppl (ACCU-CHEK GUIDE ME) w/Device KIT [id number]  1 Act by Does not apply route in the morning and at bedtime. Etta Grandchild, MD  Active   Cholecalciferol 50 MCG (2000 UT) TABS [id number] Yes Take 2 tablets (4,000 Units total) by mouth daily. Etta Grandchild, MD Taking Active   ciclopirox (LOPROX) 0.77 % cream [id number]  Apply topically 2 (two) times daily. Etta Grandchild, MD  Active  fluocinonide-emollient (LIDEX-E) 0.05 % cream [id number]  Apply 1 Application topically 2 (two) times daily. Etta Grandchild, MD  Active   glucose blood (ACCU-CHEK GUIDE) test strip [id number]  Use to check blood sugar daily. DX: E11.9 Etta Grandchild, MD  Active   indapamide (LOZOL) 1.25 MG tablet [id number] Yes TAKE 1 TABLET BY MOUTH EVERY DAY Etta Grandchild, MD Taking Active   rosuvastatin (CRESTOR) 20 MG tablet [id number] Yes TAKE 1 TABLET(20 MG) BY MOUTH DAILY WITH  BREAKFAST Etta Grandchild, MD Taking Active               Assessment/Plan:   Hypertension: - Currently controlled, BP goal <130/80 - Reviewed long term cardiovascular and renal outcomes of uncontrolled blood pressure - Reviewed diet/exercise recommendations - Recommend to continue current regimen.   Other: - recommended pt schedule appt with PCP to evaluate for headache/fog symptoms and to re-evaluate rash    Follow Up Plan: PRN  Arbutus Leas, PharmD, BCPS, CPP Clinical Pharmacist Practitioner [facility] [phone]

## 2023-11-16 NOTE — Patient Instructions (Signed)
It was a pleasure speaking with you today!  Call the office to set up a follow up with Dr. Yetta Barre.  Feel free to call with any questions or concerns!  Arbutus Leas, PharmD, BCPS, CPP Clinical Pharmacist Practitioner [facility] [phone]

## 2023-12-27 ENCOUNTER — Encounter: Payer: Self-pay | Admitting: Family Medicine

## 2023-12-27 ENCOUNTER — Ambulatory Visit (INDEPENDENT_AMBULATORY_CARE_PROVIDER_SITE_OTHER): Admitting: Family Medicine

## 2023-12-27 VITALS — BP 150/78 | HR 87 | Temp 98.5°F | Ht 66.75 in | Wt 156.0 lb

## 2023-12-27 DIAGNOSIS — B356 Tinea cruris: Secondary | ICD-10-CM

## 2023-12-27 DIAGNOSIS — R21 Rash and other nonspecific skin eruption: Secondary | ICD-10-CM

## 2023-12-27 MED ORDER — CLOTRIMAZOLE-BETAMETHASONE 1-0.05 % EX CREA: 1.0000 | TOPICAL_CREAM | Freq: Every day | CUTANEOUS | 0 refills | Status: DC

## 2023-12-27 NOTE — Patient Instructions (Signed)
 I have sent in lotrisone cream for you to use to the area twice a day.  Follow up as needed.

## 2023-12-27 NOTE — Progress Notes (Signed)
   Acute Office Visit  Subjective:     Patient ID: Lauren Orozco, female    DOB: 08-28-1956, 68 y.o.   MRN: [id number]  Chief Complaint  Patient presents with   Acute Visit    HPI Patient is in today for evaluation of rash to the inner thigh creases for the last few months.  Has tried ciclopirox with little relief.  Denies discharge, bleeding from the area.  Reports that she has been scratching.  Denies other concerns today.  Medical hx as outlined below.    ROS Per HPI      Objective:    BP (!) 150/78 (BP Location: Left Arm, Patient Position: Sitting)   Pulse 87   Temp 98.5 F (36.9 C) (Temporal)   Ht 5' 6.75" (1.695 m)   Wt 156 lb (70.8 kg)   SpO2 98%   BMI 24.62 kg/m    Physical Exam Vitals and nursing note reviewed.  Constitutional:      General: She is not in acute distress.    Appearance: Normal appearance. She is normal weight.  HENT:     Head: Normocephalic and atraumatic.     Nose: Nose normal.  Eyes:     Extraocular Movements: Extraocular movements intact.  Cardiovascular:     Rate and Rhythm: Normal rate.     Heart sounds: Normal heart sounds.  Pulmonary:     Effort: Pulmonary effort is normal.  Musculoskeletal:        General: Normal range of motion.     Cervical back: Normal range of motion.  Lymphadenopathy:     Cervical: No cervical adenopathy.  Skin:    Findings: Rash present.          Comments: Area of hyperpigmented, mildly erythematous, pruritic rash. No discharge, no bleeding noted.   Neurological:     General: No focal deficit present.     Mental Status: She is alert and oriented to person, place, and time.  Psychiatric:        Mood and Affect: Mood normal.        Thought Content: Thought content normal.    No results found for any visits on 12/27/23.      Assessment & Plan:   Tinea cruris -     Clotrimazole-Betamethasone; Apply 1 Application topically daily.  Dispense: 45 g; Refill: 0  Rash -      Clotrimazole-Betamethasone; Apply 1 Application topically daily.  Dispense: 45 g; Refill: 0     Meds ordered this encounter  Medications   clotrimazole-betamethasone (LOTRISONE) cream    Sig: Apply 1 Application topically daily.    Dispense:  45 g    Refill:  0    Return if symptoms worsen or fail to improve.  Moshe Cipro, FNP

## 2024-01-31 ENCOUNTER — Other Ambulatory Visit: Payer: Self-pay | Admitting: Internal Medicine

## 2024-01-31 DIAGNOSIS — I10 Essential (primary) hypertension: Secondary | ICD-10-CM

## 2024-02-04 ENCOUNTER — Other Ambulatory Visit: Payer: Self-pay | Admitting: Internal Medicine

## 2024-02-04 DIAGNOSIS — I10 Essential (primary) hypertension: Secondary | ICD-10-CM

## 2024-02-06 ENCOUNTER — Other Ambulatory Visit: Payer: Self-pay | Admitting: Internal Medicine

## 2024-02-06 DIAGNOSIS — E785 Hyperlipidemia, unspecified: Secondary | ICD-10-CM

## 2024-02-07 ENCOUNTER — Other Ambulatory Visit: Payer: Self-pay | Admitting: Internal Medicine

## 2024-02-07 DIAGNOSIS — I10 Essential (primary) hypertension: Secondary | ICD-10-CM

## 2024-02-07 MED ORDER — AMLODIPINE BESYLATE 5 MG PO TABS: 5.0000 mg | ORAL_TABLET | Freq: Every day | ORAL | 0 refills | Status: DC

## 2024-02-08 ENCOUNTER — Telehealth: Payer: Self-pay | Admitting: Internal Medicine

## 2024-02-08 ENCOUNTER — Other Ambulatory Visit: Payer: Self-pay | Admitting: Internal Medicine

## 2024-02-08 DIAGNOSIS — I10 Essential (primary) hypertension: Secondary | ICD-10-CM

## 2024-02-08 NOTE — Telephone Encounter (Unsigned)
 Copied from CRM [id number]. Topic: Clinical - Prescription Issue >> Feb 08, 2024 12:52 PM Adonis Hoot wrote: Reason for CRM: Patient called in stating that [facility] is telling her that they have not received prescription for  amLODipine  (NORVASC ) 5 MG tablet that was sent in by provider on 02/07/2024.

## 2024-02-10 ENCOUNTER — Other Ambulatory Visit: Payer: Self-pay

## 2024-02-10 DIAGNOSIS — I10 Essential (primary) hypertension: Secondary | ICD-10-CM

## 2024-02-10 MED ORDER — INDAPAMIDE 1.25 MG PO TABS: 1.2500 mg | ORAL_TABLET | Freq: Every day | ORAL | 1 refills | Status: DC

## 2024-02-10 MED ORDER — AMLODIPINE BESYLATE 5 MG PO TABS: 5.0000 mg | ORAL_TABLET | Freq: Every day | ORAL | 1 refills | Status: DC

## 2024-02-10 NOTE — Telephone Encounter (Signed)
 Copied from CRM [id number]. Topic: General - Other >> Feb 10, 2024 11:19 AM Alyse July wrote: Reason for CRM: Patient would like a call back pertaining to medication indapamide  (LOZOL ) 1.25 MG table being denied per pharmacy. [phone]

## 2024-02-10 NOTE — Telephone Encounter (Deleted)
 Copied from CRM [id number]. Topic: General - Other >> Feb 10, 2024 11:19 AM Alyse July wrote: Reason for CRM: Patient would like a call back pertaining to medication indapamide  (LOZOL ) 1.25 MG table being denied per pharmacy. [phone]

## 2024-02-10 NOTE — Telephone Encounter (Signed)
 Medication has been refilled and patient has been made aware.

## 2024-02-10 NOTE — Telephone Encounter (Signed)
 Patient has been made aware that her medication is in fact at the pharmacy and they will call her when she can pick it up

## 2024-02-14 ENCOUNTER — Ambulatory Visit: Admitting: Internal Medicine

## 2024-02-14 ENCOUNTER — Other Ambulatory Visit: Payer: Self-pay | Admitting: Internal Medicine

## 2024-02-14 DIAGNOSIS — E785 Hyperlipidemia, unspecified: Secondary | ICD-10-CM

## 2024-03-16 ENCOUNTER — Other Ambulatory Visit: Payer: Self-pay | Admitting: Internal Medicine

## 2024-03-16 DIAGNOSIS — E785 Hyperlipidemia, unspecified: Secondary | ICD-10-CM

## 2024-04-05 ENCOUNTER — Encounter: Payer: Self-pay | Admitting: Internal Medicine

## 2024-04-05 ENCOUNTER — Ambulatory Visit (INDEPENDENT_AMBULATORY_CARE_PROVIDER_SITE_OTHER): Admitting: Internal Medicine

## 2024-04-05 VITALS — BP 138/80 | HR 72 | Temp 98.1°F | Ht 66.75 in | Wt 157.2 lb

## 2024-04-05 DIAGNOSIS — R0609 Other forms of dyspnea: Secondary | ICD-10-CM | POA: Diagnosis not present

## 2024-04-05 DIAGNOSIS — E118 Type 2 diabetes mellitus with unspecified complications: Secondary | ICD-10-CM | POA: Diagnosis not present

## 2024-04-05 DIAGNOSIS — E1122 Type 2 diabetes mellitus with diabetic chronic kidney disease: Secondary | ICD-10-CM | POA: Diagnosis not present

## 2024-04-05 DIAGNOSIS — B351 Tinea unguium: Secondary | ICD-10-CM

## 2024-04-05 DIAGNOSIS — I10 Essential (primary) hypertension: Secondary | ICD-10-CM | POA: Diagnosis not present

## 2024-04-05 DIAGNOSIS — M5030 Other cervical disc degeneration, unspecified cervical region: Secondary | ICD-10-CM | POA: Diagnosis not present

## 2024-04-05 DIAGNOSIS — N182 Chronic kidney disease, stage 2 (mild): Secondary | ICD-10-CM | POA: Diagnosis not present

## 2024-04-05 DIAGNOSIS — E785 Hyperlipidemia, unspecified: Secondary | ICD-10-CM

## 2024-04-05 DIAGNOSIS — Z1231 Encounter for screening mammogram for malignant neoplasm of breast: Secondary | ICD-10-CM | POA: Insufficient documentation

## 2024-04-05 LAB — URINALYSIS, ROUTINE W REFLEX MICROSCOPIC
Bilirubin Urine: NEGATIVE
Hgb urine dipstick: NEGATIVE
Ketones, ur: NEGATIVE
Leukocytes,Ua: NEGATIVE
Nitrite: NEGATIVE
RBC / HPF: NONE SEEN (ref 0–?)
Specific Gravity, Urine: 1.015 (ref 1.000–1.030)
Total Protein, Urine: NEGATIVE
Urine Glucose: NEGATIVE
Urobilinogen, UA: 0.2 (ref 0.0–1.0)
pH: 7.5 (ref 5.0–8.0)

## 2024-04-05 LAB — LIPID PANEL
Cholesterol: 144 mg/dL (ref 0–200)
HDL: 55.9 mg/dL (ref >39.00)
LDL Cholesterol: 77 mg/dL (ref 0–99)
NonHDL: 87.7
Total CHOL/HDL Ratio: 3
Triglycerides: 53 mg/dL (ref 0.0–149.0)
VLDL: 10.6 mg/dL (ref 0.0–40.0)

## 2024-04-05 LAB — HEPATIC FUNCTION PANEL
ALT: 15 U/L (ref 0–35)
AST: 17 U/L (ref 0–37)
Albumin: 4.7 g/dL (ref 3.5–5.2)
Alkaline Phosphatase: 65 U/L (ref 39–117)
Bilirubin, Direct: 0.1 mg/dL (ref 0.0–0.3)
Total Bilirubin: 0.4 mg/dL (ref 0.2–1.2)
Total Protein: 7.6 g/dL (ref 6.0–8.3)

## 2024-04-05 LAB — CBC WITH DIFFERENTIAL/PLATELET
Basophils Absolute: 0.1 10*3/uL (ref 0.0–0.1)
Basophils Relative: 0.7 % (ref 0.0–3.0)
Eosinophils Absolute: 0.1 10*3/uL (ref 0.0–0.7)
Eosinophils Relative: 1.8 % (ref 0.0–5.0)
HCT: 38.6 % (ref 36.0–46.0)
Hemoglobin: 13.3 g/dL (ref 12.0–15.0)
Lymphocytes Relative: 58.7 % — ABNORMAL HIGH (ref 12.0–46.0)
Lymphs Abs: 4.1 10*3/uL — ABNORMAL HIGH (ref 0.7–4.0)
MCHC: 34.4 g/dL (ref 30.0–36.0)
MCV: 90.1 fl (ref 78.0–100.0)
Monocytes Absolute: 0.6 10*3/uL (ref 0.1–1.0)
Monocytes Relative: 8.2 % (ref 3.0–12.0)
Neutro Abs: 2.1 10*3/uL (ref 1.4–7.7)
Neutrophils Relative %: 30.6 % — ABNORMAL LOW (ref 43.0–77.0)
Platelets: 372 10*3/uL (ref 150.0–400.0)
RBC: 4.29 Mil/uL (ref 3.87–5.11)
RDW: 13.8 % (ref 11.5–15.5)
WBC: 7 10*3/uL (ref 4.0–10.5)

## 2024-04-05 LAB — MICROALBUMIN / CREATININE URINE RATIO
Creatinine,U: 127.7 mg/dL
Microalb Creat Ratio: UNDETERMINED mg/g (ref 0.0–30.0)
Microalb, Ur: <0.7 mg/dL

## 2024-04-05 LAB — BASIC METABOLIC PANEL WITH GFR
BUN: 12 mg/dL (ref 6–23)
CO2: 27 meq/L (ref 19–32)
Calcium: 9.7 mg/dL (ref 8.4–10.5)
Chloride: 103 meq/L (ref 96–112)
Creatinine, Ser: 0.89 mg/dL (ref 0.40–1.20)
GFR: 66.93 mL/min (ref >60.00)
Glucose, Bld: 98 mg/dL (ref 70–99)
Potassium: 3.7 meq/L (ref 3.5–5.1)
Sodium: 140 meq/L (ref 135–145)

## 2024-04-05 LAB — HEMOGLOBIN A1C: Hgb A1c MFr Bld: 6.9 % — ABNORMAL HIGH (ref 4.6–6.5)

## 2024-04-05 NOTE — Progress Notes (Unsigned)
 Subjective:  Patient ID: Lauren Orozco, female    DOB: 07/28/1956  Age: 68 y.o. MRN: [id number]  CC: Hypertension, Diabetes, and Hyperlipidemia   HPI Lauren Orozco presents for f/up ----  Discussed the use of AI scribe software for clinical note transcription with the patient, who gave verbal consent to proceed.  History of Present Illness   Lauren Orozco is a 68 year old female who presents with worsening neck and shoulder pain.  She experiences pain across her shoulder and neck, described as a 'catch', with radiation to her arms and tingling, particularly at night when lying down. She uses a heat pad and a soft neck brace to manage the pain, which has worsened recently. She recalls a fall a couple of months ago and has had imaging done, including an x-ray and possibly a CT scan, though she is unsure of the details.  She reports hip pain similar to what she experienced before her previous surgery, reminiscent of the initial symptoms that led to her surgery.  She experiences fatigue and DOE during physical activities such as cutting grass, noting that she gets tired and needs to cool down with air conditioning. No chest pain during these activities, but her heart rate increases.  She continues to smoke, although she states she does not smoke much due to being busy with work.       Outpatient Medications Prior to Visit  Medication Sig Dispense Refill   Accu-Chek Softclix Lancets lancets Use to check blood sugar daily. DX: E11.9 100 each 3   amLODipine  (NORVASC ) 5 MG tablet Take 1 tablet (5 mg total) by mouth daily. 90 tablet 1   Blood Glucose Monitoring Suppl (ACCU-CHEK GUIDE ME) w/Device KIT 1 Act by Does not apply route in the morning and at bedtime. 1 kit 0   Cholecalciferol  50 MCG (2000 UT) TABS Take 2 tablets (4,000 Units total) by mouth daily. 180 tablet 1   ciclopirox  (LOPROX ) 0.77 % cream Apply topically 2 (two) times daily. 90 g 1    clotrimazole -betamethasone  (LOTRISONE ) cream Apply 1 Application topically daily. 45 g 0   indapamide  (LOZOL ) 1.25 MG tablet Take 1 tablet (1.25 mg total) by mouth daily. 90 tablet 1   rosuvastatin  (CRESTOR ) 20 MG tablet TAKE 1 TABLET(20 MG) BY MOUTH DAILY WITH BREAKFAST 90 tablet 0   fluocinonide -emollient (LIDEX -E) 0.05 % cream Apply 1 Application topically 2 (two) times daily. (Patient not taking: Reported on 12/27/2023) 60 g 1   glucose blood (ACCU-CHEK GUIDE) test strip Use to check blood sugar daily. DX: E11.9 (Patient not taking: Reported on 12/27/2023) 100 each 3   No facility-administered medications prior to visit.    ROS Review of Systems  Objective:  BP 138/80 (BP Location: Left Arm, Patient Position: Sitting, Cuff Size: Normal)   Pulse 72   Temp 98.1 F (36.7 C) (Oral)   Ht 5' 6.75 (1.695 m)   Wt 157 lb 3.2 oz (71.3 kg)   SpO2 98%   BMI 24.81 kg/m   BP Readings from Last 3 Encounters:  04/05/24 138/80  12/27/23 (!) 150/78  11/16/23 127/73    Wt Readings from Last 3 Encounters:  04/05/24 157 lb 3.2 oz (71.3 kg)  12/27/23 156 lb (70.8 kg)  09/06/23 157 lb 3.2 oz (71.3 kg)    Physical Exam Cardiovascular:     Comments: EKG--- NSR, 64 bpm No LVH, Q waves, or ST/T wave changes  Musculoskeletal:     Right lower  leg: No edema.     Left lower leg: No edema.     Lab Results  Component Value Date   WBC 6.1 04/27/2023   HGB 13.3 04/27/2023   HCT 38.9 04/27/2023   PLT 388 04/27/2023   GLUCOSE 113 (H) 06/30/2023   CHOL 234 (H) 08/03/2023   TRIG 133.0 08/03/2023   HDL 60.90 08/03/2023   LDLDIRECT 150.8 10/19/2011   LDLCALC 147 (H) 08/03/2023   ALT 12 04/27/2023   AST 13 (L) 04/27/2023   NA 139 06/30/2023   K 3.9 06/30/2023   CL 101 06/30/2023   CREATININE 0.85 06/30/2023   BUN 12 06/30/2023   CO2 31 06/30/2023   TSH 0.58 06/30/2023   HGBA1C 6.6 (H) 06/30/2023    CT Super D Chest Wo Contrast Result Date: 10/16/2022 CLINICAL DATA:  Follow-up nodules  EXAM: CT CHEST WITHOUT CONTRAST TECHNIQUE: Multidetector CT imaging of the chest was performed using thin slice collimation for electromagnetic bronchoscopy planning purposes, without intravenous contrast. RADIATION DOSE REDUCTION: This exam was performed according to the departmental dose-optimization program which includes automated exposure control, adjustment of the mA and/or kV according to patient size and/or use of iterative reconstruction technique. COMPARISON:  Lung cancer screening CT dated April 21, 2022 FINDINGS: Cardiovascular: Normal heart size. No pericardial effusion. Normal caliber thoracic aorta with moderate calcified plaque. Moderate coronary artery calcifications. Mediastinum/Nodes: Moderate coronary artery calcifications. Esophagus and thyroid  are unremarkable no pathologically enlarged lymph nodes seen in the chest. Lungs/Pleura: Central airways are patent. No consolidation, pleural effusion or pneumothorax. Mild centrilobular emphysema. Pulmonary nodules which were new on prior lung cancer screening CT have resolved. Other previously seen small solid pulmonary nodules are stable. Reference solid pulmonary nodule of the left lower lobe measuring 4 mm on series 4, image 105. Upper Abdomen: Stable benign right adenoma, no specific follow-up imaging is recommended. Musculoskeletal: No chest wall mass or suspicious bone lesions identified. IMPRESSION: 1. Pulmonary nodules which were new on prior lung cancer screening CT have resolved. Other previously seen small solid pulmonary nodules are stable. Recommend return to annual lung cancer screening. 2. Coronary artery calcifications, aortic Atherosclerosis (ICD10-I70.0) and Emphysema (ICD10-J43.9). Electronically Signed   By: Rea Marc M.D.   On: 10/16/2022 11:27    Assessment & Plan:  Type II diabetes mellitus with manifestations (HCC) -     Urinalysis, Routine w reflex microscopic; Future -     Hemoglobin A1c; Future -     Basic  metabolic panel with GFR; Future -     Microalbumin / creatinine urine ratio; Future -     HM Diabetes Foot Exam -     CT CORONARY MORPH W/CTA COR W/SCORE W/CA W/CM &/OR WO/CM; Future  Hyperlipidemia with target LDL less than 130 -     Lipid panel; Future -     Hepatic function panel; Future -     CT CORONARY MORPH W/CTA COR W/SCORE W/CA W/CM &/OR WO/CM; Future  CKD stage 2 due to type 2 diabetes mellitus (HCC) -     Urinalysis, Routine w reflex microscopic; Future -     Basic metabolic panel with GFR; Future -     Microalbumin / creatinine urine ratio; Future  Essential hypertension -     CBC with Differential/Platelet; Future -     Basic metabolic panel with GFR; Future -     EKG 12-Lead  Screening mammogram for breast cancer -     Digital Screening Mammogram, Left and Right; Future  DISC  DISEASE, CERVICAL -     Ambulatory referral to Physical Medicine Rehab  Onychomycosis of nail of digit of hand -     Ambulatory referral to Podiatry  DOE (dyspnea on exertion) -     CT CORONARY MORPH W/CTA COR W/SCORE W/CA W/CM &/OR WO/CM; Future     Follow-up: Return in about 3 months (around 07/06/2024).  Debby Molt, MD

## 2024-04-05 NOTE — Patient Instructions (Signed)

## 2024-04-06 ENCOUNTER — Other Ambulatory Visit: Payer: Medicare Other

## 2024-04-06 ENCOUNTER — Ambulatory Visit: Payer: Self-pay | Admitting: Internal Medicine

## 2024-04-11 ENCOUNTER — Telehealth: Payer: Self-pay

## 2024-04-11 NOTE — Telephone Encounter (Signed)
 Copied from CRM [id number]. Topic: Appointments - Scheduling Inquiry for Clinic >> Apr 11, 2024  3:28 PM Lauren Orozco wrote: Reason for CRM: Patient is calling to see how to schedule her CT CORONARY

## 2024-04-13 ENCOUNTER — Encounter (HOSPITAL_COMMUNITY): Payer: Self-pay

## 2024-04-14 NOTE — Telephone Encounter (Signed)
 This has been scheduled

## 2024-04-16 ENCOUNTER — Emergency Department (HOSPITAL_COMMUNITY)
Admission: EM | Admit: 2024-04-16 | Discharge: 2024-04-16 | Disposition: A | Discharge status: Home / Self Care | Attending: Emergency Medicine | Admitting: Emergency Medicine

## 2024-04-16 ENCOUNTER — Other Ambulatory Visit: Payer: Self-pay

## 2024-04-16 ENCOUNTER — Encounter (HOSPITAL_COMMUNITY): Payer: Self-pay

## 2024-04-16 ENCOUNTER — Emergency Department (HOSPITAL_COMMUNITY)

## 2024-04-16 DIAGNOSIS — R0789 Other chest pain: Secondary | ICD-10-CM | POA: Insufficient documentation

## 2024-04-16 DIAGNOSIS — R059 Cough, unspecified: Secondary | ICD-10-CM | POA: Diagnosis not present

## 2024-04-16 DIAGNOSIS — R079 Chest pain, unspecified: Secondary | ICD-10-CM

## 2024-04-16 DIAGNOSIS — R0602 Shortness of breath: Secondary | ICD-10-CM | POA: Diagnosis not present

## 2024-04-16 LAB — CBC
HCT: 39.8 % (ref 36.0–46.0)
Hemoglobin: 13.5 g/dL (ref 12.0–15.0)
MCH: 30.2 pg (ref 26.0–34.0)
MCHC: 33.9 g/dL (ref 30.0–36.0)
MCV: 89 fL (ref 80.0–100.0)
Platelets: 373 K/uL (ref 150–400)
RBC: 4.47 MIL/uL (ref 3.87–5.11)
RDW: 13.2 % (ref 11.5–15.5)
WBC: 6.3 K/uL (ref 4.0–10.5)
nRBC: 0 % (ref 0.0–0.2)

## 2024-04-16 LAB — BASIC METABOLIC PANEL WITH GFR
Anion gap: 10 (ref 5–15)
BUN: 10 mg/dL (ref 8–23)
CO2: 25 mmol/L (ref 22–32)
Calcium: 9.8 mg/dL (ref 8.9–10.3)
Chloride: 101 mmol/L (ref 98–111)
Creatinine, Ser: 0.8 mg/dL (ref 0.44–1.00)
GFR, Estimated: >60 mL/min (ref >60)
Glucose, Bld: 121 mg/dL — ABNORMAL HIGH (ref 70–99)
Potassium: 3.6 mmol/L (ref 3.5–5.1)
Sodium: 136 mmol/L (ref 135–145)

## 2024-04-16 LAB — TROPONIN I (HIGH SENSITIVITY)
Troponin I (High Sensitivity): 4 ng/L (ref <18)
Troponin I (High Sensitivity): 5 ng/L (ref <18)

## 2024-04-16 MED ORDER — ALUM & MAG HYDROXIDE-SIMETH 200-200-20 MG/5ML PO SUSP
30.0000 mL | Freq: Once | ORAL | Status: AC
  Administered 2024-04-16: 30 mL via ORAL
  Filled 2024-04-16: qty 30

## 2024-04-16 MED ORDER — LIDOCAINE VISCOUS HCL 2 % MT SOLN
15.0000 mL | Freq: Once | OROMUCOSAL | Status: AC
  Administered 2024-04-16: 15 mL via ORAL
  Filled 2024-04-16: qty 15

## 2024-04-16 NOTE — Discharge Instructions (Signed)
 As discussed, your evaluation today has been largely reassuring.  But, it is important that you monitor your condition carefully, and do not hesitate to return to the ED if you develop new, or concerning changes in your condition. ? ?Otherwise, please follow-up with your physician for appropriate ongoing care. ? ?

## 2024-04-16 NOTE — ED Triage Notes (Signed)
 Pt c.o productive cough, chest pains and sob since yesterday evening.

## 2024-04-16 NOTE — ED Provider Notes (Signed)
 [place] [facility] Provider Note   CSN: [id number] Arrival date & time: 04/16/24  1215     Patient presents with: Chest Pain, Cough, and Shortness of Breath   Lauren Orozco is a 68 y.o. female.   68 year old female with prior medical history as detailed below presents for evaluation.  Patient reports that she had some mild epigastric burning discomfort last night.  This got better.  It recurred again this morning after she left church.  She denies nausea or vomiting.  She denies fever.  She denies known prior history of CAD or cardiac disease.  She is scheduled for coronary CT scan tomorrow -apparently scheduled by her PCP.  She denies back pain, shortness of breath, fever, abdominal pain, other complaint.  The history is provided by the patient and medical records.       Prior to Admission medications   Medication Sig Start Date End Date Taking? Authorizing Provider  Accu-Chek Softclix Lancets lancets Use to check blood sugar daily. DX: E11.9 04/26/20   Joshua Debby CROME, MD  amLODipine  (NORVASC ) 5 MG tablet Take 1 tablet (5 mg total) by mouth daily. 02/10/24   Joshua Debby CROME, MD  Blood Glucose Monitoring Suppl (ACCU-CHEK GUIDE ME) w/Device KIT 1 Act by Does not apply route in the morning and at bedtime. 02/08/20   Joshua Debby CROME, MD  Cholecalciferol  50 MCG (2000 UT) TABS Take 2 tablets (4,000 Units total) by mouth daily. 12/24/20   Joshua Debby CROME, MD  ciclopirox  (LOPROX ) 0.77 % cream Apply topically 2 (two) times daily. 08/03/23   Joshua Debby CROME, MD  clotrimazole -betamethasone  (LOTRISONE ) cream Apply 1 Application topically daily. 12/27/23   Alvia Corean CROME, FNP  indapamide  (LOZOL ) 1.25 MG tablet Take 1 tablet (1.25 mg total) by mouth daily. 02/10/24   Joshua Debby CROME, MD  rosuvastatin  (CRESTOR ) 20 MG tablet TAKE 1 TABLET(20 MG) BY MOUTH DAILY WITH BREAKFAST 03/17/24   Joshua Debby CROME, MD    Allergies: Ciprofloxacin, Ofloxacin, and  Other    Review of Systems  All other systems reviewed and are negative.   Updated Vital Signs BP (!) 148/84 (BP Location: Left Arm)   Pulse 83   Temp 98.4 F (36.9 C)   Resp 17   SpO2 99%   Physical Exam Vitals and nursing note reviewed.  Constitutional:      General: She is not in acute distress.    Appearance: Normal appearance. She is well-developed.  HENT:     Head: Normocephalic and atraumatic.  Eyes:     Conjunctiva/sclera: Conjunctivae normal.     Pupils: Pupils are equal, round, and reactive to light.  Cardiovascular:     Rate and Rhythm: Normal rate and regular rhythm.     Heart sounds: Normal heart sounds.  Pulmonary:     Effort: Pulmonary effort is normal. No respiratory distress.     Breath sounds: Normal breath sounds.  Abdominal:     General: There is no distension.     Palpations: Abdomen is soft.     Tenderness: There is no abdominal tenderness.  Musculoskeletal:        General: No deformity. Normal range of motion.     Cervical back: Normal range of motion and neck supple.  Skin:    General: Skin is warm and dry.  Neurological:     General: No focal deficit present.     Mental Status: She is alert and oriented to person, place, and time.     (  all labs ordered are listed, but only abnormal results are displayed) Labs Reviewed  BASIC METABOLIC PANEL WITH GFR - Abnormal; Notable for the following components:      Result Value   Glucose, Bld 121 (*)    All other components within normal limits  CBC  TROPONIN I (HIGH SENSITIVITY)  TROPONIN I (HIGH SENSITIVITY)    EKG: EKG Interpretation Date/Time:  Sunday April 16 2024 12:24:28 EDT Ventricular Rate:  76 PR Interval:  136 QRS Duration:  82 QT Interval:  382 QTC Calculation: 429 R Axis:   76  Text Interpretation: Normal sinus rhythm Normal ECG When compared with ECG of 10-Apr-2019 19:43, PREVIOUS ECG IS PRESENT Confirmed by Laurice Coy [phone]) on 04/16/2024 1:52:46 PM  Radiology: [facility]  Chest 2 View Result Date: 04/16/2024 CLINICAL DATA:  Chest pain. EXAM: CHEST - 2 VIEW COMPARISON:  August 29, 2022. FINDINGS: The heart size and mediastinal contours are within normal limits. Both lungs are clear. The visualized skeletal structures are unremarkable. IMPRESSION: No active cardiopulmonary disease. Electronically Signed   By: Lynwood Landy Raddle M.D.   On: 04/16/2024 13:25     Procedures   Medications Ordered in the ED  alum & mag hydroxide-simeth (MAALOX/MYLANTA) 200-200-20 MG/5ML suspension 30 mL (has no administration in time range)    And  lidocaine  (XYLOCAINE ) 2 % viscous mouth solution 15 mL (has no administration in time range)                                    Medical Decision Making Amount and/or Complexity of Data Reviewed Labs: ordered. Radiology: ordered.  Risk OTC drugs. Prescription drug management.    Medical Screen Complete  This patient presented to the ED with complaint of chest discomfort.  This complaint involves an extensive number of treatment options. The initial differential diagnosis includes, but is not limited to, ACS, angina, metabolic abnormality, etc.  This presentation is: Acute, Chronic, Self-Limited, Previously Undiagnosed, Uncertain Prognosis, Complicated, Systemic Symptoms, and Threat to Life/Bodily Function Patient presents with reported chest discomfort.  Describes symptoms are atypical for likely ACS.  Initial EKG is without evidence of acute ischemia.  Initial troponin is reassuringly low.  Repeat troponin is pending.  Other obtain screening labs and imaging are without acute abnormality.  Oncoming ED provider is aware of pending troponin and need for reevaluation and likely discharge home.    Additional history obtained:  External records from outside sources obtained and reviewed including prior ED visits and prior Inpatient records.   Problem List / ED Course:  Chest pain   Disposition:  After  consideration of the diagnostic results and the patients response to treatment, I feel that the patent would benefit from completion of ED evaluation.       Final diagnoses:  Chest pain, unspecified type    ED Discharge Orders     None          Laurice Coy BROCKS, MD 04/16/24 1558

## 2024-04-16 NOTE — ED Provider Triage Note (Signed)
 Emergency Medicine Provider Triage Evaluation Note  Misbah Hornaday , a 68 y.o. female  was evaluated in triage.  Pt complains of chest discomfort that started after leaving church.  States she felt unwell since waking up but symptoms got worse after leaving church.  Has a coronary CT scan scheduled for tomorrow but could not wait due to symptoms getting worse.  Review of Systems  Positive: As above Negative: As above  Physical Exam  BP (!) 148/84 (BP Location: Left Arm)   Pulse 83   Temp 98.4 F (36.9 C)   Resp 17   SpO2 99%  Gen:   Awake, no distress   Resp:  Normal effort  MSK:   Moves extremities without difficulty  Other:    Medical Decision Making  Medically screening exam initiated at 12:39 PM.  Appropriate orders placed.  Mitzy Naron Folker was informed that the remainder of the evaluation will be completed by another provider, this initial triage assessment does not replace that evaluation, and the importance of remaining in the ED until their evaluation is complete.    Hildegard Loge, PA-C 04/16/24 1240

## 2024-04-16 NOTE — ED Notes (Signed)
 Pt removed all monitor cords and says she ready to go home.

## 2024-04-17 ENCOUNTER — Encounter: Payer: Self-pay | Admitting: Family Medicine

## 2024-04-17 ENCOUNTER — Ambulatory Visit (HOSPITAL_COMMUNITY)
Admission: RE | Admit: 2024-04-17 | Discharge: 2024-04-17 | Disposition: A | Discharge status: Home / Self Care | Source: Ambulatory Visit | Attending: Internal Medicine | Admitting: Internal Medicine

## 2024-04-17 ENCOUNTER — Ambulatory Visit: Payer: Self-pay

## 2024-04-17 ENCOUNTER — Other Ambulatory Visit: Payer: Self-pay | Admitting: Internal Medicine

## 2024-04-17 ENCOUNTER — Ambulatory Visit (INDEPENDENT_AMBULATORY_CARE_PROVIDER_SITE_OTHER): Admitting: Family Medicine

## 2024-04-17 VITALS — BP 110/62 | HR 90 | Temp 98.3°F | Ht 66.75 in | Wt 155.1 lb

## 2024-04-17 DIAGNOSIS — M5030 Other cervical disc degeneration, unspecified cervical region: Secondary | ICD-10-CM

## 2024-04-17 DIAGNOSIS — E118 Type 2 diabetes mellitus with unspecified complications: Secondary | ICD-10-CM | POA: Diagnosis not present

## 2024-04-17 DIAGNOSIS — Z1231 Encounter for screening mammogram for malignant neoplasm of breast: Secondary | ICD-10-CM

## 2024-04-17 DIAGNOSIS — N182 Chronic kidney disease, stage 2 (mild): Secondary | ICD-10-CM

## 2024-04-17 DIAGNOSIS — T801XXA Vascular complications following infusion, transfusion and therapeutic injection, initial encounter: Secondary | ICD-10-CM

## 2024-04-17 DIAGNOSIS — R0609 Other forms of dyspnea: Secondary | ICD-10-CM

## 2024-04-17 DIAGNOSIS — I10 Essential (primary) hypertension: Secondary | ICD-10-CM

## 2024-04-17 DIAGNOSIS — E785 Hyperlipidemia, unspecified: Secondary | ICD-10-CM | POA: Diagnosis not present

## 2024-04-17 DIAGNOSIS — I209 Angina pectoris, unspecified: Secondary | ICD-10-CM | POA: Insufficient documentation

## 2024-04-17 MED ORDER — IOHEXOL 350 MG/ML SOLN
100.0000 mL | Freq: Once | INTRAVENOUS | Status: AC | PRN
  Administered 2024-04-17: 100 mL via INTRAVENOUS

## 2024-04-17 MED ORDER — PREDNISONE 20 MG PO TABS: 40.0000 mg | ORAL_TABLET | Freq: Every day | ORAL | 0 refills | Status: AC

## 2024-04-17 MED ORDER — NITROGLYCERIN 0.4 MG SL SUBL
0.8000 mg | SUBLINGUAL_TABLET | Freq: Once | SUBLINGUAL | Status: AC
  Administered 2024-04-17: 0.8 mg via SUBLINGUAL

## 2024-04-17 NOTE — Telephone Encounter (Signed)
    FYI Only or Action Required?: FYI only for provider.  Patient was last seen in primary care on 04/05/2024 by Lauren Debby CROME, MD.  Called Nurse Triage reporting Right Arm Swelling/Pain.  Symptoms began 20 minutes prior to this triage.  Interventions attempted: Nothing.  Symptoms are: gradually worsening.  Triage Disposition: See Physician Within 24 Hours  Patient/caregiver understands and will follow disposition?: Yes, with modificiations                       Copied from CRM [id number]. Topic: Clinical - Red Word Triage >> Apr 17, 2024  3:06 PM Lauren Orozco wrote: Red Word that prompted transfer to Nurse Triage: Tried to find a vein to put contrast in for imaging- tech used an ultrasound to find the vein.  Patients arm is in pain all the way up to shoulder and swollen Reason for Disposition  MODERATE arm swelling (e.g., puffiness or swollen feeling of entire arm)    With patient having severe pain ---This RN recommended her being seen today and called [facility] to see if there were any openings available at their facility or a nearby facility for the patient and CAL assisted the patient with getting seen today at the [facility] location  Answer Assessment - Initial Assessment Questions Patient states that she just had a Cat Scan done that used IV contrast dye She states that they attempted twice at starting an IV on her and during the test she felt pain and swelling in this right arm. She states that it is very swollen and painful up to her shoulder Called [facility] due to patient stating that she was in the parking lot of the PCP office. CAL further assisted patient in getting scheduled and seen today at the [facility] office    1. ONSET: When did the swelling start? (e.g., minutes, hours, days)     20 minutes prior to this triage call 2. LOCATION: What part of the arm is swollen?  Are both arms swollen or just one arm?     Right arm swelling and  pain 3. SEVERITY: How bad is the swelling? (e.g., localized; mild, moderate, severe)     Patient states it is bad 4. REDNESS: Is there redness or signs of infection?     A little red 5. PAIN: Is the swelling painful to touch? If Yes, ask: How painful is it?   (Scale 1-10; mild, moderate or severe)     10 6. FEVER: Do you have a fever? If Yes, ask: What is it, how was it measured, and when did it start?      no 7. CAUSE: What do you think is causing the arm swelling?     IV contrast test today 8. MEDICAL HISTORY: Do you have a history of heart failure, kidney disease, liver failure, or cancer?     ----- 9. RECURRENT SYMPTOM: Have you had arm swelling before? If Yes, ask: When was the last time? What happened that time?     ---- 10. OTHER SYMPTOMS: Do you have any other symptoms? (e.g., chest pain, difficulty breathing)       Headache and pain in arm up to shoulder  Protocols used: Arm Swelling and Edema-A-AH

## 2024-04-17 NOTE — Progress Notes (Signed)
   Acute Office Visit  Subjective:     Patient ID: Lauren Orozco, female    DOB: Aug 24, 1956, 68 y.o.   MRN: [id number]  Chief Complaint  Patient presents with   Edema    Patient complains of pain and swelling of the right arm since technician attempted to insert IV contrast    HPI Patient is in today for acute swelling and pain of her right arm. States earlier today she was getting a CT calcium  score, the techs had to stick her several times to administer the IV contrast. However she felt pain and swelling in the arm, states it was like a cold sensation going over there right forearm and the procedure was stopped and it was discovered that the IV had infiltrated and the contrast wasn't going into her vein. They applied a cold pack and she was told to follow up. She reports there is soreness and pain in the areas of swelling, no numbness or tingling in her hand, no coldness in her forearm. No SOB, no difficulty breathing, no throat closing, no rashes.   Review of Systems  All other systems reviewed and are negative.       Objective:    BP 110/62   Pulse 90   Temp 98.3 F (36.8 C) (Oral)   Ht 5' 6.75 (1.695 m)   Wt 155 lb 1.6 oz (70.4 kg)   SpO2 96%   BMI 24.47 kg/m    Physical Exam Vitals reviewed.  Constitutional:      Appearance: Normal appearance. She is normal weight.  Cardiovascular:     Pulses: Normal pulses.  Pulmonary:     Effort: Pulmonary effort is normal.  Musculoskeletal:        General: Swelling (right forearm swelling and the swelling extends to the proximal bicep of the right arm, it is soft to touch, somewhat tender, no erythema or rash noted) present.  Neurological:     Mental Status: She is alert.     No results found for any visits on 04/17/24.      Assessment & Plan:   Problem List Items Addressed This Visit   None Visit Diagnoses       IV infiltration, initial encounter    -  Primary   Relevant Medications   predniSONE   (DELTASONE ) 20 MG tablet     Most likely a localized reaction to the contrast dye that infiltrated in her IV, the arm is soft, no rashes, no blistering, no redness or heat. Radial pulse is 2+ and bounding. Pt denies any numbness or tingling in the arm or hand. Recommended applying ice to the arm and elevation of the arm to assist with drainage for at least the next 24-48 hours. Will also give 4 days of prednisone  to help reduce inflammation. I discussed the warning signs/symptoms of compartment syndrome and infection with the patient. She is to RTC to the ER if she experiences any of these symptoms. Pt voiced understanding of the instructions I gave her.   Meds ordered this encounter  Medications   predniSONE  (DELTASONE ) 20 MG tablet    Sig: Take 2 tablets (40 mg total) by mouth daily with breakfast for 4 days.    Dispense:  8 tablet    Refill:  0    No follow-ups on file.  Heron CHRISTELLA Sharper, MD

## 2024-04-17 NOTE — Progress Notes (Signed)
 Patient presents for a cardiac CT scan. Pt needed ultrasound guided IV after more than one IV attempt.  During the study, the IV on her right arm in the [facility] extravasated. Pt reports severe pain. I examined the site and noted some swelling above the injection site. The CT tech estimates an volume of 75ml of contrast/saline was injected in her arm.  She had a +2 radial pulse distal to the injection site. Dr. Nishan was notified and recommended that we give the patient a cool compress and apply a pressure dressing.  He stated that the patient could go home unless she wanted to go to the emergency department.  Patient was very frustrated with the events of this visit. She stated, I should have never come here today. I should have listened to my gut.  She called for a family member to pick her up and take her to the emergency department.  I offered to hand her some discharge instructions but patient very quickly left the department.

## 2024-04-18 ENCOUNTER — Other Ambulatory Visit: Payer: Self-pay | Admitting: Internal Medicine

## 2024-04-18 DIAGNOSIS — I2510 Atherosclerotic heart disease of native coronary artery without angina pectoris: Secondary | ICD-10-CM | POA: Insufficient documentation

## 2024-04-18 MED ORDER — ASPIRIN 81 MG PO TBEC
81.0000 mg | DELAYED_RELEASE_TABLET | Freq: Every day | ORAL | 1 refills | Status: AC
Start: 2024-04-18 — End: ?

## 2024-04-20 ENCOUNTER — Ambulatory Visit: Payer: Self-pay | Admitting: Internal Medicine

## 2024-04-27 ENCOUNTER — Inpatient Hospital Stay: Payer: Medicare Other | Attending: Hematology and Oncology

## 2024-04-27 ENCOUNTER — Encounter: Payer: Self-pay | Admitting: Hematology and Oncology

## 2024-04-27 ENCOUNTER — Inpatient Hospital Stay: Payer: Medicare Other | Admitting: Hematology and Oncology

## 2024-04-27 VITALS — BP 137/90 | HR 84 | Temp 98.1°F | Resp 18 | Ht 66.75 in | Wt 155.2 lb

## 2024-04-27 DIAGNOSIS — F17210 Nicotine dependence, cigarettes, uncomplicated: Secondary | ICD-10-CM | POA: Diagnosis not present

## 2024-04-27 DIAGNOSIS — Z8572 Personal history of non-Hodgkin lymphomas: Secondary | ICD-10-CM | POA: Insufficient documentation

## 2024-04-27 DIAGNOSIS — C8590 Non-Hodgkin lymphoma, unspecified, unspecified site: Secondary | ICD-10-CM

## 2024-04-27 DIAGNOSIS — Z720 Tobacco use: Secondary | ICD-10-CM

## 2024-04-27 DIAGNOSIS — R918 Other nonspecific abnormal finding of lung field: Secondary | ICD-10-CM | POA: Insufficient documentation

## 2024-04-27 LAB — CBC WITH DIFFERENTIAL/PLATELET
Abs Immature Granulocytes: 0.02 K/uL (ref 0.00–0.07)
Basophils Absolute: 0 K/uL (ref 0.0–0.1)
Basophils Relative: 0 %
Eosinophils Absolute: 0.1 K/uL (ref 0.0–0.5)
Eosinophils Relative: 2 %
HCT: 39.2 % (ref 36.0–46.0)
Hemoglobin: 13.4 g/dL (ref 12.0–15.0)
Immature Granulocytes: 0 %
Lymphocytes Relative: 56 %
Lymphs Abs: 3.6 K/uL (ref 0.7–4.0)
MCH: 30 pg (ref 26.0–34.0)
MCHC: 34.2 g/dL (ref 30.0–36.0)
MCV: 87.9 fL (ref 80.0–100.0)
Monocytes Absolute: 0.7 K/uL (ref 0.1–1.0)
Monocytes Relative: 11 %
Neutro Abs: 2 K/uL (ref 1.7–7.7)
Neutrophils Relative %: 31 %
Platelets: 372 K/uL (ref 150–400)
RBC: 4.46 MIL/uL (ref 3.87–5.11)
RDW: 13.7 % (ref 11.5–15.5)
WBC: 6.5 K/uL (ref 4.0–10.5)
nRBC: 0 % (ref 0.0–0.2)

## 2024-04-27 LAB — COMPREHENSIVE METABOLIC PANEL WITH GFR
ALT: 15 U/L (ref 0–44)
AST: 13 U/L — ABNORMAL LOW (ref 15–41)
Albumin: 4.4 g/dL (ref 3.5–5.0)
Alkaline Phosphatase: 61 U/L (ref 38–126)
Anion gap: 8 (ref 5–15)
BUN: 14 mg/dL (ref 8–23)
CO2: 28 mmol/L (ref 22–32)
Calcium: 9.6 mg/dL (ref 8.9–10.3)
Chloride: 103 mmol/L (ref 98–111)
Creatinine, Ser: 0.82 mg/dL (ref 0.44–1.00)
GFR, Estimated: >60 mL/min (ref >60)
Glucose, Bld: 113 mg/dL — ABNORMAL HIGH (ref 70–99)
Potassium: 3.8 mmol/L (ref 3.5–5.1)
Sodium: 139 mmol/L (ref 135–145)
Total Bilirubin: 0.5 mg/dL (ref 0.0–1.2)
Total Protein: 7.4 g/dL (ref 6.5–8.1)

## 2024-04-27 NOTE — Assessment & Plan Note (Addendum)
The patient is at high risk for cancer due to ongoing smoking We discussed importance of nicotine cessation

## 2024-04-27 NOTE — Assessment & Plan Note (Addendum)
 The patient was diagnosed with chronic lymphocytosis, flow cytometry confirm non-Hodgkin lymphoma since 2016 and has been on surveillance  She is asymptomatic Repeat CBC came back normal We will continue yearly follow-up

## 2024-04-27 NOTE — Assessment & Plan Note (Addendum)
She is at high risk for lung cancer She will continue active surveillance under the guidance of pulmonologist

## 2024-04-27 NOTE — Progress Notes (Signed)
 [facility] OFFICE PROGRESS NOTE  Patient Care Team: Joshua Debby CROME, MD as PCP - General (Internal Medicine) Lonn Hicks, MD as Consulting Physician (Hematology and Oncology) Tobie Tonita POUR, DO as Consulting Physician (Neurology) Brenna Adine CROME, DO as Consulting Physician (Pulmonary Disease) Robinson Mayo, OD as Referring Physician (Optometry) Tobie Franky SQUIBB, DPM as Consulting Physician (Podiatry) Merceda Lela SAUNDERS, [facility] (Pharmacist)  Assessment & Plan Low grade lymphoma, stage I [facility]) The patient was diagnosed with chronic lymphocytosis, flow cytometry confirm non-Hodgkin lymphoma since 2016 and has been on surveillance  She is asymptomatic Repeat CBC came back normal We will continue yearly follow-up Tobacco abuse The patient is at high risk for cancer due to ongoing smoking We discussed importance of nicotine  cessation Multiple lung nodules on CT She is at high risk for lung cancer She will continue active surveillance under the guidance of pulmonologist  No orders of the defined types were placed in this encounter.    Hicks Lonn, MD  INTERVAL HISTORY: she returns for surveillance follow-up for chronic lymphocytosis/low-grade lymphoma She is doing well She is still smoking Denies recent infection no new lymphadenopathy  PHYSICAL EXAMINATION: ECOG PERFORMANCE STATUS: 0 - Asymptomatic  Vitals:   04/27/24 1228  BP: (!) 137/90  Pulse: 84  Resp: 18  Temp: 98.1 F (36.7 C)  SpO2: 98%   Filed Weights   04/27/24 1228  Weight: 155 lb 3.2 oz (70.4 kg)    Relevant data reviewed during this visit included CBC

## 2024-05-01 ENCOUNTER — Encounter: Payer: Self-pay | Admitting: Physical Medicine and Rehabilitation

## 2024-05-01 ENCOUNTER — Ambulatory Visit: Admitting: Physical Medicine and Rehabilitation

## 2024-05-01 ENCOUNTER — Ambulatory Visit (INDEPENDENT_AMBULATORY_CARE_PROVIDER_SITE_OTHER): Admitting: Podiatry

## 2024-05-01 DIAGNOSIS — M961 Postlaminectomy syndrome, not elsewhere classified: Secondary | ICD-10-CM

## 2024-05-01 DIAGNOSIS — M542 Cervicalgia: Secondary | ICD-10-CM

## 2024-05-01 DIAGNOSIS — Z91199 Patient's noncompliance with other medical treatment and regimen due to unspecified reason: Secondary | ICD-10-CM

## 2024-05-01 NOTE — Progress Notes (Signed)
 Cancel 24 hours

## 2024-05-01 NOTE — Progress Notes (Signed)
 Pain Scale   Average Pain 7    Back pain with radiation to arms and legs  Numbness and tingling in both arms and legs  Uses heating pads  Chronic pain  Tylenol  - relieves for a little while   Cervical 3-7 fusion    86 bpm 138/78

## 2024-05-01 NOTE — Progress Notes (Signed)
 Patient came into today at the request of Dr. Debby Molt. She verbalized prior care with [facility], Dr. Carollee and Dr. Mavis. She does have history of prior cervical fusion. She would prefer to be seen by her established provider. I have sent Dr. Molt a message regarding this.

## 2024-05-15 ENCOUNTER — Ambulatory Visit: Admitting: Podiatry

## 2024-05-17 ENCOUNTER — Ambulatory Visit: Admitting: Podiatry

## 2024-05-17 ENCOUNTER — Encounter: Payer: Self-pay | Admitting: Podiatry

## 2024-05-17 DIAGNOSIS — M79674 Pain in right toe(s): Secondary | ICD-10-CM

## 2024-05-17 DIAGNOSIS — E1142 Type 2 diabetes mellitus with diabetic polyneuropathy: Secondary | ICD-10-CM | POA: Diagnosis not present

## 2024-05-17 DIAGNOSIS — M79675 Pain in left toe(s): Secondary | ICD-10-CM | POA: Diagnosis not present

## 2024-05-17 DIAGNOSIS — B351 Tinea unguium: Secondary | ICD-10-CM | POA: Diagnosis not present

## 2024-05-17 NOTE — Progress Notes (Signed)
  Subjective:  Patient ID: Lauren Orozco, female    DOB: Aug 18, 1956,   MRN: [id number]  Chief Complaint  Patient presents with   Nail Problem    I have ugly toenails.  Dr. Joshua thinks it's fungus.  My sister said I have eczema on both heels.  I been using Crisco on it.    68 y.o. female presents for concern of thickened and dystrophic toenails. Wondering if fungus and can get rid of them. She also has concern for dry skin on the back of her heels. Concern of thickened elongated and painful nails that are difficult to trim. Requesting to have them trimmed today. Relates burning and tingling in their feet. Patient is diabetic and last A1c was  Lab Results  Component Value Date   HGBA1C 6.9 (H) 04/05/2024   .   PCP:  Joshua Debby CROME, MD    . Denies any other pedal complaints. Denies n/v/f/c.   Past Medical History:  Diagnosis Date   Anxiety    Chronic neck pain    with radiculopathy   Chronic pain of left wrist 2009   since neck surgery   Complication of anesthesia    Hard to wake up per pt; after neck sx had to be bagged.   DEPRESSION    DISC DISEASE, CERVICAL    Diverticulitis    HYPERLIPIDEMIA    Hypertension    Lymphocytosis 01/02/2016   NEPHROLITHIASIS, HX OF    NHL (non-Hodgkin's lymphoma) (HCC) 12/25/2014   Recurrent abdominal pain    Umbilical hernia    UTI'S, CHRONIC     Objective:  Physical Exam: Vascular: DP/PT pulses 2/4 bilateral. CFT <3 seconds. Absent hair growth on digits. Edema noted to bilateral lower extremities. Xerosis noted bilaterally.  Skin. No lacerations or abrasions bilateral feet. Nails 1-5 bilateral  are thickened discolored and elongated with subungual debris. Xerosis noted to posterior heel bilateral.  Musculoskeletal: MMT 5/5 bilateral lower extremities in DF, PF, Inversion and Eversion. Deceased ROM in DF of ankle joint.  Neurological: Sensation intact to light touch. Protective sensation diminished bilateral.    Assessment:    1. Onychomycosis   2. Pain due to onychomycosis of toenails of both feet   3. Type 2 diabetes mellitus with peripheral neuropathy (HCC)      Plan:  Patient was evaluated and treated and all questions answered. -Examined patient -Discussed treatment options for painful dystrophic nails  -Clinical picture and Fungal culture was obtained by removing a portion of the hard nail itself from each of the involved toenails using a sterile nail nipper and sent to [facility] lab. Patient tolerated the biopsy procedure well without discomfort or need for anesthesia.  -Discussed fungal nail treatment options including oral, topical, and laser treatments.  -Discussed and educated patient on diabetic foot care, especially with  regards to the vascular, neurological and musculoskeletal systems.  -Stressed the importance of good glycemic control and the detriment of not  controlling glucose levels in relation to the foot. -Discussed supportive shoes at all times and checking feet regularly.  -Mechanically debrided all nails 1-5 bilateral using sterile nail nipper and filed with dremel without incident  -Patient to return in 4 weeks for follow up evaluation and discussion of fungal culture results or sooner if symptoms worsen.   Asberry Failing, DPM

## 2024-05-17 NOTE — Addendum Note (Signed)
 Addended by: WAYLAN ELIDIA PARAS on: 05/17/2024 04:34 PM   Modules accepted: Orders

## 2024-05-18 DIAGNOSIS — H524 Presbyopia: Secondary | ICD-10-CM | POA: Diagnosis not present

## 2024-05-18 DIAGNOSIS — H2513 Age-related nuclear cataract, bilateral: Secondary | ICD-10-CM | POA: Diagnosis not present

## 2024-05-18 DIAGNOSIS — H40053 Ocular hypertension, bilateral: Secondary | ICD-10-CM | POA: Diagnosis not present

## 2024-05-29 ENCOUNTER — Other Ambulatory Visit: Payer: Self-pay | Admitting: Podiatry

## 2024-05-31 DIAGNOSIS — H2512 Age-related nuclear cataract, left eye: Secondary | ICD-10-CM | POA: Diagnosis not present

## 2024-05-31 DIAGNOSIS — H25812 Combined forms of age-related cataract, left eye: Secondary | ICD-10-CM | POA: Diagnosis not present

## 2024-06-11 ENCOUNTER — Other Ambulatory Visit: Payer: Self-pay | Admitting: Internal Medicine

## 2024-06-11 DIAGNOSIS — E785 Hyperlipidemia, unspecified: Secondary | ICD-10-CM

## 2024-06-14 ENCOUNTER — Ambulatory Visit (INDEPENDENT_AMBULATORY_CARE_PROVIDER_SITE_OTHER): Admitting: Podiatry

## 2024-06-14 DIAGNOSIS — Z91199 Patient's noncompliance with other medical treatment and regimen due to unspecified reason: Secondary | ICD-10-CM

## 2024-06-14 NOTE — Progress Notes (Signed)
 Cancel 24 hours

## 2024-06-16 ENCOUNTER — Emergency Department (HOSPITAL_BASED_OUTPATIENT_CLINIC_OR_DEPARTMENT_OTHER)

## 2024-06-16 ENCOUNTER — Ambulatory Visit: Payer: Self-pay

## 2024-06-16 ENCOUNTER — Emergency Department (HOSPITAL_BASED_OUTPATIENT_CLINIC_OR_DEPARTMENT_OTHER)
Admission: EM | Admit: 2024-06-16 | Discharge: 2024-06-16 | Disposition: A | Discharge status: Home / Self Care | Source: Ambulatory Visit | Attending: Emergency Medicine | Admitting: Emergency Medicine

## 2024-06-16 ENCOUNTER — Emergency Department (HOSPITAL_COMMUNITY)
Admission: EM | Admit: 2024-06-16 | Discharge: 2024-06-16 | Discharge status: Against Medical Advice | Attending: Emergency Medicine | Admitting: Emergency Medicine

## 2024-06-16 ENCOUNTER — Other Ambulatory Visit: Payer: Self-pay

## 2024-06-16 DIAGNOSIS — I700 Atherosclerosis of aorta: Secondary | ICD-10-CM | POA: Insufficient documentation

## 2024-06-16 DIAGNOSIS — K529 Noninfective gastroenteritis and colitis, unspecified: Secondary | ICD-10-CM | POA: Diagnosis not present

## 2024-06-16 DIAGNOSIS — D72829 Elevated white blood cell count, unspecified: Secondary | ICD-10-CM | POA: Insufficient documentation

## 2024-06-16 DIAGNOSIS — Z7982 Long term (current) use of aspirin: Secondary | ICD-10-CM | POA: Diagnosis not present

## 2024-06-16 DIAGNOSIS — E876 Hypokalemia: Secondary | ICD-10-CM | POA: Diagnosis not present

## 2024-06-16 DIAGNOSIS — Z79899 Other long term (current) drug therapy: Secondary | ICD-10-CM | POA: Insufficient documentation

## 2024-06-16 DIAGNOSIS — R109 Unspecified abdominal pain: Secondary | ICD-10-CM | POA: Insufficient documentation

## 2024-06-16 DIAGNOSIS — R112 Nausea with vomiting, unspecified: Secondary | ICD-10-CM | POA: Diagnosis not present

## 2024-06-16 DIAGNOSIS — K429 Umbilical hernia without obstruction or gangrene: Secondary | ICD-10-CM | POA: Diagnosis not present

## 2024-06-16 DIAGNOSIS — R1084 Generalized abdominal pain: Secondary | ICD-10-CM | POA: Diagnosis present

## 2024-06-16 DIAGNOSIS — K4090 Unilateral inguinal hernia, without obstruction or gangrene, not specified as recurrent: Secondary | ICD-10-CM | POA: Diagnosis not present

## 2024-06-16 DIAGNOSIS — Z5321 Procedure and treatment not carried out due to patient leaving prior to being seen by health care provider: Secondary | ICD-10-CM | POA: Diagnosis not present

## 2024-06-16 DIAGNOSIS — N2889 Other specified disorders of kidney and ureter: Secondary | ICD-10-CM | POA: Diagnosis not present

## 2024-06-16 DIAGNOSIS — Q438 Other specified congenital malformations of intestine: Secondary | ICD-10-CM | POA: Diagnosis not present

## 2024-06-16 LAB — CBC
HCT: 38.8 % (ref 36.0–46.0)
Hemoglobin: 13.3 g/dL (ref 12.0–15.0)
MCH: 30.4 pg (ref 26.0–34.0)
MCHC: 34.3 g/dL (ref 30.0–36.0)
MCV: 88.6 fL (ref 80.0–100.0)
Platelets: 380 K/uL (ref 150–400)
RBC: 4.38 MIL/uL (ref 3.87–5.11)
RDW: 13 % (ref 11.5–15.5)
WBC: 11.7 K/uL — ABNORMAL HIGH (ref 4.0–10.5)
nRBC: 0 % (ref 0.0–0.2)

## 2024-06-16 LAB — COMPREHENSIVE METABOLIC PANEL WITH GFR
ALT: 19 U/L (ref 0–44)
AST: 19 U/L (ref 15–41)
Albumin: 4.1 g/dL (ref 3.5–5.0)
Alkaline Phosphatase: 65 U/L (ref 38–126)
Anion gap: 11 (ref 5–15)
BUN: 10 mg/dL (ref 8–23)
CO2: 22 mmol/L (ref 22–32)
Calcium: 9.5 mg/dL (ref 8.9–10.3)
Chloride: 103 mmol/L (ref 98–111)
Creatinine, Ser: 0.89 mg/dL (ref 0.44–1.00)
GFR, Estimated: >60 mL/min
Glucose, Bld: 145 mg/dL — ABNORMAL HIGH (ref 70–99)
Potassium: 3.3 mmol/L — ABNORMAL LOW (ref 3.5–5.1)
Sodium: 136 mmol/L (ref 135–145)
Total Bilirubin: 0.7 mg/dL (ref 0.0–1.2)
Total Protein: 7.4 g/dL (ref 6.5–8.1)

## 2024-06-16 LAB — URINALYSIS, ROUTINE W REFLEX MICROSCOPIC
Bacteria, UA: NONE SEEN
Bilirubin Urine: NEGATIVE
Glucose, UA: NEGATIVE mg/dL
Ketones, ur: NEGATIVE mg/dL
Nitrite: NEGATIVE
Protein, ur: NEGATIVE mg/dL
Specific Gravity, Urine: 1.018 (ref 1.005–1.030)
pH: 6 (ref 5.0–8.0)

## 2024-06-16 LAB — LIPASE, BLOOD: Lipase: 30 U/L (ref 11–51)

## 2024-06-16 MED ORDER — POTASSIUM CHLORIDE 10 MEQ/100ML IV SOLN
10.0000 meq | Freq: Once | INTRAVENOUS | Status: AC
  Administered 2024-06-16: 10 meq via INTRAVENOUS
  Filled 2024-06-16: qty 100

## 2024-06-16 MED ORDER — SODIUM CHLORIDE 0.9 % IV BOLUS
1000.0000 mL | Freq: Once | INTRAVENOUS | Status: AC
  Administered 2024-06-16: 1000 mL via INTRAVENOUS

## 2024-06-16 MED ORDER — ONDANSETRON HCL 4 MG/2ML IJ SOLN
4.0000 mg | Freq: Once | INTRAMUSCULAR | Status: AC
  Administered 2024-06-16: 4 mg via INTRAVENOUS
  Filled 2024-06-16: qty 2

## 2024-06-16 MED ORDER — IOHEXOL 300 MG/ML  SOLN
100.0000 mL | Freq: Once | INTRAMUSCULAR | Status: AC | PRN
  Administered 2024-06-16: 100 mL via INTRAVENOUS

## 2024-06-16 MED ORDER — MORPHINE SULFATE (PF) 4 MG/ML IV SOLN
4.0000 mg | Freq: Once | INTRAVENOUS | Status: AC
Start: 2024-06-16 — End: 2024-06-16
  Administered 2024-06-16: 4 mg via INTRAVENOUS
  Filled 2024-06-16: qty 1

## 2024-06-16 NOTE — ED Triage Notes (Signed)
 Pt here from home with c/o abd pain along with n/v that started last night , hx of diverticulitis

## 2024-06-16 NOTE — ED Provider Notes (Signed)
 [place] EMERGENCY DEPARTMENT AT [facility] Provider Note   CSN: [id number] Arrival date & time: 06/16/24  1104     Patient presents with: Abdominal Pain   Lauren Orozco is a 68 y.o. female.   68 y.o female with a PMH of Depression, Diverticulitis, presents to the ED with chief complaint of generalized abdominal pain which began last night.  Patient reports a cramping sensation to her entire abdomen, and states that she had multiple episodes of vomiting last night, has not been able to keep any fluids down.  She also felt feverish, did take her temperature and she had a temperature of 99.  She did take some Tylenol  Extra Strength, and call her doctor who advised her to come to the emergency department.  She does have underlying history of diverticulitis, feels that this is somewhat similar.  Her last bowel movement was today, no blood in her stool noted.  No chest pain, no shortness of breath, no prior surgeries to her abdomen.  The history is provided by the patient.  Abdominal Pain Pain location:  Generalized Pain quality: cramping and fullness   Pain radiates to:  Does not radiate Pain severity:  Moderate Onset quality:  Gradual Duration:  1 day Timing:  Constant Progression:  Unchanged Chronicity:  Recurrent Relieved by:  Nothing Worsened by:  Movement Associated symptoms: diarrhea, nausea and vomiting   Associated symptoms: no chest pain, no chills, no constipation, no fever, no shortness of breath and no sore throat        Prior to Admission medications   Medication Sig Start Date End Date Taking? Authorizing Provider  Accu-Chek Softclix Lancets lancets Use to check blood sugar daily. DX: E11.9 04/26/20   Joshua Debby CROME, MD  amLODipine  (NORVASC ) 5 MG tablet Take 1 tablet (5 mg total) by mouth daily. 02/10/24   Joshua Debby CROME, MD  aspirin  EC 81 MG tablet Take 1 tablet (81 mg total) by mouth daily. 04/18/24   Joshua Debby CROME, MD  Blood Glucose  Monitoring Suppl (ACCU-CHEK GUIDE ME) w/Device KIT 1 Act by Does not apply route in the morning and at bedtime. 02/08/20   Joshua Debby CROME, MD  Cholecalciferol  50 MCG (2000 UT) TABS Take 2 tablets (4,000 Units total) by mouth daily. 12/24/20   Joshua Debby CROME, MD  ciclopirox  (LOPROX ) 0.77 % cream Apply topically 2 (two) times daily. 08/03/23   Joshua Debby CROME, MD  clotrimazole -betamethasone  (LOTRISONE ) cream Apply 1 Application topically daily. 12/27/23   Alvia Corean CROME, FNP  indapamide  (LOZOL ) 1.25 MG tablet Take 1 tablet (1.25 mg total) by mouth daily. 02/10/24   Joshua Debby CROME, MD  rosuvastatin  (CRESTOR ) 20 MG tablet TAKE 1 TABLET(20 MG) BY MOUTH DAILY WITH BREAKFAST 06/13/24   Joshua Debby CROME, MD    Allergies: Ciprofloxacin, Ofloxacin, and Other    Review of Systems  Constitutional:  Positive for appetite change. Negative for chills and fever.  HENT:  Negative for sore throat.   Respiratory:  Negative for shortness of breath.   Cardiovascular:  Negative for chest pain.  Gastrointestinal:  Positive for abdominal pain, diarrhea, nausea and vomiting. Negative for blood in stool and constipation.  Genitourinary:  Negative for flank pain.  Musculoskeletal:  Negative for back pain.  All other systems reviewed and are negative.   Updated Vital Signs BP (!) 156/89   Pulse 76   Temp 97.8 F (36.6 C)   Resp 18   SpO2 100%   Physical Exam Vitals and  nursing note reviewed.  Constitutional:      Appearance: She is well-developed. She is ill-appearing.  HENT:     Head: Normocephalic and atraumatic.  Cardiovascular:     Rate and Rhythm: Normal rate.  Pulmonary:     Effort: Pulmonary effort is normal.     Breath sounds: No wheezing.  Abdominal:     General: Abdomen is flat.     Palpations: Abdomen is soft.     Tenderness: There is generalized abdominal tenderness. There is no right CVA tenderness, left CVA tenderness, guarding or rebound.     Hernia: A hernia is present. Hernia is  present in the umbilical area.  Neurological:     Mental Status: She is alert.     (all labs ordered are listed, but only abnormal results are displayed) Labs Reviewed - No data to display  EKG: None  Radiology: CT ABDOMEN PELVIS W CONTRAST Result Date: 06/16/2024 CLINICAL DATA:  Abdominal pain, acute, nonlocalized EXAM: CT ABDOMEN AND PELVIS WITH CONTRAST TECHNIQUE: Multidetector CT imaging of the abdomen and pelvis was performed using the standard protocol following bolus administration of intravenous contrast. RADIATION DOSE REDUCTION: This exam was performed according to the departmental dose-optimization program which includes automated exposure control, adjustment of the mA and/or kV according to patient size and/or use of iterative reconstruction technique. CONTRAST:  OMNIPAQUE  IOHEXOL  300 MG/ML  SOLN COMPARISON:  CT abdomen/pelvis dated 04/05/2019. FINDINGS: Lower chest: Mild bibasilar linear subsegmental atelectasis/scarring. Hepatobiliary: No focal liver abnormality is seen. No gallstones, gallbladder wall thickening, or biliary dilatation. Pancreas: Unremarkable. No pancreatic ductal dilatation or surrounding inflammatory changes. Spleen: Normal in size without focal abnormality. Adrenals/Urinary Tract: Stable 2.6 cm hypoattenuating right adrenal gland nodule, most compatible with a benign adenoma, for which no follow-up imaging is recommended. Left adrenal gland is unremarkable. Kidneys enhance symmetrically. Unchanged 1.7 cm cyst arising from the interpolar right kidney which measures slightly greater than simple fluid attenuation, favored to represent proteinaceous/hemorrhagic contents. Subcentimeter focal hypodensities in the left kidney are too small to definitively characterize. No renal calculi or hydronephrosis. Bladder is unremarkable given the degree of distention. Stomach/Bowel: Stomach is within normal limits. Status post appendectomy. Circumferential wall thickening with  mucosal hyperenhancement and surrounding haziness of the distal transverse colon to the level of the splenic flexure. No obstruction. Redundant course of the sigmoid colon. Vascular/Lymphatic: Abdominal aorta is normal in caliber with atherosclerotic calcification. No enlarged abdominal or pelvic lymph nodes. Reproductive: Status post hysterectomy. No adnexal masses. Other: No abdominopelvic ascites. No intraperitoneal free air. Similar small fat containing umbilical and left inguinal hernias. Musculoskeletal: No acute osseous abnormality. No suspicious osseous lesion. Degenerative disc changes at L4-L5 with posterior disc bulging. IMPRESSION: 1. Circumferential wall thickening with mucosal hyperenhancement and surrounding haziness of the distal transverse colon to the level of the splenic flexure. These findings are suggestive of a nonspecific colitis, although, underlying neoplastic process can not be excluded. Recommend correlation with colonoscopy. 2. Additional nonacute findings, as described above. 3.  Aortic Atherosclerosis (ICD10-I70.0). Electronically Signed   By: Harrietta Sherry M.D.   On: 06/16/2024 12:49     Procedures   Medications Ordered in the ED  iohexol  (OMNIPAQUE ) 300 MG/ML solution 100 mL (100 mLs Intravenous Contrast Given 06/16/24 1216)  sodium chloride  0.9 % bolus 1,000 mL (1,000 mLs Intravenous New Bag/Given 06/16/24 1351)  morphine  (PF) 4 MG/ML injection 4 mg (4 mg Intravenous Given 06/16/24 1349)  potassium chloride  10 mEq in 100 mL IVPB (10 mEq Intravenous New  Bag/Given 06/16/24 1352)  ondansetron  (ZOFRAN ) injection 4 mg (4 mg Intravenous Given 06/16/24 1348)                                    Medical Decision Making Amount and/or Complexity of Data Reviewed Radiology: ordered.  Risk Prescription drug management.   This patient presents to the ED for concern of abdominal pain, this involves a number of treatment options, and is a complaint that carries with it a high  risk of complications and morbidity.  The differential diagnosis includes reticulitis, obstruction, viral illness.   Co morbidities: Discussed in HPI  Brief History:  See HPI  EMR reviewed including pt PMHx, past surgical history and past visits to ER.   See HPI for more details  Lab Tests:  I ordered and independently interpreted labs.  The pertinent results include:    I personally reviewed all laboratory work and imaging. Metabolic panel without any acute abnormality specifically kidney function within normal limits and no significant electrolyte abnormalities. CBC without leukocytosis or significant anemia. Does have mild hypokalemia therefore replaced while in the ED.  UA with leukocytes but no nitrites or white blood cell count.  Imaging Studies:  CT Abdomen and pelvis showed: IMPRESSION:  1. Circumferential wall thickening with mucosal hyperenhancement and  surrounding haziness of the distal transverse colon to the level of  the splenic flexure. These findings are suggestive of a nonspecific  colitis, although, underlying neoplastic process can not be  excluded. Recommend correlation with colonoscopy.  2. Additional nonacute findings, as described above.  3.  Aortic Atherosclerosis (ICD10-I70.0).   Medicines ordered:  I ordered medication including zofran ,morphine , bolus  for symptomatic tx. Reevaluation of the patient after these medicines showed that the patient improved I have reviewed the patients home medicines and have made adjustments as needed  Reevaluation:  After the interventions noted above I re-evaluated patient and found that they have :improved  Social Determinants of Health:  The patient's social determinants of health were a factor in the care of this patient  Problem List / ED Course:  Patient presented to the ED with a chief complaint of abdominal pain which has been ongoing since last night, does have underlying history of diverticulitis,  feels that this episode is similar to.  She has had some nausea, vomiting.  Last oral intake was yesterday with a regular bowel movement and no blood in her stool or her emesis.  She previously tried getting to [facility], reports that due to the long wait she left there and came to [facility]. Blood work was reviewed from home prior ED visit at [facility].  EMP with slight decrease in potassium, this was replaced through her IV.  Creatinine is within normal limits.  LFTs are within normal limits.  CBC with a mild leukocytosis of 11.7.  UA with no nitrites or leukocytes to suggest infection.  Lipase levels normal. Abdomen remains soft, non-tender, guarding or rebound noted.  Vitals are otherwise within normal limits.  She was provided with potassium replacement, bolus, nausea medication along with pain control.  Upon serial abdominal exams and recheck does appear better, she is tolerating p.o.  She does report improvement in symptoms. We discussed appropriate follow-up with outpatient PCP, along with return to the ED if she experiences any fever or worsening symptoms.  Hemodynamically stable for discharge.  Dispostion:  After consideration of the diagnostic results  and the patients response to treatment, I feel that the patent would benefit from close follow up with PCP.    Portions of this note were generated with Scientist, clinical (histocompatibility and immunogenetics). Dictation errors may occur despite best attempts at proofreading.   Final diagnoses:  Generalized abdominal pain  Colitis    ED Discharge Orders     None          Maureen Broad, PA-C 06/16/24 1503    Ruthe Cornet, DO 06/17/24 1520

## 2024-06-16 NOTE — Discharge Instructions (Addendum)
 You received potassium replacement during your ED visit.  We discussed the results of your CT abdomen along with provided with a copy of it.   Please continue to follow-up with a bland diet for the next few days.  Return to the emergency department if your symptoms worsen.

## 2024-06-16 NOTE — Telephone Encounter (Signed)
 FYI Only or Action Required?: FYI only for provider.  Patient was last seen in primary care on 04/17/2024 by Ozell Heron HERO, MD.  Called Nurse Triage reporting Advice Only.  Symptoms began today.  Interventions attempted: Nothing.  Symptoms are: gradually worsening.  Triage Disposition: No disposition on file.  Patient/caregiver understands and will follow disposition?:     Copied from CRM [id number]. Topic: Clinical - Red Word Triage >> Jun 16, 2024 10:35 AM Lauren Orozco wrote: Red Word that prompted transfer to Nurse Triage: Patient is currently in the ED and has been there for the last 5 hours with diverticulosis. She has not been seen yet but is in severe pain. Reason for Disposition  Health information question, no triage required and triager able to answer question  Answer Assessment - Initial Assessment Questions 1. REASON FOR CALL: What is the main reason for your call? or How can I best help you?     Currently in ER for diverticulitis; this nurse told patient to stay in ER and to let staff know she is having pain.  Patient agreed and denies questions.  Protocols used: Information Only Call - No Triage-A-AH

## 2024-06-16 NOTE — Telephone Encounter (Signed)
 Patient in ED.

## 2024-06-16 NOTE — Telephone Encounter (Signed)
 Patient disconnected line prior to warm transfer.

## 2024-06-16 NOTE — ED Notes (Signed)
 Pt requested coke and peanut butter crackers.

## 2024-06-16 NOTE — ED Triage Notes (Signed)
 Pt caox4, ambulatory c/o abd pain, N/V since last night. PMH diverticulitis, states s/s feel similar.

## 2024-06-16 NOTE — Telephone Encounter (Signed)
 Copied from CRM #[id number]. Topic: Clinical - Red Word Triage >> Jun 16, 2024 10:35 AM Franky GRADE wrote: Red Word that prompted transfer to Nurse Triage: Patient is currently in the ED and has been there for the last 5 hours with diverticulosis. She has not been seen yet but is in severe pain.

## 2024-07-13 ENCOUNTER — Ambulatory Visit

## 2024-07-26 ENCOUNTER — Other Ambulatory Visit: Payer: Self-pay | Admitting: Internal Medicine

## 2024-07-26 DIAGNOSIS — I10 Essential (primary) hypertension: Secondary | ICD-10-CM

## 2024-08-07 ENCOUNTER — Encounter: Payer: Self-pay | Admitting: Radiology

## 2024-08-08 ENCOUNTER — Other Ambulatory Visit: Payer: Self-pay | Admitting: Internal Medicine

## 2024-08-08 DIAGNOSIS — I10 Essential (primary) hypertension: Secondary | ICD-10-CM

## 2024-08-08 NOTE — Telephone Encounter (Unsigned)
 Copied from CRM [id number]. Topic: Clinical - Medication Refill >> Aug 08, 2024 10:45 AM Harlene ORN wrote: Medication: indapamide  (LOZOL ) 1.25 MG tablet  Has the patient contacted their pharmacy? Yes (Agent: If no, request that the patient contact the pharmacy for the refill. If patient does not wish to contact the pharmacy document the reason why and proceed with request.) (Agent: If yes, when and what did the pharmacy advise?)  This is the patient's preferred pharmacy:  [facility] [phone] - [place], [place] - [address] AT [facility] [address] [place] [address] Phone: [phone] Fax: [phone]  Is this the correct pharmacy for this prescription? Yes If no, delete pharmacy and type the correct one.   Has the prescription been filled recently? No  Is the patient out of the medication? No  Has the patient been seen for an appointment in the last year OR does the patient have an upcoming appointment? Yes  Can we respond through MyChart? Yes  Agent: Please be advised that Rx refills may take up to 3 business days. We ask that you follow-up with your pharmacy.

## 2024-08-09 ENCOUNTER — Ambulatory Visit: Payer: Self-pay | Admitting: Internal Medicine

## 2024-08-09 ENCOUNTER — Ambulatory Visit: Admitting: Internal Medicine

## 2024-08-09 ENCOUNTER — Ambulatory Visit (INDEPENDENT_AMBULATORY_CARE_PROVIDER_SITE_OTHER)

## 2024-08-09 VITALS — BP 138/82 | HR 72 | Temp 98.3°F | Resp 16 | Ht 66.0 in | Wt 155.0 lb

## 2024-08-09 DIAGNOSIS — E876 Hypokalemia: Secondary | ICD-10-CM

## 2024-08-09 DIAGNOSIS — R052 Subacute cough: Secondary | ICD-10-CM | POA: Insufficient documentation

## 2024-08-09 DIAGNOSIS — E118 Type 2 diabetes mellitus with unspecified complications: Secondary | ICD-10-CM

## 2024-08-09 DIAGNOSIS — R0609 Other forms of dyspnea: Secondary | ICD-10-CM

## 2024-08-09 DIAGNOSIS — I10 Essential (primary) hypertension: Secondary | ICD-10-CM | POA: Diagnosis not present

## 2024-08-09 MED ORDER — AMLODIPINE BESYLATE 5 MG PO TABS: 5.0000 mg | ORAL_TABLET | Freq: Every day | ORAL | 1 refills | Status: AC

## 2024-08-09 MED ORDER — COVID-19 MRNA VAC-TRIS(PFIZER) 30 MCG/0.3ML IM SUSY: 0.3000 mL | PREFILLED_SYRINGE | Freq: Once | INTRAMUSCULAR | 0 refills | Status: AC

## 2024-08-09 NOTE — Progress Notes (Signed)
 Subjective:  Patient ID: Lauren Orozco, female    DOB: [birth year]  Age: 68 y.o. MRN: [id number]  CC: Hypertension, Cough, and Diabetes   HPI Lauren Orozco presents for f/up ----  Discussed the use of AI scribe software for clinical note transcription with the patient, who gave verbal consent to proceed.  History of Present Illness Lauren Orozco is a 68 year old female who presents with shortness of breath.  She has experienced shortness of breath intermittently for the past week, which she attributes to stress related to family caregiving responsibilities. The shortness of breath is not associated with chest pain. No wheezing, fever, or chills are present. She has a cough that is productive of thick phlegm but no sore throat.  She has a history of cataract surgery on one eye two months ago and is awaiting surgery on the other eye.    Outpatient Medications Prior to Visit  Medication Sig Dispense Refill   Accu-Chek Softclix Lancets lancets Use to check blood sugar daily. DX: E11.9 100 each 3   aspirin  EC 81 MG tablet Take 1 tablet (81 mg total) by mouth daily. 90 tablet 1   Blood Glucose Monitoring Suppl (ACCU-CHEK GUIDE ME) w/Device KIT 1 Act by Does not apply route in the morning and at bedtime. 1 kit 0   Cholecalciferol  50 MCG (2000 UT) TABS Take 2 tablets (4,000 Units total) by mouth daily. 180 tablet 1   ciclopirox  (LOPROX ) 0.77 % cream Apply topically 2 (two) times daily. 90 g 1   clotrimazole -betamethasone  (LOTRISONE ) cream Apply 1 Application topically daily. 45 g 0   indapamide  (LOZOL ) 1.25 MG tablet Take 1 tablet (1.25 mg total) by mouth daily. 90 tablet 1   rosuvastatin  (CRESTOR ) 20 MG tablet TAKE 1 TABLET(20 MG) BY MOUTH DAILY WITH BREAKFAST 90 tablet 0   amLODipine  (NORVASC ) 5 MG tablet Take 1 tablet (5 mg total) by mouth daily. 90 tablet 1   No facility-administered medications prior to visit.    ROS Review of Systems  Constitutional:   Negative for appetite change, chills, diaphoresis, fatigue and fever.  HENT: Negative.    Eyes:  Negative for visual disturbance.  Respiratory:  Positive for cough and shortness of breath. Negative for chest tightness and wheezing.   Cardiovascular:  Negative for chest pain, palpitations and leg swelling.  Gastrointestinal: Negative.  Negative for abdominal pain, diarrhea, nausea and vomiting.  Genitourinary: Negative.  Negative for difficulty urinating.  Musculoskeletal:  Positive for arthralgias. Negative for myalgias.  Skin: Negative.   Neurological: Negative.  Negative for dizziness and weakness.  Hematological:  Negative for adenopathy. Does not bruise/bleed easily.  Psychiatric/Behavioral: Negative.      Objective:  BP 138/82 (BP Location: Right Arm, Patient Position: Sitting, Cuff Size: Normal)   Pulse 72   Temp 98.3 F (36.8 C) (Oral)   Resp 16   Ht 5' 6 (1.676 m)   Wt 155 lb (70.3 kg)   SpO2 99%   BMI 25.02 kg/m   BP Readings from Last 3 Encounters:  08/09/24 138/82  06/16/24 (!) 142/81  06/16/24 131/79    Wt Readings from Last 3 Encounters:  08/09/24 155 lb (70.3 kg)  06/16/24 145 lb (65.8 kg)  04/27/24 155 lb 3.2 oz (70.4 kg)    Physical Exam Vitals reviewed.  Constitutional:      Appearance: Normal appearance.  HENT:     Mouth/Throat:     Mouth: Mucous membranes are moist.  Eyes:  General: No scleral icterus.    Conjunctiva/sclera: Conjunctivae normal.  Cardiovascular:     Rate and Rhythm: Normal rate and regular rhythm.     Heart sounds: Normal heart sounds, S1 normal and S2 normal. No murmur heard.    No friction rub. No gallop.     Comments: EKG --- NSR, 74 bpm LAE Low voltage in antero/septal region is not new No acute ST/T wave changes Unchanged  Pulmonary:     Effort: Pulmonary effort is normal. No respiratory distress.     Breath sounds: No stridor. No wheezing, rhonchi or rales.  Abdominal:     General: Abdomen is flat. Bowel  sounds are normal.     Palpations: There is no mass.     Tenderness: There is no abdominal tenderness. There is no guarding or rebound.     Hernia: No hernia is present.  Musculoskeletal:     Cervical back: Neck supple.     Right lower leg: No edema.     Left lower leg: No edema.  Lymphadenopathy:     Cervical: No cervical adenopathy.  Skin:    General: Skin is warm and dry.  Neurological:     General: No focal deficit present.     Mental Status: She is alert. Mental status is at baseline.  Psychiatric:        Mood and Affect: Mood normal.        Behavior: Behavior normal.     Lab Results  Component Value Date   WBC 11.7 (H) 06/16/2024   HGB 13.3 06/16/2024   HCT 38.8 06/16/2024   PLT 380 06/16/2024   GLUCOSE 115 (H) 08/09/2024   CHOL 144 04/05/2024   TRIG 53.0 04/05/2024   HDL 55.90 04/05/2024   LDLDIRECT 150.8 10/19/2011   LDLCALC 77 04/05/2024   ALT 19 06/16/2024   AST 19 06/16/2024   NA 144 08/09/2024   K 3.3 (L) 06/16/2024   CL 105 08/09/2024   CREATININE 0.85 08/09/2024   BUN 14 08/09/2024   CO2 22 08/09/2024   TSH 1.22 08/09/2024   HGBA1C 6.5 (H) 08/09/2024   MICROALBUR <0.7 04/05/2024    CT ABDOMEN PELVIS W CONTRAST Result Date: 06/16/2024 CLINICAL DATA:  Abdominal pain, acute, nonlocalized EXAM: CT ABDOMEN AND PELVIS WITH CONTRAST TECHNIQUE: Multidetector CT imaging of the abdomen and pelvis was performed using the standard protocol following bolus administration of intravenous contrast. RADIATION DOSE REDUCTION: This exam was performed according to the departmental dose-optimization program which includes automated exposure control, adjustment of the mA and/or kV according to patient size and/or use of iterative reconstruction technique. CONTRAST:  OMNIPAQUE  IOHEXOL  300 MG/ML  SOLN COMPARISON:  CT abdomen/pelvis dated 04/05/2019. FINDINGS: Lower chest: Mild bibasilar linear subsegmental atelectasis/scarring. Hepatobiliary: No focal liver abnormality is  seen. No gallstones, gallbladder wall thickening, or biliary dilatation. Pancreas: Unremarkable. No pancreatic ductal dilatation or surrounding inflammatory changes. Spleen: Normal in size without focal abnormality. Adrenals/Urinary Tract: Stable 2.6 cm hypoattenuating right adrenal gland nodule, most compatible with a benign adenoma, for which no follow-up imaging is recommended. Left adrenal gland is unremarkable. Kidneys enhance symmetrically. Unchanged 1.7 cm cyst arising from the interpolar right kidney which measures slightly greater than simple fluid attenuation, favored to represent proteinaceous/hemorrhagic contents. Subcentimeter focal hypodensities in the left kidney are too small to definitively characterize. No renal calculi or hydronephrosis. Bladder is unremarkable given the degree of distention. Stomach/Bowel: Stomach is within normal limits. Status post appendectomy. Circumferential wall thickening with mucosal hyperenhancement and surrounding  haziness of the distal transverse colon to the level of the splenic flexure. No obstruction. Redundant course of the sigmoid colon. Vascular/Lymphatic: Abdominal aorta is normal in caliber with atherosclerotic calcification. No enlarged abdominal or pelvic lymph nodes. Reproductive: Status post hysterectomy. No adnexal masses. Other: No abdominopelvic ascites. No intraperitoneal free air. Similar small fat containing umbilical and left inguinal hernias. Musculoskeletal: No acute osseous abnormality. No suspicious osseous lesion. Degenerative disc changes at L4-L5 with posterior disc bulging. IMPRESSION: 1. Circumferential wall thickening with mucosal hyperenhancement and surrounding haziness of the distal transverse colon to the level of the splenic flexure. These findings are suggestive of a nonspecific colitis, although, underlying neoplastic process can not be excluded. Recommend correlation with colonoscopy. 2. Additional nonacute findings, as described  above. 3.  Aortic Atherosclerosis (ICD10-I70.0). Electronically Signed   By: Harrietta Sherry M.D.   On: 06/16/2024 12:49   DG Chest 2 View Result Date: 08/09/2024 CLINICAL DATA:  Cough and shortness of breath 1 week. EXAM: CHEST - 2 VIEW COMPARISON:  04/16/2024 FINDINGS: Lungs are adequately inflated and otherwise clear. Cardiomediastinal silhouette and remainder of the exam is unchanged. IMPRESSION: No active cardiopulmonary disease. Electronically Signed   By: Toribio Agreste M.D.   On: 08/09/2024 09:33       Assessment & Plan:  Subacute cough -     DG Chest 2 View; Future  DOE (dyspnea on exertion)- EKG and labs are reassuring. -     EKG 12-Lead -     Troponin I (High Sensitivity); Future -     Troponin I -; Future -     Brain natriuretic peptide; Future  Essential hypertension- BP is well controlled. -     EKG 12-Lead -     Basic metabolic panel with GFR; Future -     TSH; Future -     amLODIPine  Besylate; Take 1 tablet (5 mg total) by mouth daily.  Dispense: 90 tablet; Refill: 1  Type II diabetes mellitus with manifestations (HCC)- Blood sugar is well controlled. -     Basic metabolic panel with GFR; Future -     Hemoglobin A1c; Future -     COVID-19 mRNA Vac-TriS(Pfizer); Inject 0.3 mLs into the muscle once for 1 dose.  Dispense: 0.3 mL; Refill: 0  Chronic hypokalemia -     Magnesium; Future  Other orders -     EKG -     Basic Metabolic Panel  EGFR     Follow-up: Return in about 6 months (around 02/06/2025).  Debby Molt, MD

## 2024-08-09 NOTE — Patient Instructions (Signed)

## 2024-08-12 ENCOUNTER — Encounter: Payer: Self-pay | Admitting: Internal Medicine

## 2024-08-12 LAB — BASIC METABOLIC PANEL  EGFR
BUN: 14 mg/dL (ref 7–25)
CO2: 22 mmol/L (ref 20–32)
Calcium: 10.3 mg/dL (ref 8.6–10.4)
Chloride: 105 mmol/L (ref 98–110)
Creat: 0.85 mg/dL (ref 0.50–1.05)
Glucose, Bld: 115 mg/dL — ABNORMAL HIGH (ref 65–99)
POTASSIUM: 4.3 mmol/L (ref 3.4–4.8)
Sodium: 144 mmol/L (ref 135–146)
eGFR: 75 mL/min/1.73m2 (ref >=60)

## 2024-08-12 LAB — TROPONIN I: Troponin I: 3 ng/L (ref <=47)

## 2024-08-12 LAB — BRAIN NATRIURETIC PEPTIDE: Brain Natriuretic Peptide: 10 pg/mL (ref <100)

## 2024-08-12 LAB — HEMOGLOBIN A1C
Hgb A1c MFr Bld: 6.5 % — ABNORMAL HIGH (ref <5.7)
Mean Plasma Glucose: 140 mg/dL
eAG (mmol/L): 7.7 mmol/L

## 2024-08-12 LAB — TSH: TSH: 1.22 m[IU]/L (ref 0.40–4.50)

## 2024-08-12 LAB — MAGNESIUM: Magnesium: 2 mg/dL (ref 1.5–2.5)

## 2024-08-12 MED ORDER — SPIRONOLACTONE 25 MG PO TABS: 25.0000 mg | ORAL_TABLET | Freq: Every day | ORAL | 0 refills | Status: AC

## 2024-08-16 ENCOUNTER — Telehealth: Payer: Self-pay | Admitting: *Deleted

## 2024-08-16 NOTE — Progress Notes (Signed)
 Care Guide Pharmacy Note  08/16/2024 Name: Lauren Orozco MRN: [id number] DOB: 1956/06/03  Referred By: Joshua Debby CROME, MD Reason for referral: Complex Care Management (Outreach to schedule referral with pharmacist )   Lauren Orozco is a 68 y.o. year old female who is a primary care patient of Joshua Debby CROME, MD.  Lauren Orozco was referred to the pharmacist for assistance related to: DMII  Successful contact was made with the patient to discuss pharmacy services including being ready for the pharmacist to call at least 5 minutes before the scheduled appointment time and to have medication bottles and any blood pressure readings ready for review. The patient agreed to meet with the pharmacist via telephone visit on 08/23/2024  Lauren Orozco, CMA [place]  [facility], [facility] Guide Direct Dial: [phone]  Fax: [phone] Website: [email or web address]

## 2024-08-17 ENCOUNTER — Other Ambulatory Visit: Payer: Self-pay | Admitting: Internal Medicine

## 2024-08-17 DIAGNOSIS — R0609 Other forms of dyspnea: Secondary | ICD-10-CM

## 2024-08-17 DIAGNOSIS — I2510 Atherosclerotic heart disease of native coronary artery without angina pectoris: Secondary | ICD-10-CM

## 2024-08-23 ENCOUNTER — Other Ambulatory Visit: Admitting: Pharmacist

## 2024-08-23 DIAGNOSIS — I10 Essential (primary) hypertension: Secondary | ICD-10-CM

## 2024-08-23 DIAGNOSIS — E118 Type 2 diabetes mellitus with unspecified complications: Secondary | ICD-10-CM

## 2024-08-23 NOTE — Progress Notes (Signed)
 08/23/2024 Name: Lauren Orozco MRN: [id number] DOB: Sep 26, 1956  Chief Complaint  Patient presents with   Hypertension   Diabetes   Medication Management    Lauren Orozco is a 68 y.o. year old female who presented for a telephone visit.   They were referred to the pharmacist by their PCP for assistance in managing diabetes and hypertension.   Subjective:  Care Team: Primary Care Provider: Joshua Debby CROME, MD ; Next Scheduled Visit: none scheduled  Medication Access/Adherence  Current Pharmacy:  [facility] [phone] - [place], [place] - [address] AT [facility] [address] [place] [address] Phone: [phone] Fax: [phone]   Patient reports affordability concerns with their medications: No  Patient reports access/transportation concerns to their pharmacy: No  Patient reports adherence concerns with their medications:  No     Diabetes: Current medications: none Medications tried in the past: none  Macrovascular and Microvascular Risk Reduction:  Statin? yes (rosuvastatin  20 mg); ACEi/ARB? no; therapy not indicated  Last urinary albumin/creatinine ratio:  Lab Results  Component Value Date   MICRALBCREAT Unable to calculate 04/05/2024   Last eye exam:  Lab Results  Component Value Date   HMDIABEYEEXA No Retinopathy 05/20/2023   Last foot exam: 04/05/2024 Tobacco Use:  Tobacco Use: Medium Risk (08/09/2024)   Patient History    Smoking Tobacco Use: Former    Smokeless Tobacco Use: Never    Passive Exposure: Not on file   Hypertension:  Current medications: amlodipine  5 mg daily, spironolactone  25 mg daily Medications previously tried: indapamide  (hypokalemia)  Patient has a validated, automated, upper arm home BP cuff Current blood pressure readings readings: 120s/70-80s   Current physical activity: has not been walking as much lately due to taking care of family  members   Objective:  Lab Results  Component Value Date   HGBA1C 6.5 (H) 08/09/2024    Lab Results  Component Value Date   CREATININE 0.85 08/09/2024   BUN 14 08/09/2024   NA 144 08/09/2024   K 3.3 (L) 06/16/2024   CL 105 08/09/2024   CO2 22 08/09/2024    Lab Results  Component Value Date   CHOL 144 04/05/2024   HDL 55.90 04/05/2024   LDLCALC 77 04/05/2024   LDLDIRECT 150.8 10/19/2011   TRIG 53.0 04/05/2024   CHOLHDL 3 04/05/2024    Medications Reviewed Today     Reviewed by Merceda Lela SAUNDERS, RPH (Pharmacist) on 08/23/24 at 1000  Med List Status: <None>   Medication Order Taking? Sig Documenting Provider Last Dose Status Informant  Accu-Chek Softclix Lancets lancets [id number]  Use to check blood sugar daily. DX: E11.9 Joshua Debby CROME, MD  Active   amLODipine  (NORVASC ) 5 MG tablet [id number] Yes Take 1 tablet (5 mg total) by mouth daily. Joshua Debby CROME, MD  Active   aspirin  EC 81 MG tablet [id number] Yes Take 1 tablet (81 mg total) by mouth daily. Joshua Debby CROME, MD  Active   Blood Glucose Monitoring Suppl (ACCU-CHEK GUIDE ME) w/Device KIT [id number]  1 Act by Does not apply route in the morning and at bedtime. Joshua Debby CROME, MD  Active   Cholecalciferol  50 MCG (2000 UT) TABS [id number] Yes Take 2 tablets (4,000 Units total) by mouth daily. Joshua Debby CROME, MD  Active   ciclopirox  (LOPROX ) 0.77 % cream [id number]  Apply topically 2 (two) times daily. Joshua Debby CROME, MD  Active   clotrimazole -betamethasone  (LOTRISONE ) cream [id number]  Apply 1 Application topically daily. Alvia Corean CROME, FNP  Active   rosuvastatin  (CRESTOR ) 20 MG tablet [id number] Yes TAKE 1 TABLET(20 MG) BY MOUTH DAILY WITH BREAKFAST Joshua Debby CROME, MD  Active   spironolactone (ALDACTONE) 25 MG tablet [id number] Yes Take 1 tablet (25 mg total) by mouth daily. Joshua Debby CROME, MD  Active               Assessment/Plan:   Diabetes: - Currently controlled; goal A1c <7%. Cardiorenal risk  reduction is optimized.. Blood pressure is at goal <130/80. LDL is not at goal. LDL goal <55. - Reviewed goal A1c - Reviewed dietary and lifestyle modifications including watching carbohydrate intake, increased protein and hydration, increased movement - No pharmacotherapy needed at this time - Consider increase rosuvastatin  to 40 mg if LDL remains above 55 to optimize cardiac risk reduction  Hypertension: - Currently controlled, BP goal <130/80 - Recommended to check home blood pressure and heart rate  - Recommend to continue amlodipine  and spironolactone - Walk-in lab for BMP first week of December to check potassium and renal function after starting spironolactone    Follow Up Plan: labs in 2 weeks  Darrelyn Drum, PharmD, BCPS, CPP Clinical Pharmacist Practitioner [facility] [phone]

## 2024-10-24 NOTE — Progress Notes (Unsigned)
 " Cardiology Office Note:    Date:  10/25/2024   ID:  Lauren Orozco, DOB 1955/11/16, MRN [id number]  PCP:  Joshua Debby CROME, MD  Cardiologist:  None  Electrophysiologist:  None   Referring MD: Joshua Debby CROME, MD   Chief Complaint  Patient presents with   Coronary Artery Disease    History of Present Illness:    Lauren Orozco is a 69 y.o. female with a hx of lymphoma, hypertension, hyperlipidemia who is referred for evaluation for CAD.  Calcium  score 04/2024 was 212 (90th percentile).    Reports has been having pain in back but also sometimes feels pain in chest.  Describes as tightness in center of her chest, mostly feels at night.  No clear relationship with exertion.  Does report she gets short of breath.  Reports some lightheadedness when getting up in the morning, denies any syncope.  No lower extremity edema.  Reports she quit smoking in fall 2025, started smoking age 89 and at peak was smoking 1 pack/day.  Family history includes her father died of an MI in 92s.    Past Medical History:  Diagnosis Date   Anxiety    Chronic neck pain    with radiculopathy   Chronic pain of left wrist 2009   since neck surgery   Complication of anesthesia    Hard to wake up per pt; after neck sx had to be bagged.   DEPRESSION    DISC DISEASE, CERVICAL    Diverticulitis    HYPERLIPIDEMIA    Hypertension    Lymphocytosis 01/02/2016   NEPHROLITHIASIS, HX OF    NHL (non-Hodgkin's lymphoma) (HCC) 12/25/2014   Recurrent abdominal pain    Umbilical hernia    UTI'S, CHRONIC     Past Surgical History:  Procedure Laterality Date   ABDOMINAL HYSTERECTOMY  2001   APPENDECTOMY  1981   KIDNEY STONE SURGERY  2007   LEFT OOPHORECTOMY  2001   MYOMECTOMY  1981   NECK SURGERY  2009   fusion of C1, C2, C3    Current Medications: Active Medications[1]   Allergies:   Ciprofloxacin, Ofloxacin, and Other   Social History   Socioeconomic History   Marital status: Single     Spouse name: Not on file   Number of children: 1   Years of education: HSG   Highest education level: 12th grade  Occupational History   Occupation: Disabled  Tobacco Use   Smoking status: Former    Current packs/day: 0.00    Average packs/day: 0.9 packs/day for 30.0 years (27.0 ttl pk-yrs)    Types: Cigarettes    Start date: 12/17/1989    Quit date: 12/18/2019    Years since quitting: 4.8   Smokeless tobacco: Never  Vaping Use   Vaping status: Never Used  Substance and Sexual Activity   Alcohol use: No   Drug use: No   Sexual activity: Not Currently  Other Topics Concern   Not on file  Social History Narrative   HSG, [employer]-CNA (in process). [place]. Daughter in '91 [facility] [place]. Work-disability due to neck and arthritis problems. Lives-alone. No history of physical or sexual abuse   Social Drivers of Health   Tobacco Use: Medium Risk (08/09/2024)   Patient History    Smoking Tobacco Use: Former    Smokeless Tobacco Use: Never    Passive Exposure: Not on file  Financial Resource Strain: Medium Risk (08/09/2024)   Overall Financial Resource Strain (CARDIA)  Difficulty of Paying Living Expenses: Somewhat hard  Food Insecurity: Food Insecurity Present (08/09/2024)   Epic    Worried About Programme Researcher, Broadcasting/film/video in the Last Year: Sometimes true    Ran Out of Food in the Last Year: Sometimes true  Transportation Needs: Unmet Transportation Needs (08/09/2024)   Epic    Lack of Transportation (Medical): Yes    Lack of Transportation (Non-Medical): Yes  Physical Activity: Insufficiently Active (08/09/2024)   Exercise Vital Sign    Days of Exercise per Week: 2 days    Minutes of Exercise per Session: 30 min  Stress: No Stress Concern Present (08/09/2024)   [employer] of Occupational Health - Occupational Stress Questionnaire    Feeling of Stress: Not at all  Social Connections: Unknown (08/09/2024)   Social Connection and Isolation Panel    Frequency of Communication  with Friends and Family: More than three times a week    Frequency of Social Gatherings with Friends and Family: More than three times a week    Attends Religious Services: More than 4 times per year    Active Member of Clubs or Organizations: Yes    Attends Banker Meetings: More than 4 times per year    Marital Status: Patient declined  Depression (PHQ2-9): Low Risk (07/27/2023)   Depression (PHQ2-9)    PHQ-2 Score: 4  Alcohol Screen: Low Risk (03/23/2022)   Alcohol Screen    Last Alcohol Screening Score (AUDIT): 0  Housing: High Risk (08/09/2024)   Epic    Unable to Pay for Housing in the Last Year: Yes    Number of Times Moved in the Last Year: Not on file    Homeless in the Last Year: No  Utilities: Not At Risk (07/27/2023)   AHC Utilities    Threatened with loss of utilities: No  Health Literacy: Adequate Health Literacy (07/27/2023)   B1300 Health Literacy    Frequency of need for help with medical instructions: Never     Family History: The patient's family history includes Diabetes in her father and mother; Heart attack in her father; Heart disease in her father; Hyperlipidemia in her father and mother; Hypertension in her father and mother. There is no history of Cancer or Colon cancer.  ROS:   Please see the history of present illness.     All other systems reviewed and are negative.  EKGs/Labs/Other Studies Reviewed:    The following studies were reviewed today:   EKG:   08/09/2024: Normal sinus rhythm, rate 88, no ST abnormalities  Recent Labs: 06/16/2024: ALT 19; Hemoglobin 13.3; Platelets 380; Potassium 3.3 08/09/2024: Brain Natriuretic Peptide 10; BUN 14; Creat 0.85; Magnesium 2.0; Sodium 144; TSH 1.22  Recent Lipid Panel    Component Value Date/Time   CHOL 144 04/05/2024 1425   TRIG 53.0 04/05/2024 1425   HDL 55.90 04/05/2024 1425   CHOLHDL 3 04/05/2024 1425   VLDL 10.6 04/05/2024 1425   LDLCALC 77 04/05/2024 1425   LDLDIRECT 150.8  10/19/2011 1336    Physical Exam:    VS:  BP 138/82 (BP Location: Left Arm, Patient Position: Sitting, Cuff Size: Normal)   Pulse 74   Ht 5' 7.61 (1.717 m)   Wt 158 lb 9.6 oz (71.9 kg)   SpO2 97%   BMI 24.40 kg/m     Wt Readings from Last 3 Encounters:  10/25/24 158 lb 9.6 oz (71.9 kg)  08/09/24 155 lb (70.3 kg)  06/16/24 145 lb (65.8 kg)  GEN:  Well nourished, well developed in no acute distress HEENT: Normal NECK: No JVD; No carotid bruits LYMPHATICS: No lymphadenopathy CARDIAC: RRR, no murmurs, rubs, gallops RESPIRATORY:  Clear to auscultation without rales, wheezing or rhonchi  ABDOMEN: Soft, non-tender, non-distended MUSCULOSKELETAL:  No edema; No deformity  SKIN: Warm and dry NEUROLOGIC:  Alert and oriented x 3 PSYCHIATRIC:  Normal affect   ASSESSMENT:    1. Precordial pain   2. Coronary artery disease involving native coronary artery of native heart, unspecified whether angina present   3. Essential hypertension   4. Hyperlipidemia, unspecified hyperlipidemia type    PLAN:    CAD: Calcium  score 04/2024 was 212 (90th percentile).  She is reporting atypical chest pain.  Recommend coronary CTA to evaluate for obstructive CAD.  Will give Lopressor  50 mg prior to study.  Check echocardiogram to rule out structural heart disease.  Hypertension: On amlodipine  5 mg daily and spironolactone  25 mg daily.  Appears controlled  Hyperlipidemia: On rosuvastatin  20 mg daily  RTC in 3 months   Medication Adjustments/Labs and Tests Ordered: Current medicines are reviewed at length with the patient today.  Concerns regarding medicines are outlined above.  Orders Placed This Encounter  Procedures   CT CORONARY MORPH W/CTA COR W/SCORE W/CA W/CM &/OR WO/CM   Basic Metabolic Panel (BMET)   Lipid panel   ECHOCARDIOGRAM COMPLETE   Meds ordered this encounter  Medications   metoprolol  tartrate (LOPRESSOR ) 50 MG tablet    Sig: Take 2 hours before CT scan    Dispense:  1  tablet    Refill:  0    Patient Instructions  Medication Instructions:  Your physician recommends that you continue on your current medications as directed. Please refer to the Current Medication list given to you today.  *If you need a refill on your cardiac medications before your next appointment, please call your pharmacy*  Lab Work: Bmet, lipid panel today If you have labs (blood work) drawn today and your tests are completely normal, you will receive your results only by: MyChart Message (if you have MyChart) OR A paper copy in the mail If you have any lab test that is abnormal or we need to change your treatment, we will call you to review the results.  Testing/Procedures:   Your cardiac CT will be scheduled at one of the below locations:   [facility] [address] [place], [place] [address] [phone] (Severe contrast allergies only)  OR   [facility] [address] [place], [place] [address] [phone]  OR   [facility] [address] [place], [place] [address] [phone]  OR   [facility] [address]  [place], [place] [address]  OR   If scheduled at [facility], please arrive at the [facility] and Children's Entrance (Entrance C2) of [facility] 30 minutes prior to test start time. You can use the FREE valet parking offered at entrance C (encouraged to control the heart rate for the test)  Proceed to the [facility] Radiology Department (first floor) to check-in and test prep.  All radiology patients and guests should use entrance C2 at [facility], accessed from [facility], even though the hospital's physical address listed is [address]  If scheduled at the Heart and Vascular Tower at [address], please enter the parking lot using the [address] entrance and use the FREE valet  service at the patient  drop-off area. Enter the building and check-in with registration on the main floor.  w these instructions carefully (unless otherwise directed):  An IV will be required for this test and Nitroglycerin  will be given.  Hold all erectile dysfunction medications at least 3 days (72 hrs) prior to test. (Ie viagra, cialis, sildenafil, tadalafil, etc)   On the Night Before the Test: Be sure to Drink plenty of water. Do not consume any caffeinated/decaffeinated beverages or chocolate 12 hours prior to your test. Do not take any antihistamines 12 hours prior to your test.  On the Day of the Test: Drink plenty of water until 1 hour prior to the test. Do not eat any food 1 hour prior to test. You may take your regular medications prior to the test.  Take metoprolol  (Lopressor ) 50mg   two hours prior to test. If you take Furosemide/Hydrochlorothiazide/Spironolactone /Chlorthalidone , please HOLD on the morning of the test. Patients who wear a continuous glucose monitor MUST remove the device prior to scanning. FEMALES- please wear underwire-free bra if available, avoid dresses & tight clothing       After the Test: Drink plenty of water. After receiving IV contrast, you may experience a mild flushed feeling. This is normal. On occasion, you may experience a mild rash up to 24 hours after the test. This is not dangerous. If this occurs, you can take Benadryl 25 mg, Zyrtec , Claritin, or Allegra and increase your fluid intake. (Patients taking Tikosyn should avoid Benadryl, and may take Zyrtec , Claritin, or Allegra) If you experience trouble breathing, this can be serious. If it is severe call 911 IMMEDIATELY. If it is mild, please call our office.  We will call to schedule your test 2-4 weeks out understanding that some insurance companies will need an authorization prior to the service being performed.   For more information and frequently asked questions, please visit our website :  [email or web address]  For non-scheduling related questions, please contact the cardiac imaging nurse navigator should you have any questions/concerns: Cardiac Imaging Nurse Navigators Direct Office Dial: [phone]   For scheduling needs, including cancellations and rescheduling, please call Brittany, [phone].   Follow-Up: At [facility], you and your health needs are our priority.  As part of our continuing mission to provide you with exceptional heart care, our providers are all part of one team.  This team includes your primary Cardiologist (physician) and Advanced Practice Providers or APPs (Physician Assistants and Nurse Practitioners) who all work together to provide you with the care you need, when you need it.  Your next appointment:   3 months  Provider:   Dr. Kate  We recommend signing up for the patient portal called MyChart.  Sign up information is provided on this After Visit Summary.  MyChart is used to connect with patients for Virtual Visits (Telemedicine).  Patients are able to view lab/test results, encounter notes, upcoming appointments, etc.  Non-urgent messages can be sent to your provider as well.   To learn more about what you can do with MyChart, go to [email or web address].   Other Instructions Echo  Your physician has requested that you have an echocardiogram. Echocardiography is a painless test that uses sound waves to create images of your heart. It provides your doctor with information about the size and shape of your heart and how well your hearts chambers and valves are working. This procedure takes approximately one hour. There are no restrictions for this procedure. Please do NOT wear cologne,  perfume, aftershave, or lotions (deodorant is allowed). Please arrive 15 minutes prior to your appointment time.  Please note: We ask at that you not bring children with you during ultrasound (echo/ vascular) testing. Due to room  size and safety concerns, children are not allowed in the ultrasound rooms during exams. Our front office staff cannot provide observation of children in our lobby area while testing is being conducted. An adult accompanying a patient to their appointment will only be allowed in the ultrasound room at the discretion of the ultrasound technician under special circumstances. We apologize for any inconvenience.             Signed, Lonni LITTIE Nanas, MD  10/25/2024 4:14 PM    [facility]     [1]  Current Meds  Medication Sig   amLODipine  (NORVASC ) 5 MG tablet Take 1 tablet (5 mg total) by mouth daily.   aspirin  EC 81 MG tablet Take 1 tablet (81 mg total) by mouth daily.   Cholecalciferol  50 MCG (2000 UT) TABS Take 2 tablets (4,000 Units total) by mouth daily.   metoprolol  tartrate (LOPRESSOR ) 50 MG tablet Take 2 hours before CT scan   rosuvastatin  (CRESTOR ) 20 MG tablet TAKE 1 TABLET(20 MG) BY MOUTH DAILY WITH BREAKFAST   spironolactone  (ALDACTONE ) 25 MG tablet Take 1 tablet (25 mg total) by mouth daily.   "

## 2024-10-25 ENCOUNTER — Ambulatory Visit: Attending: Cardiology | Admitting: Cardiology

## 2024-10-25 VITALS — BP 138/82 | HR 74 | Ht 67.61 in | Wt 158.6 lb

## 2024-10-25 DIAGNOSIS — I10 Essential (primary) hypertension: Secondary | ICD-10-CM | POA: Diagnosis not present

## 2024-10-25 DIAGNOSIS — I2510 Atherosclerotic heart disease of native coronary artery without angina pectoris: Secondary | ICD-10-CM

## 2024-10-25 DIAGNOSIS — R072 Precordial pain: Secondary | ICD-10-CM

## 2024-10-25 DIAGNOSIS — E785 Hyperlipidemia, unspecified: Secondary | ICD-10-CM | POA: Diagnosis not present

## 2024-10-25 MED ORDER — METOPROLOL TARTRATE 50 MG PO TABS: ORAL_TABLET | ORAL | 0 refills | Status: AC

## 2024-10-25 NOTE — Patient Instructions (Signed)
 Medication Instructions:  Your physician recommends that you continue on your current medications as directed. Please refer to the Current Medication list given to you today.  *If you need a refill on your cardiac medications before your next appointment, please call your pharmacy*  Lab Work: Bmet, lipid panel today If you have labs (blood work) drawn today and your tests are completely normal, you will receive your results only by: MyChart Message (if you have MyChart) OR A paper copy in the mail If you have any lab test that is abnormal or we need to change your treatment, we will call you to review the results.  Testing/Procedures:   Your cardiac CT will be scheduled at one of the below locations:   [facility] [address] [place], [place] [address] [phone] (Severe contrast allergies only)  OR   [facility] [address] [place], [place] [address] [phone]  OR   [facility] [address] [place], [place] [address] [phone]  OR   [facility] [address]  [place], [place] [address]  OR   If scheduled at [facility], please arrive at the [facility] and Children's Entrance (Entrance C2) of [facility] 30 minutes prior to test start time. You can use the FREE valet parking offered at entrance C (encouraged to control the heart rate for the test)  Proceed to the [facility] Radiology Department (first floor) to check-in and test prep.  All radiology patients and guests should use entrance C2 at [facility], accessed from [facility], even though the hospital's physical address listed is [address]  If scheduled at the Heart and Vascular Tower at [address], please enter the parking lot using the [address] entrance and use the FREE valet service at the patient drop-off area. Enter the building and check-in with  registration on the main floor.  w these instructions carefully (unless otherwise directed):  An IV will be required for this test and Nitroglycerin  will be given.  Hold all erectile dysfunction medications at least 3 days (72 hrs) prior to test. (Ie viagra, cialis, sildenafil, tadalafil, etc)   On the Night Before the Test: Be sure to Drink plenty of water. Do not consume any caffeinated/decaffeinated beverages or chocolate 12 hours prior to your test. Do not take any antihistamines 12 hours prior to your test.  On the Day of the Test: Drink plenty of water until 1 hour prior to the test. Do not eat any food 1 hour prior to test. You may take your regular medications prior to the test.  Take metoprolol  (Lopressor ) 50mg   two hours prior to test. If you take Furosemide/Hydrochlorothiazide/Spironolactone /Chlorthalidone , please HOLD on the morning of the test. Patients who wear a continuous glucose monitor MUST remove the device prior to scanning. FEMALES- please wear underwire-free bra if available, avoid dresses & tight clothing       After the Test: Drink plenty of water. After receiving IV contrast, you may experience a mild flushed feeling. This is normal. On occasion, you may experience a mild rash up to 24 hours after the test. This is not dangerous. If this occurs, you can take Benadryl 25 mg, Zyrtec , Claritin, or Allegra and increase your fluid intake. (Patients taking Tikosyn should avoid Benadryl, and may take Zyrtec , Claritin, or Allegra) If you experience trouble breathing, this can be serious. If it is severe call 911 IMMEDIATELY.  If it is mild, please call our office.  We will call to schedule your test 2-4 weeks out understanding that some insurance companies will need an authorization prior to the service being performed.   For more information and frequently asked questions, please visit our website : [email or web address]  For non-scheduling related  questions, please contact the cardiac imaging nurse navigator should you have any questions/concerns: Cardiac Imaging Nurse Navigators Direct Office Dial: [phone]   For scheduling needs, including cancellations and rescheduling, please call Brittany, [phone].   Follow-Up: At [facility], you and your health needs are our priority.  As part of our continuing mission to provide you with exceptional heart care, our providers are all part of one team.  This team includes your primary Cardiologist (physician) and Advanced Practice Providers or APPs (Physician Assistants and Nurse Practitioners) who all work together to provide you with the care you need, when you need it.  Your next appointment:   3 months  Provider:   Dr. Kate  We recommend signing up for the patient portal called MyChart.  Sign up information is provided on this After Visit Summary.  MyChart is used to connect with patients for Virtual Visits (Telemedicine).  Patients are able to view lab/test results, encounter notes, upcoming appointments, etc.  Non-urgent messages can be sent to your provider as well.   To learn more about what you can do with MyChart, go to [email or web address].   Other Instructions Echo  Your physician has requested that you have an echocardiogram. Echocardiography is a painless test that uses sound waves to create images of your heart. It provides your doctor with information about the size and shape of your heart and how well your hearts chambers and valves are working. This procedure takes approximately one hour. There are no restrictions for this procedure. Please do NOT wear cologne, perfume, aftershave, or lotions (deodorant is allowed). Please arrive 15 minutes prior to your appointment time.  Please note: We ask at that you not bring children with you during ultrasound (echo/ vascular) testing. Due to room size and safety concerns, children are not allowed in the  ultrasound rooms during exams. Our front office staff cannot provide observation of children in our lobby area while testing is being conducted. An adult accompanying a patient to their appointment will only be allowed in the ultrasound room at the discretion of the ultrasound technician under special circumstances. We apologize for any inconvenience.

## 2024-10-26 LAB — BASIC METABOLIC PANEL WITH GFR
BUN/Creatinine Ratio: 15 (ref 12–28)
BUN: 14 mg/dL (ref 8–27)
CO2: 19 mmol/L — ABNORMAL LOW (ref 20–29)
Calcium: 10.1 mg/dL (ref 8.7–10.3)
Chloride: 103 mmol/L (ref 96–106)
Creatinine, Ser: 0.96 mg/dL (ref 0.57–1.00)
Glucose: 109 mg/dL — ABNORMAL HIGH (ref 70–99)
Potassium: 4.7 mmol/L (ref 3.5–5.2)
Sodium: 140 mmol/L (ref 134–144)
eGFR: 64 mL/min/1.73

## 2024-10-26 LAB — LIPID PANEL
Chol/HDL Ratio: 4 ratio (ref 0.0–4.4)
Cholesterol, Total: 263 mg/dL — ABNORMAL HIGH (ref 100–199)
HDL: 65 mg/dL
LDL Chol Calc (NIH): 175 mg/dL — ABNORMAL HIGH (ref 0–99)
Triglycerides: 128 mg/dL (ref 0–149)
VLDL Cholesterol Cal: 23 mg/dL (ref 5–40)

## 2024-10-27 ENCOUNTER — Ambulatory Visit: Payer: Self-pay | Admitting: Cardiology

## 2024-10-27 DIAGNOSIS — E785 Hyperlipidemia, unspecified: Secondary | ICD-10-CM

## 2024-11-02 ENCOUNTER — Other Ambulatory Visit

## 2024-11-02 ENCOUNTER — Encounter

## 2024-11-02 DIAGNOSIS — I10 Essential (primary) hypertension: Secondary | ICD-10-CM

## 2024-11-02 LAB — BASIC METABOLIC PANEL WITH GFR
BUN: 13 mg/dL (ref 6–23)
CO2: 29 meq/L (ref 19–32)
Calcium: 9.8 mg/dL (ref 8.4–10.5)
Chloride: 103 meq/L (ref 96–112)
Creatinine, Ser: 0.84 mg/dL (ref 0.40–1.20)
GFR: 71.45 mL/min
Glucose, Bld: 116 mg/dL — ABNORMAL HIGH (ref 70–99)
Potassium: 3.9 meq/L (ref 3.5–5.1)
Sodium: 138 meq/L (ref 135–145)

## 2024-11-02 NOTE — Progress Notes (Signed)
 This encounter was created in error - please disregard. Patient walked out before I completed the visit

## 2024-11-02 NOTE — Patient Instructions (Signed)
 SABRA

## 2024-11-03 ENCOUNTER — Ambulatory Visit: Payer: Self-pay | Admitting: Pharmacist

## 2024-11-06 ENCOUNTER — Other Ambulatory Visit: Payer: Self-pay

## 2024-11-06 DIAGNOSIS — E785 Hyperlipidemia, unspecified: Secondary | ICD-10-CM

## 2024-11-08 ENCOUNTER — Ambulatory Visit (HOSPITAL_COMMUNITY)

## 2024-11-10 ENCOUNTER — Other Ambulatory Visit: Payer: Self-pay | Admitting: Internal Medicine

## 2024-11-10 DIAGNOSIS — E876 Hypokalemia: Secondary | ICD-10-CM

## 2024-11-10 DIAGNOSIS — I10 Essential (primary) hypertension: Secondary | ICD-10-CM

## 2024-11-10 DIAGNOSIS — E785 Hyperlipidemia, unspecified: Secondary | ICD-10-CM

## 2024-11-13 ENCOUNTER — Ambulatory Visit: Admitting: Internal Medicine

## 2024-11-15 ENCOUNTER — Ambulatory Visit (HOSPITAL_COMMUNITY)

## 2025-01-22 ENCOUNTER — Ambulatory Visit (HOSPITAL_COMMUNITY)

## 2025-01-29 ENCOUNTER — Ambulatory Visit: Admitting: Cardiology

## 2025-04-27 ENCOUNTER — Other Ambulatory Visit

## 2025-04-27 ENCOUNTER — Ambulatory Visit: Admitting: Hematology and Oncology
